# Patient Record
Sex: Female | Born: 1955
Health system: Southern US, Community
[De-identification: ages and names within clinical notes are randomized; demographics above are authoritative.]

## PROBLEM LIST (undated history)

## (undated) DIAGNOSIS — I10 Essential (primary) hypertension: Secondary | ICD-10-CM

## (undated) DIAGNOSIS — R918 Other nonspecific abnormal finding of lung field: Secondary | ICD-10-CM

## (undated) DIAGNOSIS — F1911 Other psychoactive substance abuse, in remission: Secondary | ICD-10-CM

## (undated) DIAGNOSIS — R232 Flushing: Secondary | ICD-10-CM

## (undated) DIAGNOSIS — F419 Anxiety disorder, unspecified: Secondary | ICD-10-CM

## (undated) DIAGNOSIS — C50919 Malignant neoplasm of unspecified site of unspecified female breast: Secondary | ICD-10-CM

## (undated) DIAGNOSIS — N809 Endometriosis, unspecified: Secondary | ICD-10-CM

## (undated) DIAGNOSIS — K760 Fatty (change of) liver, not elsewhere classified: Secondary | ICD-10-CM

## (undated) DIAGNOSIS — J449 Chronic obstructive pulmonary disease, unspecified: Secondary | ICD-10-CM

## (undated) DIAGNOSIS — B1920 Unspecified viral hepatitis C without hepatic coma: Secondary | ICD-10-CM

## (undated) DIAGNOSIS — T451X5A Adverse effect of antineoplastic and immunosuppressive drugs, initial encounter: Secondary | ICD-10-CM

## (undated) DIAGNOSIS — K633 Ulcer of intestine: Secondary | ICD-10-CM

## (undated) DIAGNOSIS — G629 Polyneuropathy, unspecified: Secondary | ICD-10-CM

## (undated) DIAGNOSIS — F32A Depression, unspecified: Secondary | ICD-10-CM

## (undated) DIAGNOSIS — Z8619 Personal history of other infectious and parasitic diseases: Secondary | ICD-10-CM

## (undated) DIAGNOSIS — J45909 Unspecified asthma, uncomplicated: Secondary | ICD-10-CM

## (undated) DIAGNOSIS — R9389 Abnormal findings on diagnostic imaging of other specified body structures: Secondary | ICD-10-CM

## (undated) HISTORY — DX: Polyneuropathy, unspecified: G62.9

## (undated) HISTORY — DX: Other nonspecific abnormal finding of lung field: R91.8

## (undated) HISTORY — DX: Chronic obstructive pulmonary disease, unspecified: J44.9

## (undated) HISTORY — DX: Other psychoactive substance abuse, in remission: F19.11

## (undated) HISTORY — DX: Endometriosis, unspecified: N80.9

## (undated) HISTORY — DX: Unspecified viral hepatitis C without hepatic coma: B19.20

## (undated) HISTORY — PX: OTHER SURGICAL HISTORY: SHX169

## (undated) HISTORY — DX: Flushing: R23.2

## (undated) HISTORY — DX: Abnormal findings on diagnostic imaging of other specified body structures: R93.89

## (undated) HISTORY — DX: Adverse effect of antineoplastic and immunosuppressive drugs, initial encounter: T45.1X5A

## (undated) HISTORY — DX: Anxiety disorder, unspecified: F41.9

## (undated) HISTORY — DX: Fatty (change of) liver, not elsewhere classified: K76.0

---

## 1994-07-08 HISTORY — PX: TUBAL LIGATION: SHX77

## 1996-07-08 DIAGNOSIS — B1920 Unspecified viral hepatitis C without hepatic coma: Secondary | ICD-10-CM

## 1996-07-08 HISTORY — DX: Unspecified viral hepatitis C without hepatic coma: B19.20

## 1999-07-09 HISTORY — PX: VAGINAL HYSTERECTOMY: SUR661

## 2011-05-27 ENCOUNTER — Ambulatory Visit: Payer: Self-pay

## 2011-05-29 ENCOUNTER — Ambulatory Visit: Payer: Self-pay

## 2011-06-14 ENCOUNTER — Ambulatory Visit: Payer: Self-pay | Admitting: Surgery

## 2011-06-18 ENCOUNTER — Ambulatory Visit: Payer: Self-pay | Admitting: Surgery

## 2011-06-18 DIAGNOSIS — C50919 Malignant neoplasm of unspecified site of unspecified female breast: Secondary | ICD-10-CM

## 2011-06-18 HISTORY — DX: Malignant neoplasm of unspecified site of unspecified female breast: C50.919

## 2011-06-25 ENCOUNTER — Ambulatory Visit: Payer: Self-pay | Admitting: Internal Medicine

## 2011-06-27 LAB — PATHOLOGY REPORT

## 2011-06-28 ENCOUNTER — Ambulatory Visit: Payer: Self-pay | Admitting: Internal Medicine

## 2011-07-09 ENCOUNTER — Ambulatory Visit: Payer: Self-pay | Admitting: Internal Medicine

## 2011-07-09 HISTORY — PX: BREAST LUMPECTOMY: SHX2

## 2011-07-15 ENCOUNTER — Ambulatory Visit: Payer: Self-pay | Admitting: Surgery

## 2011-08-02 LAB — CBC CANCER CENTER
Basophil #: 0 x10 3/mm (ref 0.0–0.1)
Eosinophil #: 0.2 x10 3/mm (ref 0.0–0.7)
Lymphocyte %: 26.5 %
MCHC: 34.5 g/dL (ref 32.0–36.0)
Neutrophil %: 65.4 %
RBC: 3.99 10*6/uL (ref 3.80–5.20)
RDW: 14.2 % (ref 11.5–14.5)

## 2011-08-02 LAB — HEPATIC FUNCTION PANEL A (ARMC)
Albumin: 4 g/dL (ref 3.4–5.0)
Bilirubin, Direct: 0.1 mg/dL (ref 0.00–0.20)
Bilirubin,Total: 0.4 mg/dL (ref 0.2–1.0)

## 2011-08-02 LAB — CREATININE, SERUM
Creatinine: 0.94 mg/dL (ref 0.60–1.30)
EGFR (African American): >60
EGFR (Non-African Amer.): >60

## 2011-08-09 ENCOUNTER — Ambulatory Visit: Payer: Self-pay | Admitting: Internal Medicine

## 2011-08-09 LAB — CBC CANCER CENTER
Basophil #: 0 x10 3/mm (ref 0.0–0.1)
Basophil %: 0.9 %
Eosinophil %: 8.1 %
HCT: 36.4 % (ref 35.0–47.0)
HGB: 12.8 g/dL (ref 12.0–16.0)
Lymphocyte #: 0.9 x10 3/mm — ABNORMAL LOW (ref 1.0–3.6)
MCH: 34.4 pg — ABNORMAL HIGH (ref 26.0–34.0)
MCV: 98 fL (ref 80–100)
Monocyte #: 0.1 x10 3/mm (ref 0.0–0.7)
Neutrophil #: 0.4 x10 3/mm — ABNORMAL LOW (ref 1.4–6.5)
Neutrophil %: 27.3 %
RBC: 3.71 10*6/uL — ABNORMAL LOW (ref 3.80–5.20)

## 2011-08-16 LAB — BASIC METABOLIC PANEL
Anion Gap: 7 (ref 7–16)
BUN: 8 mg/dL (ref 7–18)
Calcium, Total: 8.6 mg/dL (ref 8.5–10.1)
Chloride: 103 mmol/L (ref 98–107)
Co2: 31 mmol/L (ref 21–32)
Osmolality: 281 (ref 275–301)
Sodium: 141 mmol/L (ref 136–145)

## 2011-08-16 LAB — HEPATIC FUNCTION PANEL A (ARMC)
Albumin: 3.6 g/dL (ref 3.4–5.0)
Alkaline Phosphatase: 105 U/L (ref 50–136)
SGOT(AST): 18 U/L (ref 15–37)
Total Protein: 7.1 g/dL (ref 6.4–8.2)

## 2011-08-16 LAB — CBC CANCER CENTER
Basophil #: 0 x10 3/mm (ref 0.0–0.1)
Eosinophil #: 0 x10 3/mm (ref 0.0–0.7)
Lymphocyte #: 1.3 x10 3/mm (ref 1.0–3.6)
Lymphocyte %: 22.4 %
Monocyte #: 0.5 x10 3/mm (ref 0.0–0.7)
Monocyte %: 8.6 %
Neutrophil %: 67.8 %
Platelet: 261 x10 3/mm (ref 150–440)
RBC: 3.35 10*6/uL — ABNORMAL LOW (ref 3.80–5.20)
RDW: 14.3 % (ref 11.5–14.5)
WBC: 5.7 x10 3/mm (ref 3.6–11.0)

## 2011-08-23 LAB — CBC CANCER CENTER
Eosinophil #: 0 x10 3/mm (ref 0.0–0.7)
Eosinophil %: 1.1 %
Lymphocyte #: 0.6 x10 3/mm — ABNORMAL LOW (ref 1.0–3.6)
MCH: 34.5 pg — ABNORMAL HIGH (ref 26.0–34.0)
MCHC: 35.2 g/dL (ref 32.0–36.0)
MCV: 98 fL (ref 80–100)
Neutrophil #: 0.2 x10 3/mm — ABNORMAL LOW (ref 1.4–6.5)
Neutrophil %: 23.4 %
Platelet: 138 x10 3/mm — ABNORMAL LOW (ref 150–440)
RBC: 3.5 10*6/uL — ABNORMAL LOW (ref 3.80–5.20)
WBC: 1 x10 3/mm — CL (ref 3.6–11.0)

## 2011-08-30 LAB — BASIC METABOLIC PANEL
BUN: 9 mg/dL (ref 7–18)
Calcium, Total: 8.9 mg/dL (ref 8.5–10.1)
Chloride: 105 mmol/L (ref 98–107)
EGFR (Non-African Amer.): >60
Osmolality: 279 (ref 275–301)
Potassium: 3.8 mmol/L (ref 3.5–5.1)
Sodium: 140 mmol/L (ref 136–145)

## 2011-08-30 LAB — CBC CANCER CENTER
Basophil #: 0.1 x10 3/mm (ref 0.0–0.1)
Basophil %: 0.6 %
Eosinophil #: 0 x10 3/mm (ref 0.0–0.7)
Eosinophil %: 0.2 %
HCT: 32.4 % — ABNORMAL LOW (ref 35.0–47.0)
HGB: 11.3 g/dL — ABNORMAL LOW (ref 12.0–16.0)
Lymphocyte %: 15.3 %
MCH: 34.4 pg — ABNORMAL HIGH (ref 26.0–34.0)
MCHC: 34.9 g/dL (ref 32.0–36.0)
Monocyte #: 0.8 x10 3/mm — ABNORMAL HIGH (ref 0.0–0.7)
Monocyte %: 10.2 %
RDW: 15.1 % — ABNORMAL HIGH (ref 11.5–14.5)
WBC: 8.1 x10 3/mm (ref 3.6–11.0)

## 2011-08-30 LAB — HEPATIC FUNCTION PANEL A (ARMC)
Bilirubin, Direct: 0 mg/dL (ref 0.00–0.20)
Bilirubin,Total: 0.2 mg/dL (ref 0.2–1.0)
SGOT(AST): 21 U/L (ref 15–37)
Total Protein: 7.5 g/dL (ref 6.4–8.2)

## 2011-09-06 ENCOUNTER — Ambulatory Visit: Payer: Self-pay | Admitting: Internal Medicine

## 2011-09-06 LAB — CBC CANCER CENTER
Basophil #: 0 x10 3/mm (ref 0.0–0.1)
Basophil %: 0.2 %
Eosinophil #: 0 x10 3/mm (ref 0.0–0.7)
Eosinophil %: 0.3 %
HCT: 36.5 % (ref 35.0–47.0)
HGB: 12.6 g/dL (ref 12.0–16.0)
Lymphocyte %: 16.9 %
MCV: 101 fL — ABNORMAL HIGH (ref 80–100)
Monocyte %: 9.8 %
Neutrophil #: 5.9 x10 3/mm (ref 1.4–6.5)
RBC: 3.6 10*6/uL — ABNORMAL LOW (ref 3.80–5.20)
RDW: 18.9 % — ABNORMAL HIGH (ref 11.5–14.5)
WBC: 8.1 x10 3/mm (ref 3.6–11.0)

## 2011-09-13 LAB — CBC CANCER CENTER
Basophil #: 0 x10 3/mm (ref 0.0–0.1)
Basophil %: 0.6 %
Eosinophil #: 0 x10 3/mm (ref 0.0–0.7)
HCT: 32.1 % — ABNORMAL LOW (ref 35.0–47.0)
HGB: 11.3 g/dL — ABNORMAL LOW (ref 12.0–16.0)
Lymphocyte #: 0.5 x10 3/mm — ABNORMAL LOW (ref 1.0–3.6)
Lymphocyte %: 26.7 %
MCHC: 35.2 g/dL (ref 32.0–36.0)
Monocyte %: 11.4 %
Neutrophil #: 1.1 x10 3/mm — ABNORMAL LOW (ref 1.4–6.5)
WBC: 1.8 x10 3/mm — CL (ref 3.6–11.0)

## 2011-09-18 LAB — CBC CANCER CENTER
Basophil #: 0.1 x10 3/mm (ref 0.0–0.1)
Eosinophil #: 0.1 x10 3/mm (ref 0.0–0.7)
HCT: 31.5 % — ABNORMAL LOW (ref 35.0–47.0)
HGB: 11.1 g/dL — ABNORMAL LOW (ref 12.0–16.0)
Lymphocyte #: 0.8 x10 3/mm — ABNORMAL LOW (ref 1.0–3.6)
Lymphocyte %: 11.5 %
MCHC: 35.4 g/dL (ref 32.0–36.0)
Monocyte #: 0.6 x10 3/mm (ref 0.0–0.7)
Monocyte %: 9.4 %
Neutrophil #: 5.2 x10 3/mm (ref 1.4–6.5)
Neutrophil %: 76.9 %
RBC: 3.08 10*6/uL — ABNORMAL LOW (ref 3.80–5.20)
RDW: 17.8 % — ABNORMAL HIGH (ref 11.5–14.5)
WBC: 6.7 x10 3/mm (ref 3.6–11.0)

## 2011-09-27 LAB — BASIC METABOLIC PANEL
Anion Gap: 7 (ref 7–16)
Calcium, Total: 8.8 mg/dL (ref 8.5–10.1)
Chloride: 105 mmol/L (ref 98–107)
Co2: 29 mmol/L (ref 21–32)
Creatinine: 0.91 mg/dL (ref 0.60–1.30)
EGFR (African American): >60
EGFR (Non-African Amer.): >60
Glucose: 100 mg/dL — ABNORMAL HIGH (ref 65–99)
Osmolality: 280 (ref 275–301)
Potassium: 4 mmol/L (ref 3.5–5.1)
Sodium: 141 mmol/L (ref 136–145)

## 2011-09-27 LAB — CBC CANCER CENTER
Basophil #: 0 x10 3/mm (ref 0.0–0.1)
Eosinophil %: 0.9 %
HCT: 33.3 % — ABNORMAL LOW (ref 35.0–47.0)
HGB: 11.6 g/dL — ABNORMAL LOW (ref 12.0–16.0)
Lymphocyte #: 0.9 x10 3/mm — ABNORMAL LOW (ref 1.0–3.6)
Lymphocyte %: 14.8 %
MCH: 36.3 pg — ABNORMAL HIGH (ref 26.0–34.0)
MCHC: 34.8 g/dL (ref 32.0–36.0)
Monocyte #: 0.9 x10 3/mm — ABNORMAL HIGH (ref 0.0–0.7)
Neutrophil %: 69.6 %
Platelet: 297 x10 3/mm (ref 150–440)
RBC: 3.19 10*6/uL — ABNORMAL LOW (ref 3.80–5.20)
RDW: 19.7 % — ABNORMAL HIGH (ref 11.5–14.5)

## 2011-10-03 LAB — CBC CANCER CENTER
Basophil #: 0 x10 3/mm (ref 0.0–0.1)
Eosinophil #: 0 x10 3/mm (ref 0.0–0.7)
HGB: 11.8 g/dL — ABNORMAL LOW (ref 12.0–16.0)
Lymphocyte %: 19.9 %
MCH: 36.8 pg — ABNORMAL HIGH (ref 26.0–34.0)
MCHC: 35 g/dL (ref 32.0–36.0)
Monocyte #: 0 x10 3/mm (ref 0.0–0.7)
Monocyte %: 1 %
Neutrophil #: 1.8 x10 3/mm (ref 1.4–6.5)
Neutrophil %: 78.2 %
Platelet: 98 x10 3/mm — ABNORMAL LOW (ref 150–440)
RDW: 17.7 % — ABNORMAL HIGH (ref 11.5–14.5)

## 2011-10-07 ENCOUNTER — Ambulatory Visit: Payer: Self-pay | Admitting: Internal Medicine

## 2011-10-11 LAB — CBC CANCER CENTER
Basophil %: 0.2 %
Eosinophil #: 0 x10 3/mm (ref 0.0–0.7)
Eosinophil %: 1 %
HCT: 28.5 % — ABNORMAL LOW (ref 35.0–47.0)
MCHC: 35.1 g/dL (ref 32.0–36.0)
Neutrophil #: 3.1 x10 3/mm (ref 1.4–6.5)
Neutrophil %: 71.1 %
WBC: 4.4 x10 3/mm (ref 3.6–11.0)

## 2011-10-18 LAB — CBC CANCER CENTER
Basophil %: 0.6 %
Eosinophil #: 0 x10 3/mm (ref 0.0–0.7)
Eosinophil %: 0.5 %
MCHC: 32.9 g/dL (ref 32.0–36.0)
Monocyte #: 0.6 x10 3/mm (ref 0.2–0.9)
Monocyte %: 12.7 %
Platelet: 307 x10 3/mm (ref 150–440)
RDW: 18.6 % — ABNORMAL HIGH (ref 11.5–14.5)

## 2011-10-18 LAB — BASIC METABOLIC PANEL
Anion Gap: 6 — ABNORMAL LOW (ref 7–16)
Co2: 31 mmol/L (ref 21–32)
Creatinine: 0.84 mg/dL (ref 0.60–1.30)
Glucose: 148 mg/dL — ABNORMAL HIGH (ref 65–99)
Osmolality: 282 (ref 275–301)
Sodium: 141 mmol/L (ref 136–145)

## 2011-11-01 LAB — CBC CANCER CENTER
Eosinophil #: 0 x10 3/mm (ref 0.0–0.7)
Eosinophil %: 0 %
HCT: 37.8 % (ref 35.0–47.0)
Lymphocyte #: 0.3 x10 3/mm — ABNORMAL LOW (ref 1.0–3.6)
MCHC: 33.2 g/dL (ref 32.0–36.0)
Monocyte %: 1.2 %
Neutrophil #: 8.4 x10 3/mm — ABNORMAL HIGH (ref 1.4–6.5)
RDW: 15.1 % — ABNORMAL HIGH (ref 11.5–14.5)
WBC: 8.8 x10 3/mm (ref 3.6–11.0)

## 2011-11-06 ENCOUNTER — Ambulatory Visit: Payer: Self-pay | Admitting: Internal Medicine

## 2011-11-08 LAB — BASIC METABOLIC PANEL
Anion Gap: 6 — ABNORMAL LOW (ref 7–16)
BUN: 7 mg/dL (ref 7–18)
Chloride: 103 mmol/L (ref 98–107)
Co2: 32 mmol/L (ref 21–32)
Osmolality: 282 (ref 275–301)
Potassium: 4.2 mmol/L (ref 3.5–5.1)
Sodium: 141 mmol/L (ref 136–145)

## 2011-11-08 LAB — CBC CANCER CENTER
Basophil #: 0 x10 3/mm (ref 0.0–0.1)
Basophil %: 1 %
Eosinophil #: 0.1 x10 3/mm (ref 0.0–0.7)
Eosinophil %: 3.8 %
HGB: 12.6 g/dL (ref 12.0–16.0)
Lymphocyte %: 18 %
MCV: 109 fL — ABNORMAL HIGH (ref 80–100)
Monocyte #: 0.3 x10 3/mm (ref 0.2–0.9)
Neutrophil #: 2.5 x10 3/mm (ref 1.4–6.5)
Neutrophil %: 69.3 %
RBC: 3.4 10*6/uL — ABNORMAL LOW (ref 3.80–5.20)
WBC: 3.6 x10 3/mm (ref 3.6–11.0)

## 2011-11-15 LAB — CBC CANCER CENTER
Eosinophil #: 0.1 x10 3/mm (ref 0.0–0.7)
Eosinophil %: 2.7 %
HCT: 36.6 % (ref 35.0–47.0)
HGB: 12.3 g/dL (ref 12.0–16.0)
Lymphocyte #: 0.4 x10 3/mm — ABNORMAL LOW (ref 1.0–3.6)
MCH: 36.6 pg — ABNORMAL HIGH (ref 26.0–34.0)
MCV: 109 fL — ABNORMAL HIGH (ref 80–100)
Monocyte #: 0.1 x10 3/mm — ABNORMAL LOW (ref 0.2–0.9)
Monocyte %: 4.2 %
Neutrophil #: 2.9 x10 3/mm (ref 1.4–6.5)
Neutrophil %: 81.6 %
RBC: 3.36 10*6/uL — ABNORMAL LOW (ref 3.80–5.20)

## 2011-11-25 LAB — CBC CANCER CENTER
Basophil #: 0 x10 3/mm (ref 0.0–0.1)
Eosinophil %: 2.3 %
HGB: 11.8 g/dL — ABNORMAL LOW (ref 12.0–16.0)
Lymphocyte %: 20.9 %
MCHC: 33.3 g/dL (ref 32.0–36.0)
Monocyte #: 0.8 x10 3/mm (ref 0.2–0.9)
Monocyte %: 23.5 %
Neutrophil #: 1.8 x10 3/mm (ref 1.4–6.5)
Neutrophil %: 52.7 %
Platelet: 244 x10 3/mm (ref 150–440)
RBC: 3.3 10*6/uL — ABNORMAL LOW (ref 3.80–5.20)
WBC: 3.5 x10 3/mm — ABNORMAL LOW (ref 3.6–11.0)

## 2011-12-06 LAB — CBC CANCER CENTER
Basophil #: 0 x10 3/mm (ref 0.0–0.1)
Eosinophil #: 0.1 x10 3/mm (ref 0.0–0.7)
Eosinophil %: 1.6 %
HCT: 35.4 % (ref 35.0–47.0)
Lymphocyte #: 0.8 x10 3/mm — ABNORMAL LOW (ref 1.0–3.6)
MCH: 36 pg — ABNORMAL HIGH (ref 26.0–34.0)
MCHC: 33.7 g/dL (ref 32.0–36.0)
MCV: 107 fL — ABNORMAL HIGH (ref 80–100)
Monocyte #: 0.7 x10 3/mm (ref 0.2–0.9)
Monocyte %: 15.7 %
Neutrophil #: 3.1 x10 3/mm (ref 1.4–6.5)
Neutrophil %: 64.9 %
Platelet: 282 x10 3/mm (ref 150–440)
RBC: 3.32 10*6/uL — ABNORMAL LOW (ref 3.80–5.20)
RDW: 13.7 % (ref 11.5–14.5)
WBC: 4.8 x10 3/mm (ref 3.6–11.0)

## 2011-12-06 LAB — COMPREHENSIVE METABOLIC PANEL
Albumin: 3.5 g/dL (ref 3.4–5.0)
Alkaline Phosphatase: 70 U/L (ref 50–136)
Anion Gap: 8 (ref 7–16)
Bilirubin,Total: 0.3 mg/dL (ref 0.2–1.0)
Calcium, Total: 9.5 mg/dL (ref 8.5–10.1)
Chloride: 98 mmol/L (ref 98–107)
Creatinine: 0.89 mg/dL (ref 0.60–1.30)
EGFR (African American): >60
EGFR (Non-African Amer.): >60
Sodium: 136 mmol/L (ref 136–145)
Total Protein: 7.4 g/dL (ref 6.4–8.2)

## 2011-12-07 ENCOUNTER — Ambulatory Visit: Payer: Self-pay | Admitting: Internal Medicine

## 2011-12-13 LAB — CBC CANCER CENTER
Basophil %: 2.5 %
Eosinophil #: 0.1 x10 3/mm (ref 0.0–0.7)
HCT: 37.1 % (ref 35.0–47.0)
HGB: 12.5 g/dL (ref 12.0–16.0)
Lymphocyte #: 0.7 x10 3/mm — ABNORMAL LOW (ref 1.0–3.6)
MCV: 107 fL — ABNORMAL HIGH (ref 80–100)
Monocyte #: 0.4 x10 3/mm (ref 0.2–0.9)
Monocyte %: 9.8 %
Neutrophil %: 67.3 %
RBC: 3.49 10*6/uL — ABNORMAL LOW (ref 3.80–5.20)
WBC: 4.1 x10 3/mm (ref 3.6–11.0)

## 2011-12-20 LAB — CBC CANCER CENTER
Basophil #: 0 x10 3/mm (ref 0.0–0.1)
Basophil %: 0.7 %
Eosinophil #: 0.1 x10 3/mm (ref 0.0–0.7)
HCT: 37.4 % (ref 35.0–47.0)
HGB: 12.5 g/dL (ref 12.0–16.0)
Lymphocyte #: 0.7 x10 3/mm — ABNORMAL LOW (ref 1.0–3.6)
Lymphocyte %: 18.4 %
MCH: 35.7 pg — ABNORMAL HIGH (ref 26.0–34.0)
MCHC: 33.4 g/dL (ref 32.0–36.0)
MCV: 107 fL — ABNORMAL HIGH (ref 80–100)
Monocyte %: 6.8 %
Neutrophil #: 2.9 x10 3/mm (ref 1.4–6.5)
Neutrophil %: 72.6 %
Platelet: 228 x10 3/mm (ref 150–440)
RBC: 3.51 10*6/uL — ABNORMAL LOW (ref 3.80–5.20)
RDW: 14 % (ref 11.5–14.5)

## 2011-12-27 LAB — CBC CANCER CENTER
Basophil #: 0 x10 3/mm (ref 0.0–0.1)
Eosinophil %: 1.2 %
HCT: 36.8 % (ref 35.0–47.0)
Lymphocyte #: 0.9 x10 3/mm — ABNORMAL LOW (ref 1.0–3.6)
Lymphocyte %: 18.9 %
MCH: 35.6 pg — ABNORMAL HIGH (ref 26.0–34.0)
MCV: 106 fL — ABNORMAL HIGH (ref 80–100)
Monocyte #: 0.5 x10 3/mm (ref 0.2–0.9)
Neutrophil #: 3.3 x10 3/mm (ref 1.4–6.5)
Platelet: 232 x10 3/mm (ref 150–440)
RBC: 3.46 10*6/uL — ABNORMAL LOW (ref 3.80–5.20)
WBC: 4.8 x10 3/mm (ref 3.6–11.0)

## 2011-12-27 LAB — BASIC METABOLIC PANEL
Calcium, Total: 9.4 mg/dL (ref 8.5–10.1)
Chloride: 102 mmol/L (ref 98–107)
Co2: 28 mmol/L (ref 21–32)
EGFR (Non-African Amer.): >60
Glucose: 110 mg/dL — ABNORMAL HIGH (ref 65–99)

## 2011-12-27 LAB — HEPATIC FUNCTION PANEL A (ARMC)
Albumin: 3.8 g/dL (ref 3.4–5.0)
Alkaline Phosphatase: 85 U/L (ref 50–136)
Bilirubin, Direct: 0.1 mg/dL (ref 0.00–0.20)
Bilirubin,Total: 0.3 mg/dL (ref 0.2–1.0)

## 2012-01-03 LAB — CBC CANCER CENTER
Basophil %: 0.7 %
Eosinophil #: 0 x10 3/mm (ref 0.0–0.7)
HCT: 35.9 % (ref 35.0–47.0)
HGB: 12.2 g/dL (ref 12.0–16.0)
Lymphocyte %: 20.4 %
MCHC: 33.9 g/dL (ref 32.0–36.0)
MCV: 105 fL — ABNORMAL HIGH (ref 80–100)
Monocyte #: 0.4 x10 3/mm (ref 0.2–0.9)
Monocyte %: 8.3 %
Neutrophil #: 3.7 x10 3/mm (ref 1.4–6.5)
RBC: 3.4 10*6/uL — ABNORMAL LOW (ref 3.80–5.20)
RDW: 15.1 % — ABNORMAL HIGH (ref 11.5–14.5)
WBC: 5.4 x10 3/mm (ref 3.6–11.0)

## 2012-01-06 ENCOUNTER — Ambulatory Visit: Payer: Self-pay | Admitting: Internal Medicine

## 2012-01-13 LAB — CBC CANCER CENTER
Basophil %: 0.4 %
Eosinophil %: 0.4 %
HCT: 32.8 % — ABNORMAL LOW (ref 35.0–47.0)
HGB: 11.3 g/dL — ABNORMAL LOW (ref 12.0–16.0)
Lymphocyte #: 0.7 x10 3/mm — ABNORMAL LOW (ref 1.0–3.6)
MCH: 37.2 pg — ABNORMAL HIGH (ref 26.0–34.0)
MCHC: 34.4 g/dL (ref 32.0–36.0)
MCV: 108 fL — ABNORMAL HIGH (ref 80–100)
Monocyte #: 0.5 x10 3/mm (ref 0.2–0.9)
Monocyte %: 18 %
Neutrophil #: 1.6 x10 3/mm (ref 1.4–6.5)
Platelet: 242 x10 3/mm (ref 150–440)
RBC: 3.04 10*6/uL — ABNORMAL LOW (ref 3.80–5.20)
WBC: 2.8 x10 3/mm — ABNORMAL LOW (ref 3.6–11.0)

## 2012-01-20 LAB — CBC CANCER CENTER
Basophil #: 0 x10 3/mm (ref 0.0–0.1)
Basophil %: 0.7 %
Eosinophil #: 0.1 x10 3/mm (ref 0.0–0.7)
Eosinophil %: 1.2 %
HGB: 12.4 g/dL (ref 12.0–16.0)
Lymphocyte #: 1.1 x10 3/mm (ref 1.0–3.6)
Lymphocyte %: 19.9 %
MCHC: 33.7 g/dL (ref 32.0–36.0)
MCV: 109 fL — ABNORMAL HIGH (ref 80–100)
Monocyte %: 8.9 %
Neutrophil %: 69.3 %
RBC: 3.38 10*6/uL — ABNORMAL LOW (ref 3.80–5.20)
WBC: 5.3 x10 3/mm (ref 3.6–11.0)

## 2012-01-20 LAB — HEPATIC FUNCTION PANEL A (ARMC)
Albumin: 3.6 g/dL (ref 3.4–5.0)
Bilirubin,Total: 0.2 mg/dL (ref 0.2–1.0)
SGOT(AST): 31 U/L (ref 15–37)
SGPT (ALT): 40 U/L
Total Protein: 7.2 g/dL (ref 6.4–8.2)

## 2012-02-06 ENCOUNTER — Ambulatory Visit: Payer: Self-pay | Admitting: Internal Medicine

## 2012-02-19 LAB — CBC CANCER CENTER
Basophil %: 0.9 %
Eosinophil #: 0.2 x10 3/mm (ref 0.0–0.7)
Eosinophil %: 2.1 %
Lymphocyte %: 13.6 %
MCH: 37.1 pg — ABNORMAL HIGH (ref 26.0–34.0)
MCHC: 34 g/dL (ref 32.0–36.0)
MCV: 109 fL — ABNORMAL HIGH (ref 80–100)
Monocyte %: 8.6 %
Neutrophil #: 5.8 x10 3/mm (ref 1.4–6.5)
Neutrophil %: 74.8 %
Platelet: 295 x10 3/mm (ref 150–440)
RDW: 15.5 % — ABNORMAL HIGH (ref 11.5–14.5)
WBC: 7.7 x10 3/mm (ref 3.6–11.0)

## 2012-02-19 LAB — COMPREHENSIVE METABOLIC PANEL
Albumin: 3.9 g/dL (ref 3.4–5.0)
Anion Gap: 6 — ABNORMAL LOW (ref 7–16)
BUN: 11 mg/dL (ref 7–18)
Chloride: 104 mmol/L (ref 98–107)
Creatinine: 0.9 mg/dL (ref 0.60–1.30)
EGFR (African American): >60
EGFR (Non-African Amer.): >60
Osmolality: 276 (ref 275–301)
Potassium: 4.3 mmol/L (ref 3.5–5.1)
SGOT(AST): 31 U/L (ref 15–37)
Sodium: 138 mmol/L (ref 136–145)
Total Protein: 7.9 g/dL (ref 6.4–8.2)

## 2012-02-25 LAB — CBC CANCER CENTER
Basophil #: 0.1 x10 3/mm (ref 0.0–0.1)
Basophil %: 1.3 %
Eosinophil %: 2.3 %
HCT: 41.7 % (ref 35.0–47.0)
HGB: 14.2 g/dL (ref 12.0–16.0)
Lymphocyte %: 13.1 %
MCH: 37.6 pg — ABNORMAL HIGH (ref 26.0–34.0)
MCHC: 34.1 g/dL (ref 32.0–36.0)
Monocyte %: 6.7 %
Neutrophil %: 76.6 %
Platelet: 249 x10 3/mm (ref 150–440)
RBC: 3.78 10*6/uL — ABNORMAL LOW (ref 3.80–5.20)
RDW: 14.9 % — ABNORMAL HIGH (ref 11.5–14.5)

## 2012-03-03 LAB — CBC CANCER CENTER
Basophil %: 0.7 %
Eosinophil %: 2.1 %
HCT: 39.3 % (ref 35.0–47.0)
HGB: 13.5 g/dL (ref 12.0–16.0)
Lymphocyte #: 1.1 x10 3/mm (ref 1.0–3.6)
MCH: 37.4 pg — ABNORMAL HIGH (ref 26.0–34.0)
MCHC: 34.4 g/dL (ref 32.0–36.0)
MCV: 109 fL — ABNORMAL HIGH (ref 80–100)
Monocyte #: 0.6 x10 3/mm (ref 0.2–0.9)
Monocyte %: 8.7 %
Neutrophil #: 5.4 x10 3/mm (ref 1.4–6.5)
Neutrophil %: 73.8 %
RBC: 3.61 10*6/uL — ABNORMAL LOW (ref 3.80–5.20)

## 2012-03-08 ENCOUNTER — Ambulatory Visit: Payer: Self-pay | Admitting: Internal Medicine

## 2012-03-10 LAB — CBC CANCER CENTER
Eosinophil #: 0.2 x10 3/mm (ref 0.0–0.7)
Eosinophil %: 3 %
HCT: 42.2 % (ref 35.0–47.0)
HGB: 14.7 g/dL (ref 12.0–16.0)
Lymphocyte #: 0.9 x10 3/mm — ABNORMAL LOW (ref 1.0–3.6)
MCHC: 34.7 g/dL (ref 32.0–36.0)
MCV: 110 fL — ABNORMAL HIGH (ref 80–100)
Monocyte #: 0.6 x10 3/mm (ref 0.2–0.9)
Monocyte %: 10.2 %
Neutrophil #: 4.2 x10 3/mm (ref 1.4–6.5)
Neutrophil %: 70.9 %
Platelet: 226 x10 3/mm (ref 150–440)
RDW: 13.8 % (ref 11.5–14.5)

## 2012-03-17 LAB — CBC CANCER CENTER
Eosinophil #: 0.1 x10 3/mm (ref 0.0–0.7)
HCT: 42.2 % (ref 35.0–47.0)
Lymphocyte %: 12.6 %
MCH: 37.1 pg — ABNORMAL HIGH (ref 26.0–34.0)
MCHC: 34.1 g/dL (ref 32.0–36.0)
Monocyte %: 7.1 %
Neutrophil #: 6.6 x10 3/mm — ABNORMAL HIGH (ref 1.4–6.5)
Platelet: 205 x10 3/mm (ref 150–440)
RDW: 13.2 % (ref 11.5–14.5)
WBC: 8.4 x10 3/mm (ref 3.6–11.0)

## 2012-03-25 LAB — CBC CANCER CENTER
Basophil %: 0.4 %
Eosinophil %: 2.2 %
HCT: 42.4 % (ref 35.0–47.0)
HGB: 13.8 g/dL (ref 12.0–16.0)
Lymphocyte #: 1 x10 3/mm (ref 1.0–3.6)
Lymphocyte %: 17.9 %
MCH: 36 pg — ABNORMAL HIGH (ref 26.0–34.0)
MCV: 111 fL — ABNORMAL HIGH (ref 80–100)
Monocyte #: 0.6 x10 3/mm (ref 0.2–0.9)
Monocyte %: 9.9 %
Neutrophil #: 3.9 x10 3/mm (ref 1.4–6.5)
Platelet: 200 x10 3/mm (ref 150–440)
RBC: 3.83 10*6/uL (ref 3.80–5.20)
WBC: 5.6 x10 3/mm (ref 3.6–11.0)

## 2012-03-31 LAB — CBC CANCER CENTER
Basophil %: 0.3 %
Eosinophil #: 0.1 x10 3/mm (ref 0.0–0.7)
Eosinophil %: 1.8 %
HCT: 40.5 % (ref 35.0–47.0)
HGB: 13.3 g/dL (ref 12.0–16.0)
MCH: 36.5 pg — ABNORMAL HIGH (ref 26.0–34.0)
MCV: 111 fL — ABNORMAL HIGH (ref 80–100)
Monocyte #: 0.7 x10 3/mm (ref 0.2–0.9)
Neutrophil #: 5.8 x10 3/mm (ref 1.4–6.5)
Neutrophil %: 76.8 %
Platelet: 177 x10 3/mm (ref 150–440)
RBC: 3.64 10*6/uL — ABNORMAL LOW (ref 3.80–5.20)

## 2012-04-07 ENCOUNTER — Ambulatory Visit: Payer: Self-pay | Admitting: Internal Medicine

## 2012-04-07 LAB — CBC CANCER CENTER
Eosinophil #: 0.1 x10 3/mm (ref 0.0–0.7)
Eosinophil %: 1.7 %
Lymphocyte #: 0.7 x10 3/mm — ABNORMAL LOW (ref 1.0–3.6)
Lymphocyte %: 12.2 %
MCH: 35.9 pg — ABNORMAL HIGH (ref 26.0–34.0)
MCHC: 32.8 g/dL (ref 32.0–36.0)
Monocyte #: 0.5 x10 3/mm (ref 0.2–0.9)
Monocyte %: 8.7 %
Neutrophil %: 77.2 %
Platelet: 210 x10 3/mm (ref 150–440)
RBC: 4.07 10*6/uL (ref 3.80–5.20)
RDW: 13.1 % (ref 11.5–14.5)

## 2012-05-08 ENCOUNTER — Ambulatory Visit: Payer: Self-pay | Admitting: Radiation Oncology

## 2012-05-08 ENCOUNTER — Ambulatory Visit: Payer: Self-pay | Admitting: Internal Medicine

## 2012-06-07 ENCOUNTER — Ambulatory Visit: Payer: Self-pay | Admitting: Internal Medicine

## 2012-06-09 LAB — CBC CANCER CENTER
Basophil #: 0 x10 3/mm (ref 0.0–0.1)
Basophil %: 0.4 %
Eosinophil #: 0.1 x10 3/mm (ref 0.0–0.7)
Eosinophil %: 2.4 %
HCT: 42.1 % (ref 35.0–47.0)
HGB: 14.4 g/dL (ref 12.0–16.0)
Lymphocyte #: 0.8 x10 3/mm — ABNORMAL LOW (ref 1.0–3.6)
Lymphocyte %: 13.9 %
MCH: 36.3 pg — ABNORMAL HIGH (ref 26.0–34.0)
MCHC: 34.3 g/dL (ref 32.0–36.0)
MCV: 106 fL — ABNORMAL HIGH (ref 80–100)
Monocyte #: 0.4 x10 3/mm (ref 0.2–0.9)
Monocyte %: 6.9 %
Neutrophil #: 4.3 x10 3/mm (ref 1.4–6.5)
Neutrophil %: 76.4 %
Platelet: 199 x10 3/mm (ref 150–440)
RBC: 3.98 10*6/uL (ref 3.80–5.20)
RDW: 13.6 % (ref 11.5–14.5)
WBC: 5.6 x10 3/mm (ref 3.6–11.0)

## 2012-06-09 LAB — HEPATIC FUNCTION PANEL A (ARMC)
Albumin: 3.7 g/dL (ref 3.4–5.0)
Alkaline Phosphatase: 99 U/L (ref 50–136)
SGOT(AST): 25 U/L (ref 15–37)

## 2012-06-09 LAB — CALCIUM: Calcium, Total: 9.8 mg/dL (ref 8.5–10.1)

## 2012-06-10 LAB — CANCER ANTIGEN 27.29: CA 27.29: 44.8 U/mL — ABNORMAL HIGH (ref 0.0–38.6)

## 2012-07-08 ENCOUNTER — Ambulatory Visit: Payer: Self-pay | Admitting: Internal Medicine

## 2012-07-25 LAB — CANCER ANTIGEN 27.29: CA 27.29: 37.4 U/mL (ref 0.0–38.6)

## 2012-08-08 ENCOUNTER — Ambulatory Visit: Payer: Self-pay | Admitting: Internal Medicine

## 2012-09-05 ENCOUNTER — Ambulatory Visit: Payer: Self-pay | Admitting: Internal Medicine

## 2012-09-15 LAB — CREATININE, SERUM: EGFR (African American): >60

## 2012-09-15 LAB — CBC CANCER CENTER
Basophil #: 0 x10 3/mm (ref 0.0–0.1)
Basophil %: 0.6 %
Eosinophil #: 0.2 x10 3/mm (ref 0.0–0.7)
HCT: 39.8 % (ref 35.0–47.0)
HGB: 13.5 g/dL (ref 12.0–16.0)
Lymphocyte #: 1 x10 3/mm (ref 1.0–3.6)
Lymphocyte %: 13.5 %
MCH: 34.9 pg — ABNORMAL HIGH (ref 26.0–34.0)
Monocyte #: 0.7 x10 3/mm (ref 0.2–0.9)
Monocyte %: 10.1 %
RDW: 13 % (ref 11.5–14.5)
WBC: 7.2 x10 3/mm (ref 3.6–11.0)

## 2012-09-15 LAB — HEPATIC FUNCTION PANEL A (ARMC)
Albumin: 3.7 g/dL (ref 3.4–5.0)
Alkaline Phosphatase: 101 U/L (ref 50–136)
Bilirubin, Direct: 0.1 mg/dL (ref 0.00–0.20)
Bilirubin,Total: 0.4 mg/dL (ref 0.2–1.0)
SGOT(AST): 27 U/L (ref 15–37)
SGPT (ALT): 22 U/L (ref 12–78)
Total Protein: 8.4 g/dL — ABNORMAL HIGH (ref 6.4–8.2)

## 2012-09-16 LAB — CANCER ANTIGEN 27.29: CA 27.29: 33.1 U/mL (ref 0.0–38.6)

## 2012-10-06 ENCOUNTER — Ambulatory Visit: Payer: Self-pay | Admitting: Internal Medicine

## 2012-10-19 ENCOUNTER — Inpatient Hospital Stay: Payer: Self-pay | Admitting: Internal Medicine

## 2012-10-19 LAB — COMPREHENSIVE METABOLIC PANEL
Albumin: 4.1 g/dL (ref 3.4–5.0)
Alkaline Phosphatase: 108 U/L (ref 50–136)
Anion Gap: 11 (ref 7–16)
BUN: 24 mg/dL — ABNORMAL HIGH (ref 7–18)
Chloride: 96 mmol/L — ABNORMAL LOW (ref 98–107)
Creatinine: 0.92 mg/dL (ref 0.60–1.30)
EGFR (Non-African Amer.): >60
Osmolality: 272 (ref 275–301)
Potassium: 3.6 mmol/L (ref 3.5–5.1)
Sodium: 134 mmol/L — ABNORMAL LOW (ref 136–145)

## 2012-10-19 LAB — URINALYSIS, COMPLETE
Bilirubin,UR: NEGATIVE
Glucose,UR: NEGATIVE mg/dL (ref 0–75)
Ph: 6 (ref 4.5–8.0)
RBC,UR: 1 /HPF (ref 0–5)

## 2012-10-19 LAB — ETHANOL: Ethanol: <3 mg/dL

## 2012-10-19 LAB — LIPASE, BLOOD: Lipase: 266 U/L (ref 73–393)

## 2012-10-19 LAB — DRUG SCREEN, URINE
Amphetamines, Ur Screen: NEGATIVE (ref ?–1000)
Benzodiazepine, Ur Scrn: NEGATIVE (ref ?–200)
Cocaine Metabolite,Ur ~~LOC~~: POSITIVE (ref ?–300)
MDMA (Ecstasy)Ur Screen: NEGATIVE (ref ?–500)
Tricyclic, Ur Screen: NEGATIVE (ref ?–1000)

## 2012-10-19 LAB — CBC
HCT: 53.5 % — ABNORMAL HIGH (ref 35.0–47.0)
MCH: 34.3 pg — ABNORMAL HIGH (ref 26.0–34.0)
MCHC: 34 g/dL (ref 32.0–36.0)
MCV: 101 fL — ABNORMAL HIGH (ref 80–100)
RBC: 5.3 10*6/uL — ABNORMAL HIGH (ref 3.80–5.20)
WBC: 14.5 10*3/uL — ABNORMAL HIGH (ref 3.6–11.0)

## 2012-10-19 LAB — TSH: Thyroid Stimulating Horm: 1.27 u[IU]/mL

## 2012-10-20 LAB — CBC WITH DIFFERENTIAL/PLATELET
Basophil #: 0.1 10*3/uL (ref 0.0–0.1)
Basophil %: 0.6 %
Eosinophil #: 0 10*3/uL (ref 0.0–0.7)
Eosinophil %: 0.4 %
HGB: 15 g/dL (ref 12.0–16.0)
Lymphocyte %: 10.5 %
MCH: 34.3 pg — ABNORMAL HIGH (ref 26.0–34.0)
MCHC: 33.8 g/dL (ref 32.0–36.0)
Monocyte #: 1.1 x10 3/mm — ABNORMAL HIGH (ref 0.2–0.9)
Platelet: 292 10*3/uL (ref 150–440)

## 2012-10-20 LAB — BASIC METABOLIC PANEL
Chloride: 105 mmol/L (ref 98–107)
Co2: 26 mmol/L (ref 21–32)
EGFR (Non-African Amer.): >60
Osmolality: 278 (ref 275–301)
Sodium: 138 mmol/L (ref 136–145)

## 2012-10-21 LAB — BASIC METABOLIC PANEL
Anion Gap: 8 (ref 7–16)
Chloride: 102 mmol/L (ref 98–107)
Co2: 24 mmol/L (ref 21–32)
Glucose: 81 mg/dL (ref 65–99)
Osmolality: 268 (ref 275–301)
Potassium: 3.6 mmol/L (ref 3.5–5.1)
Sodium: 134 mmol/L — ABNORMAL LOW (ref 136–145)

## 2012-10-21 LAB — URINE CULTURE

## 2012-11-05 ENCOUNTER — Ambulatory Visit: Payer: Self-pay | Admitting: Internal Medicine

## 2012-11-23 LAB — CBC CANCER CENTER
Eosinophil #: 0.1 x10 3/mm (ref 0.0–0.7)
Eosinophil %: 1.9 %
HGB: 13.4 g/dL (ref 12.0–16.0)
Lymphocyte #: 0.8 x10 3/mm — ABNORMAL LOW (ref 1.0–3.6)
Lymphocyte %: 13.4 %
MCHC: 33.5 g/dL (ref 32.0–36.0)
Monocyte #: 0.4 x10 3/mm (ref 0.2–0.9)
Monocyte %: 6.5 %
Neutrophil %: 77.7 %
Platelet: 242 x10 3/mm (ref 150–440)
RBC: 3.96 10*6/uL (ref 3.80–5.20)

## 2012-11-23 LAB — HEPATIC FUNCTION PANEL A (ARMC)
Albumin: 3.4 g/dL (ref 3.4–5.0)
Bilirubin,Total: 0.3 mg/dL (ref 0.2–1.0)
SGOT(AST): 22 U/L (ref 15–37)
SGPT (ALT): 21 U/L (ref 12–78)
Total Protein: 7.6 g/dL (ref 6.4–8.2)

## 2012-11-23 LAB — CREATININE, SERUM
Creatinine: 1.04 mg/dL (ref 0.60–1.30)
EGFR (Non-African Amer.): >60

## 2012-12-02 ENCOUNTER — Ambulatory Visit: Payer: Self-pay | Admitting: Internal Medicine

## 2012-12-06 ENCOUNTER — Ambulatory Visit: Payer: Self-pay | Admitting: Internal Medicine

## 2013-01-11 ENCOUNTER — Ambulatory Visit: Payer: Self-pay | Admitting: Internal Medicine

## 2013-02-05 ENCOUNTER — Ambulatory Visit: Payer: Self-pay | Admitting: Internal Medicine

## 2013-02-15 LAB — CBC CANCER CENTER
Basophil %: 0.9 %
Eosinophil %: 6.4 %
HGB: 14.2 g/dL (ref 12.0–16.0)
Lymphocyte #: 1.1 x10 3/mm (ref 1.0–3.6)
Lymphocyte %: 22.4 %
MCV: 100 fL (ref 80–100)
Monocyte #: 0.5 x10 3/mm (ref 0.2–0.9)
Monocyte %: 9.9 %
Neutrophil #: 3 x10 3/mm (ref 1.4–6.5)
Neutrophil %: 60.4 %
Platelet: 228 x10 3/mm (ref 150–440)
RDW: 13.6 % (ref 11.5–14.5)
WBC: 5.1 x10 3/mm (ref 3.6–11.0)

## 2013-02-15 LAB — HEPATIC FUNCTION PANEL A (ARMC)
Albumin: 3.5 g/dL (ref 3.4–5.0)
Bilirubin, Direct: <0.1 mg/dL (ref 0.00–0.20)
SGOT(AST): 44 U/L — ABNORMAL HIGH (ref 15–37)
SGPT (ALT): 42 U/L (ref 12–78)

## 2013-02-15 LAB — CREATININE, SERUM: EGFR (Non-African Amer.): >60

## 2013-03-08 ENCOUNTER — Ambulatory Visit: Payer: Self-pay | Admitting: Internal Medicine

## 2013-03-08 LAB — COMPREHENSIVE METABOLIC PANEL
Albumin: 3.6 g/dL (ref 3.4–5.0)
Alkaline Phosphatase: 109 U/L (ref 50–136)
Anion Gap: 7 (ref 7–16)
BUN: 10 mg/dL (ref 7–18)
Calcium, Total: 9.2 mg/dL (ref 8.5–10.1)
Chloride: 100 mmol/L (ref 98–107)
Creatinine: 0.85 mg/dL (ref 0.60–1.30)
EGFR (African American): >60
EGFR (Non-African Amer.): >60
Osmolality: 267 (ref 275–301)
Potassium: 3.3 mmol/L — ABNORMAL LOW (ref 3.5–5.1)
SGOT(AST): 27 U/L (ref 15–37)
SGPT (ALT): 24 U/L (ref 12–78)
Sodium: 134 mmol/L — ABNORMAL LOW (ref 136–145)
Total Protein: 8.2 g/dL (ref 6.4–8.2)

## 2013-03-08 LAB — URINALYSIS, COMPLETE
Bilirubin,UR: NEGATIVE
Blood: NEGATIVE
Glucose,UR: NEGATIVE mg/dL (ref 0–75)
Ph: 7 (ref 4.5–8.0)
Protein: >=500
Specific Gravity: 1.019 (ref 1.003–1.030)
Squamous Epithelial: 3
WBC UR: 6 /HPF (ref 0–5)

## 2013-03-08 LAB — DRUG SCREEN, URINE
Amphetamines, Ur Screen: NEGATIVE (ref ?–1000)
Barbiturates, Ur Screen: NEGATIVE (ref ?–200)
Benzodiazepine, Ur Scrn: NEGATIVE (ref ?–200)
Cannabinoid 50 Ng, Ur ~~LOC~~: NEGATIVE (ref ?–50)
Cocaine Metabolite,Ur ~~LOC~~: POSITIVE (ref ?–300)
MDMA (Ecstasy)Ur Screen: NEGATIVE (ref ?–500)
Methadone, Ur Screen: NEGATIVE (ref ?–300)
Phencyclidine (PCP) Ur S: NEGATIVE (ref ?–25)
Tricyclic, Ur Screen: NEGATIVE (ref ?–1000)

## 2013-03-08 LAB — ETHANOL: Ethanol: <3 mg/dL

## 2013-03-08 LAB — CBC
HCT: 49.5 % — ABNORMAL HIGH (ref 35.0–47.0)
MCHC: 35.1 g/dL (ref 32.0–36.0)
MCV: 98 fL (ref 80–100)
RBC: 5.06 10*6/uL (ref 3.80–5.20)
RDW: 14.3 % (ref 11.5–14.5)

## 2013-03-09 ENCOUNTER — Inpatient Hospital Stay: Payer: Self-pay | Admitting: Psychiatry

## 2013-03-10 LAB — BEHAVIORAL MEDICINE 1 PANEL
Albumin: 3.3 g/dL — ABNORMAL LOW (ref 3.4–5.0)
Alkaline Phosphatase: 88 U/L (ref 50–136)
Anion Gap: 6 — ABNORMAL LOW (ref 7–16)
BUN: 19 mg/dL — ABNORMAL HIGH (ref 7–18)
Basophil #: 0.1 10*3/uL (ref 0.0–0.1)
Basophil %: 0.9 %
Bilirubin,Total: 0.7 mg/dL (ref 0.2–1.0)
Calcium, Total: 9.7 mg/dL (ref 8.5–10.1)
Chloride: 102 mmol/L (ref 98–107)
Co2: 28 mmol/L (ref 21–32)
Creatinine: 0.94 mg/dL (ref 0.60–1.30)
EGFR (African American): >60
EGFR (Non-African Amer.): >60
Eosinophil #: 0.2 10*3/uL (ref 0.0–0.7)
Eosinophil %: 2.9 %
Glucose: 91 mg/dL (ref 65–99)
HCT: 46.9 % (ref 35.0–47.0)
HGB: 16.3 g/dL — ABNORMAL HIGH (ref 12.0–16.0)
Lymphocyte #: 2.1 10*3/uL (ref 1.0–3.6)
Lymphocyte %: 26.8 %
MCH: 34.3 pg — ABNORMAL HIGH (ref 26.0–34.0)
MCHC: 34.7 g/dL (ref 32.0–36.0)
MCV: 99 fL (ref 80–100)
Monocyte #: 0.6 x10 3/mm (ref 0.2–0.9)
Monocyte %: 8 %
Neutrophil #: 4.8 10*3/uL (ref 1.4–6.5)
Neutrophil %: 61.4 %
Osmolality: 274 (ref 275–301)
Platelet: 252 10*3/uL (ref 150–440)
Potassium: 3.3 mmol/L — ABNORMAL LOW (ref 3.5–5.1)
RBC: 4.74 10*6/uL (ref 3.80–5.20)
RDW: 14 % (ref 11.5–14.5)
SGOT(AST): 23 U/L (ref 15–37)
SGPT (ALT): 24 U/L (ref 12–78)
Sodium: 136 mmol/L (ref 136–145)
Thyroid Stimulating Horm: 4.09 u[IU]/mL
Total Protein: 7.2 g/dL (ref 6.4–8.2)
WBC: 7.8 10*3/uL (ref 3.6–11.0)

## 2013-04-13 ENCOUNTER — Ambulatory Visit: Payer: Self-pay | Admitting: Psychiatry

## 2013-04-13 LAB — RAPID URINE DRUG SCREEN, HOSP PERFORMED
Amphetamines, Ur Screen: NEGATIVE (ref ?–1000)
Barbiturates, Ur Screen: NEGATIVE (ref ?–200)
Benzodiazepine, Ur Scrn: NEGATIVE (ref ?–200)
Cannabinoid 50 Ng, Ur ~~LOC~~: NEGATIVE (ref ?–50)
Cocaine Metabolite,Ur ~~LOC~~: POSITIVE (ref ?–300)

## 2013-07-03 ENCOUNTER — Emergency Department: Payer: Self-pay | Admitting: Emergency Medicine

## 2013-07-04 ENCOUNTER — Emergency Department: Payer: Self-pay | Admitting: Emergency Medicine

## 2013-07-04 LAB — CBC WITH DIFFERENTIAL/PLATELET
Basophil #: 0 10*3/uL (ref 0.0–0.1)
Eosinophil %: 0.4 %
HGB: 15 g/dL (ref 12.0–16.0)
Lymphocyte #: 1.3 10*3/uL (ref 1.0–3.6)
Lymphocyte %: 10.5 %
Monocyte #: 1 x10 3/mm — ABNORMAL HIGH (ref 0.2–0.9)
Neutrophil #: 9.9 10*3/uL — ABNORMAL HIGH (ref 1.4–6.5)
Neutrophil %: 81.1 %
Platelet: 447 10*3/uL — ABNORMAL HIGH (ref 150–440)
RDW: 13.3 % (ref 11.5–14.5)

## 2013-07-04 LAB — BASIC METABOLIC PANEL
Anion Gap: 4 — ABNORMAL LOW (ref 7–16)
BUN: 15 mg/dL (ref 7–18)
Co2: 30 mmol/L (ref 21–32)
Creatinine: 0.8 mg/dL (ref 0.60–1.30)
EGFR (Non-African Amer.): >60
Glucose: 100 mg/dL — ABNORMAL HIGH (ref 65–99)
Osmolality: 267 (ref 275–301)
Potassium: 3.5 mmol/L (ref 3.5–5.1)

## 2013-07-30 ENCOUNTER — Ambulatory Visit: Payer: Self-pay | Admitting: Internal Medicine

## 2013-08-17 ENCOUNTER — Ambulatory Visit: Payer: Self-pay | Admitting: Internal Medicine

## 2013-10-06 ENCOUNTER — Ambulatory Visit: Payer: Self-pay | Admitting: Internal Medicine

## 2013-11-03 ENCOUNTER — Ambulatory Visit: Payer: Self-pay | Admitting: Internal Medicine

## 2013-11-03 LAB — HEPATIC FUNCTION PANEL A (ARMC)
ALBUMIN: 3.8 g/dL (ref 3.4–5.0)
ALK PHOS: 97 U/L
Bilirubin, Direct: 0.1 mg/dL (ref 0.00–0.20)
Bilirubin,Total: 0.3 mg/dL (ref 0.2–1.0)
SGOT(AST): 44 U/L — ABNORMAL HIGH (ref 15–37)
SGPT (ALT): 50 U/L (ref 12–78)
Total Protein: 8.3 g/dL — ABNORMAL HIGH (ref 6.4–8.2)

## 2013-11-03 LAB — CBC CANCER CENTER
BASOS PCT: 1.2 %
Basophil #: 0.1 x10 3/mm (ref 0.0–0.1)
Eosinophil #: 0.2 x10 3/mm (ref 0.0–0.7)
Eosinophil %: 2.6 %
HCT: 42.2 % (ref 35.0–47.0)
HGB: 14.4 g/dL (ref 12.0–16.0)
LYMPHS ABS: 1.6 x10 3/mm (ref 1.0–3.6)
Lymphocyte %: 24.3 %
MCH: 34.4 pg — ABNORMAL HIGH (ref 26.0–34.0)
MCHC: 34.2 g/dL (ref 32.0–36.0)
MCV: 101 fL — ABNORMAL HIGH (ref 80–100)
MONO ABS: 0.6 x10 3/mm (ref 0.2–0.9)
Monocyte %: 8.7 %
Neutrophil #: 4.2 x10 3/mm (ref 1.4–6.5)
Neutrophil %: 63.2 %
PLATELETS: 210 x10 3/mm (ref 150–440)
RBC: 4.2 10*6/uL (ref 3.80–5.20)
RDW: 13.2 % (ref 11.5–14.5)
WBC: 6.6 x10 3/mm (ref 3.6–11.0)

## 2013-11-03 LAB — CREATININE, SERUM
Creatinine: 1.06 mg/dL (ref 0.60–1.30)
EGFR (African American): >60
EGFR (Non-African Amer.): 59 — ABNORMAL LOW

## 2013-11-04 LAB — CANCER ANTIGEN 27.29: CA 27.29: 38 U/mL (ref 0.0–38.6)

## 2013-11-05 ENCOUNTER — Ambulatory Visit: Payer: Self-pay | Admitting: Internal Medicine

## 2013-11-16 DIAGNOSIS — J449 Chronic obstructive pulmonary disease, unspecified: Secondary | ICD-10-CM | POA: Insufficient documentation

## 2013-11-16 DIAGNOSIS — M48062 Spinal stenosis, lumbar region with neurogenic claudication: Secondary | ICD-10-CM

## 2013-11-16 HISTORY — DX: Spinal stenosis, lumbar region with neurogenic claudication: M48.062

## 2014-05-06 ENCOUNTER — Ambulatory Visit: Payer: Self-pay | Admitting: Internal Medicine

## 2014-05-06 LAB — CBC CANCER CENTER
Basophil #: 0 x10 3/mm (ref 0.0–0.1)
Basophil %: 0.6 %
EOS PCT: 3.5 %
Eosinophil #: 0.2 x10 3/mm (ref 0.0–0.7)
HCT: 44 % (ref 35.0–47.0)
HGB: 14.4 g/dL (ref 12.0–16.0)
Lymphocyte #: 1.2 x10 3/mm (ref 1.0–3.6)
Lymphocyte %: 18.2 %
MCH: 34.9 pg — ABNORMAL HIGH (ref 26.0–34.0)
MCHC: 32.8 g/dL (ref 32.0–36.0)
MCV: 106 fL — ABNORMAL HIGH (ref 80–100)
Monocyte #: 0.4 x10 3/mm (ref 0.2–0.9)
Monocyte %: 6 %
NEUTROS ABS: 4.7 x10 3/mm (ref 1.4–6.5)
Neutrophil %: 71.7 %
PLATELETS: 209 x10 3/mm (ref 150–440)
RBC: 4.14 10*6/uL (ref 3.80–5.20)
RDW: 14.1 % (ref 11.5–14.5)
WBC: 6.5 x10 3/mm (ref 3.6–11.0)

## 2014-05-06 LAB — CREATININE, SERUM
Creatinine: 1.05 mg/dL (ref 0.60–1.30)
EGFR (Non-African Amer.): 57 — ABNORMAL LOW

## 2014-05-06 LAB — HEPATIC FUNCTION PANEL A (ARMC)
ALBUMIN: 3.6 g/dL (ref 3.4–5.0)
ALK PHOS: 93 U/L
ALT: 49 U/L
BILIRUBIN TOTAL: 0.5 mg/dL (ref 0.2–1.0)
Bilirubin, Direct: <0.1 mg/dL (ref 0.0–0.2)
SGOT(AST): 57 U/L — ABNORMAL HIGH (ref 15–37)
TOTAL PROTEIN: 8.3 g/dL — AB (ref 6.4–8.2)

## 2014-05-08 ENCOUNTER — Ambulatory Visit: Payer: Self-pay | Admitting: Internal Medicine

## 2014-05-09 LAB — CANCER ANTIGEN 27.29: CA 27.29: 35.1 U/mL (ref 0.0–38.6)

## 2014-10-10 ENCOUNTER — Ambulatory Visit
Admit: 2014-10-10 | Disposition: A | Discharge status: Home / Self Care | Payer: Self-pay | Attending: Internal Medicine | Admitting: Internal Medicine

## 2014-10-12 ENCOUNTER — Ambulatory Visit
Admit: 2014-10-12 | Disposition: A | Discharge status: Home / Self Care | Payer: Self-pay | Attending: Internal Medicine | Admitting: Internal Medicine

## 2014-10-12 LAB — CBC CANCER CENTER
Basophil #: 0 x10 3/mm (ref 0.0–0.1)
Basophil %: 0.5 %
EOS ABS: 0.2 x10 3/mm (ref 0.0–0.7)
Eosinophil %: 2 %
HCT: 43.6 % (ref 35.0–47.0)
HGB: 14.5 g/dL (ref 12.0–16.0)
LYMPHS ABS: 1.2 x10 3/mm (ref 1.0–3.6)
LYMPHS PCT: 15.1 %
MCH: 34.7 pg — ABNORMAL HIGH (ref 26.0–34.0)
MCHC: 33.2 g/dL (ref 32.0–36.0)
MCV: 104 fL — ABNORMAL HIGH (ref 80–100)
Monocyte #: 0.6 x10 3/mm (ref 0.2–0.9)
Monocyte %: 8.4 %
NEUTROS ABS: 5.7 x10 3/mm (ref 1.4–6.5)
Neutrophil %: 74 %
Platelet: 248 x10 3/mm (ref 150–440)
RBC: 4.17 10*6/uL (ref 3.80–5.20)
RDW: 14.3 % (ref 11.5–14.5)
WBC: 7.7 x10 3/mm (ref 3.6–11.0)

## 2014-10-12 LAB — CREATININE, SERUM
CREATININE: 0.83 mg/dL
EGFR (African American): >60
EGFR (Non-African Amer.): >60

## 2014-10-12 LAB — HEPATIC FUNCTION PANEL A (ARMC)
AST: 44 U/L — AB
Albumin: 4.1 g/dL
Alkaline Phosphatase: 69 U/L
BILIRUBIN DIRECT: 0.1 mg/dL
BILIRUBIN INDIRECT: 0.5
Bilirubin,Total: 0.6 mg/dL
SGPT (ALT): 28 U/L
Total Protein: 8.5 g/dL — ABNORMAL HIGH

## 2014-10-13 LAB — CANCER ANTIGEN 27.29: CA 27.29: 46.9 U/mL — AB (ref 0.0–38.6)

## 2014-10-18 ENCOUNTER — Ambulatory Visit
Admit: 2014-10-18 | Disposition: A | Discharge status: Home / Self Care | Payer: Self-pay | Attending: Internal Medicine | Admitting: Internal Medicine

## 2014-10-21 ENCOUNTER — Other Ambulatory Visit: Payer: Self-pay | Admitting: Internal Medicine

## 2014-10-21 DIAGNOSIS — J984 Other disorders of lung: Secondary | ICD-10-CM

## 2014-10-25 NOTE — Consult Note (Signed)
Reason for Visit: This 59 year old Female patient presents to the clinic for initial evaluation of  Breast cancer .   Referred by Dr. Oliva Bustard.  Diagnosis:   Chief Complaint/Diagnosis   59 year old female status post wide local excision and sentinel node biopsy for a pathologic stage TI C. N1 (SN) M0 grade 2 invasive ductal carcinoma right breast. ER/PR positive HER-2/neu negative status post adjuvant chemotherapy   Pathology Report Surgical pathology report reviewed    Imaging Report Mammograms ultrasound reviewed    Referral Report Clinical notes reviewed    Planned Treatment Regimen Adjuvant radiation therapy to left breast and peripheral lymphatics    HPI   patient is a 59 year old female who presents with an abnormal mammogram the right breast. Needle biopsy was positive for invasive carcinoma. Ultrasound confirmed a 1.6 cm mass. She underwent a wide local excision and sentinel node biopsy. Tumor was 2 cm overall grade 2. Margins were clear. One sentinel lymph node was positive for macro metastatic disease 2 cm with extracapsular extension. Tumor was ER/PR positive HER-2/neu negative. She has gone on to complete adjuvant chemotherapyconsisting of Cytoxan Adriamycin followed by Taxol. She has done fairly well. She does have some extremity numbness in the upper and lower extremities. Otherwise she is doing well and is without complaint. She continues to be somewhat fatigued.  Past Hx:    HX: Drug Abuse:    Wheezing:    Asthma:    COPD:    Scoliosis:    Anxiety:    Depression:    Skin Cancer:    Breast Cancer:    Hepatitis C: 1998   Tumor Excision Thigh:    Tubal Ligation:    Hysterectomy:   Past, Family and Social History:   Past Medical History positive    Respiratory asthma; COPD; Wheezing    Gastrointestinal Hepatitis C    Neurological/Psychiatric anxiety; depression; Scoliosis of the spine    Past Surgical History Hysterectomy, tubal ligation, excision  of benign tumor from her thigh.    Family History positive    Family History Comments Family history positive for coronary vascular heart disease hypertension skin cancer and lung cancer    Social History positive    Social History Comments Extensive drug use history. 40 pack year smoking history polysubstance abuse prior to 2006    Additional Past Medical and Surgical History Seen by herself today   Allergies:   Penicillin: Hives  Other- Explain in Comments Line: GI Distress  Home Meds:  Home Medications: Medication Instructions Status  Cymbalta 30 mg oral delayed release capsule 1 cap(s) orally once a day (in the morning) Active  ondansetron 4 mg tablet 1 tab(s) orally every 4 hours as needed for nausa/vomiting Active  promethazine 25 mg tablet 1 tab(s) orally every 6 hours as needed for nausea or vomiting Active  Aleve Caplet 220 mg oral tablet 1 tab(s) orally 2 times a day, As Needed Active  Combivent Respimat CFC free 100 mcg-20 mcg/inh inhalation aerosol 2 puff(s) inhaled every 6 hours Active  alprazolam 0.25 mg oral tablet 1 tab(s) orally 3 times a day, As Needed- for Anxiety, Nervousness  Active  Norco 5 mg-325 mg oral tablet 1 tab(s) orally every 6 hours, As Needed- for Pain  Active  gabapentin 100 mg oral capsule 1 tablet tid x 1 week and if patient tolerates the pil then increase to 2 cap(s) orally 3 times a day Active   Review of Systems:   General negative  Performance Status (ECOG) 0    Skin negative    Breast negative    Ophthalmologic negative    ENMT negative    Respiratory and Thorax negative    Cardiovascular negative    Gastrointestinal negative    Genitourinary negative    Musculoskeletal negative    Neurological negative    Psychiatric see HPI    Hematology/Lymphatics negative    Endocrine negative    Allergic/Immunologic negative   Nursing Notes:  Nursing Vital Signs and Chemo Nursing Nursing Notes: *CC Vital Signs Flowsheet:    31-Jul-13 11:08   Temp Temperature 97.3   Pulse Pulse 98   Respirations Respirations 18   SBP SBP 153   DBP DBP 94   Pain Scale (0-10)  7   Current Weight (kg) (kg) 89.9   Height (cm) centimeters 170.1   BSA (m2) 2   Physical Exam:  General/Skin/HEENT:   General normal    Skin normal    Eyes normal    ENMT normal    Head and Neck normal    Additional PE Well-developed well-nourished female in NAD. She status post wide local excision of the left breast. No dominant mass or nodularity is noted in either breast into position examined. No axillary or supraclavicular adenopathy is identified.   Breasts/Resp/CV/GI/GU:   Respiratory and Thorax normal    Cardiovascular normal    Gastrointestinal normal    Genitourinary normal   MS/Neuro/Psych/Lymph:   Musculoskeletal normal    Neurological normal    Lymphatics normal   Assessment and Plan:  Impression:   59 year old female status post wide local excision and sentinel node biopsy for aT1 N1 pathologic stage IIa invasive mammary carcinoma of the right breast status post adjuvant chemotherapy  Plan:   I discussed the case personally Dr. Oliva Bustard. Based on the extracapsular extension and 2 cm deposit in her rightsentinel lymph node believe we need to add radiation therapy to her peripheral lymphatics as well as her right breast. Would plan on delivering 5000 cGy to both sites. Would also boost or scar another 1400 cGy. Risks and benefits of treatment were reviewed with the patient. Risks of treatment including skin reaction, and lymphedema of left upper extremity were both explained in detail to the patient. I have set her up for CT simulation early next week.  I would like to take this opportunity to thank you for allowing me to continue to participate in this patient's care.  CC Referral:   cc: Juluis Pitch   Electronic Signatures: Armstead Peaks (MD)  (Signed 08-Aug-13 08:15)  Authored: HPI, Diagnosis, Past Hx,  PFSH, Allergies, Home Meds, ROS, Nursing Notes, Physical Exam, Encounter Assessment and Plan, CC Referring Physician   Last Updated: 08-Aug-13 08:15 by Armstead Peaks (MD)

## 2014-10-28 NOTE — H&P (Signed)
PATIENT NAME:  Regina Bernard, Regina Bernard MR#:  [id number] DATE OF BIRTH:  03-Sep-1955  DATE OF ADMISSION:  03/09/2013  IDENTIFYING INFORMATION AND CHIEF COMPLAINT:  A 58 year old woman who came to the Emergency Room requesting detox from heroin.   CHIEF COMPLAINT:  "I need some options."   HISTORY OF PRESENT ILLNESS:  The patient stated that she has been using heroin several bags a day for the last couple months.  She uses it IV.  She has also been using cocaine both nasally and IV.  She last had any heroin about three days prior to admission to the hospital.  She states that she has a past history of substance dependence, but had maintained several years of sobriety until a recent relapse that started with pain pills.  It escalated to where she was using regular intravenous heroin and cocaine.  She is feeling very ashamed and depressed.  She does not report suicidal ideation.  Does not report psychotic symptoms.   PAST PSYCHIATRIC HISTORY:  The patient has had treatment at [facility] previously for opiate withdrawal and also has had treatment at [facility] and other inpatient facilities.  She denies a history of suicidal behavior or homicidal behavior.  She has not been on other psychiatric medicines.  No other clear psychiatric diagnosis.   PAST MEDICAL HISTORY:  The patient has breast cancer which was treated with a resection and she continues to follow up with an oncologist.  She also has COPD for which she uses regular inhalers.   SOCIAL HISTORY:  The patient lives with her husband.  She has an adult son who does not live in the area.  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  Her husband works very long hours.  The patient describes her relationship with her husband as being good.  She also reports that she has supportive friends.   FAMILY HISTORY:  No known family history.   CURRENT MEDICATIONS:  Anastrozole 1 mg daily, calcium 500  mg daily, Cymbalta 30 mg twice daily, gabapentin 300 mg twice daily, trazodone as needed, Combivent inhaler 1 puff 4 times a day as needed.   ALLERGIES:  PENICILLIN.   REVIEW OF SYSTEMS:  Complains of feeling jittery and shaky.  Sick to her stomach.  Not having diarrhea.  Denies suicidal or homicidal ideation.  Denies hallucinations.  She has been able to eat some today.  Not having severe pain.   MENTAL STATUS EXAMINATION:  The patient is a disheveled woman who looks her stated age who is cooperative with the interview.  Eye contact is good.  Psychomotor activity is very fidgety and jittery.  Speech is loud at times and initially pressured, but she is able to calm it down well.  It does appear to be under control.  Affect is irritable, but also controllable.  Mood is stated as being bad.  Thoughts appear to be generally lucid without loosening of associations or delusions.  Denies auditory or visual hallucinations.  Denies suicidal or homicidal ideation.  Judgment and insight currently adequate.  Mental state as far as intelligence normal.  Alert and oriented x 4.   PHYSICAL EXAMINATION: GENERAL:  The patient appears to be feeling fidgety and has a bit of a tremor all over.  HEENT:  Pupils equal and reactive.  Face symmetric.   NECK AND BACK:  Nontender.  SKIN:  No acute skin lesions identified.  Has scars from old  surgery, especially from her breast surgery.  Full range of motion at all extremities.  Normal gait.  Strength and reflexes normal and symmetric throughout.  LUNGS:  Diffusely wheezy throughout.  HEART:  Regular rate and rhythm.  ABDOMEN:  Normal bowel sounds.  Nontender.   LABORATORY RESULTS:  Admission labs show alcohol nondetected.  TSH normal at 1.8.  Lipase normal 198.  Chemistry panel, low sodium 134, low potassium 3.3.  Drug screen positive for cocaine and opiates.   ASSESSMENT:  A 59 year old woman with heroin dependence and cocaine dependence.  Fidgety and physically  uncomfortable, but not reporting current suicidal ideation.  Has a history of breast cancer as well.  The patient required inpatient detox treatment and supportive substance abuse treatment.   TREATMENT PLAN:  She will be treated with Suboxone for three days.  Other supportive medications for opiate detox in place.  Continue her current regular medications for her usual medical problems.  Engage her in group and individual therapy on the unit.  She can discuss treatment planning once she is detoxed with social work.  May go to in or outpatient treatment depending on how she is feeling.   DIAGNOSIS, PRINCIPAL AND PRIMARY:  AXIS I:  Opiate dependence.   SECONDARY DIAGNOSES:  AXIS I:   1.  Cocaine dependence. 2.  Substance-induced mood disorder, anxious and irritable. AXIS II:  Deferred.  AXIS III:  Breast cancer, chronic obstructive pulmonary disease.  AXIS IV:  Severe from her substance abuse and how it has affected her relationship with her family.  AXIS V:  Functioning at time of evaluation 72.     ____________________________ Gonzella Lex, MD jtc:ea D: 03/10/2013 23:55:20 ET T: 03/11/2013 00:20:05 ET JOB#: [id number]  cc: Gonzella Lex, MD, <Dictator> Gonzella Lex MD ELECTRONICALLY SIGNED 03/11/2013 12:39

## 2014-10-28 NOTE — Discharge Summary (Signed)
PATIENT NAME:  Regina Bernard, Regina Bernard MR#:  [id number] DATE OF BIRTH:  1956/02/18  DATE OF ADMISSION:  10/19/2012 DATE OF DISCHARGE:  10/23/2012  ADMISSION DIAGNOSIS:  Altered mental status.   DISCHARGE DIAGNOSES: 1.  Altered mental status likely secondary to alcohol withdrawal.  2.  Alcohol withdrawal.  3.  Transient ischemic attack.  4.  Breast cancer.  5.  Chronic hepatitis-C.  6.  Hypokalemia.  7.  Depression.  8.  Elevated blood pressure.  9.  Smoking dependence.   CONSULTS:  Psychiatry.   PERTINENT LABORATORY AT DISCHARGE:  Sodium 134, potassium 3.6, chloride 102, bicarb 24, BUN 15, creatinine 0.79, glucose is 81, magnesium 1.9.   HOSPITAL COURSE:  This is a 59 year old female who presented with altered mental status secondary to alcohol withdrawal. For further details, please refer to the H and P.   1.  Altered mental status likely secondary to alcohol withdrawal, metabolic encephalopathy and dehydration with polysubstance abuse including opiates and cocaine. The patient was also taking chronic pain medications. Her confusion had actually improved. She was placed on CIWA protocol. She had an uneventful detox. She is now awake , alert, and oriented and not confused.  2.  Escherichia coli urinary tract infection. The patient was changed from ceftriaxone to ciprofloxacin and will continue her treatment for this. The patient was treated for five days of antibiotics and does not need antibiotics at discharge.  3.  Chronic hepatitis-C. The patient will follow up as an outpatient.  4.  Hypokalemia, repleted.  5.  Breast cancer. Outpatient followup was recommended.  6.  A history of depression. A Psych evaluation was placed. Dr. Franchot Mimes is seeing the patient. She did have a Actuary. She had no suicidal ideations and the sitter discontinued.  7.  Elevated blood pressure. Initially was started on Norvasc; however, I think her blood pressure was just elevated due to her withdrawal. She will need  outpatient followup for her blood pressure as well.   DISCHARGE MEDICATIONS:  1.  Combivent 2 puffs q. 6 hours. 2.  Anastrozole 1 mg daily for breast cancer.  3.  Gabapentin 100 mg 3 tablets t.i.d.  4.  Fluoxetine 30 mg daily.  5.  Biotin 5000 mg daily.  6.  Calcium 1 tablet b.i.d.  7.  Multivitamin 1 tablet daily.   HOME HEALTH:  With Physical Therapy for gait training and balance.   DISCHARGE DIET:  Regular.   DISCHARGE ACTIVITY:  As tolerated.   DISCHARGE FOLLOWUP:  The patient is to followup with Dr. Lovie Macadamia in one week.   TIME SPENT:  Approximately 35 minutes.  ____________________________ Donell Beers. Benjie Karvonen, MD spm:jm D: 10/23/2012 15:14:59 ET T: 10/24/2012 11:07:03 ET JOB#: [id number]  cc: Flemon Kelty P. Benjie Karvonen, MD, <Dictator> Youlanda Roys. Lovie Macadamia, MD Donell Beers Danta Baumgardner MD ELECTRONICALLY SIGNED 10/25/2012 12:42

## 2014-10-28 NOTE — Consult Note (Signed)
PATIENT NAME:  Regina Bernard, Regina Bernard MR#:  [id number] DATE OF BIRTH:  17-May-1956  DATE OF CONSULTATION:  03/09/2013  REFERRING PHYSICIAN:  ED physician CONSULTING PHYSICIAN:  Cordelia Pen. Gretel Acre, MD  REASON FOR CONSULTATION: "I'm so sick, I need something for the nausea."   HISTORY OF PRESENT ILLNESS: The patient is a 59 year old Caucasian female who presented to the ED, as she reported that she has been having nausea and vomiting as she has been withdrawing from the heroin. She reported that she had been consuming heroin for the past 2 months. She usually uses 4 to 5 bags per day. She stated that she has been using heroin as IV, although she initially started using pills. She reported that her last use was on Thursday and now she is currently experiencing withdrawal symptoms including nausea, vomiting, shaking, and has not been able to eat anything. The patient reported that she has also been using cocaine for the past 2 years and has lost 45 pounds in 20 months. She was using cocaine by snorting. She drinks at least 2 drinks on a daily basis. She has been using alcohol for her whole life. The patient reported that she needs something to help her stop using, as she feels like a nervous wreck and has been feeling worse. The patient reported that she wants to get better. The patient currently denied having any suicidal ideations or plans.   PAST PSYCHIATRIC HISTORY: The patient reported that she has been hospitalized in the past because of using drugs. She reported that she wants to get better and has been to the drug rehab programs in the past and has a history of sobriety for 4 years, 5 years, as well as for a long time in the past. Reported her longest sobriety was in 2005 when she was incarcerated and then sent to the recovery house. She was incarcerated due to stealing drugs and then went to the recovery house. She also mentioned that she went to [facility] in 1998 to [place] one time. The patient reported that her  husband was also using drugs with her for the past 2 years.   ALCOHOL AND DRUG HISTORY: The patient reported that she started drinking [place] wine, 2 or more drinks per day for many years. She has a history of 1 DUI in 1 year ago. She never lost her driver's license. Reported that she has never used marijuana. She has also used cocaine by IV in the past. She is currently using IV heroin.   MEDICAL HISTORY: The patient has been diagnosed with breast cancer and is currently following with Dr. Ma Hillock as an oncologist. She reported that she is taking medications for the same and she is supposed to take them on a regular basis, but she stopped using the medications for the past couple of days. Her current home medications only included oxycodone 5 mg 1 to 2 tablets as needed for pain.   ALLERGIES: PENICILLIN.   PAST MEDICAL HISTORY: Asthma, COPD, scoliosis, breast cancer, hepatitis C. She reported that she was in a trial at [facility] for almost 1 year. History of tubal ligation, history of hysterectomy.   SOCIAL HISTORY: The patient currently lives with her husband for the past 2 years, although they were together for the past 24 years.   VITAL SIGNS: Temperature 98, pulse 71, respirations 18, blood pressure 154/89.  LABORATORY DATA: Glucose 89, BUN 10, creatinine 0.85, sodium 134, potassium 3.3, chloride 100, bicarbonate 27, anion gap 7, lipase 198. Protein  8.2, albumin 3.6, bilirubin 0.6, AST 27, ALT 24. Urine drug screen positive for cocaine, opioids. WBC 12.8, hemoglobin 17.4, hematocrit 49.5, MCH 34.3.  REVIEW OF SYSTEMS:    CONSTITUTIONAL: Denies any fever or chills. Has lost weight.  EYES: No double or blurred vision.  RESPIRATORY: No shortness of breath or cough.  CARDIOVASCULAR: Denies any chest pain or orthopnea.  GASTROINTESTINAL: Complaining of abdominal pain, nausea and vomiting.  GENITOURINARY: No incontinence or frequency.  ENDOCRINE: No heat or cold intolerance.  LYMPHATIC:  No anemia or easy bruising.  INTEGUMENTARY: No acne or rash.  MUSCULOSKELETAL: Complaining of muscle and joint pain.  NEUROLOGIC: No tingling or weakness.   MENTAL STATUS EXAMINATION: The patient is a moderately built female who appeared restless during the interview. She was up and down during the interview and was feeling anxious. She maintained fair eye contact. Her speech was low in tone and volume. Mood was depressed and anxious. Affect was congruent. Thought process was circumstantial. Thought content was nondelusional. She denied having any suicidal ideations or plans. She wants some medications to help with her withdrawal symptoms. She denies having any perceptual disturbances.   DIAGNOSTIC IMPRESSION: AXIS I: 1.  Polysubstance dependence.  2.  Opioid withdrawal.  AXIS II: None.  AXIS III: Please review the medical history.   TREATMENT PLAN: 1.  The patient will be admitted to the inpatient behavioral health unit for safety and stabilization.  2.  She will be started on the opioid withdrawal protocol including clonidine 0.1 mg p.o. q.6 hours p.r.n. for withdrawal. She also be given trazodone, Ativan and Flexeril to help with her withdrawal symptoms. She will be monitored by the treatment team on a regular basis.   Thank you for allowing me to participate in the care of this patient.   ____________________________ Cordelia Pen. Gretel Acre, MD usf:jm D: 03/09/2013 16:35:49 ET T: 03/09/2013 17:41:40 ET JOB#: [id number]  cc: Cordelia Pen. Gretel Acre, MD, <Dictator> Jeronimo Norma MD ELECTRONICALLY SIGNED 03/11/2013 13:57

## 2014-10-28 NOTE — H&P (Signed)
PATIENT NAME:  Regina Bernard, Regina Bernard MR#:  [id number] DATE OF BIRTH:  01-Dec-1955  DATE OF ADMISSION:  10/19/2012  PRIMARY CARE PROVIDER:  Dr. Juluis Pitch  ED REFERRING PHYSICIAN:   Dr. Jimmye Norman  CHIEF COMPLAINT: Confusion, agitation.   HISTORY OF PRESENT ILLNESS: The patient is a 59 year old Caucasian female with grade 2 invasive ductal carcinoma of the right breast, status post partial mastectomy and sentinel node study, who has completed chemo and also has completed radiation. She also has a history of polysubstance abuse in the past and also drinks about a pint of vodka a day.  She also, according to the husband, has chronic scoliosis and chronic back pain and was initially treated with pain medications, but subsequently these have been weaned off of  her; however, she manages to get the pain medications from an external source, he is not sure who. He reports that he works about 14 hours a day, so he is not with her and is not able to control what she does. The patient recently went to the beach house and started developing nausea and vomiting a few days ago, and then stopped eating and drinking much. She also stopped drinking 3 days ago; then, 2 days ago she started becoming confused and agitated. Her husband brought her back because all her doctors are here; and currently she is confused, does not know where she is, and did not recognize him, so we are asked to admit the patient for possible withdrawal. She is unable to give me any history. Her  husband only reported that she had some nausea and vomiting, has chronic back pain. She has not had any fevers or chills. Otherwise, review of systems is not obtainable.   PAST MEDICAL HISTORY: 1. History of chronic hepatitis C.  2. History of polysubstance abuse, including cocaine, marijuana.  She apparently stopped using those drugs in 2006.  3. History of endometriosis.  4. History of facial cancer, status post removal in 2003.  5. Breast cancer, status  post chemoradiation.  6. Status post partial hysterectomy.  7. Chronic back pain.  8. Bilateral tubal ligation.  9. Upper right leg benign tumor removal.   ALLERGIES: PENICILLIN.   CURRENT MEDICATIONS: Anastrozole 1 mg daily, Biotin 5000 mg daily, Calcium plus vitamin D 1 tab p.o. b.i.d., Combivent 2 puffs q. 6 p.r.n., Duloxetine 30 daily, gabapentin 100 mg 3 caps t.i.d., multivitamin p.o. daily.   SOCIAL HISTORY: The patient is a chronic smoker, and smoked for 40 years and continues to smoke. According to her husband, the patient drinks every day. She also abuses pain medications.   FAMILY HISTORY: Remarkable for heart disease, stroke, hypertension, skin cancer, lung cancer.   REVIEW OF SYSTEMS: Unobtainable.   PHYSICAL EXAMINATION: VITAL SIGNS: Temperature 97.9, pulse 78, respirations 20, blood pressure 133/95.  GENERAL: The patient is very disheveled, confused, in no acute distress.  HEENT: Head atraumatic, normocephalic. Pupils are equally round, reactive to light and accommodation. There is no conjunctival pallor. No scleral icterus. Nasal exam shows no drainage or ulceration. Oropharynx is clear without any exudate.  NECK: No thyromegaly. No carotid bruits.  CARDIOVASCULAR: Regular rate and rhythm. No murmurs, rubs, clicks or gallops. PMI is not displaced.  ABDOMEN: Soft, nontender, nondistended. Positive bowel sounds x 4.  EXTREMITIES: No clubbing, cyanosis or edema.  SKIN: No rash.  LYMPHATICS: No lymph nodes palpable.  VASCULAR: Good DP, PT pulses.  PSYCHIATRIC: Anxious, not oriented to place, person or time.  NEUROLOGIC: Spontaneously moving all 4  extremities. Limited cranial nerve exams, but they grossly look normal   LABORATORY AND RADIOLOGICAL DATA:  On evaluation in the ED, CT scan of the head shows no evidence of acute intracranial processes. Chest x-ray shows no cardiopulmonary processes.   Urinalysis shows blood 2+, bacteria 3+. WBC 14.4, hemoglobin 18.2, platelet  count 361. Glucose 89, BUN 24, creatinine 0.92, sodium 134, potassium 3.6, chloride 96, calcium 10.7. LFTs are normal except total protein of 9.9. Troponin less than 0.02.   ASSESSMENT AND PLAN: The patient is a 59 year old white female with a history of breast cancer and underwent chemo and radiation treatment with history of cocaine abuse in the past, now has alcohol abuse and possible pain medication misuse, comes in with confusion and agitation.   1. Confusion and agitation:  Likely due to alcohol withdrawal, also on chronic pain medication. Has not drank in the last few days, also has not taken any pain meds in the last few days. This could be alcohol as well as opioid withdrawal. At this time, we will place her on CIWA protocol, monitor her respiratory status.  2. Dehydration: We will give her aggressive IV fluids.  3. Elevated calcium: Possibly due to volume depletion. I will check ionized calcium in the morning.  4. Leukocytosis, possibly reactive: No fevers here. We will monitor her temperature, hold off on any antibiotics for the time being.  5. History of breast cancer:  When able to take p.o., the patient will be restarted on her Anastrozole.   6. Chronic hepatitis C.  7. Depression: The patient will need psychiatry evaluation once more awake.    TIME SPENT: 45 minutes spent.   ____________________________ Lafonda Mosses. Posey Pronto, MD shp:cb D: 10/19/2012 18:41:47 ET T: 10/19/2012 22:07:40 ET JOB#: [id number]  cc: Kaytelynn Scripter H. Posey Pronto, MD, <Dictator> Alric Seton MD ELECTRONICALLY SIGNED 10/20/2012 20:39

## 2014-10-28 NOTE — Consult Note (Signed)
PATIENT NAME:  Regina Bernard, Regina Bernard MR#:  [id number] DATE OF BIRTH:  12-12-1955  DATE OF CONSULTATION:  10/23/2012  REFERRING PHYSICIAN:    CONSULTING PHYSICIAN:  Sofie Schendel K. Franchot Mimes, MD  PLACE OF DICTATION:  [facility], room #48, [place], [place], [place].  AGE:  59 years.  SEX:  Female.  RACE:  White.  The patient was in consultation in room #L48.   SUBJECTIVE:  The patient is a 59 year old white female, not employed, graduated from [employer] in 2014.  The patient is married for 2 years and been together for 26 years.  The patient lives with husband in a house.  The patient was asked to be seen in consultation for polysubstance abuse and came in confused and alcohol withdrawal and evaluation.   CHIEF COMPLAINT:  "I am not sure.  I was going through a lot.  My father died, my mother died, my brother was leaving away to [place] and I am feeling lonely and I am depressed."  HISTORY OF PRESENT ILLNESS:  The patient admits drinking alcohol at a rate of 2 drinks a day of white russian which is a liquor and she has been drinking off and on at this rate for a long time.    PAST PSYCHIATRIC HISTORY:  History of inpatient psychiatry once before 10 years ago for drinking and detox.  No history of suicidal attempts, not being followed by psychiatrist.    ALCOHOL AND DRUGS:  Drinks white russian, about 2 drinks or more per day for many years.  Had 1 DWI years ago.  Never lost her driver's license.  Never arrested for public drunkenness.  Denies smoking THC.  Does admit using cocaine by IV method and last used 15 years ago.  Does admit smoking nicotine cigarettes at a rate of 12 per day for many years.    MENTAL STATUS EXAMINATION:  The patient was seen lying in bed with a sitter on observation, alert and oriented to place, person and time with a little prompting and help.  She knew it was [facility] and 2014.  She knew [place] and called current president  called "Lilla Shook."  Denies feeling depressed.  Denies feeling hopeless, helpless.  Denies feeling worthless or useless.  She could count money and said 4 quarters, 10 dimes, 20 nickels, 100 pennies, a dollar.  Memory and recall is rather poor and she could remember 1 of the 3 objects that was asked and remembered after several minutes and got frustrated about that.  Regarding judgement profile she said she would put envelope already stamped and addressed inthe  mailbox.  Insight and judgement guarded.  The patient has limited or no insight into her alcohol drinking and she feels that she can quit if she wants to or when she wants to and there is no need for any help at this time.  Absolutely denies any ideas of plans to hurt herself or others and is eager to go home.    IMPRESSION:  Alcohol abuse-dependence, chronic, continuous.  RECOMMENDATIONS:  Discontinue one on one sitter as patient contracts for safety.  Discharge patient home with husband when medically cleared and stable.  The patient is not interested in getting any help from any kind of substance abuse programs at this time and in the future if she is interested she is aware to call AA or call the local community mental health for help.     ____________________________ Wallace Cullens.  Franchot Mimes, MD skc:ea D: 10/23/2012 15:28:39 ET T: 10/24/2012 00:11:17 ET JOB#: [id number]  cc: Arlyn Leak K. Franchot Mimes, MD, <Dictator> Dewain Penning MD ELECTRONICALLY SIGNED 10/24/2012 20:12

## 2014-10-28 NOTE — Discharge Summary (Signed)
PATIENT NAME:  Regina Bernard, TURBIN MR#:  [id number] DATE OF BIRTH:  1955/11/30  DATE OF ADMISSION:  03/09/2013 DATE OF DISCHARGE:  03/15/2013  HOSPITAL COURSE: See dictated history and physical for details of admission. This 59 year old woman with a history of opiate dependence was admitted to the hospital to complete opiate detox and stabilize her mood after an episode of relapse into abuse of multiple drugs including heroin. In the hospital, the patient was initially irritable and withdrawn and had typical opiate withdrawal symptoms. She was treated with a three day taper of Suboxone. P.r.n. medication was also available. The patient engaged appropriately with the treatment team in discussing options for further substance abuse treatment. Her mood calmed down. She was able to return to sleeping adequately. She was continued on outpatient medicine, including her cancer prevention medicine, Cymbalta 30 mg twice a day, gabapentin 300 mg twice a day, trazodone 150 mg at night and a Combivent inhaler. The patient eventually settled on going to [place] for follow-up substance abuse treatment. She has a bed available for admission there tomorrow. She insisted that she wanted to be discharged the night before so that she could go home and spend time with her husband. The patient was counseled that this was not recommended and that she was running a high risk of relapse. She listened to my arguments and opinion, but insisted that she did not think that that would happened to her. She was, however, made clearly aware of what I thought were the risks and my recommendation that she stay in the hospital overnight. However at this point, she does not appear to be committable. She will be release today and her husband is evidently, agreeable to having her come home and then have follow-up at [facility] tomorrow.   LABORATORY RESULTS: Alcohol not detected. TSH normal, lipase normal, chemistry panel showed a low  sodium at 134 and a low potassium at 3.3. Drug screen positive for cocaine and opiates.   DISCHARGE MEDICATIONS: Gabapentin 300 mg twice a day, trazodone 150 mg at night, duloxetine 30 mg 2 times a day, anastrozole 1 mg per day, Combivent inhaler 4 times a day as needed for shortness of breath, docusate 100 mg twice a day, calcium and vitamin D combination 500/200 one tablet twice a day, multivitamin once a day 5000 mg of biotin supplement a day.   MENTAL STATUS EXAM AT DISCHARGE: Neatly dressed and groomed woman, looks her stated age, cooperative with the interview. Eye contact good. Psychomotor activity normal. Speech normal in rate, tone and volume. Affect mildly euphoric, but controlled. Mood stated as being good. Thoughts are generally lucid without loosening of associations. No evidence of delusional thinking. Denies auditory or visual hallucinations. Denies any suicidal or homicidal ideation. Sites multiple things that she is positive about looking forward to. Alert and oriented x 4. Normal intelligence. Judgment and insight adequate.   DIAGNOSIS, PRINCIPAL AND PRIMARY:  AXIS I: Opiate dependence.   SECONDARY DIAGNOSES: AXIS I:  Cocaine dependence.  Depression, not otherwise specified.  AXIS II: Deferred.  AXIS III: Breast cancer in remission, chronic obstructive pulmonary disease.  AXIS IV: Severe from the strain that her substance abuse and was put on herself and her family.  AXIS V: Functioning at time of discharge 51.  ____________________________ Gonzella Lex, MD jtc:dp D: 03/15/2013 22:40:07 ET T: 03/16/2013 07:40:03 ET JOB#: [id number]  cc: Gonzella Lex, MD, <Dictator> Gonzella Lex MD ELECTRONICALLY SIGNED 03/16/2013 9:52

## 2014-10-28 NOTE — Consult Note (Signed)
Consult: treatment recommendations Patient was seen as requested to explore option of [facility] CD-IOP at this discharge and was seen as a prime candidate for treatment at this level of care despite her having experienced multiple treatment services in the past to include methadone maintenance. She indicated family history include brother who overdosed and the negative consequences of his death to include her own personal feeling of guilt because she was not available to "keep" him from overdosing. She detailed her medical history to include her being positive for cancer and COPD. She also self-disclosed that her husband is supportive of her goals of recovery. She plans to seek Residential treatment locally and at the end of that treatment her plans include participation in the [facility] CD-IOP. Encouraged to particate in the CD-IOP at this discharge. CD-IOP informational sheet given to patient. Assigned Nurse informed of discharge recommendation.      Electronic Signatures: Laqueta Due (PsyD) (Signed on 05-Sep-14 22:17)  Authored   Last Updated: 05-Sep-14 22:23 by Laqueta Due (PsyD)

## 2014-10-30 NOTE — Op Note (Signed)
PATIENT NAME:  Regina Bernard, Regina Bernard MR#:  [id number] DATE OF BIRTH:  1956-06-30  DATE OF PROCEDURE:  07/15/2011  PREOPERATIVE DIAGNOSIS: Carcinoma of the right breast.   POSTOPERATIVE DIAGNOSIS: Carcinoma of the right breast.   PROCEDURES PERFORMED:  1. Insertion of central venous catheter with subcutaneous infusion port.  2. Right axillary lymph node dissection.   SURGEON: Loreli Dollar, MD  ANESTHESIA: General.   INDICATIONS: This 59 year old female has a history of right partial mastectomy. Did have axillary lymph node biopsy demonstrating cancer within the axillary sentinel lymph node. Her oncologist recommended axillary dissection and also recommended insertion of central venous catheter for venous access for chemotherapy.   DESCRIPTION OF PROCEDURE: The patient was placed on the operating table in the supine position under general anesthesia. A rolled sheet was placed behind her shoulder blades. Her head was placed on a doughnut ring. The neck was extended and the head turned 20 degrees to the right. Also the right arm was placed on a lateral arm rest exposing the right axilla. Next the right axilla, right breast and surrounding arm and chest wall and anterior aspect of her neck and left subclavian areas were all prepared with ChloraPrep and draped in a sterile manner.   First on the patient's left side the skin beneath the clavicle was infiltrated with 1% Xylocaine. A transversely oriented 3 cm incision was made, carried down through subcutaneous tissues down to the deep fascia. A subcutaneous pouch was created large enough to admit the PFM port. Next, the ultrasound was used to demonstrate the left jugular vein and a short 6 mm incision was made overlying the jugular vein and using ultrasound guidance a needle was inserted into the jugular vein and a guidewire was threaded in and ultrasound image was saved for the paper chart. Next, fluoroscopy was used to demonstrate the location of the  wire in the vena cava. The dilator and introducer sheath were advanced over the guidewire. The guidewire and dilator were removed. The catheter was advanced down through the sheath and the sheath was peeled away. Next the tip of the catheter was positioned in the superior vena cava. This was demonstrated with fluoroscopy and an image saved for the PACS system. Next, the catheter was tunneled down to the subclavian incision. It was cut to fit and attached to the PFM port using the accompanying sleeve. The port was accessed with [facility] needle, aspirated a trace of blood and then flushed with 10 mL of saline. The port was placed into the subcutaneous pouch and sutured to the deep fascia with 4-0 silk. It is noted that during the course of the procedure numerous small bleeding points were cauterized. Hemostasis was now intact. The subcutaneous tissues were closed with additional 5-0 Vicryl. Both skin incisions were closed with 5-0 Vicryl subcuticular suture and Dermabond.   Patient was in satisfactory condition and attention was turned to the right axilla. There was an old scar and incision was made at the site of the old scar and was continued slightly further posteriorly. Dissection was carried down encountering some scar tissue but the plane of dissection could be demonstrated and was carried down through superficial fascia down to the pectoralis major muscle. Next, blunt and sharp dissection was carried out. There was some scar tissue deep within the axilla and dissection was carried out to expose the right axillary vein and further dissection was carried out to remove a portion of adipose tissue containing lymph nodes. The long thoracic nerve was  demonstrated and also thoracodorsal vessels and these were left intact. Harmonic scalpel was used for much of the axillary dissection. Several clamped vessels were suture ligated with 4-0 chromic and after removing the axillary contents the wound was inspected and  hemostasis appeared to be intact. Next a 101 [place] Blake drain was inserted and brought out through a separate inferior stab wound, sutured to the skin with 3-0 nylon and also was cut to fit. Next, the superficial fascia was closed with 4-0 Monocryl and the skin was closed with running 4-0 Monocryl subcuticular suture and Dermabond. Dressings were applied with benzoin and paper tape around the drain site and the drain was activated. There was scant serosanguineous drainage and the patient was subsequently prepared for transfer to the recovery room.   ____________________________ Lenna Sciara. Rochel Brome, MD jws:cms D: 07/15/2011 13:58:44 ET T: 07/15/2011 14:15:37 ET JOB#: [id number]  cc: Loreli Dollar, MD, <Dictator> Loreli Dollar MD ELECTRONICALLY SIGNED 08/11/2011 15:03

## 2014-11-17 ENCOUNTER — Telehealth: Payer: Self-pay | Admitting: *Deleted

## 2014-11-17 NOTE — Telephone Encounter (Signed)
When pt was here on last visit she was told to call and see how she is doing on the new AI-exemestane. She states she has horrible hot flashes drenching in sweat just like when she was on the other AI.  She stopped taking the pill and she feels back to almost normal even though she sweats a lot naturally.  She is miserable, she feels like hormones are off balance, she has no quality of life, no self worth.  She did read about a study in [place] for people with hot flashes and wants [place] to look into it to see if she would qualify or if it would be a study she could go in.  I told her that when she was in the office Dr. Ma Hillock had spoke with her about her hot flashes and was going to try new med and if it did not work maybe try tamoxifen.  I told her I would check with Dr. Ma Hillock and call her back tom. She is agreeable to the plan.

## 2014-11-17 NOTE — Telephone Encounter (Signed)
States she was supposed to get a call from Dr Candelaria Stagers nurse regarding yesterdays visit. Also asking about the hot flash study and if she qualifies. Asking Judeen Hammans call her back

## 2014-11-21 NOTE — Telephone Encounter (Signed)
On Friday 5:45 called pt and got her voicemail and I left message that dr pandit said to start on tamoxifen and I called it in for her to the pharmacy.  Then I have tried looking up the hot flash study that she mentioned and each website I went to did not have any studies in [place]. I left the message that if she could help me exactly because the website name she mentioned did not get me to an exact website.  Also that she could call her PCP and see if there were hormone levels they could check and most of the time if excessive sweating even off the AI is there then they could check thyroid to see that level is.  Asked her to call me back if she has questions.

## 2015-01-16 ENCOUNTER — Other Ambulatory Visit: Payer: Self-pay

## 2015-01-16 DIAGNOSIS — C50919 Malignant neoplasm of unspecified site of unspecified female breast: Secondary | ICD-10-CM

## 2015-01-17 ENCOUNTER — Ambulatory Visit
Admission: RE | Admit: 2015-01-17 | Discharge: 2015-01-17 | Disposition: A | Discharge status: Home / Self Care | Payer: BLUE CROSS/BLUE SHIELD | Source: Ambulatory Visit | Attending: Internal Medicine | Admitting: Internal Medicine

## 2015-01-17 ENCOUNTER — Inpatient Hospital Stay: Payer: BLUE CROSS/BLUE SHIELD | Attending: Internal Medicine

## 2015-01-17 DIAGNOSIS — I700 Atherosclerosis of aorta: Secondary | ICD-10-CM | POA: Diagnosis not present

## 2015-01-17 DIAGNOSIS — C50919 Malignant neoplasm of unspecified site of unspecified female breast: Secondary | ICD-10-CM

## 2015-01-17 DIAGNOSIS — C50912 Malignant neoplasm of unspecified site of left female breast: Secondary | ICD-10-CM | POA: Insufficient documentation

## 2015-01-17 DIAGNOSIS — Z170 Estrogen receptor positive status [ER+]: Secondary | ICD-10-CM | POA: Diagnosis not present

## 2015-01-17 DIAGNOSIS — K760 Fatty (change of) liver, not elsewhere classified: Secondary | ICD-10-CM | POA: Insufficient documentation

## 2015-01-17 DIAGNOSIS — J439 Emphysema, unspecified: Secondary | ICD-10-CM | POA: Insufficient documentation

## 2015-01-17 DIAGNOSIS — J984 Other disorders of lung: Secondary | ICD-10-CM

## 2015-01-17 HISTORY — DX: Essential (primary) hypertension: I10

## 2015-01-17 HISTORY — DX: Unspecified asthma, uncomplicated: J45.909

## 2015-01-17 LAB — CREATININE, SERUM
CREATININE: 0.85 mg/dL (ref 0.44–1.00)
GFR calc Af Amer: >60 mL/min (ref >60)
GFR calc non Af Amer: >60 mL/min (ref >60)

## 2015-01-17 MED ORDER — IOHEXOL 300 MG/ML  SOLN
75.0000 mL | Freq: Once | INTRAMUSCULAR | Status: AC | PRN
  Administered 2015-01-17: 75 mL via INTRAVENOUS

## 2015-01-18 LAB — CANCER ANTIGEN 27.29: CA 27.29: 36.8 U/mL (ref 0.0–38.6)

## 2015-01-31 ENCOUNTER — Telehealth: Payer: Self-pay | Admitting: *Deleted

## 2015-01-31 NOTE — Telephone Encounter (Signed)
Requesting recent CT scan results.Marland KitchenMarland Kitchen

## 2015-02-01 NOTE — Telephone Encounter (Signed)
Called pt yest. And went over her results and also she asked me to send copy to dr Raul Del office because she is seeing him soon.  There is still something in lung but said it was interstitial thickening and nodularity and felt to be similar from prior scan and rec: f/u scan in 3-4 months.

## 2015-04-14 ENCOUNTER — Inpatient Hospital Stay: Payer: BLUE CROSS/BLUE SHIELD

## 2015-04-14 ENCOUNTER — Inpatient Hospital Stay: Payer: BLUE CROSS/BLUE SHIELD | Admitting: Internal Medicine

## 2015-04-30 ENCOUNTER — Other Ambulatory Visit: Payer: Self-pay | Admitting: *Deleted

## 2015-04-30 DIAGNOSIS — C50911 Malignant neoplasm of unspecified site of right female breast: Secondary | ICD-10-CM

## 2015-05-01 ENCOUNTER — Inpatient Hospital Stay: Payer: BLUE CROSS/BLUE SHIELD

## 2015-05-01 ENCOUNTER — Inpatient Hospital Stay: Payer: BLUE CROSS/BLUE SHIELD | Admitting: Internal Medicine

## 2015-05-17 ENCOUNTER — Inpatient Hospital Stay: Payer: BLUE CROSS/BLUE SHIELD

## 2015-05-17 ENCOUNTER — Inpatient Hospital Stay: Payer: BLUE CROSS/BLUE SHIELD | Admitting: Internal Medicine

## 2015-06-07 ENCOUNTER — Encounter: Payer: Self-pay | Admitting: Internal Medicine

## 2015-06-07 ENCOUNTER — Inpatient Hospital Stay: Payer: BLUE CROSS/BLUE SHIELD | Attending: Internal Medicine | Admitting: Internal Medicine

## 2015-06-07 ENCOUNTER — Inpatient Hospital Stay: Payer: BLUE CROSS/BLUE SHIELD

## 2015-06-07 VITALS — BP 190/130 | HR 103 | Temp 97.3°F | Resp 18 | Ht 67.0 in | Wt 208.3 lb

## 2015-06-07 DIAGNOSIS — Z170 Estrogen receptor positive status [ER+]: Secondary | ICD-10-CM

## 2015-06-07 DIAGNOSIS — C50911 Malignant neoplasm of unspecified site of right female breast: Secondary | ICD-10-CM | POA: Insufficient documentation

## 2015-06-07 DIAGNOSIS — J45909 Unspecified asthma, uncomplicated: Secondary | ICD-10-CM | POA: Diagnosis not present

## 2015-06-07 DIAGNOSIS — Z79899 Other long term (current) drug therapy: Secondary | ICD-10-CM | POA: Diagnosis not present

## 2015-06-07 DIAGNOSIS — Z23 Encounter for immunization: Secondary | ICD-10-CM | POA: Diagnosis not present

## 2015-06-07 DIAGNOSIS — N951 Menopausal and female climacteric states: Secondary | ICD-10-CM | POA: Diagnosis not present

## 2015-06-07 DIAGNOSIS — F17210 Nicotine dependence, cigarettes, uncomplicated: Secondary | ICD-10-CM | POA: Diagnosis not present

## 2015-06-07 DIAGNOSIS — Z9119 Patient's noncompliance with other medical treatment and regimen: Secondary | ICD-10-CM

## 2015-06-07 DIAGNOSIS — D7589 Other specified diseases of blood and blood-forming organs: Secondary | ICD-10-CM | POA: Insufficient documentation

## 2015-06-07 DIAGNOSIS — I10 Essential (primary) hypertension: Secondary | ICD-10-CM | POA: Insufficient documentation

## 2015-06-07 LAB — HEPATIC FUNCTION PANEL
ALBUMIN: 4 g/dL (ref 3.5–5.0)
ALT: 46 U/L (ref 14–54)
AST: 71 U/L — AB (ref 15–41)
Alkaline Phosphatase: 103 U/L (ref 38–126)
BILIRUBIN TOTAL: 0.7 mg/dL (ref 0.3–1.2)
Bilirubin, Direct: 0.2 mg/dL (ref 0.1–0.5)
Indirect Bilirubin: 0.5 mg/dL (ref 0.3–0.9)
Total Protein: 8.8 g/dL — ABNORMAL HIGH (ref 6.5–8.1)

## 2015-06-07 LAB — CBC WITH DIFFERENTIAL/PLATELET
BASOS PCT: 2 %
Basophils Absolute: 0.1 10*3/uL (ref 0–0.1)
Eosinophils Absolute: 0.1 10*3/uL (ref 0–0.7)
Eosinophils Relative: 1 %
HEMATOCRIT: 45 % (ref 35.0–47.0)
HEMOGLOBIN: 15.1 g/dL (ref 12.0–16.0)
LYMPHS ABS: 1.3 10*3/uL (ref 1.0–3.6)
LYMPHS PCT: 14 %
MCH: 35.7 pg — AB (ref 26.0–34.0)
MCHC: 33.5 g/dL (ref 32.0–36.0)
MCV: 106.6 fL — AB (ref 80.0–100.0)
MONO ABS: 0.6 10*3/uL (ref 0.2–0.9)
MONOS PCT: 7 %
NEUTROS ABS: 7.3 10*3/uL — AB (ref 1.4–6.5)
NEUTROS PCT: 78 %
Platelets: 278 10*3/uL (ref 150–440)
RBC: 4.22 MIL/uL (ref 3.80–5.20)
RDW: 14.2 % (ref 11.5–14.5)
WBC: 9.4 10*3/uL (ref 3.6–11.0)

## 2015-06-07 LAB — CREATININE, SERUM
Creatinine, Ser: 0.87 mg/dL (ref 0.44–1.00)
GFR calc Af Amer: >60 mL/min (ref >60)

## 2015-06-07 MED ORDER — INFLUENZA VAC SPLIT QUAD 0.5 ML IM SUSY
0.5000 mL | PREFILLED_SYRINGE | Freq: Once | INTRAMUSCULAR | Status: AC
  Administered 2015-06-07: 0.5 mL via INTRAMUSCULAR
  Filled 2015-06-07: qty 0.5

## 2015-06-07 NOTE — Progress Notes (Signed)
[facility] OFFICE PROGRESS NOTE  Patient Care Team: Juluis Pitch, MD as PCP - General (Family Medicine)   SUMMARY OF ONCOLOGIC HISTORY:  # RIGHT BREAST STAGE II [pT1psN1; neg 15LN s/p ALND] s/p Lumpec [Dr.Smith] s/p RT; AC-taxol; AI [Aromasin; April 2016]; April 2016- Mammo NEG  # Hot flashes- sec to AI.   INTERVAL HISTORY:  This is my first interaction with the patient since I joined the practice September 2016. I reviewed the patient's prior charts/pertinent labs/imaging in detail; findings are summarized above.   59 year old female patient with above history of stage II breast cancer currently on AI is here for follow-up. Patient denies any new lumps or bumps. Denies any significant weight loss. She is under a lot of stress because of social issues.   Patient is having significant hot flashes and sweating- has stopped taking her AI; she is not even clear which aromatase inhibitor if she is taking.    REVIEW OF SYSTEMS:  A complete 10 point review of system is done which is negative except mentioned above/history of present illness.   PAST MEDICAL HISTORY :  Past Medical History  Diagnosis Date  . Asthma   . Cancer (HCC)     Breast CA- Right   . Hypertension     PAST SURGICAL HISTORY :   Past Surgical History  Procedure Laterality Date  . Breast lumpectomy Right 2013    FAMILY HISTORY :   Family History  Problem Relation Age of Onset  . Diabetes Maternal Grandmother     SOCIAL HISTORY:   Social History  Substance Use Topics  . Smoking status: Current Every Day Smoker -- 0.25 packs/day for 44 years    Types: Cigarettes  . Smokeless tobacco: None  . Alcohol Use: None    ALLERGIES:  is allergic to penicillins.  MEDICATIONS:  Current Outpatient Prescriptions  Medication Sig Dispense Refill  . albuterol (PROVENTIL) (2.5 MG/3ML) 0.083% nebulizer solution     . Biotin 1 MG CAPS Take by mouth.    . Calcium Carbonate-Vitamin D 600-400 MG-UNIT  tablet Take by mouth.    . COMBIVENT RESPIMAT 20-100 MCG/ACT AERS respimat     . DULoxetine (CYMBALTA) 30 MG capsule Take by mouth.    [name] exemestane (AROMASIN) 25 MG tablet Take by mouth.    . Fluticasone Furoate-Vilanterol (BREO ELLIPTA) 100-25 MCG/INH AEPB Inhale into the lungs.    . gabapentin (NEURONTIN) 300 MG capsule Take by mouth.    . lamoTRIgine (LAMICTAL) 100 MG tablet Take 100 mg by mouth daily.    [name] levothyroxine (SYNTHROID, LEVOTHROID) 75 MCG tablet Take by mouth.    . Multiple Vitamin (MULTI-VITAMINS) TABS Take by mouth.    . SUBOXONE 8-2 MG FILM      No current facility-administered medications for this visit.    PHYSICAL EXAMINATION: ECOG PERFORMANCE STATUS: 0 - Asymptomatic  BP 190/130 mmHg  Pulse 103  Temp(Src) 97.3 F (36.3 C) (Tympanic)  Resp 18  Ht 5\' 7"  (1.702 m)  Wt 208 lb 5.4 oz (94.5 kg)  BMI 32.62 kg/m2  SpO2 93%  Filed Weights   06/07/15 1456  Weight: 208 lb 5.4 oz (94.5 kg)    GENERAL: Well-nourished well-developed; Alert, no distress and comfortable.   Alone.  EYES: no pallor or icterus OROPHARYNX: no thrush or ulceration; good dentition  NECK: supple, no masses felt LYMPH:  no palpable lymphadenopathy in the cervical, axillary or inguinal regions LUNGS: clear to auscultation and  No wheeze or crackles  HEART/CVS: regular rate & rhythm and no murmurs; No lower extremity edema ABDOMEN:abdomen soft, non-tender and normal bowel sounds Musculoskeletal:no cyanosis of digits and no clubbing  PSYCH: alert & oriented x 3 with fluent speech NEURO: no focal motor/sensory deficits SKIN:  no rashes or significant lesions Right and left BREAST exam [in the presence of nurse]-RIGHT- no unusual skin changes or dominant masses felt. Surgical scars noted.    LABORATORY DATA:  I have reviewed the data as listed    Component Value Date/Time   NA 133* 07/04/2013 1216   K 3.5 07/04/2013 1216   CL 99 07/04/2013 1216   CO2 30 07/04/2013 1216   GLUCOSE 100*  07/04/2013 1216   BUN 15 07/04/2013 1216   CREATININE 0.87 06/07/2015 1430   CREATININE 0.83 10/12/2014 1239   CALCIUM 8.8 07/04/2013 1216   PROT 8.8* 06/07/2015 1430   PROT 8.5* 10/12/2014 1239   ALBUMIN 4.0 06/07/2015 1430   ALBUMIN 4.1 10/12/2014 1239   AST 71* 06/07/2015 1430   AST 44* 10/12/2014 1239   ALT 46 06/07/2015 1430   ALT 28 10/12/2014 1239   ALKPHOS 103 06/07/2015 1430   ALKPHOS 69 10/12/2014 1239   BILITOT 0.7 06/07/2015 1430   BILITOT 0.6 10/12/2014 1239   GFRNONAA >60 06/07/2015 1430   GFRNONAA >60 10/12/2014 1239   GFRNONAA 57* 05/06/2014 1207   GFRAA >60 06/07/2015 1430   GFRAA >60 10/12/2014 1239   GFRAA >60 05/06/2014 1207    No results found for: SPEP, UPEP  Lab Results  Component Value Date   WBC 9.4 06/07/2015   NEUTROABS 7.3* 06/07/2015   HGB 15.1 06/07/2015   HCT 45.0 06/07/2015   MCV 106.6* 06/07/2015   PLT 278 06/07/2015      Chemistry      Component Value Date/Time   NA 133* 07/04/2013 1216   K 3.5 07/04/2013 1216   CL 99 07/04/2013 1216   CO2 30 07/04/2013 1216   BUN 15 07/04/2013 1216   CREATININE 0.87 06/07/2015 1430   CREATININE 0.83 10/12/2014 1239      Component Value Date/Time   CALCIUM 8.8 07/04/2013 1216   ALKPHOS 103 06/07/2015 1430   ALKPHOS 69 10/12/2014 1239   AST 71* 06/07/2015 1430   AST 44* 10/12/2014 1239   ALT 46 06/07/2015 1430   ALT 28 10/12/2014 1239   BILITOT 0.7 06/07/2015 1430   BILITOT 0.6 10/12/2014 1239       RADIOGRAPHIC STUDIES: I have personally reviewed the radiological images as listed and agreed with the findings in the report. No results found.   ASSESSMENT & PLAN:   # RIGHT BREAST CA STAGE II ER/PR- Pos; her 2 NEG; clinically no evidence of recurrence noted. Continue aromatase inhibitor. Patient had been noncompliant with her AI- secondary to hot flashes. Recommend that she takes Cymbalta twice a day; in approximately week later started going back on aromatase inhibitor. Most recently  she was prescribed Aromasin.  # hot flashes- severe-secondary to AI therapy. Plan as above.  # reviewed the blood counts; within normal limits except for macrocytosis with a normal hemoglobin. Follow-up labs; tumor Marker in 6 months. Mammogram in 6 months.   No orders of the defined types were placed in this encounter.   25 minutes face-to-face with more than 50% of time spent on counseling and coordination.    Cammie Sickle, MD 06/07/2015 3:27 PM

## 2015-06-07 NOTE — Progress Notes (Signed)
Patient is here for follow-up of breast cancer. Patient states that she has neuropathy in her fingers and it is difficult for her use her hands to do things such as type. Patient has been out of her gabapentin for a few days. Patient sweats a lot and thought it was the AI so she stopped it. She is still having bad sweating even after stopping the AI so she is going to start back. She has been having a lot of SOB due to COPD.

## 2015-06-08 LAB — CANCER ANTIGEN 27.29: CA 27.29: 41.7 U/mL — ABNORMAL HIGH (ref 0.0–38.6)

## 2015-06-14 ENCOUNTER — Other Ambulatory Visit: Payer: Self-pay | Admitting: Specialist

## 2015-06-14 DIAGNOSIS — R918 Other nonspecific abnormal finding of lung field: Secondary | ICD-10-CM

## 2015-06-14 DIAGNOSIS — R0609 Other forms of dyspnea: Secondary | ICD-10-CM

## 2015-06-29 ENCOUNTER — Ambulatory Visit
Admission: RE | Admit: 2015-06-29 | Discharge: 2015-06-29 | Disposition: A | Discharge status: Home / Self Care | Payer: BLUE CROSS/BLUE SHIELD | Source: Ambulatory Visit | Attending: Specialist | Admitting: Specialist

## 2015-06-29 DIAGNOSIS — R0609 Other forms of dyspnea: Secondary | ICD-10-CM

## 2015-06-29 DIAGNOSIS — R05 Cough: Secondary | ICD-10-CM | POA: Diagnosis present

## 2015-06-29 DIAGNOSIS — K760 Fatty (change of) liver, not elsewhere classified: Secondary | ICD-10-CM | POA: Insufficient documentation

## 2015-06-29 DIAGNOSIS — R918 Other nonspecific abnormal finding of lung field: Secondary | ICD-10-CM

## 2015-06-29 DIAGNOSIS — J449 Chronic obstructive pulmonary disease, unspecified: Secondary | ICD-10-CM | POA: Diagnosis not present

## 2015-07-05 ENCOUNTER — Ambulatory Visit: Payer: BLUE CROSS/BLUE SHIELD

## 2015-10-11 ENCOUNTER — Ambulatory Visit: Payer: BLUE CROSS/BLUE SHIELD

## 2015-10-11 ENCOUNTER — Other Ambulatory Visit: Payer: BLUE CROSS/BLUE SHIELD

## 2015-10-11 ENCOUNTER — Other Ambulatory Visit: Payer: Self-pay | Admitting: Specialist

## 2015-10-11 DIAGNOSIS — R918 Other nonspecific abnormal finding of lung field: Secondary | ICD-10-CM

## 2015-10-25 ENCOUNTER — Ambulatory Visit
Admission: RE | Admit: 2015-10-25 | Discharge: 2015-10-25 | Disposition: A | Discharge status: Home / Self Care | Payer: BLUE CROSS/BLUE SHIELD | Source: Ambulatory Visit | Attending: Internal Medicine | Admitting: Internal Medicine

## 2015-10-25 ENCOUNTER — Other Ambulatory Visit: Payer: Self-pay | Admitting: Internal Medicine

## 2015-10-25 DIAGNOSIS — C50911 Malignant neoplasm of unspecified site of right female breast: Secondary | ICD-10-CM

## 2015-10-25 DIAGNOSIS — Z853 Personal history of malignant neoplasm of breast: Secondary | ICD-10-CM | POA: Diagnosis not present

## 2015-10-25 HISTORY — DX: Malignant neoplasm of unspecified site of unspecified female breast: C50.919

## 2015-11-06 ENCOUNTER — Ambulatory Visit
Admission: RE | Admit: 2015-11-06 | Discharge: 2015-11-06 | Disposition: A | Discharge status: Home / Self Care | Payer: BLUE CROSS/BLUE SHIELD | Source: Ambulatory Visit | Attending: Specialist | Admitting: Specialist

## 2015-11-06 DIAGNOSIS — R0609 Other forms of dyspnea: Secondary | ICD-10-CM | POA: Diagnosis present

## 2015-11-06 DIAGNOSIS — R05 Cough: Secondary | ICD-10-CM | POA: Insufficient documentation

## 2015-11-06 DIAGNOSIS — K760 Fatty (change of) liver, not elsewhere classified: Secondary | ICD-10-CM | POA: Diagnosis not present

## 2015-11-06 DIAGNOSIS — R918 Other nonspecific abnormal finding of lung field: Secondary | ICD-10-CM | POA: Diagnosis not present

## 2015-11-21 ENCOUNTER — Telehealth: Payer: Self-pay

## 2015-11-21 ENCOUNTER — Ambulatory Visit (INDEPENDENT_AMBULATORY_CARE_PROVIDER_SITE_OTHER): Payer: BLUE CROSS/BLUE SHIELD | Admitting: Cardiothoracic Surgery

## 2015-11-21 ENCOUNTER — Encounter: Payer: Self-pay | Admitting: Cardiothoracic Surgery

## 2015-11-21 VITALS — BP 137/87 | HR 90 | Temp 98.0°F | Ht 67.0 in | Wt 204.6 lb

## 2015-11-21 DIAGNOSIS — R918 Other nonspecific abnormal finding of lung field: Secondary | ICD-10-CM

## 2015-11-21 NOTE — Telephone Encounter (Signed)
I called patient and informed her of her appointments listed below. She verbalized understanding. She was instructed to not eat or drink anything after midnight the night before for the Pet scan, however PFT's do not require any prep.

## 2015-11-21 NOTE — Telephone Encounter (Signed)
  Please see appointment dates and times below at [facility], [place].   11/28/15 Pet Scan- 8:00 am NPO after midnight the night before 11/28/15 PFT's - 10:00 am - NO prep  12/01/15 Dr. Genevive Bi- 9:00 am

## 2015-11-21 NOTE — Progress Notes (Signed)
Patient ID: Regina Bernard, female   DOB: 08/02/1955, 60 y.o.   MRN: [id number]  Chief Complaint  Patient presents with  . New Patient (Initial Visit)    Pulmonary Nodule    Referred By Dr. Raul Del Reason for Referral left upper lobe mass  HPI Location, Quality, Duration, Severity, Timing, Context, Modifying Factors, Associated Signs and Symptoms.  Regina Bernard is a 60 y.o. female.  In July of last year she sought attention with Dr. Raul Del for increasing shortness of breath. Her history is that in 2013 she underwent a right-sided lumpectomy followed by radiation therapy and chemotherapy. She states that since that time she's been quite short of breath. She recently was seen by Dr. Raul Del where a repeat CT scan was performed. This was compared to prior CTs back in December and in July. There has been a dominant left upper lobe mass which has increased in size over the last year. There is no associated mediastinal lymphadenopathy. The patient states that she has been very short of breath and is unable to do much of any work around the house. She cannot walk more than 100 feet. She's been placed on oxygen because minimal exertion drops her oxygen saturations into the low 90s and upper 80s. She states she had a complete set of pulmonary function studies done about a year ago. Those results are not available to me. She comes in today for evaluation of this new left upper lobe mass.   Past Medical History  Diagnosis Date  . Asthma   . Cancer (HCC)     Breast CA- Right   . Hypertension   . Breast cancer ([place]) 06/18/2011    right breast cancer    Past Surgical History  Procedure Laterality Date  . Breast lumpectomy Right 2013    Family History  Problem Relation Age of Onset  . Diabetes Maternal Grandmother     Social History Social History  Substance Use Topics  . Smoking status: Current Every Day Smoker -- 0.25 packs/day for 44 years    Types: Cigarettes  . Smokeless tobacco:  Never Used  . Alcohol Use: No    Allergies  Allergen Reactions  . Penicillins Rash    Pt states at age 64, she broke out in rash and had swelling.     Current Outpatient Prescriptions  Medication Sig Dispense Refill  . albuterol (PROVENTIL) (2.5 MG/3ML) 0.083% nebulizer solution     . Biotin 1 MG CAPS Take by mouth.    . Calcium Carbonate-Vitamin D 600-400 MG-UNIT tablet Take by mouth.    . COMBIVENT RESPIMAT 20-100 MCG/ACT AERS respimat     . DULoxetine (CYMBALTA) 30 MG capsule Take by mouth.    [name] exemestane (AROMASIN) 25 MG tablet Take by mouth.    . Fluticasone Furoate-Vilanterol (BREO ELLIPTA) 100-25 MCG/INH AEPB Inhale into the lungs.    . gabapentin (NEURONTIN) 300 MG capsule Take by mouth.    . lamoTRIgine (LAMICTAL) 100 MG tablet Take 100 mg by mouth daily.    [name] levothyroxine (SYNTHROID, LEVOTHROID) 75 MCG tablet Take by mouth.    . Multiple Vitamin (MULTI-VITAMINS) TABS Take by mouth.    . SUBOXONE 8-2 MG FILM      No current facility-administered medications for this visit.      Review of Systems A complete review of systems was asked and was negative except for the following positive findingsSwelling of legs, shortness of breath, wheezing, heartburn, joint pain, headaches, depression, easy bruising.  Blood  pressure 137/87, pulse 90, temperature 98 F (36.7 C), temperature source Oral, height 5\' 7"  (1.702 m), weight 204 lb 9.6 oz (92.806 kg).  Physical Exam CONSTITUTIONAL:  Pleasant, well-developed, well-nourished, and in no acute distress. EYES: Pupils equal and reactive to light, Sclera non-icteric EARS, NOSE, MOUTH AND THROAT:  The oropharynx was clear.  Dentition is good repair.  Oral mucosa pink and moist. LYMPH NODES:  Lymph nodes in the neck and axillae were normal RESPIRATORY:  Lungs were clear but very distant..  Normal respiratory effort without pathologic use of accessory muscles of respiration CARDIOVASCULAR: Heart was regular without murmurs.  There  were no carotid bruits. GI: The abdomen was soft, nontender, and nondistended. There were no palpable masses. There was no hepatosplenomegaly. There were normal bowel sounds in all quadrants. GU:  Rectal deferred.   MUSCULOSKELETAL:  Normal muscle strength and tone.  No clubbing or cyanosis.   SKIN:  There were no pathologic skin lesions.  There were no nodules on palpation. NEUROLOGIC:  Sensation is normal.  Cranial nerves are grossly intact. PSYCH:  Oriented to person, place and time.  Mood and affect are normal.  Data Reviewed I have independently reviewed the patient's CT scans. There is an increasing left upper lobe mass.  I have personally reviewed the patient's imaging, laboratory findings and medical records.    Assessment    Left upper lobe mass most consistent with a bronchogenic carcinoma. I would like to get a complete set of pulmonary function studies as well as a PET scan. I discussed with her the possibility of performing a CT-guided needle biopsy.    Plan    We will obtain a PET scan and PFTs.       Nestor Lewandowsky, MD 11/21/2015, 10:05 AM

## 2015-11-21 NOTE — Patient Instructions (Signed)
You will need to have a CT scan and pulmonary function test done and we will call you with the appointment times.

## 2015-11-21 NOTE — Telephone Encounter (Signed)
LVM  For patient to return call so we can let her know the below appointments at [facility].    11/28/15- Pet Scan 8:00 am NPO after midnight the night before 11/28/15 PFT's @ 10:00am - No prep

## 2015-11-28 ENCOUNTER — Encounter
Admission: RE | Admit: 2015-11-28 | Discharge: 2015-11-28 | Disposition: A | Discharge status: Home / Self Care | Payer: BLUE CROSS/BLUE SHIELD | Source: Ambulatory Visit | Attending: Cardiothoracic Surgery | Admitting: Cardiothoracic Surgery

## 2015-11-28 ENCOUNTER — Other Ambulatory Visit: Payer: Self-pay

## 2015-11-28 ENCOUNTER — Ambulatory Visit: Payer: BLUE CROSS/BLUE SHIELD

## 2015-11-28 DIAGNOSIS — R918 Other nonspecific abnormal finding of lung field: Secondary | ICD-10-CM

## 2015-11-28 LAB — GLUCOSE, CAPILLARY: Glucose-Capillary: 115 mg/dL — ABNORMAL HIGH (ref 65–99)

## 2015-11-28 LAB — BLOOD GAS, ARTERIAL
ACID-BASE EXCESS: 6.7 mmol/L — AB (ref 0.0–3.0)
ALLENS TEST (PASS/FAIL): POSITIVE — AB
BICARBONATE: 33 meq/L — AB (ref 21.0–28.0)
FIO2: 21
O2 Saturation: 91.2 %
PCO2 ART: 52 mmHg — AB (ref 32.0–48.0)
PH ART: 7.41 (ref 7.350–7.450)
PO2 ART: 61 mmHg — AB (ref 83.0–108.0)
Patient temperature: 37

## 2015-11-28 MED ORDER — ALBUTEROL SULFATE (2.5 MG/3ML) 0.083% IN NEBU
2.5000 mg | INHALATION_SOLUTION | Freq: Once | RESPIRATORY_TRACT | Status: AC
  Administered 2015-11-28: 2.5 mg via RESPIRATORY_TRACT
  Filled 2015-11-28: qty 3

## 2015-11-28 MED ORDER — FLUDEOXYGLUCOSE F - 18 (FDG) INJECTION
12.8300 | Freq: Once | INTRAVENOUS | Status: AC | PRN
  Administered 2015-11-28: 12.83 via INTRAVENOUS

## 2015-11-29 ENCOUNTER — Other Ambulatory Visit: Payer: Self-pay

## 2015-12-01 ENCOUNTER — Ambulatory Visit (INDEPENDENT_AMBULATORY_CARE_PROVIDER_SITE_OTHER): Payer: BLUE CROSS/BLUE SHIELD | Admitting: Cardiothoracic Surgery

## 2015-12-01 ENCOUNTER — Encounter: Payer: Self-pay | Admitting: Cardiothoracic Surgery

## 2015-12-01 VITALS — BP 148/93 | HR 88 | Temp 98.0°F | Wt 205.0 lb

## 2015-12-01 DIAGNOSIS — R918 Other nonspecific abnormal finding of lung field: Secondary | ICD-10-CM | POA: Diagnosis not present

## 2015-12-01 NOTE — Progress Notes (Signed)
[facility] Post-Op Note  Patient ID: Regina Bernard, female   DOB: [birth year], 60 y.o.   MRN: [id number]  HISTORY: This patient returns today in follow-up. She saw Dr. Raul Del in pulmonary medicine yesterday. Unfortunately he did not have the results of her pulmonary function studies at that time. He did counsel her on the use of her oxygen and her inhalers. Unfortunately she does not utilize the inhalers like she should be. She states it's been 3 days now since she's smoked. She remains quite short of breath and states that she does not feel well. She has numbness in the tips of her fingers and her toes. She thinks this is because of the chemotherapy which she had for her breast cancer. Her pulmonary function studies were performed earlier this week and reveal an FEV1 that is between 0.8 and 1.1 or 33-43% of predicted. Her DLCO is 57%. Her arterial blood gas revealed a PCO2 of 52 and a PO2 of 63. These studies are consistent with severe obstructive lung disease.   Filed Vitals:   12/01/15 0922  BP: 148/93  Pulse: 88  Temp: 98 F (36.7 C)     EXAM: Resp: Lungs are Very distant bilaterally with minimal air entry in the upper lobes and rhonchi and wheezes scattered throughout..  No respiratory distress, normal effort but with some suprasternal retraction Heart:  Regular without murmurs Abd:  Abdomen is soft, non distended and non tender. No masses are palpable.  There is no rebound and no guarding.  Neurological: Alert and oriented to person, place, and time. Coordination normal.  Skin: Skin is warm and dry. No rash noted. No diaphoretic. No erythema. No pallor.  Psychiatric: Normal mood and affect. Normal behavior. Judgment and thought content normal.    ASSESSMENT: I have independently reviewed the PET scan the pulmonary function studies and the arterial blood gas. The PET scan showed minimal uptake in the left upper lobe area most consistent with a early adenocarcinoma if this is a  malignancy. Alternatively this may represent chest scar from her disease. She is not a candidate for surgery at this point in time.   PLAN:   I would like to refer the patient to Dr. Fabienne Bruns in oncology for his opinion regarding the management of her left upper lobe mass as well as her breast cancer. She is scheduled to follow-up with him next week. In addition I would like to make a referral to for pulmonary rehabilitation and see if they can improve her underlying lung disease. I did not make a return visit for her at this point in time but I would be happy to see her should the need arise.    Nestor Lewandowsky, MD

## 2015-12-01 NOTE — Patient Instructions (Addendum)
Please go and see Dr. Rogue Bussing to follow up on your breast cancer and lung.  We will also call you for your Pulmonary rehab appointment.  Please continue using your albuterol.

## 2015-12-05 ENCOUNTER — Other Ambulatory Visit: Payer: Self-pay | Admitting: *Deleted

## 2015-12-05 DIAGNOSIS — C50911 Malignant neoplasm of unspecified site of right female breast: Secondary | ICD-10-CM

## 2015-12-06 ENCOUNTER — Inpatient Hospital Stay: Payer: BLUE CROSS/BLUE SHIELD | Attending: Internal Medicine

## 2015-12-06 ENCOUNTER — Encounter: Payer: Self-pay | Admitting: Internal Medicine

## 2015-12-06 ENCOUNTER — Inpatient Hospital Stay (HOSPITAL_BASED_OUTPATIENT_CLINIC_OR_DEPARTMENT_OTHER): Payer: BLUE CROSS/BLUE SHIELD | Admitting: Internal Medicine

## 2015-12-06 VITALS — BP 127/72 | HR 116 | Temp 97.9°F | Resp 23 | Wt 202.2 lb

## 2015-12-06 DIAGNOSIS — Z79899 Other long term (current) drug therapy: Secondary | ICD-10-CM | POA: Insufficient documentation

## 2015-12-06 DIAGNOSIS — I10 Essential (primary) hypertension: Secondary | ICD-10-CM | POA: Diagnosis not present

## 2015-12-06 DIAGNOSIS — G629 Polyneuropathy, unspecified: Secondary | ICD-10-CM | POA: Insufficient documentation

## 2015-12-06 DIAGNOSIS — Z87891 Personal history of nicotine dependence: Secondary | ICD-10-CM | POA: Insufficient documentation

## 2015-12-06 DIAGNOSIS — J45909 Unspecified asthma, uncomplicated: Secondary | ICD-10-CM | POA: Diagnosis not present

## 2015-12-06 DIAGNOSIS — C50911 Malignant neoplasm of unspecified site of right female breast: Secondary | ICD-10-CM

## 2015-12-06 DIAGNOSIS — Z170 Estrogen receptor positive status [ER+]: Secondary | ICD-10-CM

## 2015-12-06 DIAGNOSIS — D7589 Other specified diseases of blood and blood-forming organs: Secondary | ICD-10-CM

## 2015-12-06 DIAGNOSIS — R911 Solitary pulmonary nodule: Secondary | ICD-10-CM

## 2015-12-06 DIAGNOSIS — G6289 Other specified polyneuropathies: Secondary | ICD-10-CM

## 2015-12-06 DIAGNOSIS — J449 Chronic obstructive pulmonary disease, unspecified: Secondary | ICD-10-CM | POA: Diagnosis not present

## 2015-12-06 LAB — COMPREHENSIVE METABOLIC PANEL
ALBUMIN: 4.1 g/dL (ref 3.5–5.0)
ALK PHOS: 104 U/L (ref 38–126)
ALT: 37 U/L (ref 14–54)
ANION GAP: 8 (ref 5–15)
AST: 66 U/L — ABNORMAL HIGH (ref 15–41)
BILIRUBIN TOTAL: 0.8 mg/dL (ref 0.3–1.2)
BUN: 10 mg/dL (ref 6–20)
CALCIUM: 10 mg/dL (ref 8.9–10.3)
CO2: 32 mmol/L (ref 22–32)
Chloride: 95 mmol/L — ABNORMAL LOW (ref 101–111)
Creatinine, Ser: 0.86 mg/dL (ref 0.44–1.00)
GLUCOSE: 146 mg/dL — AB (ref 65–99)
POTASSIUM: 4.3 mmol/L (ref 3.5–5.1)
Sodium: 135 mmol/L (ref 135–145)
TOTAL PROTEIN: 9.4 g/dL — AB (ref 6.5–8.1)

## 2015-12-06 LAB — CBC WITH DIFFERENTIAL/PLATELET
Basophils Absolute: 0.1 10*3/uL (ref 0–0.1)
Basophils Relative: 1 %
Eosinophils Absolute: 0.2 10*3/uL (ref 0–0.7)
Eosinophils Relative: 2 %
HEMATOCRIT: 46.7 % (ref 35.0–47.0)
HEMOGLOBIN: 16 g/dL (ref 12.0–16.0)
LYMPHS ABS: 1.6 10*3/uL (ref 1.0–3.6)
Lymphocytes Relative: 15 %
MCH: 36.5 pg — AB (ref 26.0–34.0)
MCHC: 34.4 g/dL (ref 32.0–36.0)
MCV: 106.2 fL — AB (ref 80.0–100.0)
MONO ABS: 0.9 10*3/uL (ref 0.2–0.9)
MONOS PCT: 8 %
NEUTROS ABS: 8.4 10*3/uL — AB (ref 1.4–6.5)
NEUTROS PCT: 76 %
Platelets: 265 10*3/uL (ref 150–440)
RBC: 4.4 MIL/uL (ref 3.80–5.20)
RDW: 15.7 % — AB (ref 11.5–14.5)
WBC: 11.1 10*3/uL — ABNORMAL HIGH (ref 3.6–11.0)

## 2015-12-06 MED ORDER — GABAPENTIN 300 MG PO CAPS: 300.0000 mg | ORAL_CAPSULE | Freq: Two times a day (BID) | ORAL | Status: DC

## 2015-12-06 NOTE — Progress Notes (Signed)
[place] OFFICE PROGRESS NOTE  Patient Care Team: Juluis Pitch, MD as PCP - General (Family Medicine)   SUMMARY OF ONCOLOGIC HISTORY:  # RIGHT BREAST STAGE II [pT1psN1; neg 15LN s/p ALND] s/p Lumpec [Dr.Smith] s/p RT; AC-taxol; AI [Aromasin; April 2016];STOPPED AI on Jan 2017.  April 2016- Mammo NEG  # LUL lung nodule-   # PN- G-2 on Neurontin  # Hot flashes- sec to AI. COPD- on 2 Lit [place] [Dr.Fleming]  INTERVAL HISTORY:  60 year old female patient with above history of stage II breast cancer currently on AI is here for follow-up.   In the interim patient was diagnosed with a left upper lobe lung nodule for which she ended up having a PET scan/ also met with Dr. Faith Rogue.  She stopped taking her aromatase inhibitors approximately 3-4 months ago- because of headaches/hot flashes/significant weight loss.  Patient also been diagnosed with COPD in the last month also; and she is on 2 L of oxygen. She has chronic cough not any worse. Chronic shortness of breath. Very anxious.   REVIEW OF SYSTEMS:  A complete 10 point review of system is done which is negative except mentioned above/history of present illness.   PAST MEDICAL HISTORY :  Past Medical History  Diagnosis Date  . Asthma   . Cancer (HCC)     Breast CA- Right   . Hypertension   . Breast cancer ([place]) 06/18/2011    right breast cancer    PAST SURGICAL HISTORY :   Past Surgical History  Procedure Laterality Date  . Breast lumpectomy Right 2013    FAMILY HISTORY :   Family History  Problem Relation Age of Onset  . Diabetes Maternal Grandmother     SOCIAL HISTORY:   Social History  Substance Use Topics  . Smoking status: Former Smoker -- 0.25 packs/day for 44 years    Types: Cigarettes    Quit date: 11/28/2015  . Smokeless tobacco: Never Used  . Alcohol Use: No    ALLERGIES:  is allergic to penicillins.  MEDICATIONS:  Current Outpatient Prescriptions  Medication Sig Dispense Refill  .  albuterol (PROVENTIL) (2.5 MG/3ML) 0.083% nebulizer solution     . Biotin 1 MG CAPS Take by mouth.    . Buprenorphine HCl-Naloxone HCl (SUBOXONE) 8-2 MG FILM Take 1 tablet by mouth 3 (three) times daily.    . Calcium Carbonate-Vitamin D 600-400 MG-UNIT tablet Take by mouth.    . COMBIVENT RESPIMAT 20-100 MCG/ACT AERS respimat     . DULoxetine (CYMBALTA) 30 MG capsule Take by mouth.    . Fluticasone Furoate-Vilanterol (BREO ELLIPTA) 100-25 MCG/INH AEPB Inhale into the lungs.    . gabapentin (NEURONTIN) 300 MG capsule Take by mouth.    . lamoTRIgine (LAMICTAL) 100 MG tablet Take 100 mg by mouth daily.    . Multiple Vitamin (MULTI-VITAMINS) TABS Take by mouth.    . QUEtiapine (SEROQUEL) 200 MG tablet Take 1 tablet by mouth at bedtime.  2  . SUBOXONE 8-2 MG FILM      No current facility-administered medications for this visit.    PHYSICAL EXAMINATION: ECOG PERFORMANCE STATUS: 0 - Asymptomatic  BP 127/72 mmHg  Pulse 116  Temp(Src) 97.9 F (36.6 C) (Tympanic)  Resp 23  Wt 202 lb 2.6 oz (91.7 kg)  SpO2 95%  PF 2 L/min  Filed Weights   12/06/15 1504  Weight: 202 lb 2.6 oz (91.7 kg)    GENERAL: Well-nourished well-developed; Alert, no distress and comfortable.Accompanied by her  husband. She is anxious. She is on 2 L of nasal cannula oxygen. EYES: no pallor or icterus OROPHARYNX: no thrush or ulceration; good dentition  NECK: supple, no masses felt LYMPH:  no palpable lymphadenopathy in the cervical, axillary or inguinal regions LUNGS: Decreased air entry bilaterally.  No wheeze or crackles HEART/CVS: regular rate & rhythm and no murmurs; No lower extremity edema ABDOMEN:abdomen soft, non-tender and normal bowel sounds Musculoskeletal:no cyanosis of digits and no clubbing  PSYCH: alert & oriented x 3 with fluent speech NEURO: no focal motor/sensory deficits SKIN:  no rashes or significant lesions   LABORATORY DATA:  I have reviewed the data as listed    Component Value  Date/Time   NA 135 12/06/2015 1455   NA 133* 07/04/2013 1216   K 4.3 12/06/2015 1455   K 3.5 07/04/2013 1216   CL 95* 12/06/2015 1455   CL 99 07/04/2013 1216   CO2 32 12/06/2015 1455   CO2 30 07/04/2013 1216   GLUCOSE 146* 12/06/2015 1455   GLUCOSE 100* 07/04/2013 1216   BUN 10 12/06/2015 1455   BUN 15 07/04/2013 1216   CREATININE 0.86 12/06/2015 1455   CREATININE 0.83 10/12/2014 1239   CALCIUM 10.0 12/06/2015 1455   CALCIUM 8.8 07/04/2013 1216   PROT 9.4* 12/06/2015 1455   PROT 8.5* 10/12/2014 1239   ALBUMIN 4.1 12/06/2015 1455   ALBUMIN 4.1 10/12/2014 1239   AST 66* 12/06/2015 1455   AST 44* 10/12/2014 1239   ALT 37 12/06/2015 1455   ALT 28 10/12/2014 1239   ALKPHOS 104 12/06/2015 1455   ALKPHOS 69 10/12/2014 1239   BILITOT 0.8 12/06/2015 1455   BILITOT 0.6 10/12/2014 1239   GFRNONAA >60 12/06/2015 1455   GFRNONAA >60 10/12/2014 1239   GFRNONAA 57* 05/06/2014 1207   GFRAA >60 12/06/2015 1455   GFRAA >60 10/12/2014 1239   GFRAA >60 05/06/2014 1207    No results found for: SPEP, UPEP  Lab Results  Component Value Date   WBC 11.1* 12/06/2015   NEUTROABS 8.4* 12/06/2015   HGB 16.0 12/06/2015   HCT 46.7 12/06/2015   MCV 106.2* 12/06/2015   PLT 265 12/06/2015      Chemistry      Component Value Date/Time   NA 135 12/06/2015 1455   NA 133* 07/04/2013 1216   K 4.3 12/06/2015 1455   K 3.5 07/04/2013 1216   CL 95* 12/06/2015 1455   CL 99 07/04/2013 1216   CO2 32 12/06/2015 1455   CO2 30 07/04/2013 1216   BUN 10 12/06/2015 1455   BUN 15 07/04/2013 1216   CREATININE 0.86 12/06/2015 1455   CREATININE 0.83 10/12/2014 1239      Component Value Date/Time   CALCIUM 10.0 12/06/2015 1455   CALCIUM 8.8 07/04/2013 1216   ALKPHOS 104 12/06/2015 1455   ALKPHOS 69 10/12/2014 1239   AST 66* 12/06/2015 1455   AST 44* 10/12/2014 1239   ALT 37 12/06/2015 1455   ALT 28 10/12/2014 1239   BILITOT 0.8 12/06/2015 1455   BILITOT 0.6 10/12/2014 1239         ASSESSMENT  & PLAN:   # RIGHT BREAST CA STAGE II ER/PR- Pos; her 2 NEG; clinically no evidence of recurrence noted; STOPPED  aromatase inhibitor Secondary to intolerance.   # Left upper lobe lung nodule- approximately one centimeter in size.This is new with this from 2013. This is slightly grown from November 2015. This is not very PET avid. Discussed the options of biopsy. We'll  review the imaging/recommendations from tumor conference next week. Likely get imaging in 6 months.  # Peripheral neuropathy grade 2 continue Neurontin. New prescription given.  # Macrocytosis noted likely from alcohol.  # Patient will follow-up with me in approximately 3 months with labs. However we will contact her sooner if recommendations change from tumor conference. Reviewed the images myself and the patient. Also discussed with Dr. Faith Rogue.  # 25 minutes face-to-face with the patient discussing the above plan of care; more than 50% of time spent on prognosis/ natural history; counseling and coordination.      Cammie Sickle, MD 12/06/2015 3:28 PM

## 2015-12-06 NOTE — Progress Notes (Signed)
Hx Breast Cancer. New lung mass. Severe dyspnea related to COPD. On oxygen 2 liters via [place] continuously. Sweats profusely with dyspnea. Cannot tolerate physical activity d/t breathing difficulty. Reports stopping her AI 3 months ago. States she tried 3 different ones and they all make her "very crazy, out of her mind". Fingertips completely numb. Feet also have ongoing neuropathy from prior chemotherapy. Has generalized discomfort denies actual pain.

## 2015-12-06 NOTE — Progress Notes (Signed)
Pt being referred back by Dr. Genevive Bi for mgmt of new findings of lung mass on ct scan of chest.  H/o of breast cancer. Patient last took her Aromatase inhibitors more than 3-4 months ago.

## 2015-12-07 LAB — CA 27.29 (SERIAL MONITOR): CA 27.29: 44.1 U/mL — AB (ref 0.0–38.6)

## 2016-03-06 ENCOUNTER — Inpatient Hospital Stay: Payer: BLUE CROSS/BLUE SHIELD | Admitting: Internal Medicine

## 2016-03-14 ENCOUNTER — Telehealth: Payer: Self-pay | Admitting: *Deleted

## 2016-03-14 MED ORDER — DULOXETINE HCL 30 MG PO CPEP: 30.0000 mg | ORAL_CAPSULE | Freq: Two times a day (BID) | ORAL | 0 refills | Status: DC

## 2016-03-14 NOTE — Addendum Note (Signed)
Addended by: Betti Cruz on: 03/14/2016 02:28 PM   Modules accepted: Orders

## 2016-03-14 NOTE — Telephone Encounter (Signed)
Already sent.

## 2016-03-29 ENCOUNTER — Inpatient Hospital Stay: Payer: BLUE CROSS/BLUE SHIELD | Admitting: Internal Medicine

## 2016-04-12 ENCOUNTER — Ambulatory Visit: Payer: BLUE CROSS/BLUE SHIELD | Admitting: Internal Medicine

## 2016-04-18 ENCOUNTER — Inpatient Hospital Stay: Payer: BLUE CROSS/BLUE SHIELD | Attending: Internal Medicine | Admitting: Internal Medicine

## 2016-04-18 ENCOUNTER — Encounter: Payer: Self-pay | Admitting: Internal Medicine

## 2016-04-18 DIAGNOSIS — F1910 Other psychoactive substance abuse, uncomplicated: Secondary | ICD-10-CM | POA: Insufficient documentation

## 2016-04-18 DIAGNOSIS — C50811 Malignant neoplasm of overlapping sites of right female breast: Secondary | ICD-10-CM

## 2016-04-18 DIAGNOSIS — Z87891 Personal history of nicotine dependence: Secondary | ICD-10-CM | POA: Diagnosis not present

## 2016-04-18 DIAGNOSIS — B1920 Unspecified viral hepatitis C without hepatic coma: Secondary | ICD-10-CM | POA: Diagnosis not present

## 2016-04-18 DIAGNOSIS — F419 Anxiety disorder, unspecified: Secondary | ICD-10-CM | POA: Insufficient documentation

## 2016-04-18 DIAGNOSIS — J449 Chronic obstructive pulmonary disease, unspecified: Secondary | ICD-10-CM | POA: Diagnosis not present

## 2016-04-18 DIAGNOSIS — I10 Essential (primary) hypertension: Secondary | ICD-10-CM | POA: Diagnosis not present

## 2016-04-18 DIAGNOSIS — R911 Solitary pulmonary nodule: Secondary | ICD-10-CM | POA: Diagnosis not present

## 2016-04-18 DIAGNOSIS — Z170 Estrogen receptor positive status [ER+]: Secondary | ICD-10-CM | POA: Diagnosis not present

## 2016-04-18 DIAGNOSIS — Z79899 Other long term (current) drug therapy: Secondary | ICD-10-CM

## 2016-04-18 DIAGNOSIS — D7589 Other specified diseases of blood and blood-forming organs: Secondary | ICD-10-CM

## 2016-04-18 HISTORY — DX: Estrogen receptor positive status (ER+): C50.811

## 2016-04-18 MED ORDER — DULOXETINE HCL 30 MG PO CPEP: 30.0000 mg | ORAL_CAPSULE | Freq: Two times a day (BID) | ORAL | 3 refills | Status: DC

## 2016-04-18 MED ORDER — GABAPENTIN 300 MG PO CAPS: 300.0000 mg | ORAL_CAPSULE | Freq: Three times a day (TID) | ORAL | 6 refills | Status: DC

## 2016-04-18 NOTE — Progress Notes (Signed)
Patient states that she has been on Duloxetine 30 mg twice daily. She prefers a RF on this and would like a 90 days supply of this.  Spoke with MD. V/o to send this rx renewal to [facility].
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
## 2016-04-19 ENCOUNTER — Other Ambulatory Visit: Payer: Self-pay | Admitting: *Deleted

## 2016-04-19 DIAGNOSIS — C50811 Malignant neoplasm of overlapping sites of right female breast: Secondary | ICD-10-CM

## 2016-04-19 DIAGNOSIS — Z170 Estrogen receptor positive status [ER+]: Principal | ICD-10-CM

## 2016-04-19 MED ORDER — GABAPENTIN 300 MG PO CAPS: 300.0000 mg | ORAL_CAPSULE | Freq: Three times a day (TID) | ORAL | 1 refills | Status: DC

## 2016-04-20 ENCOUNTER — Other Ambulatory Visit: Payer: Self-pay | Admitting: Internal Medicine

## 2016-10-03 ENCOUNTER — Other Ambulatory Visit: Payer: Self-pay | Admitting: Specialist

## 2016-10-03 ENCOUNTER — Ambulatory Visit
Admission: RE | Admit: 2016-10-03 | Discharge: 2016-10-03 | Disposition: A | Discharge status: Home / Self Care | Payer: BLUE CROSS/BLUE SHIELD | Source: Ambulatory Visit | Attending: Specialist | Admitting: Specialist

## 2016-10-03 ENCOUNTER — Other Ambulatory Visit
Admission: RE | Admit: 2016-10-03 | Discharge: 2016-10-03 | Disposition: A | Discharge status: Home / Self Care | Payer: BLUE CROSS/BLUE SHIELD | Source: Ambulatory Visit | Attending: Specialist | Admitting: Specialist

## 2016-10-03 DIAGNOSIS — R079 Chest pain, unspecified: Secondary | ICD-10-CM

## 2016-10-03 DIAGNOSIS — R791 Abnormal coagulation profile: Secondary | ICD-10-CM | POA: Insufficient documentation

## 2016-10-03 DIAGNOSIS — J432 Centrilobular emphysema: Secondary | ICD-10-CM | POA: Diagnosis not present

## 2016-10-03 DIAGNOSIS — R7989 Other specified abnormal findings of blood chemistry: Secondary | ICD-10-CM

## 2016-10-03 DIAGNOSIS — J984 Other disorders of lung: Secondary | ICD-10-CM | POA: Insufficient documentation

## 2016-10-03 LAB — FIBRIN DERIVATIVES D-DIMER (ARMC ONLY): Fibrin derivatives D-dimer (ARMC): 625.14 — ABNORMAL HIGH (ref 0.00–499.00)

## 2016-10-03 MED ORDER — IOPAMIDOL (ISOVUE-370) INJECTION 76%
75.0000 mL | Freq: Once | INTRAVENOUS | Status: AC | PRN
  Administered 2016-10-03: 75 mL via INTRAVENOUS

## 2016-11-20 ENCOUNTER — Ambulatory Visit: Payer: BLUE CROSS/BLUE SHIELD

## 2016-11-21 ENCOUNTER — Inpatient Hospital Stay: Payer: BLUE CROSS/BLUE SHIELD

## 2016-11-21 ENCOUNTER — Ambulatory Visit: Payer: BLUE CROSS/BLUE SHIELD | Admitting: Internal Medicine

## 2016-11-21 ENCOUNTER — Other Ambulatory Visit: Payer: BLUE CROSS/BLUE SHIELD

## 2016-11-21 ENCOUNTER — Inpatient Hospital Stay: Payer: BLUE CROSS/BLUE SHIELD | Admitting: Internal Medicine
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
## 2016-12-05 ENCOUNTER — Inpatient Hospital Stay: Payer: BLUE CROSS/BLUE SHIELD | Admitting: Internal Medicine

## 2016-12-05 ENCOUNTER — Inpatient Hospital Stay: Payer: BLUE CROSS/BLUE SHIELD

## 2016-12-05 ENCOUNTER — Telehealth: Payer: Self-pay | Admitting: *Deleted

## 2016-12-05 NOTE — Telephone Encounter (Addendum)
Patient no showed for today's visit with Dr. Rogue Bussing. (No show x 2.). R/s next available apt with Dr. Rogue Bussing.

## 2016-12-13 ENCOUNTER — Telehealth: Payer: Self-pay | Admitting: *Deleted

## 2016-12-13 NOTE — Telephone Encounter (Signed)
-----   Message from Bill Salinas sent at 12/13/2016 10:34 AM EDT ----- Regarding: MAMMO Contact: [phone] Pt called and needs a Mammo set up- she stated last one was more involved than the typical MM-maybe diagnostic??  Could you look in her chart and make her an annual?  I didn't see any orders for this...  Also pt wants a callback about the date and time of this MM- thx

## 2016-12-13 NOTE — Telephone Encounter (Signed)
Pt over due for her mammogram. Pt would need a Diagnostic mammogram is suggested in 1 year from last mammo. RN will speak to on call doctor to obtain orders.

## 2016-12-16 NOTE — Telephone Encounter (Signed)
  Sounds fine.  M 

## 2016-12-16 NOTE — Telephone Encounter (Signed)
Dr. Mike Gip, may I order a dx mammo under your care on behalf of Dr. Rogue Bussing?

## 2016-12-17 ENCOUNTER — Other Ambulatory Visit: Payer: Self-pay | Admitting: *Deleted

## 2016-12-17 DIAGNOSIS — C50811 Malignant neoplasm of overlapping sites of right female breast: Secondary | ICD-10-CM

## 2016-12-17 DIAGNOSIS — Z170 Estrogen receptor positive status [ER+]: Principal | ICD-10-CM

## 2016-12-17 NOTE — Telephone Encounter (Signed)
Per v/o Dr. Mike Gip - Orders entered on behalf of Dr. Rogue Bussing pt. msg sent to sch. Team to arrange mammogram apts.

## 2016-12-23 ENCOUNTER — Telehealth: Payer: Self-pay | Admitting: *Deleted

## 2016-12-23 NOTE — Telephone Encounter (Signed)
Called pt and left voicemail for appt for mammogram 7/15 at 2:20.  Asked her to call back with confirmation of the appt and she can make it.

## 2016-12-27 ENCOUNTER — Telehealth: Payer: Self-pay | Admitting: *Deleted

## 2016-12-27 NOTE — Telephone Encounter (Signed)
-----   Message from [place] sent at 12/20/2016  2:43 PM EDT ----- They are gone for the day will do Monday ----- Message ----- From: Luella Cook, RN Sent: 12/20/2016   2:32 PM To: Tiana Loft Dixon  Pt needed a mammogram appt and Renita Papa got [facility] to put it in but I do not believe you were told to order it. Please call pt with mammogram. She is anxiously awaiting it. She has some anxiety issues

## 2016-12-27 NOTE — Telephone Encounter (Signed)
I called and got in touch with pt and told her about the mammogram and the date and time in July.  She got my previous message and she forgot to call me and tell me that she got my message and she put the dates in computer

## 2017-01-13 ENCOUNTER — Inpatient Hospital Stay: Payer: BLUE CROSS/BLUE SHIELD

## 2017-01-13 ENCOUNTER — Other Ambulatory Visit: Payer: Self-pay

## 2017-01-13 ENCOUNTER — Inpatient Hospital Stay: Payer: BLUE CROSS/BLUE SHIELD | Attending: Internal Medicine | Admitting: Internal Medicine

## 2017-01-13 DIAGNOSIS — Z79899 Other long term (current) drug therapy: Secondary | ICD-10-CM | POA: Diagnosis not present

## 2017-01-13 DIAGNOSIS — F419 Anxiety disorder, unspecified: Secondary | ICD-10-CM

## 2017-01-13 DIAGNOSIS — Z87891 Personal history of nicotine dependence: Secondary | ICD-10-CM | POA: Diagnosis not present

## 2017-01-13 DIAGNOSIS — Z170 Estrogen receptor positive status [ER+]: Secondary | ICD-10-CM

## 2017-01-13 DIAGNOSIS — Z9981 Dependence on supplemental oxygen: Secondary | ICD-10-CM | POA: Insufficient documentation

## 2017-01-13 DIAGNOSIS — G629 Polyneuropathy, unspecified: Secondary | ICD-10-CM | POA: Diagnosis not present

## 2017-01-13 DIAGNOSIS — I10 Essential (primary) hypertension: Secondary | ICD-10-CM | POA: Diagnosis not present

## 2017-01-13 DIAGNOSIS — J449 Chronic obstructive pulmonary disease, unspecified: Secondary | ICD-10-CM | POA: Diagnosis not present

## 2017-01-13 DIAGNOSIS — Z9221 Personal history of antineoplastic chemotherapy: Secondary | ICD-10-CM

## 2017-01-13 DIAGNOSIS — F1410 Cocaine abuse, uncomplicated: Secondary | ICD-10-CM | POA: Insufficient documentation

## 2017-01-13 DIAGNOSIS — D7589 Other specified diseases of blood and blood-forming organs: Secondary | ICD-10-CM

## 2017-01-13 DIAGNOSIS — R911 Solitary pulmonary nodule: Secondary | ICD-10-CM | POA: Diagnosis not present

## 2017-01-13 DIAGNOSIS — F1110 Opioid abuse, uncomplicated: Secondary | ICD-10-CM | POA: Insufficient documentation

## 2017-01-13 DIAGNOSIS — B1920 Unspecified viral hepatitis C without hepatic coma: Secondary | ICD-10-CM | POA: Insufficient documentation

## 2017-01-13 DIAGNOSIS — F1210 Cannabis abuse, uncomplicated: Secondary | ICD-10-CM | POA: Diagnosis not present

## 2017-01-13 DIAGNOSIS — C50811 Malignant neoplasm of overlapping sites of right female breast: Secondary | ICD-10-CM

## 2017-01-13 LAB — CBC WITH DIFFERENTIAL/PLATELET
BASOS ABS: 0 10*3/uL (ref 0–0.1)
Basophils Relative: 1 %
EOS PCT: 3 %
Eosinophils Absolute: 0.2 10*3/uL (ref 0–0.7)
HEMATOCRIT: 38.4 % (ref 35.0–47.0)
HEMOGLOBIN: 13.5 g/dL (ref 12.0–16.0)
LYMPHS ABS: 1.3 10*3/uL (ref 1.0–3.6)
Lymphocytes Relative: 21 %
MCH: 35.2 pg — AB (ref 26.0–34.0)
MCHC: 35.1 g/dL (ref 32.0–36.0)
MCV: 100.2 fL — AB (ref 80.0–100.0)
Monocytes Absolute: 0.5 10*3/uL (ref 0.2–0.9)
Monocytes Relative: 8 %
NEUTROS ABS: 4.2 10*3/uL (ref 1.4–6.5)
NEUTROS PCT: 67 %
Platelets: 232 10*3/uL (ref 150–440)
RBC: 3.83 MIL/uL (ref 3.80–5.20)
RDW: 13.3 % (ref 11.5–14.5)
WBC: 6.2 10*3/uL (ref 3.6–11.0)

## 2017-01-13 LAB — COMPREHENSIVE METABOLIC PANEL
ALK PHOS: 85 U/L (ref 38–126)
ALT: 29 U/L (ref 14–54)
AST: 50 U/L — AB (ref 15–41)
Albumin: 4.1 g/dL (ref 3.5–5.0)
Anion gap: 7 (ref 5–15)
BUN: 9 mg/dL (ref 6–20)
CALCIUM: 9.8 mg/dL (ref 8.9–10.3)
CO2: 28 mmol/L (ref 22–32)
CREATININE: 1.12 mg/dL — AB (ref 0.44–1.00)
Chloride: 103 mmol/L (ref 101–111)
GFR, EST NON AFRICAN AMERICAN: 52 mL/min — AB (ref >60)
Glucose, Bld: 117 mg/dL — ABNORMAL HIGH (ref 65–99)
Potassium: 4.4 mmol/L (ref 3.5–5.1)
Sodium: 138 mmol/L (ref 135–145)
Total Bilirubin: 0.7 mg/dL (ref 0.3–1.2)
Total Protein: 8.4 g/dL — ABNORMAL HIGH (ref 6.5–8.1)

## 2017-01-13 NOTE — Progress Notes (Signed)
[facility] OFFICE PROGRESS NOTE  Patient Care Team: Juluis Pitch, MD as PCP - General (Family Medicine)   Oncology History   # RIGHT BREAST STAGE II [pT1psN1; neg 15LN s/p ALND] s/p Lumpec [Dr.Smith] s/p RT; AC-taxol; [name]; April 2016];STOPPED AI on Jan 2017.  April 2016- Mammo NEG  # LUL lung nodule- [poor surgical candidate]  # PN- G-2 on Neurontin  # Hot flashes- sec to AI. COPD- on 2 Lit [place] [Dr.Fleming]     Carcinoma of overlapping sites of right breast in female, estrogen receptor positive ([place])     INTERVAL HISTORY:  61 -year-old female patient with above history of stage II breast cancer;COPD on home O2 Lung nodule for follow-up. Patient is not taking her aromatase inhibitor because of multiple side effects/intolerance.  Denies any lumps or bumps.  Chronic mild shortness of breath. Chronic cough. Not any worse. Chronic tingling and numbness. Not any worse   REVIEW OF SYSTEMS:  A complete 10 point review of system is done which is negative except mentioned above/history of present illness.   PAST MEDICAL HISTORY :  Past Medical History:  Diagnosis Date  . Abnormal CT scan, chest   . Anxiety   . Asthma   . Breast cancer ([place])    Breast CA- Right   . Breast cancer ([place]) 06/18/2011   right breast cancer  . COPD (chronic obstructive pulmonary disease) ([place])   . Endometriosis    tx with vaginal hysterectomy  . Fatty liver   . Hepatitis C 1998   [facility] trial treatment in-patient study 2005. HCV 740, 05/20/11.  [name] History of substance abuse    Heroine, cocaine, marijuana. States all of them. Heavy use 1995 to 2000. Occasional use before and after. None since 2006.   [name] Hypertension   . Lung mass   . Peripheral neuropathy (HCC)    in hands and feet    PAST SURGICAL HISTORY :   Past Surgical History:  Procedure Laterality Date  . BREAST LUMPECTOMY Right 2013  . TUBAL LIGATION  1996  . Upper right leg benign tumor removed    . VAGINAL  HYSTERECTOMY  2001    FAMILY HISTORY :   Family History  Problem Relation Age of Onset  . Diabetes Maternal Grandmother   . Lung cancer Father   . Lung cancer Mother   . Hypertension Mother   . Heart disease Mother   . Skin cancer Mother     SOCIAL HISTORY:   Social History  Substance Use Topics  . Smoking status: Former Smoker    Packs/day: 0.25    Years: 44.00    Types: Cigarettes    Quit date: 03/21/2016  . Smokeless tobacco: Never Used     Comment: last cigeratte use 6 days ago (11/30/15)  . Alcohol use 0.0 oz/week    ALLERGIES:  is allergic to penicillins.  MEDICATIONS:  Current Outpatient Prescriptions  Medication Sig Dispense Refill  . albuterol (PROVENTIL) (2.5 MG/3ML) 0.083% nebulizer solution     . Buprenorphine HCl-Naloxone HCl (SUBOXONE) 8-2 MG FILM Take 1 tablet by mouth 3 (three) times daily.    . Calcium Carbonate-Vitamin D 600-400 MG-UNIT tablet Take by mouth.    . COMBIVENT RESPIMAT 20-100 MCG/ACT AERS respimat     . [facility] NICOTINE TRANSDERMAL SYS 14 MG/24HR patch     . DULoxetine (CYMBALTA) 30 MG capsule Take 1 capsule (30 mg total) by mouth 2 (two) times daily. 180 capsule 3  . Fluticasone  Furoate-Vilanterol (BREO ELLIPTA) 100-25 MCG/INH AEPB Inhale into the lungs.    . gabapentin (NEURONTIN) 300 MG capsule Take 1 capsule (300 mg total) by mouth 3 (three) times daily. 180 capsule 1  . lamoTRIgine (LAMICTAL) 100 MG tablet Take 100 mg by mouth daily.    . Multiple Vitamin (MULTI-VITAMINS) TABS Take by mouth.    . OXYGEN Inhale 2 L into the lungs continuous.    [name] QUEtiapine (SEROQUEL) 200 MG tablet Take 1 tablet by mouth at bedtime.  2   No current facility-administered medications for this visit.     PHYSICAL EXAMINATION: ECOG PERFORMANCE STATUS: 0 - Asymptomatic  There were no vitals taken for this visit.  There were no vitals filed for this visit.  GENERAL: Well-nourished well-developed; Alert, no distress and comfortable. She is alone.  She is  anxious. She is on 2 L of nasal cannula oxygen. EYES: no pallor or icterus OROPHARYNX: no thrush or ulceration; good dentition  NECK: supple, no masses felt LYMPH:  no palpable lymphadenopathy in the cervical, axillary or inguinal regions LUNGS: Decreased air entry bilaterally.  No wheeze or crackles HEART/CVS: regular rate & rhythm and no murmurs; No lower extremity edema ABDOMEN:abdomen soft, non-tender and normal bowel sounds Musculoskeletal:no cyanosis of digits and no clubbing  PSYCH: alert & oriented x 3 with fluent speech; anxious/ tearful.  NEURO: no focal motor/sensory deficits SKIN:  no rashes or significant lesions Right and left BREAST exam [in the presence of nurse]- no unusual skin changes or dominant masses felt. Surgical scars noted.     LABORATORY DATA:  I have reviewed the data as listed    Component Value Date/Time   NA 138 01/13/2017 1513   NA 133 (L) 07/04/2013 1216   K 4.4 01/13/2017 1513   K 3.5 07/04/2013 1216   CL 103 01/13/2017 1513   CL 99 07/04/2013 1216   CO2 28 01/13/2017 1513   CO2 30 07/04/2013 1216   GLUCOSE 117 (H) 01/13/2017 1513   GLUCOSE 100 (H) 07/04/2013 1216   BUN 9 01/13/2017 1513   BUN 15 07/04/2013 1216   CREATININE 1.12 (H) 01/13/2017 1513   CREATININE 0.83 10/12/2014 1239   CALCIUM 9.8 01/13/2017 1513   CALCIUM 8.8 07/04/2013 1216   PROT 8.4 (H) 01/13/2017 1513   PROT 8.5 (H) 10/12/2014 1239   ALBUMIN 4.1 01/13/2017 1513   ALBUMIN 4.1 10/12/2014 1239   AST 50 (H) 01/13/2017 1513   AST 44 (H) 10/12/2014 1239   ALT 29 01/13/2017 1513   ALT 28 10/12/2014 1239   ALKPHOS 85 01/13/2017 1513   ALKPHOS 69 10/12/2014 1239   BILITOT 0.7 01/13/2017 1513   BILITOT 0.6 10/12/2014 1239   GFRNONAA 52 (L) 01/13/2017 1513   GFRNONAA >60 10/12/2014 1239   GFRAA >60 01/13/2017 1513   GFRAA >60 10/12/2014 1239    No results found for: SPEP, UPEP  Lab Results  Component Value Date   WBC 6.2 01/13/2017   NEUTROABS 4.2 01/13/2017   HGB  13.5 01/13/2017   HCT 38.4 01/13/2017   MCV 100.2 (H) 01/13/2017   PLT 232 01/13/2017      Chemistry      Component Value Date/Time   NA 138 01/13/2017 1513   NA 133 (L) 07/04/2013 1216   K 4.4 01/13/2017 1513   K 3.5 07/04/2013 1216   CL 103 01/13/2017 1513   CL 99 07/04/2013 1216   CO2 28 01/13/2017 1513   CO2 30 07/04/2013 1216   BUN  9 01/13/2017 1513   BUN 15 07/04/2013 1216   CREATININE 1.12 (H) 01/13/2017 1513   CREATININE 0.83 10/12/2014 1239      Component Value Date/Time   CALCIUM 9.8 01/13/2017 1513   CALCIUM 8.8 07/04/2013 1216   ALKPHOS 85 01/13/2017 1513   ALKPHOS 69 10/12/2014 1239   AST 50 (H) 01/13/2017 1513   AST 44 (H) 10/12/2014 1239   ALT 29 01/13/2017 1513   ALT 28 10/12/2014 1239   BILITOT 0.7 01/13/2017 1513   BILITOT 0.6 10/12/2014 1239         ASSESSMENT & PLAN:  Carcinoma of overlapping sites of right breast in female, estrogen receptor positive ([place])  # RIGHT BREAST CA STAGE II ER/PR- Pos; her 2 NEG; clinically no evidence of recurrence noted; STOPPED  aromatase inhibitor Secondary to intolerance.   # Left upper lobe lung nodule- approximately one centimeter in size.CT scan March 2018- recommend CT non-contrast in march 2020.   # PN-2-3- increase the frequency of Neurontin to 300 mg 3 times a day; continue duloxetine   # anxiety/ depression/ memory issues-stable.  # Macrocytosis noted likely from alcohol.  #  follow up in 6 moths; no labs. mammo- due this month.       Cammie Sickle, MD 01/13/2017 6:20 PM

## 2017-01-13 NOTE — Progress Notes (Signed)
Patient here today for follow up.   

## 2017-01-13 NOTE — Assessment & Plan Note (Addendum)
#   RIGHT BREAST CA STAGE II ER/PR- Pos; her 2 NEG; clinically no evidence of recurrence noted; STOPPED  aromatase inhibitor Secondary to intolerance.   # Left upper lobe lung nodule- approximately one centimeter in size.CT scan March 2018- recommend CT non-contrast in march 2020.   # PN-2-3- increase the frequency of Neurontin to 300 mg 3 times a day; continue duloxetine   # anxiety/ depression/ memory issues-stable.  # Macrocytosis noted likely from alcohol.  #  follow up in 6 moths; no labs. mammo- due this month.

## 2017-01-22 ENCOUNTER — Other Ambulatory Visit: Payer: BLUE CROSS/BLUE SHIELD

## 2017-01-22 ENCOUNTER — Ambulatory Visit: Payer: BLUE CROSS/BLUE SHIELD | Attending: Hematology and Oncology

## 2017-02-27 ENCOUNTER — Other Ambulatory Visit: Payer: Self-pay | Admitting: *Deleted

## 2017-02-27 ENCOUNTER — Other Ambulatory Visit: Payer: Self-pay | Admitting: Internal Medicine

## 2017-02-27 DIAGNOSIS — C50811 Malignant neoplasm of overlapping sites of right female breast: Secondary | ICD-10-CM

## 2017-02-27 DIAGNOSIS — Z170 Estrogen receptor positive status [ER+]: Principal | ICD-10-CM

## 2017-02-27 MED ORDER — GABAPENTIN 300 MG PO CAPS: 300.0000 mg | ORAL_CAPSULE | Freq: Three times a day (TID) | ORAL | 1 refills | Status: DC

## 2017-05-21 ENCOUNTER — Ambulatory Visit
Admission: RE | Admit: 2017-05-21 | Discharge: 2017-05-21 | Disposition: A | Discharge status: Home / Self Care | Payer: Self-pay | Source: Ambulatory Visit | Attending: Hematology and Oncology | Admitting: Hematology and Oncology

## 2017-05-21 DIAGNOSIS — Z170 Estrogen receptor positive status [ER+]: Principal | ICD-10-CM

## 2017-05-21 DIAGNOSIS — C50811 Malignant neoplasm of overlapping sites of right female breast: Secondary | ICD-10-CM

## 2017-07-16 ENCOUNTER — Encounter: Payer: Self-pay | Admitting: Internal Medicine

## 2017-07-16 ENCOUNTER — Inpatient Hospital Stay: Payer: 59 | Attending: Internal Medicine | Admitting: Internal Medicine

## 2017-07-16 ENCOUNTER — Other Ambulatory Visit: Payer: Self-pay

## 2017-07-16 VITALS — BP 127/82 | HR 76 | Temp 97.6°F | Resp 20

## 2017-07-16 DIAGNOSIS — Z853 Personal history of malignant neoplasm of breast: Secondary | ICD-10-CM | POA: Diagnosis not present

## 2017-07-16 DIAGNOSIS — F418 Other specified anxiety disorders: Secondary | ICD-10-CM | POA: Diagnosis not present

## 2017-07-16 DIAGNOSIS — C50811 Malignant neoplasm of overlapping sites of right female breast: Secondary | ICD-10-CM

## 2017-07-16 DIAGNOSIS — G629 Polyneuropathy, unspecified: Secondary | ICD-10-CM | POA: Diagnosis not present

## 2017-07-16 DIAGNOSIS — D7589 Other specified diseases of blood and blood-forming organs: Secondary | ICD-10-CM | POA: Diagnosis not present

## 2017-07-16 DIAGNOSIS — Z170 Estrogen receptor positive status [ER+]: Secondary | ICD-10-CM | POA: Diagnosis not present

## 2017-07-16 DIAGNOSIS — R911 Solitary pulmonary nodule: Secondary | ICD-10-CM

## 2017-07-16 NOTE — Progress Notes (Signed)
Patient had labs recently drawn at [facility] at pcp office. Patient is up to date on her mammogram. Patient has no new medical concerns.  Currently on oxygen therapy-2 Liters continuous for shortness of breath.

## 2017-07-16 NOTE — Progress Notes (Signed)
[facility] OFFICE PROGRESS NOTE  Patient Care Team: Juluis Pitch, MD as PCP - General (Family Medicine)   Oncology History   # RIGHT BREAST STAGE II [pT1psN1; neg 15LN s/p ALND] s/p Lumpec [Dr.Smith] s/p RT; AC-taxol; [name]; April 2016];STOPPED AI on Jan 2017.  April 2016- Mammo NEG  # LUL lung nodule- [poor surgical candidate]  # PN- G-2 on Neurontin  # Hot flashes- sec to AI. COPD- on 2 Lit [place] [Dr.Fleming]     Carcinoma of overlapping sites of right breast in female, estrogen receptor positive ([place])     INTERVAL HISTORY:  62 -year-old female patient with above history of stage II breast cancer;COPD on home O2 Lung nodule for follow-up. Patient is not taking her aromatase inhibitor because of multiple side effects/intolerance.  Patient continues to complain of chronic tingling and numbness in her extremities most in the hands.  She has not had any workup. Denies any lumps or bumps.  Chronic mild shortness of breath. Chronic cough. Not any worse.  Her social situation is better now.   REVIEW OF SYSTEMS:  A complete 10 point review of system is done which is negative except mentioned above/history of present illness.   PAST MEDICAL HISTORY :  Past Medical History:  Diagnosis Date  . Abnormal CT scan, chest   . Anxiety   . Asthma   . Breast cancer ([place])    Breast CA- Right   . Breast cancer ([place]) 06/18/2011   right breast cancer  . COPD (chronic obstructive pulmonary disease) ([place])   . Endometriosis    tx with vaginal hysterectomy  . Fatty liver   . Hepatitis C 1998   [facility] trial treatment in-patient study 2005. HCV 740, 05/20/11.  [name] History of substance abuse    Heroine, cocaine, marijuana. States all of them. Heavy use 1995 to 2000. Occasional use before and after. None since 2006.   [name] Hypertension   . Lung mass   . Peripheral neuropathy    in hands and feet    PAST SURGICAL HISTORY :   Past Surgical History:  Procedure Laterality  Date  . BREAST LUMPECTOMY Right 2013  . TUBAL LIGATION  1996  . Upper right leg benign tumor removed    . VAGINAL HYSTERECTOMY  2001    FAMILY HISTORY :   Family History  Problem Relation Age of Onset  . Diabetes Maternal Grandmother   . Lung cancer Father   . Lung cancer Mother   . Hypertension Mother   . Heart disease Mother   . Skin cancer Mother     SOCIAL HISTORY:   Social History   Tobacco Use  . Smoking status: Former Smoker    Packs/day: 0.25    Years: 44.00    Pack years: 11.00    Types: Cigarettes    Last attempt to quit: 03/21/2016    Years since quitting: 1.3  . Smokeless tobacco: Never Used  . Tobacco comment: last cigeratte use 6 days ago (11/30/15)  Substance Use Topics  . Alcohol use: Yes    Alcohol/week: 0.0 oz  . Drug use: No    ALLERGIES:  is allergic to penicillins.  MEDICATIONS:  Current Outpatient Medications  Medication Sig Dispense Refill  . albuterol (PROVENTIL) (2.5 MG/3ML) 0.083% nebulizer solution Take 2.5 mg by nebulization every 6 (six) hours as needed for wheezing.     . COMBIVENT RESPIMAT 20-100 MCG/ACT AERS respimat     . DULoxetine (CYMBALTA) 30 MG capsule  Take 1 capsule (30 mg total) by mouth 2 (two) times daily. 180 capsule 3  . gabapentin (NEURONTIN) 300 MG capsule Take 1 capsule (300 mg total) by mouth 3 (three) times daily. 180 capsule 1  . metoprolol succinate (TOPROL-XL) 100 MG 24 hr tablet Take 100 mg by mouth daily. Take with or immediately following a meal.    . Multiple Vitamin (MULTI-VITAMINS) TABS Take by mouth.    . OXYGEN Inhale 2 L into the lungs continuous.    . Buprenorphine HCl-Naloxone HCl (SUBOXONE) 8-2 MG FILM Take 1 tablet by mouth 3 (three) times daily.     No current facility-administered medications for this visit.     PHYSICAL EXAMINATION: ECOG PERFORMANCE STATUS: 0 - Asymptomatic  BP 127/82   Pulse 76   Temp 97.6 F (36.4 C) (Tympanic)   Resp 20   There were no vitals filed for this  visit.  GENERAL: Well-nourished well-developed; Alert, no distress and comfortable. She is alone.  She is anxious. She is on 2 L of nasal cannula oxygen. EYES: no pallor or icterus OROPHARYNX: no thrush or ulceration; good dentition  NECK: supple, no masses felt LYMPH:  no palpable lymphadenopathy in the cervical, axillary or inguinal regions LUNGS: Decreased air entry bilaterally.  No wheeze or crackles HEART/CVS: regular rate & rhythm and no murmurs; No lower extremity edema ABDOMEN:abdomen soft, non-tender and normal bowel sounds Musculoskeletal:no cyanosis of digits and no clubbing  PSYCH: alert & oriented x 3 with fluent speech; anxious/ tearful.  NEURO: no focal motor/sensory deficits SKIN:  no rashes or significant lesions Right and left BREAST exam [in the presence of nurse]- no unusual skin changes or dominant masses felt. Surgical scars noted.     LABORATORY DATA:  I have reviewed the data as listed    Component Value Date/Time   NA 138 01/13/2017 1513   NA 133 (L) 07/04/2013 1216   K 4.4 01/13/2017 1513   K 3.5 07/04/2013 1216   CL 103 01/13/2017 1513   CL 99 07/04/2013 1216   CO2 28 01/13/2017 1513   CO2 30 07/04/2013 1216   GLUCOSE 117 (H) 01/13/2017 1513   GLUCOSE 100 (H) 07/04/2013 1216   BUN 9 01/13/2017 1513   BUN 15 07/04/2013 1216   CREATININE 1.12 (H) 01/13/2017 1513   CREATININE 0.83 10/12/2014 1239   CALCIUM 9.8 01/13/2017 1513   CALCIUM 8.8 07/04/2013 1216   PROT 8.4 (H) 01/13/2017 1513   PROT 8.5 (H) 10/12/2014 1239   ALBUMIN 4.1 01/13/2017 1513   ALBUMIN 4.1 10/12/2014 1239   AST 50 (H) 01/13/2017 1513   AST 44 (H) 10/12/2014 1239   ALT 29 01/13/2017 1513   ALT 28 10/12/2014 1239   ALKPHOS 85 01/13/2017 1513   ALKPHOS 69 10/12/2014 1239   BILITOT 0.7 01/13/2017 1513   BILITOT 0.6 10/12/2014 1239   GFRNONAA 52 (L) 01/13/2017 1513   GFRNONAA >60 10/12/2014 1239   GFRAA >60 01/13/2017 1513   GFRAA >60 10/12/2014 1239    No results found  for: SPEP, UPEP  Lab Results  Component Value Date   WBC 6.2 01/13/2017   NEUTROABS 4.2 01/13/2017   HGB 13.5 01/13/2017   HCT 38.4 01/13/2017   MCV 100.2 (H) 01/13/2017   PLT 232 01/13/2017      Chemistry      Component Value Date/Time   NA 138 01/13/2017 1513   NA 133 (L) 07/04/2013 1216   K 4.4 01/13/2017 1513   K 3.5 07/04/2013  1216   CL 103 01/13/2017 1513   CL 99 07/04/2013 1216   CO2 28 01/13/2017 1513   CO2 30 07/04/2013 1216   BUN 9 01/13/2017 1513   BUN 15 07/04/2013 1216   CREATININE 1.12 (H) 01/13/2017 1513   CREATININE 0.83 10/12/2014 1239      Component Value Date/Time   CALCIUM 9.8 01/13/2017 1513   CALCIUM 8.8 07/04/2013 1216   ALKPHOS 85 01/13/2017 1513   ALKPHOS 69 10/12/2014 1239   AST 50 (H) 01/13/2017 1513   AST 44 (H) 10/12/2014 1239   ALT 29 01/13/2017 1513   ALT 28 10/12/2014 1239   BILITOT 0.7 01/13/2017 1513   BILITOT 0.6 10/12/2014 1239         ASSESSMENT & PLAN:  Carcinoma of overlapping sites of right breast in female, estrogen receptor positive ([place]) # RIGHT BREAST CA STAGE II ER/PR- Pos; her 2 NEG; clinically no evidence of recurrence noted; STOPPED  aromatase inhibitor Secondary to intolerance. mammo-Nov 2018- NEG.    # Left upper lobe lung nodule- approximately one centimeter in size. CT scan March 2018- recommend CT non-contrast in march 2020.   # PN-2-3-improved; on  Neurontin to 300 mg 3 times a day; continue duloxetine   # anxiety/ depression/ memory issues-stable/Improved.   # Macrocytosis noted likely from alcohol.  #  follow up in 6 moths; no labs     Cammie Sickle, MD 07/16/2017 3:28 PM

## 2017-07-16 NOTE — Assessment & Plan Note (Addendum)
#   RIGHT BREAST CA STAGE II ER/PR- Pos; her 2 NEG; clinically no evidence of recurrence noted; STOPPED  aromatase inhibitor Secondary to intolerance. mammo-Nov 2018- NEG.    # Left upper lobe lung nodule- approximately one centimeter in size. CT scan March 2018- recommend CT non-contrast in march 2020.   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  Discussed regarding neuro evaluation; patient reluctant.  # anxiety/ depression/ memory issues-stable/Improved.   # Macrocytosis noted likely from alcohol.  #  follow up in 6 moths; no labs

## 2017-08-26 DIAGNOSIS — J449 Chronic obstructive pulmonary disease, unspecified: Secondary | ICD-10-CM | POA: Diagnosis not present

## 2017-08-26 DIAGNOSIS — R0789 Other chest pain: Secondary | ICD-10-CM | POA: Diagnosis not present

## 2017-08-26 DIAGNOSIS — R0609 Other forms of dyspnea: Secondary | ICD-10-CM | POA: Diagnosis not present

## 2017-08-28 ENCOUNTER — Other Ambulatory Visit: Payer: Self-pay | Admitting: Specialist

## 2017-08-28 DIAGNOSIS — R0609 Other forms of dyspnea: Secondary | ICD-10-CM

## 2017-08-28 DIAGNOSIS — R0789 Other chest pain: Secondary | ICD-10-CM

## 2017-08-28 DIAGNOSIS — R918 Other nonspecific abnormal finding of lung field: Secondary | ICD-10-CM

## 2017-09-03 DIAGNOSIS — J449 Chronic obstructive pulmonary disease, unspecified: Secondary | ICD-10-CM | POA: Diagnosis not present

## 2017-09-17 ENCOUNTER — Ambulatory Visit
Admission: RE | Admit: 2017-09-17 | Discharge: 2017-09-17 | Disposition: A | Discharge status: Home / Self Care | Payer: 59 | Source: Ambulatory Visit | Attending: Specialist | Admitting: Specialist

## 2017-09-17 DIAGNOSIS — R0609 Other forms of dyspnea: Secondary | ICD-10-CM | POA: Insufficient documentation

## 2017-09-17 DIAGNOSIS — K7460 Unspecified cirrhosis of liver: Secondary | ICD-10-CM | POA: Insufficient documentation

## 2017-09-17 DIAGNOSIS — R911 Solitary pulmonary nodule: Secondary | ICD-10-CM | POA: Diagnosis not present

## 2017-09-17 DIAGNOSIS — R0602 Shortness of breath: Secondary | ICD-10-CM | POA: Diagnosis not present

## 2017-09-17 DIAGNOSIS — R918 Other nonspecific abnormal finding of lung field: Secondary | ICD-10-CM | POA: Insufficient documentation

## 2017-09-17 DIAGNOSIS — J439 Emphysema, unspecified: Secondary | ICD-10-CM | POA: Insufficient documentation

## 2017-09-17 DIAGNOSIS — R0789 Other chest pain: Secondary | ICD-10-CM | POA: Diagnosis not present

## 2017-09-17 DIAGNOSIS — I700 Atherosclerosis of aorta: Secondary | ICD-10-CM | POA: Insufficient documentation

## 2017-09-19 ENCOUNTER — Other Ambulatory Visit: Payer: Self-pay | Admitting: *Deleted

## 2017-09-19 DIAGNOSIS — C50811 Malignant neoplasm of overlapping sites of right female breast: Secondary | ICD-10-CM

## 2017-09-19 DIAGNOSIS — Z170 Estrogen receptor positive status [ER+]: Principal | ICD-10-CM

## 2017-09-19 MED ORDER — GABAPENTIN 300 MG PO CAPS: 300.0000 mg | ORAL_CAPSULE | Freq: Three times a day (TID) | ORAL | 1 refills | Status: DC

## 2017-09-19 MED ORDER — DULOXETINE HCL 30 MG PO CPEP: 30.0000 mg | ORAL_CAPSULE | Freq: Two times a day (BID) | ORAL | 3 refills | Status: AC

## 2017-09-19 NOTE — Telephone Encounter (Signed)
Patient changed pharmacy to [facility] from [facility] and needs new prescription for Gabapentin and Duloxetine

## 2017-09-23 ENCOUNTER — Other Ambulatory Visit: Payer: Self-pay | Admitting: Specialist

## 2017-09-23 ENCOUNTER — Encounter: Payer: Self-pay | Admitting: *Deleted

## 2017-09-23 DIAGNOSIS — R911 Solitary pulmonary nodule: Secondary | ICD-10-CM

## 2017-10-01 DIAGNOSIS — J449 Chronic obstructive pulmonary disease, unspecified: Secondary | ICD-10-CM | POA: Diagnosis not present

## 2017-11-01 DIAGNOSIS — J449 Chronic obstructive pulmonary disease, unspecified: Secondary | ICD-10-CM | POA: Diagnosis not present

## 2017-11-12 ENCOUNTER — Other Ambulatory Visit: Payer: Self-pay | Admitting: Internal Medicine

## 2017-11-12 DIAGNOSIS — Z170 Estrogen receptor positive status [ER+]: Principal | ICD-10-CM

## 2017-11-12 DIAGNOSIS — C50811 Malignant neoplasm of overlapping sites of right female breast: Secondary | ICD-10-CM

## 2017-12-01 DIAGNOSIS — J449 Chronic obstructive pulmonary disease, unspecified: Secondary | ICD-10-CM | POA: Diagnosis not present

## 2018-01-01 DIAGNOSIS — J449 Chronic obstructive pulmonary disease, unspecified: Secondary | ICD-10-CM | POA: Diagnosis not present

## 2018-01-14 ENCOUNTER — Emergency Department: Payer: 59

## 2018-01-14 ENCOUNTER — Inpatient Hospital Stay: Payer: 59 | Admitting: Internal Medicine

## 2018-01-14 ENCOUNTER — Other Ambulatory Visit: Payer: Self-pay

## 2018-01-14 ENCOUNTER — Inpatient Hospital Stay
Admission: EM | Admit: 2018-01-14 | Discharge: 2018-01-17 | DRG: 193 | Disposition: A | Discharge status: Home / Self Care | Payer: 59 | Attending: Internal Medicine | Admitting: Internal Medicine

## 2018-01-14 ENCOUNTER — Inpatient Hospital Stay: Payer: 59

## 2018-01-14 DIAGNOSIS — J9620 Acute and chronic respiratory failure, unspecified whether with hypoxia or hypercapnia: Secondary | ICD-10-CM | POA: Diagnosis not present

## 2018-01-14 DIAGNOSIS — G629 Polyneuropathy, unspecified: Secondary | ICD-10-CM | POA: Diagnosis present

## 2018-01-14 DIAGNOSIS — J441 Chronic obstructive pulmonary disease with (acute) exacerbation: Secondary | ICD-10-CM | POA: Diagnosis not present

## 2018-01-14 DIAGNOSIS — F419 Anxiety disorder, unspecified: Secondary | ICD-10-CM | POA: Diagnosis present

## 2018-01-14 DIAGNOSIS — Z853 Personal history of malignant neoplasm of breast: Secondary | ICD-10-CM

## 2018-01-14 DIAGNOSIS — R739 Hyperglycemia, unspecified: Secondary | ICD-10-CM | POA: Diagnosis not present

## 2018-01-14 DIAGNOSIS — E871 Hypo-osmolality and hyponatremia: Secondary | ICD-10-CM | POA: Diagnosis present

## 2018-01-14 DIAGNOSIS — F17210 Nicotine dependence, cigarettes, uncomplicated: Secondary | ICD-10-CM | POA: Diagnosis present

## 2018-01-14 DIAGNOSIS — Z808 Family history of malignant neoplasm of other organs or systems: Secondary | ICD-10-CM

## 2018-01-14 DIAGNOSIS — J189 Pneumonia, unspecified organism: Secondary | ICD-10-CM | POA: Diagnosis not present

## 2018-01-14 DIAGNOSIS — Z90710 Acquired absence of both cervix and uterus: Secondary | ICD-10-CM | POA: Diagnosis not present

## 2018-01-14 DIAGNOSIS — R0602 Shortness of breath: Secondary | ICD-10-CM

## 2018-01-14 DIAGNOSIS — T380X5A Adverse effect of glucocorticoids and synthetic analogues, initial encounter: Secondary | ICD-10-CM | POA: Diagnosis not present

## 2018-01-14 DIAGNOSIS — Z880 Allergy status to penicillin: Secondary | ICD-10-CM | POA: Diagnosis not present

## 2018-01-14 DIAGNOSIS — Z7951 Long term (current) use of inhaled steroids: Secondary | ICD-10-CM

## 2018-01-14 DIAGNOSIS — Z8249 Family history of ischemic heart disease and other diseases of the circulatory system: Secondary | ICD-10-CM

## 2018-01-14 DIAGNOSIS — J440 Chronic obstructive pulmonary disease with acute lower respiratory infection: Secondary | ICD-10-CM | POA: Diagnosis present

## 2018-01-14 DIAGNOSIS — I10 Essential (primary) hypertension: Secondary | ICD-10-CM | POA: Diagnosis present

## 2018-01-14 DIAGNOSIS — Z170 Estrogen receptor positive status [ER+]: Secondary | ICD-10-CM | POA: Diagnosis not present

## 2018-01-14 DIAGNOSIS — Z833 Family history of diabetes mellitus: Secondary | ICD-10-CM

## 2018-01-14 DIAGNOSIS — Z9981 Dependence on supplemental oxygen: Secondary | ICD-10-CM

## 2018-01-14 DIAGNOSIS — E876 Hypokalemia: Secondary | ICD-10-CM | POA: Diagnosis present

## 2018-01-14 DIAGNOSIS — Z801 Family history of malignant neoplasm of trachea, bronchus and lung: Secondary | ICD-10-CM

## 2018-01-14 DIAGNOSIS — R05 Cough: Secondary | ICD-10-CM | POA: Diagnosis not present

## 2018-01-14 DIAGNOSIS — R079 Chest pain, unspecified: Secondary | ICD-10-CM | POA: Diagnosis not present

## 2018-01-14 LAB — CBC WITH DIFFERENTIAL/PLATELET
Basophils Absolute: 0 10*3/uL (ref 0–0.1)
Basophils Relative: 0 %
EOS PCT: 0 %
Eosinophils Absolute: 0.1 10*3/uL (ref 0–0.7)
HEMATOCRIT: 41.7 % (ref 35.0–47.0)
Hemoglobin: 14.3 g/dL (ref 12.0–16.0)
LYMPHS ABS: 0.6 10*3/uL — AB (ref 1.0–3.6)
LYMPHS PCT: 3 %
MCH: 35.5 pg — AB (ref 26.0–34.0)
MCHC: 34.4 g/dL (ref 32.0–36.0)
MCV: 103.3 fL — AB (ref 80.0–100.0)
MONO ABS: 0.6 10*3/uL (ref 0.2–0.9)
Monocytes Relative: 3 %
NEUTROS ABS: 16.7 10*3/uL — AB (ref 1.4–6.5)
Neutrophils Relative %: 94 %
PLATELETS: 235 10*3/uL (ref 150–440)
RBC: 4.03 MIL/uL (ref 3.80–5.20)
RDW: 13 % (ref 11.5–14.5)
WBC: 18 10*3/uL — ABNORMAL HIGH (ref 3.6–11.0)

## 2018-01-14 LAB — COMPREHENSIVE METABOLIC PANEL
ALT: 26 U/L (ref 0–44)
ANION GAP: 9 (ref 5–15)
AST: 42 U/L — AB (ref 15–41)
Albumin: 3.5 g/dL (ref 3.5–5.0)
Alkaline Phosphatase: 122 U/L (ref 38–126)
BUN: 11 mg/dL (ref 8–23)
CHLORIDE: 93 mmol/L — AB (ref 98–111)
CO2: 28 mmol/L (ref 22–32)
Calcium: 9.4 mg/dL (ref 8.9–10.3)
Creatinine, Ser: 1.07 mg/dL — ABNORMAL HIGH (ref 0.44–1.00)
GFR calc Af Amer: >60 mL/min (ref >60)
GFR calc non Af Amer: 55 mL/min — ABNORMAL LOW (ref >60)
Glucose, Bld: 212 mg/dL — ABNORMAL HIGH (ref 70–99)
POTASSIUM: 3.5 mmol/L (ref 3.5–5.1)
SODIUM: 130 mmol/L — AB (ref 135–145)
TOTAL PROTEIN: 8.5 g/dL — AB (ref 6.5–8.1)
Total Bilirubin: 1 mg/dL (ref 0.3–1.2)

## 2018-01-14 LAB — URINALYSIS, COMPLETE (UACMP) WITH MICROSCOPIC
Bilirubin Urine: NEGATIVE
Glucose, UA: NEGATIVE mg/dL
Hgb urine dipstick: NEGATIVE
Ketones, ur: NEGATIVE mg/dL
Leukocytes, UA: NEGATIVE
Nitrite: NEGATIVE
PH: 5 (ref 5.0–8.0)
Protein, ur: NEGATIVE mg/dL
SPECIFIC GRAVITY, URINE: 1.005 (ref 1.005–1.030)

## 2018-01-14 LAB — BLOOD GAS, VENOUS
Acid-Base Excess: 0.4 mmol/L (ref 0.0–2.0)
Bicarbonate: 27 mmol/L (ref 20.0–28.0)
O2 Saturation: 74 %
PATIENT TEMPERATURE: 37
PH VEN: 7.34 (ref 7.250–7.430)
PO2 VEN: 42 mmHg (ref 32.0–45.0)
pCO2, Ven: 50 mmHg (ref 44.0–60.0)

## 2018-01-14 LAB — LACTIC ACID, PLASMA
LACTIC ACID, VENOUS: 1.7 mmol/L (ref 0.5–1.9)
LACTIC ACID, VENOUS: 2.8 mmol/L — AB (ref 0.5–1.9)
Lactic Acid, Venous: 3.7 mmol/L (ref 0.5–1.9)

## 2018-01-14 LAB — GLUCOSE, CAPILLARY
GLUCOSE-CAPILLARY: 352 mg/dL — AB (ref 70–99)
Glucose-Capillary: 171 mg/dL — ABNORMAL HIGH (ref 70–99)
Glucose-Capillary: 366 mg/dL — ABNORMAL HIGH (ref 70–99)

## 2018-01-14 LAB — TROPONIN I

## 2018-01-14 MED ORDER — SODIUM CHLORIDE 0.9 % IV BOLUS: 500.0000 mL | Freq: Once | INTRAVENOUS | Status: DC

## 2018-01-14 MED ORDER — LEVOFLOXACIN 500 MG PO TABS
500.0000 mg | ORAL_TABLET | Freq: Every day | ORAL | Status: DC
  Administered 2018-01-15 – 2018-01-16 (×2): 500 mg via ORAL
  Filled 2018-01-14 (×2): qty 1

## 2018-01-14 MED ORDER — ONDANSETRON HCL 4 MG/2ML IJ SOLN: 4.0000 mg | Freq: Four times a day (QID) | INTRAMUSCULAR | Status: DC | PRN

## 2018-01-14 MED ORDER — IPRATROPIUM-ALBUTEROL 0.5-2.5 (3) MG/3ML IN SOLN
3.0000 mL | Freq: Once | RESPIRATORY_TRACT | Status: AC
  Administered 2018-01-14: 3 mL via RESPIRATORY_TRACT
  Filled 2018-01-14: qty 3

## 2018-01-14 MED ORDER — GUAIFENESIN-DM 100-10 MG/5ML PO SYRP
5.0000 mL | ORAL_SOLUTION | ORAL | Status: DC | PRN
  Administered 2018-01-15 (×2): 5 mL via ORAL
  Filled 2018-01-14 (×3): qty 5

## 2018-01-14 MED ORDER — SODIUM CHLORIDE 0.9 % IV SOLN
INTRAVENOUS | Status: AC
  Administered 2018-01-14 – 2018-01-15 (×2): via INTRAVENOUS

## 2018-01-14 MED ORDER — SODIUM CHLORIDE 0.9 % IV SOLN: 250.0000 mL | INTRAVENOUS | Status: DC | PRN

## 2018-01-14 MED ORDER — ALPRAZOLAM 0.5 MG PO TABS
0.5000 mg | ORAL_TABLET | Freq: Three times a day (TID) | ORAL | Status: DC | PRN
  Administered 2018-01-14 – 2018-01-17 (×9): 0.5 mg via ORAL
  Filled 2018-01-14 (×10): qty 1

## 2018-01-14 MED ORDER — METHYLPREDNISOLONE SODIUM SUCC 125 MG IJ SOLR
60.0000 mg | Freq: Four times a day (QID) | INTRAMUSCULAR | Status: DC
  Administered 2018-01-14 – 2018-01-17 (×11): 60 mg via INTRAVENOUS
  Filled 2018-01-14 (×11): qty 2

## 2018-01-14 MED ORDER — ENOXAPARIN SODIUM 40 MG/0.4ML ~~LOC~~ SOLN
40.0000 mg | SUBCUTANEOUS | Status: DC
  Administered 2018-01-14 – 2018-01-16 (×3): 40 mg via SUBCUTANEOUS
  Filled 2018-01-14 (×3): qty 0.4

## 2018-01-14 MED ORDER — SENNOSIDES-DOCUSATE SODIUM 8.6-50 MG PO TABS: 1.0000 | ORAL_TABLET | Freq: Every evening | ORAL | Status: DC | PRN

## 2018-01-14 MED ORDER — ACETAMINOPHEN 325 MG PO TABS
650.0000 mg | ORAL_TABLET | Freq: Four times a day (QID) | ORAL | Status: DC | PRN
  Administered 2018-01-16: 650 mg via ORAL
  Filled 2018-01-14: qty 2

## 2018-01-14 MED ORDER — INSULIN ASPART 100 UNIT/ML ~~LOC~~ SOLN
0.0000 [IU] | Freq: Three times a day (TID) | SUBCUTANEOUS | Status: DC
  Administered 2018-01-14 – 2018-01-15 (×2): 3 [IU] via SUBCUTANEOUS
  Administered 2018-01-15: 12:00:00 15 [IU] via SUBCUTANEOUS
  Administered 2018-01-15 – 2018-01-16 (×3): 5 [IU] via SUBCUTANEOUS
  Administered 2018-01-16: 09:00:00 3 [IU] via SUBCUTANEOUS
  Administered 2018-01-17: 12:00:00 5 [IU] via SUBCUTANEOUS
  Administered 2018-01-17: 09:00:00 3 [IU] via SUBCUTANEOUS
  Filled 2018-01-14 (×9): qty 1

## 2018-01-14 MED ORDER — SODIUM CHLORIDE 0.9 % IV SOLN
500.0000 mg | Freq: Once | INTRAVENOUS | Status: AC
  Administered 2018-01-14: 500 mg via INTRAVENOUS
  Filled 2018-01-14: qty 500

## 2018-01-14 MED ORDER — SODIUM CHLORIDE 0.9 % IV SOLN
1.0000 g | Freq: Once | INTRAVENOUS | Status: AC
  Administered 2018-01-14: 1 g via INTRAVENOUS
  Filled 2018-01-14: qty 10

## 2018-01-14 MED ORDER — METHYLPREDNISOLONE SODIUM SUCC 125 MG IJ SOLR
125.0000 mg | Freq: Once | INTRAMUSCULAR | Status: AC
  Administered 2018-01-14: 125 mg via INTRAVENOUS
  Filled 2018-01-14: qty 2

## 2018-01-14 MED ORDER — SODIUM CHLORIDE 0.9% FLUSH
3.0000 mL | Freq: Two times a day (BID) | INTRAVENOUS | Status: DC
  Administered 2018-01-14 – 2018-01-17 (×6): 3 mL via INTRAVENOUS

## 2018-01-14 MED ORDER — ONDANSETRON HCL 4 MG PO TABS: 4.0000 mg | ORAL_TABLET | Freq: Four times a day (QID) | ORAL | Status: DC | PRN

## 2018-01-14 MED ORDER — METOPROLOL SUCCINATE ER 50 MG PO TB24
100.0000 mg | ORAL_TABLET | Freq: Every day | ORAL | Status: DC
  Administered 2018-01-15 – 2018-01-17 (×3): 100 mg via ORAL
  Filled 2018-01-14 (×3): qty 2

## 2018-01-14 MED ORDER — SODIUM CHLORIDE 0.9 % IV BOLUS: 1000.0000 mL | Freq: Once | INTRAVENOUS | Status: DC

## 2018-01-14 MED ORDER — DULOXETINE HCL 30 MG PO CPEP
30.0000 mg | ORAL_CAPSULE | Freq: Two times a day (BID) | ORAL | Status: DC
  Administered 2018-01-14 – 2018-01-17 (×6): 30 mg via ORAL
  Filled 2018-01-14 (×7): qty 1

## 2018-01-14 MED ORDER — FUROSEMIDE 10 MG/ML IJ SOLN
40.0000 mg | Freq: Once | INTRAMUSCULAR | Status: AC
  Administered 2018-01-14: 17:00:00 40 mg via INTRAVENOUS
  Filled 2018-01-14: qty 4

## 2018-01-14 MED ORDER — ACETAMINOPHEN 650 MG RE SUPP: 650.0000 mg | Freq: Four times a day (QID) | RECTAL | Status: DC | PRN

## 2018-01-14 MED ORDER — NICOTINE 21 MG/24HR TD PT24
21.0000 mg | MEDICATED_PATCH | Freq: Every day | TRANSDERMAL | Status: DC
  Administered 2018-01-14 – 2018-01-17 (×4): 21 mg via TRANSDERMAL
  Filled 2018-01-14 (×4): qty 1

## 2018-01-14 MED ORDER — SODIUM CHLORIDE 0.9% FLUSH: 3.0000 mL | INTRAVENOUS | Status: DC | PRN

## 2018-01-14 MED ORDER — IPRATROPIUM-ALBUTEROL 0.5-2.5 (3) MG/3ML IN SOLN
3.0000 mL | Freq: Four times a day (QID) | RESPIRATORY_TRACT | Status: DC
  Administered 2018-01-14 – 2018-01-17 (×11): 3 mL via RESPIRATORY_TRACT
  Filled 2018-01-14 (×11): qty 3

## 2018-01-14 MED ORDER — GABAPENTIN 300 MG PO CAPS
300.0000 mg | ORAL_CAPSULE | Freq: Three times a day (TID) | ORAL | Status: DC
  Administered 2018-01-14 – 2018-01-17 (×9): 300 mg via ORAL
  Filled 2018-01-14 (×11): qty 1

## 2018-01-14 NOTE — ED Notes (Signed)
Admitting MD at bedside.

## 2018-01-14 NOTE — Progress Notes (Signed)
Patient presents to clinic with shortness of breath and chest pain, diaphoretic/ reports fevers over 100 last evening. Coughing up yellow sputum.  H/o copd. Currently on 2 L oxygen therapy. HR 144. Temp stable at 98.  Per v/o Dr. Rogue Bussing - transport pt to ER for further evaluation.

## 2018-01-14 NOTE — Progress Notes (Signed)
Advanced care plan.  Purpose of the Encounter: CODE STATUS  Parties in Attendance: Patient and family  Patient's Decision Capacity: Good  Subjective/Patient's story: Presented to emergency room for shortness of breath cough and wheezing   Objective/Medical story Has COPD exacerbation and needs IV Solu-Medrol and nebulization treatments   Goals of care determination:  Advance care directives and goals of care discussed Patient wants CPR and intubation and ventilator if the need arises   CODE STATUS: Full code  Time spent discussing advanced care planning: 16 minutes

## 2018-01-14 NOTE — ED Triage Notes (Signed)
From [facility] for [facility], SOB, diaphoresis, green productive sputum. Pt pale.

## 2018-01-14 NOTE — ED Notes (Signed)
Called xray and informed them pt was ready for xray at this time.

## 2018-01-14 NOTE — Progress Notes (Signed)
Dr Estanislado Pandy made aware that lab called with lactic acid is 2.8, new order to repeat lactic at 2230

## 2018-01-14 NOTE — ED Notes (Signed)
Pt arrives from [facility] for an appt. Not receiving chemo or radiation.   States tmax at home 103. Took aleeve this AM before going to [facility] center. States she has been running high fever at night and low grade fever during the day intermittently since Sunday night.   Wears 2 L [place] as needed at home during day, wears it at night. Arrives on oxygen.

## 2018-01-14 NOTE — ED Notes (Signed)
Pt returned from xray

## 2018-01-14 NOTE — H&P (Addendum)
[place] [facility] [place] NAME: Regina Bernard    MR#:  [id number]  DATE OF BIRTH:  [birth year]  DATE OF ADMISSION:  01/14/2018  PRIMARY CARE PHYSICIAN: Juluis Pitch, MD   REQUESTING/REFERRING PHYSICIAN:   CHIEF COMPLAINT:   Chief Complaint  Patient presents with  . Chest Pain  . Shortness of Breath    HISTORY OF PRESENT ILLNESS: Regina Bernard  is a 62 y.o. female with a known history of COPD on home oxygen, breast cancer, anxiety disorder, hepatitis C presented to the emergency room for shortness of breath and wheezing since Sunday.  Patient also has some cough productive of phlegm.  She still actively smokes.  No complaints of any orthopnea.  Has some chest tightness whenever she takes a deep breath.  Patient was given nebulization treatment and IV steroids in the emergency room patient on home oxygen.[name] Hospitalist service was consulted for further care.  PAST MEDICAL HISTORY:   Past Medical History:  Diagnosis Date  . Abnormal CT scan, chest   . Anxiety   . Asthma   . Breast cancer ([place])    Breast CA- Right   . Breast cancer ([place]) 06/18/2011   right breast cancer  . COPD (chronic obstructive pulmonary disease) ([place])   . Endometriosis    tx with vaginal hysterectomy  . Fatty liver   . Hepatitis C 1998   [facility] trial treatment in-patient study 2005. HCV 740, 05/20/11.  [name] History of substance abuse    Heroine, cocaine, marijuana. States all of them. Heavy use 1995 to 2000. Occasional use before and after. None since 2006.   [name] Hot flashes related to aromatase inhibitor therapy   . Hypertension   . Lung mass   . Peripheral neuropathy    in hands and feet    PAST SURGICAL HISTORY:  Past Surgical History:  Procedure Laterality Date  . BREAST LUMPECTOMY Right 2013  . TUBAL LIGATION  1996  . Upper right leg benign tumor removed    . VAGINAL HYSTERECTOMY  2001    SOCIAL HISTORY:  Social History   Tobacco Use  . Smoking  status: Current Every Day Smoker    Packs/day: 0.25    Years: 44.00    Pack years: 11.00    Types: Cigarettes    Last attempt to quit: 03/21/2016    Years since quitting: 1.8  . Smokeless tobacco: Never Used  . Tobacco comment: last cigeratte use 6 days ago (11/30/15)  Substance Use Topics  . Alcohol use: Yes    Alcohol/week: 0.0 oz    FAMILY HISTORY:  Family History  Problem Relation Age of Onset  . Diabetes Maternal Grandmother   . Lung cancer Father   . Lung cancer Mother   . Hypertension Mother   . Heart disease Mother   . Skin cancer Mother     DRUG ALLERGIES:  Allergies  Allergen Reactions  . Penicillins Swelling and Rash    Has patient had a PCN reaction causing immediate rash, facial/tongue/throat swelling, SOB or lightheadedness with hypotension: Yes Has patient had a PCN reaction causing severe rash involving mucus membranes or skin necrosis: No Has patient had a PCN reaction that required hospitalization: No Has patient had a PCN reaction occurring within the last 10 years: No If all of the above answers are "NO", then may proceed with Cephalosporin use.     REVIEW OF SYSTEMS:   CONSTITUTIONAL: No fever, has fatigue and weakness.  EYES:  No blurred or double vision.  EARS, NOSE, AND THROAT: No tinnitus or ear pain.  RESPIRATORY: Has cough, shortness of breath, wheezing  no hemoptysis.  CARDIOVASCULAR: No chest pain, orthopnea, edema.  GASTROINTESTINAL: No nausea, vomiting, diarrhea or abdominal pain.  GENITOURINARY: No dysuria, hematuria.  ENDOCRINE: No polyuria, nocturia,  HEMATOLOGY: No anemia, easy bruising or bleeding SKIN: No rash or lesion. MUSCULOSKELETAL: No joint pain or arthritis.   NEUROLOGIC: No tingling, numbness, weakness.  PSYCHIATRY: No anxiety or depression.   MEDICATIONS AT HOME:  Prior to Admission medications   Medication Sig Start Date End Date Taking? Authorizing Provider  albuterol (PROVENTIL) (2.5 MG/3ML) 0.083% nebulizer  solution Take 2.5 mg by nebulization every 6 (six) hours as needed for wheezing.  09/16/14  Yes [provider]  BREO ELLIPTA 100-25 MCG/INH AEPB Inhale 1 puff into the lungs daily. 12/26/17  Yes [provider]  COMBIVENT RESPIMAT 20-100 MCG/ACT AERS respimat Inhale 1 puff into the lungs every 6 (six) hours as needed for wheezing or shortness of breath.  03/25/15  Yes [provider]  DULoxetine (CYMBALTA) 30 MG capsule Take 1 capsule (30 mg total) by mouth 2 (two) times daily. 09/19/17  Yes Cammie Sickle, MD  gabapentin (NEURONTIN) 300 MG capsule TAKE 1 CAPSULE BY MOUTH 3  TIMES DAILY 11/12/17  Yes Cammie Sickle, MD  metoprolol succinate (TOPROL-XL) 100 MG 24 hr tablet Take 100 mg by mouth daily. Take with or immediately following a meal.   Yes [provider]  Multiple Vitamin (MULTI-VITAMINS) TABS Take 1 tablet by mouth daily.    Yes [provider]  OXYGEN Inhale 2 L into the lungs at bedtime and may repeat dose one time if needed.    Yes [provider]      PHYSICAL EXAMINATION:   VITAL SIGNS: Blood pressure 123/84, pulse (!) 125, temperature (!) 97.5 F (36.4 C), temperature source Oral, resp. rate (!) 23, height 5\' 7"  (1.702 m), weight 74.8 kg (165 lb), SpO2 96 %.  GENERAL:  62 y.o.-year-old patient lying in the bed with no acute distress.  EYES: Pupils equal, round, reactive to light and accommodation. No scleral icterus. Extraocular muscles intact.  HEENT: Head atraumatic, normocephalic. Oropharynx and nasopharynx clear.  NECK:  Supple, no jugular venous distention. No thyroid enlargement, no tenderness.  LUNGS: Decreased breath sounds bilaterally, bilateral wheezing. No use of accessory muscles of respiration.  CARDIOVASCULAR: S1, S2 normal. No murmurs, rubs, or gallops.  ABDOMEN: Soft, nontender, nondistended. Bowel sounds present. No organomegaly or mass.  EXTREMITIES: No pedal edema, cyanosis, or clubbing.   NEUROLOGIC: Cranial nerves II through XII are intact. Muscle strength 5/5 in all extremities. Sensation intact. Gait not checked.  PSYCHIATRIC: The patient is alert and oriented x 3.  SKIN: No obvious rash, lesion, or ulcer.   LABORATORY PANEL:   CBC Recent Labs  Lab 01/14/18 1411  WBC 18.0*  HGB 14.3  HCT 41.7  PLT 235  MCV 103.3*  MCH 35.5*  MCHC 34.4  RDW 13.0  LYMPHSABS 0.6*  MONOABS 0.6  EOSABS 0.1  BASOSABS 0.0   ------------------------------------------------------------------------------------------------------------------  Chemistries  Recent Labs  Lab 01/14/18 1411  NA 130*  K 3.5  CL 93*  CO2 28  GLUCOSE 212*  BUN 11  CREATININE 1.07*  CALCIUM 9.4  AST 42*  ALT 26  ALKPHOS 122  BILITOT 1.0   ------------------------------------------------------------------------------------------------------------------ estimated creatinine clearance is 58.3 mL/min (A) (by C-G formula based on SCr of 1.07 mg/dL (H)). ------------------------------------------------------------------------------------------------------------------  No results for input(s): TSH, T4TOTAL, T3FREE, THYROIDAB in the last 72 hours.  Invalid input(s): FREET3   Coagulation profile No results for input(s): INR, PROTIME in the last 168 hours. ------------------------------------------------------------------------------------------------------------------- No results for input(s): DDIMER in the last 72 hours. -------------------------------------------------------------------------------------------------------------------  Cardiac Enzymes Recent Labs  Lab 01/14/18 1411  TROPONINI <0.03   ------------------------------------------------------------------------------------------------------------------ Invalid input(s): POCBNP  ---------------------------------------------------------------------------------------------------------------  Urinalysis    Component Value Date/Time    COLORURINE Yellow 03/08/2013 1341   APPEARANCEUR Hazy 03/08/2013 1341   LABSPEC 1.019 03/08/2013 1341   PHURINE 7.0 03/08/2013 1341   GLUCOSEU Negative 03/08/2013 1341   HGBUR Negative 03/08/2013 1341   BILIRUBINUR Negative 03/08/2013 1341   KETONESUR Trace 03/08/2013 1341   PROTEINUR >=500 03/08/2013 1341   NITRITE Negative 03/08/2013 1341   LEUKOCYTESUR Negative 03/08/2013 1341     RADIOLOGY: Dg Chest 2 View  Result Date: 01/14/2018 CLINICAL DATA:  Chest pain, shortness of breath, diarrhea, productive cough. History of breast malignancy, COPD, former smoker. EXAM: CHEST - 2 VIEW COMPARISON:  CT scan of the chest of September 17, 2017 FINDINGS: The lungs are well-expanded. The interstitial markings are increased bilaterally. There is focal increased density just above the left lateral costophrenic angle. The heart and pulmonary vascularity are normal. The mediastinum is normal in width. There is calcification in the wall of the aortic arch. There is moderate dextrocurvature of the thoracic spine centered at approximately T10. IMPRESSION: Diffusely increased lung markings bilaterally are worrisome for pneumonia. Noncardiac pulmonary edema could present in similar fashion but there is minimal pulmonary vascular congestion. Subsegmental atelectasis at the left lung base posteriorly is suspected. Thoracic aortic atherosclerosis. Electronically Signed   By: David  Martinique M.D.   On: 01/14/2018 16:03    EKG: Orders placed or performed during the hospital encounter of 01/14/18  . ED EKG  . ED EKG    IMPRESSION AND PLAN:  62 year old female patient with history of COPD on home oxygen, breast cancer, anxiety disorder, hepatitis C presented to the emergency room with increased shortness of breath, cough  -Acute COPD exacerbation Start patient on IV Solu-Medrol and aggressive breathing treatments Oxygen via nasal cannula  -Community-acquired pneumonia IV Levaquin antibiotic  -Anxiety  disorder Anxiolytics  -DVT prophylaxis with subcu Lovenox daily  -Hyponatremia Monitor sodium levels  -Tobacco abuse Tobacco cessation counseled to the patient for 6 minutes Nicotine patch offered  All the records are reviewed and case discussed with ED provider. Management plans discussed with the patient, family and they are in agreement.  CODE STATUS:Full code    Code Status Orders  (From admission, onward)        Start     Ordered   01/14/18 1613  Full code  Continuous     01/14/18 1612    Code Status History    This patient has a current code status but no historical code status.       TOTAL TIME TAKING CARE OF THIS PATIENT: 52 minutes.    Saundra Shelling M.D on 01/14/2018 at 4:44 PM  Between 7am to 6pm - Pager - [phone]  After 6pm go to [email or web address] - password [email or web address] [facility]  Office  [phone]  CC: Primary care physician; Juluis Pitch, MD

## 2018-01-14 NOTE — Consult Note (Signed)
Pharmacy Antibiotic Note  Regina Bernard is a 62 y.o. female admitted on 01/14/2018 with COPD Exacerbation.  Pharmacy has been consulted for levofloxacin dosing. Patient received azithromycin 500mg  and ceftriaxone 1g IV x 1 dose in ED.   Plan: Start levofloxacin 500mg  PO every 24 hours. Will start 24 hours after azithromycin dose due to risk of QTc prolongation.   Height: 5\' 7"  (170.2 cm) Weight: 164 lb 14.4 oz (74.8 kg) IBW/kg (Calculated) : 61.6  Temp (24hrs), Avg:98 F (36.7 C), Min:97.5 F (36.4 C), Max:98.4 F (36.9 C)  Recent Labs  Lab 01/14/18 1411  WBC 18.0*  CREATININE 1.07*  LATICACIDVEN 1.7    Estimated Creatinine Clearance: 58.3 mL/min (A) (by C-G formula based on SCr of 1.07 mg/dL (H)).    Allergies  Allergen Reactions  . Penicillins Swelling and Rash    Has patient had a PCN reaction causing immediate rash, facial/tongue/throat swelling, SOB or lightheadedness with hypotension: Yes Has patient had a PCN reaction causing severe rash involving mucus membranes or skin necrosis: No Has patient had a PCN reaction that required hospitalization: No Has patient had a PCN reaction occurring within the last 10 years: No If all of the above answers are "NO", then may proceed with Cephalosporin use.     Antimicrobials this admission: 7/10 ceftriaxone/Azithromycin  >>  X 1 dose 7/11 levofloxacin >>  Microbiology results: 7/10 BCx: pending  Thank you for allowing pharmacy to be a part of this patient's care.  Pernell Dupre, PharmD, BCPS Clinical Pharmacist 01/14/2018 6:29 PM

## 2018-01-15 LAB — CBC
HCT: 38.1 % (ref 35.0–47.0)
Hemoglobin: 13.3 g/dL (ref 12.0–16.0)
MCH: 36.6 pg — ABNORMAL HIGH (ref 26.0–34.0)
MCHC: 35 g/dL (ref 32.0–36.0)
MCV: 104.6 fL — ABNORMAL HIGH (ref 80.0–100.0)
PLATELETS: 208 10*3/uL (ref 150–440)
RBC: 3.64 MIL/uL — ABNORMAL LOW (ref 3.80–5.20)
RDW: 13.1 % (ref 11.5–14.5)
WBC: 10.5 10*3/uL (ref 3.6–11.0)

## 2018-01-15 LAB — BASIC METABOLIC PANEL
Anion gap: 13 (ref 5–15)
BUN: 15 mg/dL (ref 8–23)
CALCIUM: 9.6 mg/dL (ref 8.9–10.3)
CO2: 30 mmol/L (ref 22–32)
CREATININE: 0.94 mg/dL (ref 0.44–1.00)
Chloride: 93 mmol/L — ABNORMAL LOW (ref 98–111)
GFR calc Af Amer: >60 mL/min (ref >60)
GLUCOSE: 308 mg/dL — AB (ref 70–99)
Potassium: 3.1 mmol/L — ABNORMAL LOW (ref 3.5–5.1)
Sodium: 136 mmol/L (ref 135–145)

## 2018-01-15 LAB — LACTIC ACID, PLASMA
LACTIC ACID, VENOUS: 5.3 mmol/L — AB (ref 0.5–1.9)
Lactic Acid, Venous: 2.5 mmol/L (ref 0.5–1.9)

## 2018-01-15 LAB — GLUCOSE, CAPILLARY
GLUCOSE-CAPILLARY: 232 mg/dL — AB (ref 70–99)
Glucose-Capillary: 233 mg/dL — ABNORMAL HIGH (ref 70–99)
Glucose-Capillary: 407 mg/dL — ABNORMAL HIGH (ref 70–99)
Glucose-Capillary: 151 mg/dL — ABNORMAL HIGH (ref 70–99)

## 2018-01-15 LAB — BLOOD GAS, ARTERIAL
Acid-Base Excess: 0.8 mmol/L (ref 0.0–2.0)
BICARBONATE: 26.6 mmol/L (ref 20.0–28.0)
FIO2: 0.28
O2 SAT: 92.4 %
PATIENT TEMPERATURE: 37
PO2 ART: 67 mmHg — AB (ref 83.0–108.0)
pCO2 arterial: 46 mmHg (ref 32.0–48.0)
pH, Arterial: 7.37 (ref 7.350–7.450)

## 2018-01-15 LAB — HEMOGLOBIN A1C
Hgb A1c MFr Bld: 5.4 % (ref 4.8–5.6)
Mean Plasma Glucose: 108.28 mg/dL

## 2018-01-15 LAB — PROCALCITONIN: PROCALCITONIN: 0.31 ng/mL

## 2018-01-15 MED ORDER — GUAIFENESIN-DM 100-10 MG/5ML PO SYRP
15.0000 mL | ORAL_SOLUTION | ORAL | Status: DC | PRN
  Administered 2018-01-16: 15 mL via ORAL
  Filled 2018-01-15 (×4): qty 15

## 2018-01-15 MED ORDER — GUAIFENESIN ER 600 MG PO TB12
600.0000 mg | ORAL_TABLET | Freq: Two times a day (BID) | ORAL | Status: DC
  Administered 2018-01-15 – 2018-01-17 (×5): 600 mg via ORAL
  Filled 2018-01-15 (×5): qty 1

## 2018-01-15 MED ORDER — POTASSIUM CHLORIDE CRYS ER 20 MEQ PO TBCR
40.0000 meq | EXTENDED_RELEASE_TABLET | ORAL | Status: AC
  Administered 2018-01-15 (×2): 40 meq via ORAL
  Filled 2018-01-15 (×2): qty 2

## 2018-01-15 MED ORDER — INSULIN GLARGINE 100 UNIT/ML ~~LOC~~ SOLN
10.0000 [IU] | Freq: Every day | SUBCUTANEOUS | Status: DC
  Administered 2018-01-15 – 2018-01-16 (×2): 10 [IU] via SUBCUTANEOUS
  Filled 2018-01-15 (×2): qty 0.1

## 2018-01-15 MED ORDER — PREMIER PROTEIN SHAKE
11.0000 [oz_av] | Freq: Two times a day (BID) | ORAL | Status: DC
  Administered 2018-01-15 – 2018-01-17 (×3): 11 [oz_av] via ORAL

## 2018-01-15 MED ORDER — BUDESONIDE 0.25 MG/2ML IN SUSP
0.2500 mg | Freq: Two times a day (BID) | RESPIRATORY_TRACT | Status: DC
  Administered 2018-01-15 – 2018-01-17 (×4): 0.25 mg via RESPIRATORY_TRACT
  Filled 2018-01-15 (×4): qty 2

## 2018-01-15 MED ORDER — ADULT MULTIVITAMIN W/MINERALS CH
1.0000 | ORAL_TABLET | Freq: Every day | ORAL | Status: DC
  Administered 2018-01-15 – 2018-01-17 (×3): 1 via ORAL
  Filled 2018-01-15 (×3): qty 1

## 2018-01-15 NOTE — Progress Notes (Signed)
[place] at [facility]                                                                                                                                                                                  Patient Demographics   Regina Bernard, is a 62 y.o. female, DOB - [birth year], VPX:[id number]  Admit date - 01/14/2018   Admitting Physician Saundra Shelling, MD  Outpatient Primary MD for the patient is Juluis Pitch, MD   LOS - 1  Subjective: Patient feeling better shortness of breath improved Still coughing   Also has wheezing  Review of Systems:   CONSTITUTIONAL: No documented fever. No fatigue, weakness. No weight gain, no weight loss.  EYES: No blurry or double vision.  ENT: No tinnitus. No postnasal drip. No redness of the oropharynx.  RESPIRATORY: Positive cough, no wheeze, no hemoptysis.  Positive dyspnea.  CARDIOVASCULAR: No chest pain. No orthopnea. No palpitations. No syncope.  GASTROINTESTINAL: No nausea, no vomiting or diarrhea. No abdominal pain. No melena or hematochezia.  GENITOURINARY: No dysuria or hematuria.  ENDOCRINE: No polyuria or nocturia. No heat or cold intolerance.  HEMATOLOGY: No anemia. No bruising. No bleeding.  INTEGUMENTARY: No rashes. No lesions.  MUSCULOSKELETAL: No arthritis. No swelling. No gout.  NEUROLOGIC: No numbness, tingling, or ataxia. No seizure-type activity.  PSYCHIATRIC: No anxiety. No insomnia. No ADD.    Vitals:   Vitals:   01/15/18 0121 01/15/18 0527 01/15/18 0707 01/15/18 1331  BP:  130/88    Pulse:  93    Resp:  18    Temp:      TempSrc:      SpO2: 100% 100% 97% 98%  Weight:      Height:        Wt Readings from Last 3 Encounters:  01/14/18 74.8 kg (164 lb 14.4 oz)  04/18/16 83.5 kg (184 lb)  12/06/15 91.7 kg (202 lb 2.6 oz)     Intake/Output Summary (Last 24 hours) at 01/15/2018 1336 Last data filed at 01/15/2018 0900 Gross per 24 hour  Intake 740 ml  Output -  Net 740 ml     Physical Exam:   GENERAL: Pleasant-appearing in no apparent distress.  HEAD, EYES, EARS, NOSE AND THROAT: Atraumatic, normocephalic. Extraocular muscles are intact. Pupils equal and reactive to light. Sclerae anicteric. No conjunctival injection. No oro-pharyngeal erythema.  NECK: Supple. There is no jugular venous distention. No bruits, no lymphadenopathy, no thyromegaly.  HEART: Regular rate and rhythm,. No murmurs, no rubs, no clicks.  LUNGS: Bilateral wheezing throughout both lungs no accessory muscle usage ABDOMEN: Soft, flat, nontender, nondistended. Has good bowel sounds. No hepatosplenomegaly appreciated.  EXTREMITIES: No evidence  of any cyanosis, clubbing, or peripheral edema.  +2 pedal and radial pulses bilaterally.  NEUROLOGIC: The patient is alert, awake, and oriented x3 with no focal motor or sensory deficits appreciated bilaterally.  SKIN: Moist and warm with no rashes appreciated.  Psych: Not anxious, depressed LN: No inguinal LN enlargement    Antibiotics   Anti-infectives (From admission, onward)   Start     Dose/Rate Route Frequency Ordered Stop   01/15/18 1400  levofloxacin (LEVAQUIN) tablet 500 mg     500 mg Oral Daily 01/14/18 1827     01/14/18 1515  cefTRIAXone (ROCEPHIN) 1 g in sodium chloride 0.9 % 100 mL IVPB     1 g 200 mL/hr over 30 Minutes Intravenous  Once 01/14/18 1505 01/14/18 1543   01/14/18 1515  azithromycin (ZITHROMAX) 500 mg in sodium chloride 0.9 % 250 mL IVPB     500 mg 250 mL/hr over 60 Minutes Intravenous  Once 01/14/18 1505 01/14/18 1625      Medications   Scheduled Meds: . DULoxetine  30 mg Oral BID  . enoxaparin (LOVENOX) injection  40 mg Subcutaneous Q24H  . gabapentin  300 mg Oral TID  . insulin aspart  0-15 Units Subcutaneous TID WC  . ipratropium-albuterol  3 mL Nebulization Q6H  . levofloxacin  500 mg Oral Daily  . methylPREDNISolone (SOLU-MEDROL) injection  60 mg Intravenous Q6H  . metoprolol succinate  100 mg Oral Daily   . nicotine  21 mg Transdermal Daily  . sodium chloride flush  3 mL Intravenous Q12H   Continuous Infusions: . sodium chloride     PRN Meds:.sodium chloride, acetaminophen **OR** acetaminophen, ALPRAZolam, guaiFENesin-dextromethorphan, ondansetron **OR** ondansetron (ZOFRAN) IV, senna-docusate, sodium chloride flush   Data Review:   Micro Results Recent Results (from the past 240 hour(s))  Blood culture (routine x 2)     Status: None (Preliminary result)   Collection Time: 01/14/18  2:41 PM  Result Value Ref Range Status   Specimen Description BLOOD RIGHT FATTY CASTS  Final   Special Requests   Final    BOTTLES DRAWN AEROBIC AND ANAEROBIC Blood Culture results may not be optimal due to an excessive volume of blood received in culture bottles   Culture   Final    NO GROWTH < 24 HOURS Performed at [facility], [address] [place], [place] [address]    Report Status PENDING  Incomplete  Blood culture (routine x 2)     Status: None (Preliminary result)   Collection Time: 01/14/18  2:42 PM  Result Value Ref Range Status   Specimen Description BLOOD RIGHT FATTY CASTS  Final   Special Requests   Final    BOTTLES DRAWN AEROBIC AND ANAEROBIC Blood Culture results may not be optimal due to an excessive volume of blood received in culture bottles   Culture   Final    NO GROWTH < 24 HOURS Performed at [facility], [address] [place], [place] [address]    Report Status PENDING  Incomplete    Radiology Reports Dg Chest 2 View  Result Date: 01/14/2018 CLINICAL DATA:  Chest pain, shortness of breath, diarrhea, productive cough. History of breast malignancy, COPD, former smoker. EXAM: CHEST - 2 VIEW COMPARISON:  CT scan of the chest of September 17, 2017 FINDINGS: The lungs are well-expanded. The interstitial markings are increased bilaterally. There is focal increased density just above the left lateral costophrenic angle. The heart and pulmonary vascularity are  normal. The mediastinum is normal  in width. There is calcification in the wall of the aortic arch. There is moderate dextrocurvature of the thoracic spine centered at approximately T10. IMPRESSION: Diffusely increased lung markings bilaterally are worrisome for pneumonia. Noncardiac pulmonary edema could present in similar fashion but there is minimal pulmonary vascular congestion. Subsegmental atelectasis at the left lung base posteriorly is suspected. Thoracic aortic atherosclerosis. Electronically Signed   By: David  Martinique M.D.   On: 01/14/2018 16:03     CBC Recent Labs  Lab 01/14/18 1411 01/15/18 0359  WBC 18.0* 10.5  HGB 14.3 13.3  HCT 41.7 38.1  PLT 235 208  MCV 103.3* 104.6*  MCH 35.5* 36.6*  MCHC 34.4 35.0  RDW 13.0 13.1  LYMPHSABS 0.6*  --   MONOABS 0.6  --   EOSABS 0.1  --   BASOSABS 0.0  --     Chemistries  Recent Labs  Lab 01/14/18 1411 01/15/18 0359  NA 130* 136  K 3.5 3.1*  CL 93* 93*  CO2 28 30  GLUCOSE 212* 308*  BUN 11 15  CREATININE 1.07* 0.94  CALCIUM 9.4 9.6  AST 42*  --   ALT 26  --   ALKPHOS 122  --   BILITOT 1.0  --    ------------------------------------------------------------------------------------------------------------------ estimated creatinine clearance is 66.4 mL/min (by C-G formula based on SCr of 0.94 mg/dL). ------------------------------------------------------------------------------------------------------------------ No results for input(s): HGBA1C in the last 72 hours. ------------------------------------------------------------------------------------------------------------------ No results for input(s): CHOL, HDL, LDLCALC, TRIG, CHOLHDL, LDLDIRECT in the last 72 hours. ------------------------------------------------------------------------------------------------------------------ No results for input(s): TSH, T4TOTAL, T3FREE, THYROIDAB in the last 72 hours.  Invalid input(s):  FREET3 ------------------------------------------------------------------------------------------------------------------ No results for input(s): VITAMINB12, FOLATE, FERRITIN, TIBC, IRON, RETICCTPCT in the last 72 hours.  Coagulation profile No results for input(s): INR, PROTIME in the last 168 hours.  No results for input(s): DDIMER in the last 72 hours.  Cardiac Enzymes Recent Labs  Lab 01/14/18 1411  TROPONINI <0.03   ------------------------------------------------------------------------------------------------------------------ Invalid input(s): POCBNP    Assessment & Plan   62 year old female patient with history of COPD on home oxygen, breast cancer, anxiety disorder, hepatitis C presented to the emergency room with increased shortness of breath, cough  -Acute on chronic COPD exacerbation Continue IV Solu-Medrol and aggressive breathing treatments Oxygen via nasal cannula  -Community-acquired pneumonia Continue IV Levaquin antibiotic  -Hyperglycemia due to steroid Check hemglboin ac1c Add ssi  -Hypokalemia replace potassium   -Anxiety disorder Anxiolytics  -Hyponatremia Monitor sodium levels  -Tobacco abuse Tobacco cessation provided       Code Status Orders  (From admission, onward)        Start     Ordered   01/14/18 1613  Full code  Continuous     01/14/18 1612    Code Status History    This patient has a current code status but no historical code status.           Consults  none  DVT Prophylaxis  Lovenox   Lab Results  Component Value Date   PLT 208 01/15/2018     Time Spent in minutes   2min  Greater than 50% of time spent in care coordination and counseling patient regarding the condition and plan of care.   Dustin Flock M.D on 01/15/2018 at 1:36 PM  Between 7am to 6pm - Pager - [phone]  After 6pm go to [email or web address] - Proofreader  [employer]   Office  [phone]

## 2018-01-15 NOTE — ED Provider Notes (Signed)
[facility] Emergency Department Provider Note   ____________________________________________   I have reviewed the triage vital signs and the nursing notes.   HISTORY  Chief Complaint Chest Pain and Shortness of Breath   History limited by: Not Limited   HPI Regina Bernard is a 62 y.o. female who presents to the emergency department today because of concerns for shortness of breath.  Patient states it is been going on for the past couple of days.  She does have accompanying chest pain.  Is located primarily in the center part of her chest.  She has had some cough.  It has been productive of thick green mucus.  States she has a history of COPD.  She also feels like she has had fevers intermittently over the past few days.   Per medical record review patient has a history of COPD  Past Medical History:  Diagnosis Date  . Abnormal CT scan, chest   . Anxiety   . Asthma   . Breast cancer ([place])    Breast CA- Right   . Breast cancer ([place]) 06/18/2011   right breast cancer  . COPD (chronic obstructive pulmonary disease) ([place])   . Endometriosis    tx with vaginal hysterectomy  . Fatty liver   . Hepatitis C 1998   [facility] trial treatment in-patient study 2005. HCV 740, 05/20/11.  [name] History of substance abuse    Heroine, cocaine, marijuana. States all of them. Heavy use 1995 to 2000. Occasional use before and after. None since 2006.   [name] Hot flashes related to aromatase inhibitor therapy   . Hypertension   . Lung mass   . Peripheral neuropathy    in hands and feet    Patient Active Problem List   Diagnosis Date Noted  . COPD exacerbation ([place]) 01/14/2018  . Carcinoma of overlapping sites of right breast in female, estrogen receptor positive ([place]) 04/18/2016  . Severe chronic obstructive pulmonary disease ([place]) 11/16/2013    Past Surgical History:  Procedure Laterality Date  . BREAST LUMPECTOMY Right 2013  . TUBAL LIGATION  1996  . Upper right leg  benign tumor removed    . VAGINAL HYSTERECTOMY  2001    Prior to Admission medications   Medication Sig Start Date End Date Taking? Authorizing Provider  albuterol (PROVENTIL) (2.5 MG/3ML) 0.083% nebulizer solution Take 2.5 mg by nebulization every 6 (six) hours as needed for wheezing.  09/16/14  Yes [provider]  BREO ELLIPTA 100-25 MCG/INH AEPB Inhale 1 puff into the lungs daily. 12/26/17  Yes [provider]  COMBIVENT RESPIMAT 20-100 MCG/ACT AERS respimat Inhale 1 puff into the lungs every 6 (six) hours as needed for wheezing or shortness of breath.  03/25/15  Yes [provider]  DULoxetine (CYMBALTA) 30 MG capsule Take 1 capsule (30 mg total) by mouth 2 (two) times daily. 09/19/17  Yes Cammie Sickle, MD  gabapentin (NEURONTIN) 300 MG capsule TAKE 1 CAPSULE BY MOUTH 3  TIMES DAILY 11/12/17  Yes Cammie Sickle, MD  metoprolol succinate (TOPROL-XL) 100 MG 24 hr tablet Take 100 mg by mouth daily. Take with or immediately following a meal.   Yes [provider]  Multiple Vitamin (MULTI-VITAMINS) TABS Take 1 tablet by mouth daily.    Yes [provider]  OXYGEN Inhale 2 L into the lungs at bedtime and may repeat dose one time if needed.    Yes [provider]    Allergies Penicillins  Family History  Problem Relation Age of Onset  . Diabetes Maternal Grandmother   . Lung cancer Father   . Lung cancer Mother   . Hypertension Mother   . Heart disease Mother   . Skin cancer Mother     Social History Social History   Tobacco Use  . Smoking status: Current Every Day Smoker    Packs/day: 0.25    Years: 44.00    Pack years: 11.00    Types: Cigarettes    Last attempt to quit: 03/21/2016    Years since quitting: 1.8  . Smokeless tobacco: Never Used  . Tobacco comment: last cigeratte use 6 days ago (11/30/15)  Substance Use Topics  . Alcohol use: Yes    Alcohol/week: 0.0 oz  . Drug use: No    Review of  Systems Constitutional: Positive for fever Eyes: No visual changes. ENT: No sore throat. Cardiovascular: Positive for chest pain. Respiratory: Positive shortness of breath. Gastrointestinal: No abdominal pain.  No nausea, no vomiting.  No diarrhea.   Genitourinary: Negative for dysuria. Musculoskeletal: Negative for back pain. Skin: Negative for rash. Neurological: Negative for headaches, focal weakness or numbness.  ____________________________________________   PHYSICAL EXAM:  VITAL SIGNS: ED Triage Vitals  Enc Vitals Group     BP 01/14/18 1406 (!) 109/94     Pulse Rate 01/14/18 1406 (!) 132     Resp 01/14/18 1406 (!) 26     Temp 01/14/18 1406 (!) 97.5 F (36.4 C)     Temp Source 01/14/18 1406 Oral     SpO2 01/14/18 1406 96 %     Weight 01/14/18 1407 165 lb (74.8 kg)     Height 01/14/18 1407 5\' 7"  (1.702 m)     Head Circumference --      Peak Flow --      Pain Score 01/14/18 1407 6   Constitutional: Alert and oriented. Moderate respiratory distress. Eyes: Conjunctivae are normal.  ENT      Head: Normocephalic and atraumatic.      Nose: No congestion/rhinnorhea.      Mouth/Throat: Mucous membranes are moist.      Neck: No stridor. Hematological/Lymphatic/Immunilogical: No cervical lymphadenopathy. Cardiovascular: Tachycardia, regular rhythm.  No murmurs, rubs, or gallops.  Respiratory: Increased respiratory effort and rate. Diffuse expiratory wheezing. Gastrointestinal: Soft and non tender. No rebound. No guarding.  Genitourinary: Deferred Musculoskeletal: Normal range of motion in all extremities. No lower extremity edema. Neurologic:  Normal speech and language. No gross focal neurologic deficits are appreciated.  Skin:  Skin is warm, dry and intact. No rash noted. Psychiatric: Mood and affect are normal. Speech and behavior are normal. Patient exhibits appropriate insight and judgment.  ____________________________________________    LABS (pertinent  positives/negatives)  CBC wbc 18.0, hgb 14.3, plt 235 Trop <0.03 CMP na 130, k 3.5, glu 212, cr 1.07 UA not consistent with infection  ____________________________________________   EKG  I, Nance Pear, attending physician, personally viewed and interpreted this EKG  EKG Time: 1406 Rate: 138 Rhythm: sinus tachycardia Axis: normal Intervals: qtc 415 QRS: narrow ST changes: no st elevation Impression: abnormal ekg  ____________________________________________    RADIOLOGY  CXR pending at time of sign out  ____________________________________________   PROCEDURES  Procedures  ____________________________________________   INITIAL IMPRESSION / ASSESSMENT AND PLAN / ED COURSE  Pertinent labs & imaging results that were available during my care of the patient were reviewed by me and considered in my medical decision making (see chart for details).   Patient presented to  the emergency department today because of concerns for shortness of breath, chest pain and intermittent fevers of the past few days.  On exam patient is in moderate respiratory distress.  This did improve after some duo nebs.  Certainly part of this is likely COPD exacerbation.  However given history of fevers and leukocytosis on blood work to have concerns for pneumonia.  Will go ahead and start IV antibiotics.  Will plan on admission. _____________________________________   FINAL CLINICAL IMPRESSION(S) / ED DIAGNOSES  Final diagnoses:  Shortness of breath     Note: This dictation was prepared with Dragon dictation. Any transcriptional errors that result from this process are unintentional     Nance Pear, MD 01/15/18 1635

## 2018-01-15 NOTE — Progress Notes (Signed)
Spoke with Dr Jannifer Franklin concerning critical lactic acid of 3.7. He ordered a Stat ABG ordered. IVF at 100 and NS bolus ordered as well . Also, discussion on whether pt may need to transfer to higher level of care for continuous bipap. Dr Jannifer Franklin said that lab results will determine if transfer will be needed. This writer told Dr Jannifer Franklin that patient had received 40 mg of IV Lasix around 1700 yesterday. Lactic acid was 2.8 then but no fluids were given at that time. CXR showed pulmonary congestion/edema.  A repeat Lactic was put in for 2230 by Dr Estanislado Pandy. Dr Jannifer Franklin then put order in to discontinue NS bolus. Awaiting lab results.

## 2018-01-15 NOTE — Progress Notes (Signed)
Paged Dr Marcille Blanco in regards to critical Lactic acid level of 5.3. He came up to the unit to see another patient and during that time he reviewed Mrs Donahoe's chart. Dr Marcille Blanco then placed an order to change rate of NS from 100 ml/hr to 150 ml/hr. Pt next Lactic acid blood draw is at 0700. Will continue to monitor patient response to treatment.

## 2018-01-15 NOTE — Progress Notes (Signed)
Lab called with critical lactic acid of 3.7. MD paged for further instructions.

## 2018-01-15 NOTE — Progress Notes (Signed)
Order for bipap at bedtime placed by Dr Jannifer Franklin.Bipap placed by Cardiopulmonary. Repeat lactic acid at 4am. Pt still with rhonchi/expiratory wheezes. Heart rate down to the 90's now. Pt still diaphoretic but afebrile. Dyspneic on exertion. Cough improved. Anxiety and restlessness also have improved. Will continue to moniter.

## 2018-01-15 NOTE — Progress Notes (Addendum)
Initial Nutrition Assessment  DOCUMENTATION CODES:   Not applicable  INTERVENTION:   Premier Protein BID, each supplement provides 160 kcal and 30 grams of protein.   MVI daily  Liberalize diet   NUTRITION DIAGNOSIS:   Increased nutrient needs related to chronic illness(COPD, Hep C, breast ca) as evidenced by increased estimated needs.  GOAL:   Patient will meet greater than or equal to 90% of their needs  MONITOR:   PO intake, Supplement acceptance, Labs, Weight trends, Skin, I & O's  REASON FOR ASSESSMENT:   Malnutrition Screening Tool    ASSESSMENT:   62 y.o. female with a known history of COPD on home oxygen, breast cancer, anxiety disorder, hepatitis C presented to the emergency room for shortness of breath and wheezing   Met with pt in room today. Pt reports good appetite today and pta. Pt currently eating 100% of meals. Per chart, pt weighed 171lbs in 08/2017 and 173lbs in 04/2017. Pt reports her UBW is ~170lbs; it appears pt has had a 3lb(2%) wt loss which is not significant. Pt likes chocolate Ensure; RD will order chocolate Premier Protein as pt with hypeglycemia. RD will also liberalize pt's diet as a heart healthy diet restricts protein.   Medications reviewed and include: lovenox, insulin, solu-medrol, nicotine  Labs reviewed: K 3.1(L), Cl 93(L) cbgs- 233, 407 x 24 hrs  NUTRITION - FOCUSED PHYSICAL EXAM:    Most Recent Value  Orbital Region  No depletion  Upper Arm Region  Mild depletion  Thoracic and Lumbar Region  Mild depletion  Buccal Region  Mild depletion  Temple Region  Mild depletion  Clavicle Bone Region  No depletion  Clavicle and Acromion Bone Region  No depletion  Scapular Bone Region  No depletion  Dorsal Hand  Mild depletion  Patellar Region  Mild depletion  Anterior Thigh Region  No depletion  Posterior Calf Region  Mild depletion  Edema (RD Assessment)  None  Hair  Reviewed  Eyes  Reviewed  Mouth  Reviewed  Skin  Reviewed   Nails  Reviewed     Diet Order:   Diet Order           Diet heart healthy/carb modified Room service appropriate? Yes; Fluid consistency: Thin  Diet effective now         EDUCATION NEEDS:   Education needs have been addressed  Skin:  Skin Assessment: Reviewed RN Assessment(abrasions )  Last BM:  7/10  Height:   Ht Readings from Last 1 Encounters:  01/14/18 '5\' 7"'$  (1.702 m)    Weight:   Wt Readings from Last 1 Encounters:  01/15/18 166 lb 14.4 oz (75.7 kg)    Ideal Body Weight:  61.36 kg  BMI:  Body mass index is 26.14 kg/m.  Estimated Nutritional Needs:   Kcal:  1700-2000kcal/day   Protein:  84-99g/day   Fluid:  >1.5L/day   Koleen Distance MS, RD, LDN Pager #- [phone] Office#- [phone] After Hours Pager: [phone]

## 2018-01-15 NOTE — Progress Notes (Signed)
Inpatient Diabetes Program Recommendations  AACE/ADA: New Consensus Statement on Inpatient Glycemic Control (2015)  Target Ranges:  Prepandial:   less than 140 mg/dL      Peak postprandial:   less than 180 mg/dL (1-2 hours)      Critically ill patients:  140 - 180 mg/dL   Lab Results  Component Value Date   GLUCAP 233 (H) 01/15/2018    Review of Glycemic ControlResults for Regina Bernard, Regina Bernard (MRN [id number]) as of 01/15/2018 09:45  Ref. Range 01/14/2018 17:03 01/14/2018 21:43 01/14/2018 21:46 01/15/2018 07:57  Glucose-Capillary Latest Ref Range: 70 - 99 mg/dL 171 (H) 366 (H) 352 (H) 233 (H)    Diabetes history: Type 2 DM  Outpatient Diabetes medications: None Current orders for Inpatient glycemic control:  Novolog moderate tid with meals, Solumedrol 60 mg IV q 6 hours  Inpatient Diabetes Program Recommendations:    Blood sugars> hospital goal.  While on steroids, may consider adding Levemir 8 units bid.  Also may consider increasing correction to resistant while on steroids. As steroids are titrated, insulin will also need to be reduced.   Thanks,  Adah Perl, RN, BC-ADM Inpatient Diabetes Coordinator Pager [phone]

## 2018-01-15 NOTE — Plan of Care (Signed)
  Problem: Education: Goal: Knowledge of General Education information will improve Outcome: Progressing   Problem: Education: Goal: Knowledge of disease or condition will improve Outcome: Progressing Goal: Knowledge of the prescribed therapeutic regimen will improve Outcome: Progressing   Problem: Clinical Measurements: Goal: Ability to maintain a body temperature in the normal range will improve Outcome: Progressing   Problem: Respiratory: Goal: Ability to maintain adequate ventilation will improve Outcome: Progressing Goal: Ability to maintain a clear airway will improve Outcome: Progressing

## 2018-01-15 NOTE — Progress Notes (Signed)
Bipap initiated. Pt. Tolerating well.

## 2018-01-15 NOTE — Progress Notes (Signed)
Dr Ulysees Barns made aware of pts BS 407, new order to give the 15units, add carb mod diet, hgb A1C to be ordered

## 2018-01-16 LAB — GLUCOSE, CAPILLARY
GLUCOSE-CAPILLARY: 195 mg/dL — AB (ref 70–99)
Glucose-Capillary: 246 mg/dL — ABNORMAL HIGH (ref 70–99)
Glucose-Capillary: 245 mg/dL — ABNORMAL HIGH (ref 70–99)
Glucose-Capillary: 181 mg/dL — ABNORMAL HIGH (ref 70–99)

## 2018-01-16 LAB — BASIC METABOLIC PANEL
Anion gap: 6 (ref 5–15)
Anion gap: 6 (ref 5–15)
BUN: 26 mg/dL — ABNORMAL HIGH (ref 8–23)
BUN: 28 mg/dL — AB (ref 8–23)
CALCIUM: 9.2 mg/dL (ref 8.9–10.3)
CALCIUM: 9.3 mg/dL (ref 8.9–10.3)
CO2: 29 mmol/L (ref 22–32)
CO2: 31 mmol/L (ref 22–32)
CREATININE: 0.92 mg/dL (ref 0.44–1.00)
Chloride: 97 mmol/L — ABNORMAL LOW (ref 98–111)
Chloride: 94 mmol/L — ABNORMAL LOW (ref 98–111)
Creatinine, Ser: 0.85 mg/dL (ref 0.44–1.00)
GFR calc Af Amer: >60 mL/min (ref >60)
GFR calc non Af Amer: >60 mL/min (ref >60)
GFR calc non Af Amer: >60 mL/min (ref >60)
GLUCOSE: 269 mg/dL — AB (ref 70–99)
Glucose, Bld: 238 mg/dL — ABNORMAL HIGH (ref 70–99)
Potassium: 5.7 mmol/L — ABNORMAL HIGH (ref 3.5–5.1)
Potassium: 5.6 mmol/L — ABNORMAL HIGH (ref 3.5–5.1)
SODIUM: 132 mmol/L — AB (ref 135–145)
Sodium: 131 mmol/L — ABNORMAL LOW (ref 135–145)

## 2018-01-16 LAB — CBC
HCT: 35.8 % (ref 35.0–47.0)
HEMOGLOBIN: 12.3 g/dL (ref 12.0–16.0)
MCH: 36.1 pg — ABNORMAL HIGH (ref 26.0–34.0)
MCHC: 34.4 g/dL (ref 32.0–36.0)
MCV: 104.9 fL — ABNORMAL HIGH (ref 80.0–100.0)
PLATELETS: 239 10*3/uL (ref 150–440)
RBC: 3.41 MIL/uL — ABNORMAL LOW (ref 3.80–5.20)
RDW: 13 % (ref 11.5–14.5)
WBC: 22.9 10*3/uL — ABNORMAL HIGH (ref 3.6–11.0)

## 2018-01-16 LAB — PROCALCITONIN: PROCALCITONIN: 0.48 ng/mL

## 2018-01-16 LAB — HIV ANTIBODY (ROUTINE TESTING W REFLEX): HIV Screen 4th Generation wRfx: NONREACTIVE

## 2018-01-16 MED ORDER — INSULIN GLARGINE 100 UNIT/ML ~~LOC~~ SOLN
6.0000 [IU] | Freq: Once | SUBCUTANEOUS | Status: AC
  Administered 2018-01-16: 6 [IU] via SUBCUTANEOUS
  Filled 2018-01-16: qty 0.06

## 2018-01-16 MED ORDER — INSULIN GLARGINE 100 UNIT/ML ~~LOC~~ SOLN
16.0000 [IU] | Freq: Every day | SUBCUTANEOUS | Status: DC
  Administered 2018-01-17: 09:00:00 16 [IU] via SUBCUTANEOUS
  Filled 2018-01-16: qty 0.16

## 2018-01-16 MED ORDER — INSULIN ASPART 100 UNIT/ML ~~LOC~~ SOLN
0.0000 [IU] | Freq: Every day | SUBCUTANEOUS | Status: DC
Start: 2018-01-16 — End: 2018-01-17

## 2018-01-16 NOTE — Progress Notes (Signed)
[place] [facility]                                                                                                                                                                                  Patient Demographics   Regina Bernard, is a 62 y.o. female, DOB - Aug 03, 1955, AST:[id number]  Admit date - 01/14/2018   Admitting Physician Saundra Shelling, MD  Outpatient Primary MD for the patient is Juluis Pitch, MD   LOS - 2  Subjective: Breathing is improving slowly but still having cough   Review of Systems:   CONSTITUTIONAL: No documented fever. No fatigue, weakness. No weight gain, no weight loss.  EYES: No blurry or double vision.  ENT: No tinnitus. No postnasal drip. No redness of the oropharynx.  RESPIRATORY: Positive cough, positive wheeze, no hemoptysis.  Positive dyspnea.  CARDIOVASCULAR: No chest pain. No orthopnea. No palpitations. No syncope.  GASTROINTESTINAL: No nausea, no vomiting or diarrhea. No abdominal pain. No melena or hematochezia.  GENITOURINARY: No dysuria or hematuria.  ENDOCRINE: No polyuria or nocturia. No heat or cold intolerance.  HEMATOLOGY: No anemia. No bruising. No bleeding.  INTEGUMENTARY: No rashes. No lesions.  MUSCULOSKELETAL: No arthritis. No swelling. No gout.  NEUROLOGIC: No numbness, tingling, or ataxia. No seizure-type activity.  PSYCHIATRIC: No anxiety. No insomnia. No ADD.    Vitals:   Vitals:   01/16/18 0430 01/16/18 0500 01/16/18 0743 01/16/18 0831  BP: (!) 142/76   116/80  Pulse: 72   85  Resp:    (!) 22  Temp: (!) 97.3 F (36.3 C)   (!) 97.4 F (36.3 C)  TempSrc: Oral   Oral  SpO2:  96% 100% 100%  Weight:      Height:        Wt Readings from Last 3 Encounters:  01/15/18 75.7 kg (166 lb 14.4 oz)  04/18/16 83.5 kg (184 lb)  12/06/15 91.7 kg (202 lb 2.6 oz)     Intake/Output Summary (Last 24 hours) at 01/16/2018 1330 Last data filed at 01/16/2018 1038 Gross per 24 hour  Intake 720 ml   Output -  Net 720 ml    Physical Exam:   GENERAL: Pleasant-appearing in no apparent distress.  HEAD, EYES, EARS, NOSE AND THROAT: Atraumatic, normocephalic. Extraocular muscles are intact. Pupils equal and reactive to light. Sclerae anicteric. No conjunctival injection. No oro-pharyngeal erythema.  NECK: Supple. There is no jugular venous distention. No bruits, no lymphadenopathy, no thyromegaly.  HEART: Regular rate and rhythm,. No murmurs, no rubs, no clicks.  LUNGS: Bilateral wheezing throughout both lungs no accessory muscle usage ABDOMEN: Soft, flat, nontender, nondistended. Has good bowel sounds. No hepatosplenomegaly  appreciated.  EXTREMITIES: No evidence of any cyanosis, clubbing, or peripheral edema.  +2 pedal and radial pulses bilaterally.  NEUROLOGIC: The patient is alert, awake, and oriented x3 with no focal motor or sensory deficits appreciated bilaterally.  SKIN: Moist and warm with no rashes appreciated.  Psych: Not anxious, depressed LN: No inguinal LN enlargement    Antibiotics   Anti-infectives (From admission, onward)   Start     Dose/Rate Route Frequency Ordered Stop   01/15/18 1400  levofloxacin (LEVAQUIN) tablet 500 mg     500 mg Oral Daily 01/14/18 1827     01/14/18 1515  cefTRIAXone (ROCEPHIN) 1 g in sodium chloride 0.9 % 100 mL IVPB     1 g 200 mL/hr over 30 Minutes Intravenous  Once 01/14/18 1505 01/14/18 1543   01/14/18 1515  azithromycin (ZITHROMAX) 500 mg in sodium chloride 0.9 % 250 mL IVPB     500 mg 250 mL/hr over 60 Minutes Intravenous  Once 01/14/18 1505 01/14/18 1625      Medications   Scheduled Meds: . budesonide (PULMICORT) nebulizer solution  0.25 mg Nebulization BID  . DULoxetine  30 mg Oral BID  . enoxaparin (LOVENOX) injection  40 mg Subcutaneous Q24H  . gabapentin  300 mg Oral TID  . guaiFENesin  600 mg Oral BID  . insulin aspart  0-15 Units Subcutaneous TID WC  . [START ON 01/17/2018] insulin glargine  16 Units Subcutaneous Daily   . insulin glargine  6 Units Subcutaneous Once  . ipratropium-albuterol  3 mL Nebulization Q6H  . levofloxacin  500 mg Oral Daily  . methylPREDNISolone (SOLU-MEDROL) injection  60 mg Intravenous Q6H  . metoprolol succinate  100 mg Oral Daily  . multivitamin with minerals  1 tablet Oral Daily  . nicotine  21 mg Transdermal Daily  . protein supplement shake  11 oz Oral BID BM  . sodium chloride flush  3 mL Intravenous Q12H   Continuous Infusions: . sodium chloride     PRN Meds:.sodium chloride, acetaminophen **OR** acetaminophen, ALPRAZolam, guaiFENesin-dextromethorphan, ondansetron **OR** ondansetron (ZOFRAN) IV, senna-docusate, sodium chloride flush   Data Review:   Micro Results Recent Results (from the past 240 hour(s))  Blood culture (routine x 2)     Status: None (Preliminary result)   Collection Time: 01/14/18  2:41 PM  Result Value Ref Range Status   Specimen Description BLOOD RIGHT FATTY CASTS  Final   Special Requests   Final    BOTTLES DRAWN AEROBIC AND ANAEROBIC Blood Culture results may not be optimal due to an excessive volume of blood received in culture bottles   Culture   Final    NO GROWTH 2 DAYS Performed at [facility], [address] [place], [place] [address]    Report Status PENDING  Incomplete  Blood culture (routine x 2)     Status: None (Preliminary result)   Collection Time: 01/14/18  2:42 PM  Result Value Ref Range Status   Specimen Description BLOOD RIGHT FATTY CASTS  Final   Special Requests   Final    BOTTLES DRAWN AEROBIC AND ANAEROBIC Blood Culture results may not be optimal due to an excessive volume of blood received in culture bottles   Culture   Final    NO GROWTH 2 DAYS Performed at [facility], [address], [place], [place] [address]    Report Status PENDING  Incomplete    Radiology Reports Dg Chest 2 View  Result Date: 01/14/2018 CLINICAL DATA:  Chest pain,  shortness of breath, diarrhea, productive cough.  History of breast malignancy, COPD, former smoker. EXAM: CHEST - 2 VIEW COMPARISON:  CT scan of the chest of September 17, 2017 FINDINGS: The lungs are well-expanded. The interstitial markings are increased bilaterally. There is focal increased density just above the left lateral costophrenic angle. The heart and pulmonary vascularity are normal. The mediastinum is normal in width. There is calcification in the wall of the aortic arch. There is moderate dextrocurvature of the thoracic spine centered at approximately T10. IMPRESSION: Diffusely increased lung markings bilaterally are worrisome for pneumonia. Noncardiac pulmonary edema could present in similar fashion but there is minimal pulmonary vascular congestion. Subsegmental atelectasis at the left lung base posteriorly is suspected. Thoracic aortic atherosclerosis. Electronically Signed   By: David  Martinique M.D.   On: 01/14/2018 16:03     CBC Recent Labs  Lab 01/14/18 1411 01/15/18 0359 01/16/18 0522  WBC 18.0* 10.5 22.9*  HGB 14.3 13.3 12.3  HCT 41.7 38.1 35.8  PLT 235 208 239  MCV 103.3* 104.6* 104.9*  MCH 35.5* 36.6* 36.1*  MCHC 34.4 35.0 34.4  RDW 13.0 13.1 13.0  LYMPHSABS 0.6*  --   --   MONOABS 0.6  --   --   EOSABS 0.1  --   --   BASOSABS 0.0  --   --     Chemistries  Recent Labs  Lab 01/14/18 1411 01/15/18 0359 01/16/18 0522  NA 130* 136 132*  K 3.5 3.1* 5.7*  CL 93* 93* 97*  CO2 28 30 29   GLUCOSE 212* 308* 238*  BUN 11 15 26*  CREATININE 1.07* 0.94 0.92  CALCIUM 9.4 9.6 9.2  AST 42*  --   --   ALT 26  --   --   ALKPHOS 122  --   --   BILITOT 1.0  --   --    ------------------------------------------------------------------------------------------------------------------ estimated creatinine clearance is 68.1 mL/min (by C-G formula based on SCr of 0.92 mg/dL). ------------------------------------------------------------------------------------------------------------------ Recent Labs    01/15/18 0359  HGBA1C  5.4   ------------------------------------------------------------------------------------------------------------------ No results for input(s): CHOL, HDL, LDLCALC, TRIG, CHOLHDL, LDLDIRECT in the last 72 hours. ------------------------------------------------------------------------------------------------------------------ No results for input(s): TSH, T4TOTAL, T3FREE, THYROIDAB in the last 72 hours.  Invalid input(s): FREET3 ------------------------------------------------------------------------------------------------------------------ No results for input(s): VITAMINB12, FOLATE, FERRITIN, TIBC, IRON, RETICCTPCT in the last 72 hours.  Coagulation profile No results for input(s): INR, PROTIME in the last 168 hours.  No results for input(s): DDIMER in the last 72 hours.  Cardiac Enzymes Recent Labs  Lab 01/14/18 1411  TROPONINI <0.03   ------------------------------------------------------------------------------------------------------------------ Invalid input(s): POCBNP    Assessment & Plan   62 year old female patient with history of COPD on home oxygen, breast cancer, anxiety disorder, hepatitis C presented to the emergency room with increased shortness of breath, cough  -Acute on chronic COPD exacerbation Continue IV Solu-Medrol and  breathing treatments Oxygen via nasal cannula I will add Mucomyst  -Community-acquired pneumonia Continue IV Levaquin antibiotic  -Hyperglycemia due to steroid Hemoglobin A1c shows no evidence of diabetes Continue sliding scale , and Lantus  -Hypokalemia now hyperkalemic with check potassium   -Anxiety disorder Continue anxiolytics  -Hyponatremia Monitor sodium levels  -Tobacco abuse Tobacco cessation provided       Code Status Orders  (From admission, onward)        Start     Ordered   01/14/18 1613  Full code  Continuous     01/14/18 1612    Code Status History  This patient has a current code status  but no historical code status.           Consults  none  DVT Prophylaxis  Lovenox   Lab Results  Component Value Date   PLT 239 01/16/2018     Time Spent in minutes   59min  Greater than 50% of time spent in care coordination and counseling patient regarding the condition and plan of care.   Dustin Flock M.D on 01/16/2018 at 1:30 PM  Between 7am to 6pm - Pager - [phone]  After 6pm go to [email or web address] - Proofreader  [employer]   Office  [phone]

## 2018-01-16 NOTE — Consult Note (Signed)
Pharmacy Antibiotic Note  Regina Bernard is a 62 y.o. female admitted on 01/14/2018 with COPD Exacerbation.  Pharmacy has been consulted for levofloxacin dosing. Her CrCl has been stable since admission and her antibiotic dose does not require adjustments at this time.  Plan: Continue levofloxacin 500mg  PO every 24 hours.   Height: 5\' 7"  (170.2 cm) Weight: 166 lb 14.4 oz (75.7 kg) IBW/kg (Calculated) : 61.6  Temp (24hrs), Avg:97.4 F (36.3 C), Min:97.3 F (36.3 C), Max:97.5 F (36.4 C)  Recent Labs  Lab 01/14/18 1411 01/14/18 1715 01/14/18 2222 01/15/18 0359 01/15/18 0715 01/16/18 0522  WBC 18.0*  --   --  10.5  --  22.9*  CREATININE 1.07*  --   --  0.94  --  0.92  LATICACIDVEN 1.7 2.8* 3.7* 5.3* 2.5*  --     Estimated Creatinine Clearance: 68.1 mL/min (by C-G formula based on SCr of 0.92 mg/dL).    Allergies  Allergen Reactions  . Penicillins Swelling and Rash    Has patient had a PCN reaction causing immediate rash, facial/tongue/throat swelling, SOB or lightheadedness with hypotension: Yes Has patient had a PCN reaction causing severe rash involving mucus membranes or skin necrosis: No Has patient had a PCN reaction that required hospitalization: No Has patient had a PCN reaction occurring within the last 10 years: No If all of the above answers are "NO", then may proceed with Cephalosporin use.     Antimicrobials this admission: 7/10 ceftriaxone/Azithromycin  >>  X 1 dose 7/11 levofloxacin >>  Microbiology results: 7/10 BCx: pending 7/11 sputum cx: pending  Thank you for allowing pharmacy to be a part of this patient's care.  Dallie Piles, PharmD Clinical Pharmacist 01/16/2018 8:32 AM

## 2018-01-16 NOTE — Progress Notes (Signed)
Inpatient Diabetes Program Recommendations  AACE/ADA: New Consensus Statement on Inpatient Glycemic Control (2015)  Target Ranges:  Prepandial:   less than 140 mg/dL      Peak postprandial:   less than 180 mg/dL (1-2 hours)      Critically ill patients:  140 - 180 mg/dL   Results for OVA, GILLENTINE (MRN [id number]) as of 01/16/2018 08:49  Ref. Range 01/15/2018 07:57 01/15/2018 11:36 01/15/2018 16:38 01/15/2018 21:13 01/16/2018 07:29  Glucose-Capillary Latest Ref Range: 70 - 99 mg/dL 233 (H) 407 (H) 151 (H) 232 (H) 195 (H)   Review of Glycemic Control   Current orders for Inpatient glycemic control: Lantus 10 units, Novolog 0-15 units tid  Receiving IV solumedrol 60 mg Q6 hours  Inpatient Diabetes Program Recommendations:    Glucose trends increase significantly after meals due to steroids. Please consider Novolog 4 units tid meal coverage in addition to correction scale if patient consumes at least 50% of meals.  Thanks,  Tama Headings RN, MSN, BC-ADM, [facility] Inpatient Diabetes Coordinator Team Pager [phone] (8a-5p)

## 2018-01-16 NOTE — Plan of Care (Signed)
  Problem: Education: Goal: Knowledge of General Education information will improve Outcome: Progressing   Problem: Education: Goal: Knowledge of disease or condition will improve Outcome: Progressing Goal: Knowledge of the prescribed therapeutic regimen will improve Outcome: Progressing   Problem: Clinical Measurements: Goal: Ability to maintain a body temperature in the normal range will improve Outcome: Progressing   Problem: Respiratory: Goal: Ability to maintain adequate ventilation will improve Outcome: Progressing Goal: Ability to maintain a clear airway will improve Outcome: Progressing

## 2018-01-17 LAB — GLUCOSE, CAPILLARY
GLUCOSE-CAPILLARY: 170 mg/dL — AB (ref 70–99)
Glucose-Capillary: 226 mg/dL — ABNORMAL HIGH (ref 70–99)

## 2018-01-17 LAB — BASIC METABOLIC PANEL
ANION GAP: 9 (ref 5–15)
BUN: 29 mg/dL — ABNORMAL HIGH (ref 8–23)
CALCIUM: 9 mg/dL (ref 8.9–10.3)
CO2: 29 mmol/L (ref 22–32)
Chloride: 96 mmol/L — ABNORMAL LOW (ref 98–111)
Creatinine, Ser: 0.81 mg/dL (ref 0.44–1.00)
Glucose, Bld: 244 mg/dL — ABNORMAL HIGH (ref 70–99)
Potassium: 4.1 mmol/L (ref 3.5–5.1)
SODIUM: 134 mmol/L — AB (ref 135–145)

## 2018-01-17 MED ORDER — LEVOFLOXACIN 500 MG PO TABS: 500.0000 mg | ORAL_TABLET | Freq: Every day | ORAL | 0 refills | Status: AC

## 2018-01-17 MED ORDER — GUAIFENESIN ER 600 MG PO TB12: 600.0000 mg | ORAL_TABLET | Freq: Two times a day (BID) | ORAL | 0 refills | Status: AC

## 2018-01-17 MED ORDER — PREDNISONE 10 MG (21) PO TBPK: ORAL_TABLET | ORAL | 0 refills | Status: DC

## 2018-01-17 NOTE — Discharge Summary (Signed)
Regina Bernard at [facility], 62 y.o., DOB 1956/07/04, MRN [id number]. Admission date: 01/14/2018 Discharge Date 01/17/2018 Primary MD Juluis Pitch, MD Admitting Physician Saundra Shelling, MD  Admission Diagnosis  Chest Pain Shob  Discharge Diagnosis   Active Problems: Acute on chronic COPD exacerbation ([place]) Community-acquired pneumonia Acute on chronic respiratory failure Hyperglycemia due to steroid Hypokalemia Anxiety disorder Hyponatremia Tobacco abuse   Hospital Course  62 year old female patient with history of COPD on home oxygen, breast cancer, anxiety disorder, hepatitis C presented to the emergency room with increased shortness of breath, cough.  Patient was evaluated in the ER and chest x-ray showed pneumonia.  She was admitted for acute on chronic COPD exacerbation and pneumonia.  Patient was treated with antibiotics.  Now doing much better.  Breathing much improved. Patient was noted to have hyperglycemia due to steroid treatment and her hemoglobin A1c was normal.             Consults  None  Significant Tests:  See full reports for all details     Dg Chest 2 View  Result Date: 01/14/2018 CLINICAL DATA:  Chest pain, shortness of breath, diarrhea, productive cough. History of breast malignancy, COPD, former smoker. EXAM: CHEST - 2 VIEW COMPARISON:  CT scan of the chest of September 17, 2017 FINDINGS: The lungs are well-expanded. The interstitial markings are increased bilaterally. There is focal increased density just above the left lateral costophrenic angle. The heart and pulmonary vascularity are normal. The mediastinum is normal in width. There is calcification in the wall of the aortic arch. There is moderate dextrocurvature of the thoracic spine centered at approximately T10. IMPRESSION: Diffusely increased lung markings bilaterally are worrisome for pneumonia. Noncardiac pulmonary edema could present in similar fashion  but there is minimal pulmonary vascular congestion. Subsegmental atelectasis at the left lung base posteriorly is suspected. Thoracic aortic atherosclerosis. Electronically Signed   By: David  Martinique M.D.   On: 01/14/2018 16:03       Today   Subjective:   Allean Found patient's breathing much improved denies any complaints Objective:   Blood pressure (!) 156/90, pulse 70, temperature 97.8 F (36.6 C), temperature source Oral, resp. rate 18, height 5\' 7"  (1.702 m), weight 75.7 kg (166 lb 14.4 oz), SpO2 97 %.  .  Intake/Output Summary (Last 24 hours) at 01/17/2018 1158 Last data filed at 01/16/2018 1831 Gross per 24 hour  Intake 600 ml  Output -  Net 600 ml    Exam VITAL SIGNS: Blood pressure (!) 156/90, pulse 70, temperature 97.8 F (36.6 C), temperature source Oral, resp. rate 18, height 5\' 7"  (1.702 m), weight 75.7 kg (166 lb 14.4 oz), SpO2 97 %.  GENERAL:  62 y.o.-year-old patient lying in the bed with no acute distress.  EYES: Pupils equal, round, reactive to light and accommodation. No scleral icterus. Extraocular muscles intact.  HEENT: Head atraumatic, normocephalic. Oropharynx and nasopharynx clear.  NECK:  Supple, no jugular venous distention. No thyroid enlargement, no tenderness.  LUNGS: Normal breath sounds bilaterally, no wheezing, rales,rhonchi or crepitation. No use of accessory muscles of respiration.  CARDIOVASCULAR: S1, S2 normal. No murmurs, rubs, or gallops.  ABDOMEN: Soft, nontender, nondistended. Bowel sounds present. No organomegaly or mass.  EXTREMITIES: No pedal edema, cyanosis, or clubbing.  NEUROLOGIC: Cranial nerves II through XII are intact. Muscle strength 5/5 in all extremities. Sensation intact. Gait not checked.  PSYCHIATRIC: The patient is alert and oriented x 3.  SKIN: No obvious  rash, lesion, or ulcer.   Data Review     CBC w Diff:  Lab Results  Component Value Date   WBC 22.9 (H) 01/16/2018   HGB 12.3 01/16/2018   HGB 14.5 10/12/2014    HCT 35.8 01/16/2018   HCT 43.6 10/12/2014   PLT 239 01/16/2018   PLT 248 10/12/2014   LYMPHOPCT 3 01/14/2018   LYMPHOPCT 15.1 10/12/2014   MONOPCT 3 01/14/2018   MONOPCT 8.4 10/12/2014   EOSPCT 0 01/14/2018   EOSPCT 2.0 10/12/2014   BASOPCT 0 01/14/2018   BASOPCT 0.5 10/12/2014   CMP:  Lab Results  Component Value Date   NA 131 (L) 01/16/2018   NA 133 (L) 07/04/2013   K 5.6 (H) 01/16/2018   K 3.5 07/04/2013   CL 94 (L) 01/16/2018   CL 99 07/04/2013   CO2 31 01/16/2018   CO2 30 07/04/2013   BUN 28 (H) 01/16/2018   BUN 15 07/04/2013   CREATININE 0.85 01/16/2018   CREATININE 0.83 10/12/2014   PROT 8.5 (H) 01/14/2018   PROT 8.5 (H) 10/12/2014   ALBUMIN 3.5 01/14/2018   ALBUMIN 4.1 10/12/2014   BILITOT 1.0 01/14/2018   BILITOT 0.6 10/12/2014   ALKPHOS 122 01/14/2018   ALKPHOS 69 10/12/2014   AST 42 (H) 01/14/2018   AST 44 (H) 10/12/2014   ALT 26 01/14/2018   ALT 28 10/12/2014  .  Micro Results Recent Results (from the past 240 hour(s))  Blood culture (routine x 2)     Status: None (Preliminary result)   Collection Time: 01/14/18  2:41 PM  Result Value Ref Range Status   Specimen Description BLOOD RIGHT FATTY CASTS  Final   Special Requests   Final    BOTTLES DRAWN AEROBIC AND ANAEROBIC Blood Culture results may not be optimal due to an excessive volume of blood received in culture bottles   Culture   Final    NO GROWTH 3 DAYS Performed at [facility], [address], [place], [place] [address]    Report Status PENDING  Incomplete  Blood culture (routine x 2)     Status: None (Preliminary result)   Collection Time: 01/14/18  2:42 PM  Result Value Ref Range Status   Specimen Description BLOOD RIGHT FATTY CASTS  Final   Special Requests   Final    BOTTLES DRAWN AEROBIC AND ANAEROBIC Blood Culture results may not be optimal due to an excessive volume of blood received in culture bottles   Culture   Final    NO GROWTH 3 DAYS Performed at [facility], [address], [place], [place] [address]    Report Status PENDING  Incomplete        Code Status Orders  (From admission, onward)        Start     Ordered   01/14/18 1613  Full code  Continuous     01/14/18 1612    Code Status History    This patient has a current code status but no historical code status.          Follow-up Information    Juluis Pitch, MD Follow up in 6 day(s).   Specialty:  Family Medicine Contact information: [place]  [phone]           Discharge Medications   Allergies as of 01/17/2018      Reactions   Penicillins Swelling, Rash   Has patient had a PCN reaction causing immediate rash,  facial/tongue/throat swelling, SOB or lightheadedness with hypotension: Yes Has patient had a PCN reaction causing severe rash involving mucus membranes or skin necrosis: No Has patient had a PCN reaction that required hospitalization: No Has patient had a PCN reaction occurring within the last 10 years: No If all of the above answers are "NO", then may proceed with Cephalosporin use.      Medication List    TAKE these medications   albuterol (2.5 MG/3ML) 0.083% nebulizer solution Commonly known as:  PROVENTIL Take 2.5 mg by nebulization every 6 (six) hours as needed for wheezing.   BREO ELLIPTA 100-25 MCG/INH Aepb Generic drug:  fluticasone furoate-vilanterol Inhale 1 puff into the lungs daily.   COMBIVENT RESPIMAT 20-100 MCG/ACT Aers respimat Generic drug:  Ipratropium-Albuterol Inhale 1 puff into the lungs every 6 (six) hours as needed for wheezing or shortness of breath.   DULoxetine 30 MG capsule Commonly known as:  CYMBALTA Take 1 capsule (30 mg total) by mouth 2 (two) times daily.   gabapentin 300 MG capsule Commonly known as:  NEURONTIN TAKE 1 CAPSULE BY MOUTH 3  TIMES DAILY   guaiFENesin 600 MG 12 hr tablet Commonly known as:  MUCINEX Take 1 tablet (600 mg total) by mouth 2  (two) times daily for 6 days.   levofloxacin 500 MG tablet Commonly known as:  LEVAQUIN Take 1 tablet (500 mg total) by mouth daily for 5 days.   metoprolol succinate 100 MG 24 hr tablet Commonly known as:  TOPROL-XL Take 100 mg by mouth daily. Take with or immediately following a meal.   MULTI-VITAMINS Tabs Take 1 tablet by mouth daily.   OXYGEN Inhale 2 L into the lungs at bedtime and may repeat dose one time if needed.   predniSONE 10 MG (21) Tbpk tablet Commonly known as:  STERAPRED UNI-PAK 21 TAB Start at 60mg  taper by 10mg  until complere            Durable Medical Equipment  (From admission, onward)        Start     Ordered   01/17/18 1157  DME Oxygen  Once    Question Answer Comment  Mode or (Route) Nasal cannula   Liters per Minute 2   Frequency Continuous (stationary and portable oxygen unit needed)   Oxygen delivery system Gas      01/17/18 1157         Total Time in preparing paper work, data evaluation and todays exam - 9 minutes  Dustin Flock M.D on 01/17/2018 at 11:58 AM [place]  [phone]

## 2018-01-19 LAB — CULTURE, BLOOD (ROUTINE X 2)
CULTURE: NO GROWTH
Culture: NO GROWTH

## 2018-01-27 DIAGNOSIS — R0609 Other forms of dyspnea: Secondary | ICD-10-CM | POA: Diagnosis not present

## 2018-01-27 DIAGNOSIS — J449 Chronic obstructive pulmonary disease, unspecified: Secondary | ICD-10-CM | POA: Diagnosis not present

## 2018-01-27 DIAGNOSIS — Z9981 Dependence on supplemental oxygen: Secondary | ICD-10-CM | POA: Diagnosis not present

## 2018-01-31 DIAGNOSIS — J449 Chronic obstructive pulmonary disease, unspecified: Secondary | ICD-10-CM | POA: Diagnosis not present

## 2018-03-02 NOTE — Progress Notes (Signed)
Pt was not evaluated by me in the clinic-given her acute symptoms of shortness of breath tachycardia; sent over to the emergency room.

## 2018-03-03 DIAGNOSIS — J449 Chronic obstructive pulmonary disease, unspecified: Secondary | ICD-10-CM | POA: Diagnosis not present

## 2018-04-02 DIAGNOSIS — R0609 Other forms of dyspnea: Secondary | ICD-10-CM | POA: Diagnosis not present

## 2018-04-02 DIAGNOSIS — J441 Chronic obstructive pulmonary disease with (acute) exacerbation: Secondary | ICD-10-CM | POA: Diagnosis not present

## 2018-04-03 DIAGNOSIS — J449 Chronic obstructive pulmonary disease, unspecified: Secondary | ICD-10-CM | POA: Diagnosis not present

## 2018-05-03 DIAGNOSIS — J449 Chronic obstructive pulmonary disease, unspecified: Secondary | ICD-10-CM | POA: Diagnosis not present

## 2018-05-28 ENCOUNTER — Other Ambulatory Visit: Payer: Self-pay | Admitting: Family Medicine

## 2018-05-28 DIAGNOSIS — Z1231 Encounter for screening mammogram for malignant neoplasm of breast: Secondary | ICD-10-CM

## 2018-06-03 ENCOUNTER — Inpatient Hospital Stay (HOSPITAL_BASED_OUTPATIENT_CLINIC_OR_DEPARTMENT_OTHER): Payer: 59 | Admitting: Internal Medicine

## 2018-06-03 ENCOUNTER — Inpatient Hospital Stay: Payer: 59 | Attending: Internal Medicine

## 2018-06-03 ENCOUNTER — Encounter: Payer: Self-pay | Admitting: Internal Medicine

## 2018-06-03 ENCOUNTER — Inpatient Hospital Stay: Payer: 59 | Admitting: Internal Medicine

## 2018-06-03 ENCOUNTER — Inpatient Hospital Stay: Payer: 59

## 2018-06-03 ENCOUNTER — Other Ambulatory Visit: Payer: Self-pay

## 2018-06-03 VITALS — BP 124/80 | HR 76 | Temp 98.8°F | Resp 20 | Ht 67.0 in | Wt 160.0 lb

## 2018-06-03 DIAGNOSIS — B1920 Unspecified viral hepatitis C without hepatic coma: Secondary | ICD-10-CM

## 2018-06-03 DIAGNOSIS — C50811 Malignant neoplasm of overlapping sites of right female breast: Secondary | ICD-10-CM

## 2018-06-03 DIAGNOSIS — I10 Essential (primary) hypertension: Secondary | ICD-10-CM

## 2018-06-03 DIAGNOSIS — Z9981 Dependence on supplemental oxygen: Secondary | ICD-10-CM | POA: Diagnosis not present

## 2018-06-03 DIAGNOSIS — K7460 Unspecified cirrhosis of liver: Secondary | ICD-10-CM

## 2018-06-03 DIAGNOSIS — F419 Anxiety disorder, unspecified: Secondary | ICD-10-CM | POA: Insufficient documentation

## 2018-06-03 DIAGNOSIS — Z170 Estrogen receptor positive status [ER+]: Secondary | ICD-10-CM | POA: Diagnosis not present

## 2018-06-03 DIAGNOSIS — J449 Chronic obstructive pulmonary disease, unspecified: Secondary | ICD-10-CM | POA: Insufficient documentation

## 2018-06-03 DIAGNOSIS — G629 Polyneuropathy, unspecified: Secondary | ICD-10-CM | POA: Diagnosis not present

## 2018-06-03 DIAGNOSIS — Z79899 Other long term (current) drug therapy: Secondary | ICD-10-CM | POA: Insufficient documentation

## 2018-06-03 DIAGNOSIS — F17210 Nicotine dependence, cigarettes, uncomplicated: Secondary | ICD-10-CM | POA: Insufficient documentation

## 2018-06-03 LAB — CBC WITH DIFFERENTIAL/PLATELET
Abs Immature Granulocytes: 0.02 10*3/uL (ref 0.00–0.07)
BASOS ABS: 0 10*3/uL (ref 0.0–0.1)
Basophils Relative: 1 %
Eosinophils Absolute: 0.3 10*3/uL (ref 0.0–0.5)
Eosinophils Relative: 3 %
HCT: 44.6 % (ref 36.0–46.0)
HEMOGLOBIN: 15.1 g/dL — AB (ref 12.0–15.0)
IMMATURE GRANULOCYTES: 0 %
LYMPHS PCT: 21 %
Lymphs Abs: 1.6 10*3/uL (ref 0.7–4.0)
MCH: 33.1 pg (ref 26.0–34.0)
MCHC: 33.9 g/dL (ref 30.0–36.0)
MCV: 97.8 fL (ref 80.0–100.0)
Monocytes Absolute: 0.6 10*3/uL (ref 0.1–1.0)
Monocytes Relative: 8 %
NEUTROS PCT: 67 %
NRBC: 0 % (ref 0.0–0.2)
Neutro Abs: 5.3 10*3/uL (ref 1.7–7.7)
Platelets: 253 10*3/uL (ref 150–400)
RBC: 4.56 MIL/uL (ref 3.87–5.11)
RDW: 12.4 % (ref 11.5–15.5)
WBC: 7.9 10*3/uL (ref 4.0–10.5)

## 2018-06-03 LAB — COMPREHENSIVE METABOLIC PANEL
ALT: 18 U/L (ref 0–44)
ANION GAP: 5 (ref 5–15)
AST: 31 U/L (ref 15–41)
Albumin: 4 g/dL (ref 3.5–5.0)
Alkaline Phosphatase: 93 U/L (ref 38–126)
BUN: 9 mg/dL (ref 8–23)
CHLORIDE: 100 mmol/L (ref 98–111)
CO2: 29 mmol/L (ref 22–32)
Calcium: 10.2 mg/dL (ref 8.9–10.3)
Creatinine, Ser: 0.86 mg/dL (ref 0.44–1.00)
GFR calc non Af Amer: >60 mL/min (ref >60)
Glucose, Bld: 111 mg/dL — ABNORMAL HIGH (ref 70–99)
POTASSIUM: 4.6 mmol/L (ref 3.5–5.1)
SODIUM: 134 mmol/L — AB (ref 135–145)
Total Bilirubin: 0.5 mg/dL (ref 0.3–1.2)
Total Protein: 8.2 g/dL — ABNORMAL HIGH (ref 6.5–8.1)

## 2018-06-03 NOTE — Assessment & Plan Note (Addendum)
#   RIGHT BREAST CA STAGE II ER/PR- Pos; her 2 NEG; clinically no evidence of recurrence noted; STOPPED  aromatase inhibitor Secondary to intolerance. mammo-Nov 2018- NEG. stable.  # Left upper lobe lung nodule- approximately one centimeter in size. CT scan March 2019- recommend CT non-contrast in march 2020.   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  Discussed regarding neuro evaluation; patient reluctant.  # anxiety/ depression/ memory issues-stable/Improved.   # cirrhosis- on CT-/Macrocytosis noted likely from alcohol. Stable.   #  DISPOSITION:  # follow up end of march 2020;labs- cbc/cmp/CT chest prior- Dr.B

## 2018-06-03 NOTE — Progress Notes (Signed)
Patient requesting RF on valium 5 mg. Per patient, Dr. Vella Kohler previously prescribed this for her to prevent worsening shortness of breath induced by anxiety.

## 2018-06-05 DIAGNOSIS — Z23 Encounter for immunization: Secondary | ICD-10-CM | POA: Diagnosis not present

## 2018-06-09 NOTE — Progress Notes (Signed)
[facility] OFFICE PROGRESS NOTE  Patient Care Team: Juluis Pitch, MD as PCP - General (Family Medicine)   Oncology History   # RIGHT BREAST STAGE II [pT1psN1; neg 15LN s/p ALND] s/p Lumpec [Dr.Smith] s/p RT; AC-taxol; [name]; April 2016];STOPPED AI on Jan 2017.  April 2016- Mammo NEG  # LUL lung nodule- [poor surgical candidate]  # PN- G-2 on Neurontin  # Hot flashes- sec to AI. COPD- on 2 Lit [place] [Dr.Fleming]     Carcinoma of overlapping sites of right breast in female, estrogen receptor positive ([place])     INTERVAL HISTORY:  62 year old female patient with a history of stage II breast cancer COPD on home O2 is here for follow-up.  Patient is not on aromatase inhibitor because of intolerance/side effects.  Patient continues to have chronic shortness of breath chronic cough not any worse.  She continues to complain of chronic tingling and numbness of the extremities.  Review of Systems  Constitutional: Positive for malaise/fatigue. Negative for chills, diaphoresis, fever and weight loss.  HENT: Negative for nosebleeds and sore throat.   Eyes: Negative for double vision.  Respiratory: Positive for cough and shortness of breath. Negative for hemoptysis, sputum production and wheezing.   Cardiovascular: Negative for chest pain, palpitations, orthopnea and leg swelling.  Gastrointestinal: Negative for abdominal pain, blood in stool, constipation, diarrhea, heartburn, melena, nausea and vomiting.  Genitourinary: Negative for dysuria, frequency and urgency.  Musculoskeletal: Negative for back pain and joint pain.  Skin: Negative.  Negative for itching and rash.  Neurological: Positive for tingling. Negative for dizziness, focal weakness, weakness and headaches.  Endo/Heme/Allergies: Does not bruise/bleed easily.  Psychiatric/Behavioral: Negative for depression. The patient is nervous/anxious. The patient does not have insomnia.      PAST MEDICAL HISTORY  :  Past Medical History:  Diagnosis Date  . Abnormal CT scan, chest   . Anxiety   . Asthma   . Breast cancer ([place])    Breast CA- Right   . Breast cancer ([place]) 06/18/2011   right breast cancer  . COPD (chronic obstructive pulmonary disease) ([place])   . Endometriosis    tx with vaginal hysterectomy  . Fatty liver   . Hepatitis C 1998   [facility] trial treatment in-patient study 2005. HCV 740, 05/20/11.  [name] History of substance abuse ([place])    Heroine, cocaine, marijuana. States all of them. Heavy use 1995 to 2000. Occasional use before and after. None since 2006.   [name] Hot flashes related to aromatase inhibitor therapy   . Hypertension   . Lung mass   . Peripheral neuropathy    in hands and feet    PAST SURGICAL HISTORY :   Past Surgical History:  Procedure Laterality Date  . BREAST LUMPECTOMY Right 2013  . TUBAL LIGATION  1996  . Upper right leg benign tumor removed    . VAGINAL HYSTERECTOMY  2001    FAMILY HISTORY :   Family History  Problem Relation Age of Onset  . Diabetes Maternal Grandmother   . Lung cancer Father   . Lung cancer Mother   . Hypertension Mother   . Heart disease Mother   . Skin cancer Mother     SOCIAL HISTORY:   Social History   Tobacco Use  . Smoking status: Current Every Day Smoker    Packs/day: 0.25    Years: 44.00    Pack years: 11.00    Types: Cigarettes    Last attempt to quit: 03/21/2016  Years since quitting: 2.2  . Smokeless tobacco: Never Used  . Tobacco comment: last cigeratte use 6 days ago (11/30/15)  Substance Use Topics  . Alcohol use: Yes    Alcohol/week: 0.0 standard drinks  . Drug use: No    ALLERGIES:  is allergic to penicillins.  MEDICATIONS:  Current Outpatient Medications  Medication Sig Dispense Refill  . albuterol (PROVENTIL) (2.5 MG/3ML) 0.083% nebulizer solution Take 2.5 mg by nebulization every 6 (six) hours as needed for wheezing.     [name] BREO ELLIPTA 100-25 MCG/INH AEPB Inhale 1 puff into the lungs daily.    .  COMBIVENT RESPIMAT 20-100 MCG/ACT AERS respimat Inhale 1 puff into the lungs every 6 (six) hours as needed for wheezing or shortness of breath.     . DULoxetine (CYMBALTA) 30 MG capsule Take 1 capsule (30 mg total) by mouth 2 (two) times daily. 180 capsule 3  . gabapentin (NEURONTIN) 300 MG capsule TAKE 1 CAPSULE BY MOUTH 3  TIMES DAILY 270 capsule 1  . metoprolol succinate (TOPROL-XL) 100 MG 24 hr tablet Take 100 mg by mouth daily. Take with or immediately following a meal.    . Multiple Vitamin (MULTI-VITAMINS) TABS Take 1 tablet by mouth daily.     . OXYGEN Inhale 2 L into the lungs at bedtime and may repeat dose one time if needed.      No current facility-administered medications for this visit.     PHYSICAL EXAMINATION: ECOG PERFORMANCE STATUS: 0 - Asymptomatic  BP 124/80 (Patient Position: Sitting)   Pulse 76   Temp 98.8 F (37.1 C) (Oral)   Resp 20   Ht 5\' 7"  (1.702 m)   Wt 160 lb (72.6 kg)   BMI 25.06 kg/m   Filed Weights   06/03/18 1123  Weight: 160 lb (72.6 kg)    Physical Exam  Constitutional: She is oriented to person, place, and time and well-developed, well-nourished, and in no distress.  Accompanied by family.  Nasal cannula oxygen.  HENT:  Head: Normocephalic and atraumatic.  Mouth/Throat: Oropharynx is clear and moist. No oropharyngeal exudate.  Eyes: Pupils are equal, round, and reactive to light.  Neck: Normal range of motion. Neck supple.  Cardiovascular: Normal rate and regular rhythm.  Pulmonary/Chest: No respiratory distress. She has no wheezes.  Decreased air entry bilaterally.  Abdominal: Soft. Bowel sounds are normal. She exhibits no distension and no mass. There is no tenderness. There is no rebound and no guarding.  Musculoskeletal: Normal range of motion. She exhibits no edema or tenderness.  Neurological: She is alert and oriented to person, place, and time.  Skin: Skin is warm.  Right and left BREAST exam (in the presence of nurse)- no  unusual skin changes or dominant masses felt. Surgical scars noted.    Psychiatric: Affect normal.       LABORATORY DATA:  I have reviewed the data as listed    Component Value Date/Time   NA 134 (L) 06/03/2018 1052   NA 133 (L) 07/04/2013 1216   K 4.6 06/03/2018 1052   K 3.5 07/04/2013 1216   CL 100 06/03/2018 1052   CL 99 07/04/2013 1216   CO2 29 06/03/2018 1052   CO2 30 07/04/2013 1216   GLUCOSE 111 (H) 06/03/2018 1052   GLUCOSE 100 (H) 07/04/2013 1216   BUN 9 06/03/2018 1052   BUN 15 07/04/2013 1216   CREATININE 0.86 06/03/2018 1052   CREATININE 0.83 10/12/2014 1239   CALCIUM 10.2 06/03/2018 1052   CALCIUM  8.8 07/04/2013 1216   PROT 8.2 (H) 06/03/2018 1052   PROT 8.5 (H) 10/12/2014 1239   ALBUMIN 4.0 06/03/2018 1052   ALBUMIN 4.1 10/12/2014 1239   AST 31 06/03/2018 1052   AST 44 (H) 10/12/2014 1239   ALT 18 06/03/2018 1052   ALT 28 10/12/2014 1239   ALKPHOS 93 06/03/2018 1052   ALKPHOS 69 10/12/2014 1239   BILITOT 0.5 06/03/2018 1052   BILITOT 0.6 10/12/2014 1239   GFRNONAA >60 06/03/2018 1052   GFRNONAA >60 10/12/2014 1239   GFRAA >60 06/03/2018 1052   GFRAA >60 10/12/2014 1239    No results found for: SPEP, UPEP  Lab Results  Component Value Date   WBC 7.9 06/03/2018   NEUTROABS 5.3 06/03/2018   HGB 15.1 (H) 06/03/2018   HCT 44.6 06/03/2018   MCV 97.8 06/03/2018   PLT 253 06/03/2018      Chemistry      Component Value Date/Time   NA 134 (L) 06/03/2018 1052   NA 133 (L) 07/04/2013 1216   K 4.6 06/03/2018 1052   K 3.5 07/04/2013 1216   CL 100 06/03/2018 1052   CL 99 07/04/2013 1216   CO2 29 06/03/2018 1052   CO2 30 07/04/2013 1216   BUN 9 06/03/2018 1052   BUN 15 07/04/2013 1216   CREATININE 0.86 06/03/2018 1052   CREATININE 0.83 10/12/2014 1239      Component Value Date/Time   CALCIUM 10.2 06/03/2018 1052   CALCIUM 8.8 07/04/2013 1216   ALKPHOS 93 06/03/2018 1052   ALKPHOS 69 10/12/2014 1239   AST 31 06/03/2018 1052   AST 44 (H)  10/12/2014 1239   ALT 18 06/03/2018 1052   ALT 28 10/12/2014 1239   BILITOT 0.5 06/03/2018 1052   BILITOT 0.6 10/12/2014 1239         ASSESSMENT & PLAN:  Carcinoma of overlapping sites of right breast in female, estrogen receptor positive ([place]) # RIGHT BREAST CA STAGE II ER/PR- Pos; her 2 NEG; clinically no evidence of recurrence noted; STOPPED  aromatase inhibitor Secondary to intolerance. mammo-Nov 2018- NEG. stable.  # Left upper lobe lung nodule- approximately one centimeter in size. CT scan March 2019- recommend CT non-contrast in march 2020.   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  Discussed regarding neuro evaluation; patient reluctant.  # anxiety/ depression/ memory issues-stable/Improved.   # cirrhosis- on CT-/Macrocytosis noted likely from alcohol. Stable.   #  DISPOSITION:  # follow up end of march 2020;labs- cbc/cmp/CT chest prior- Dr.B     Cammie Sickle, MD 06/09/2018 4:42 PM

## 2018-06-18 ENCOUNTER — Ambulatory Visit
Admission: RE | Admit: 2018-06-18 | Discharge: 2018-06-18 | Disposition: A | Discharge status: Home / Self Care | Payer: 59 | Source: Ambulatory Visit | Attending: Family Medicine | Admitting: Family Medicine

## 2018-06-18 DIAGNOSIS — Z1231 Encounter for screening mammogram for malignant neoplasm of breast: Secondary | ICD-10-CM | POA: Insufficient documentation

## 2018-06-26 DIAGNOSIS — R0609 Other forms of dyspnea: Secondary | ICD-10-CM | POA: Diagnosis not present

## 2018-06-26 DIAGNOSIS — R05 Cough: Secondary | ICD-10-CM | POA: Diagnosis not present

## 2018-06-26 DIAGNOSIS — J449 Chronic obstructive pulmonary disease, unspecified: Secondary | ICD-10-CM | POA: Diagnosis not present

## 2018-07-03 DIAGNOSIS — J449 Chronic obstructive pulmonary disease, unspecified: Secondary | ICD-10-CM | POA: Diagnosis not present

## 2018-08-03 DIAGNOSIS — J449 Chronic obstructive pulmonary disease, unspecified: Secondary | ICD-10-CM | POA: Diagnosis not present

## 2018-09-03 DIAGNOSIS — J449 Chronic obstructive pulmonary disease, unspecified: Secondary | ICD-10-CM | POA: Diagnosis not present

## 2018-09-21 ENCOUNTER — Other Ambulatory Visit: Payer: 59

## 2018-09-21 ENCOUNTER — Ambulatory Visit: Payer: 59 | Admitting: Internal Medicine

## 2018-09-29 ENCOUNTER — Ambulatory Visit: Admission: RE | Admit: 2018-09-29 | Payer: 59 | Source: Ambulatory Visit

## 2018-10-01 ENCOUNTER — Other Ambulatory Visit: Payer: 59

## 2018-10-01 ENCOUNTER — Ambulatory Visit: Payer: 59 | Admitting: Internal Medicine

## 2018-10-02 DIAGNOSIS — J449 Chronic obstructive pulmonary disease, unspecified: Secondary | ICD-10-CM | POA: Diagnosis not present

## 2018-11-02 DIAGNOSIS — J449 Chronic obstructive pulmonary disease, unspecified: Secondary | ICD-10-CM | POA: Diagnosis not present

## 2018-11-17 ENCOUNTER — Other Ambulatory Visit: Payer: Self-pay

## 2018-11-17 ENCOUNTER — Ambulatory Visit: Payer: 59

## 2018-11-18 ENCOUNTER — Inpatient Hospital Stay: Payer: 59 | Attending: Internal Medicine

## 2018-11-18 ENCOUNTER — Other Ambulatory Visit: Payer: Self-pay

## 2018-11-18 ENCOUNTER — Inpatient Hospital Stay (HOSPITAL_BASED_OUTPATIENT_CLINIC_OR_DEPARTMENT_OTHER): Payer: 59 | Admitting: Internal Medicine

## 2018-11-18 ENCOUNTER — Encounter: Payer: Self-pay | Admitting: Internal Medicine

## 2018-11-18 VITALS — BP 158/91 | HR 62 | Temp 97.0°F | Resp 22 | Ht 67.0 in | Wt 159.6 lb

## 2018-11-18 DIAGNOSIS — Z833 Family history of diabetes mellitus: Secondary | ICD-10-CM | POA: Insufficient documentation

## 2018-11-18 DIAGNOSIS — F1211 Cannabis abuse, in remission: Secondary | ICD-10-CM | POA: Diagnosis not present

## 2018-11-18 DIAGNOSIS — K7460 Unspecified cirrhosis of liver: Secondary | ICD-10-CM

## 2018-11-18 DIAGNOSIS — Z170 Estrogen receptor positive status [ER+]: Secondary | ICD-10-CM | POA: Insufficient documentation

## 2018-11-18 DIAGNOSIS — F1411 Cocaine abuse, in remission: Secondary | ICD-10-CM | POA: Diagnosis not present

## 2018-11-18 DIAGNOSIS — R911 Solitary pulmonary nodule: Secondary | ICD-10-CM | POA: Diagnosis not present

## 2018-11-18 DIAGNOSIS — Z808 Family history of malignant neoplasm of other organs or systems: Secondary | ICD-10-CM | POA: Insufficient documentation

## 2018-11-18 DIAGNOSIS — R05 Cough: Secondary | ICD-10-CM | POA: Diagnosis not present

## 2018-11-18 DIAGNOSIS — F17210 Nicotine dependence, cigarettes, uncomplicated: Secondary | ICD-10-CM | POA: Insufficient documentation

## 2018-11-18 DIAGNOSIS — J449 Chronic obstructive pulmonary disease, unspecified: Secondary | ICD-10-CM | POA: Diagnosis not present

## 2018-11-18 DIAGNOSIS — R202 Paresthesia of skin: Secondary | ICD-10-CM | POA: Diagnosis not present

## 2018-11-18 DIAGNOSIS — R5383 Other fatigue: Secondary | ICD-10-CM | POA: Diagnosis not present

## 2018-11-18 DIAGNOSIS — Z9981 Dependence on supplemental oxygen: Secondary | ICD-10-CM

## 2018-11-18 DIAGNOSIS — R0602 Shortness of breath: Secondary | ICD-10-CM | POA: Insufficient documentation

## 2018-11-18 DIAGNOSIS — Z801 Family history of malignant neoplasm of trachea, bronchus and lung: Secondary | ICD-10-CM | POA: Diagnosis not present

## 2018-11-18 DIAGNOSIS — C50811 Malignant neoplasm of overlapping sites of right female breast: Secondary | ICD-10-CM | POA: Diagnosis present

## 2018-11-18 DIAGNOSIS — Z7289 Other problems related to lifestyle: Secondary | ICD-10-CM | POA: Diagnosis not present

## 2018-11-18 DIAGNOSIS — Z8249 Family history of ischemic heart disease and other diseases of the circulatory system: Secondary | ICD-10-CM | POA: Insufficient documentation

## 2018-11-18 DIAGNOSIS — F1511 Other stimulant abuse, in remission: Secondary | ICD-10-CM | POA: Insufficient documentation

## 2018-11-18 LAB — CBC WITH DIFFERENTIAL/PLATELET
Abs Immature Granulocytes: 0.01 10*3/uL (ref 0.00–0.07)
Basophils Absolute: 0.1 10*3/uL (ref 0.0–0.1)
Basophils Relative: 1 %
Eosinophils Absolute: 0.2 10*3/uL (ref 0.0–0.5)
Eosinophils Relative: 3 %
HCT: 41.3 % (ref 36.0–46.0)
Hemoglobin: 13.7 g/dL (ref 12.0–15.0)
Immature Granulocytes: 0 %
Lymphocytes Relative: 18 %
Lymphs Abs: 1.3 10*3/uL (ref 0.7–4.0)
MCH: 33.9 pg (ref 26.0–34.0)
MCHC: 33.2 g/dL (ref 30.0–36.0)
MCV: 102.2 fL — ABNORMAL HIGH (ref 80.0–100.0)
Monocytes Absolute: 0.6 10*3/uL (ref 0.1–1.0)
Monocytes Relative: 9 %
Neutro Abs: 4.9 10*3/uL (ref 1.7–7.7)
Neutrophils Relative %: 69 %
Platelets: 236 10*3/uL (ref 150–400)
RBC: 4.04 MIL/uL (ref 3.87–5.11)
RDW: 12.4 % (ref 11.5–15.5)
WBC: 7.1 10*3/uL (ref 4.0–10.5)
nRBC: 0 % (ref 0.0–0.2)

## 2018-11-18 LAB — COMPREHENSIVE METABOLIC PANEL
ALT: 19 U/L (ref 0–44)
AST: 35 U/L (ref 15–41)
Albumin: 4 g/dL (ref 3.5–5.0)
Alkaline Phosphatase: 97 U/L (ref 38–126)
Anion gap: 8 (ref 5–15)
BUN: 13 mg/dL (ref 8–23)
CO2: 30 mmol/L (ref 22–32)
Calcium: 9.5 mg/dL (ref 8.9–10.3)
Chloride: 96 mmol/L — ABNORMAL LOW (ref 98–111)
Creatinine, Ser: 0.91 mg/dL (ref 0.44–1.00)
GFR calc Af Amer: >60 mL/min (ref >60)
GFR calc non Af Amer: >60 mL/min (ref >60)
Glucose, Bld: 101 mg/dL — ABNORMAL HIGH (ref 70–99)
Potassium: 4.3 mmol/L (ref 3.5–5.1)
Sodium: 134 mmol/L — ABNORMAL LOW (ref 135–145)
Total Bilirubin: 0.6 mg/dL (ref 0.3–1.2)
Total Protein: 8.4 g/dL — ABNORMAL HIGH (ref 6.5–8.1)

## 2018-11-18 NOTE — Assessment & Plan Note (Signed)
#   RIGHT BREAST CA STAGE II ER/PR- Pos; her 2 NEG; clinically no evidence of recurrence noted; STOPPED  aromatase inhibitor Secondary to intolerance. mammo-dec 2019- NEG. Stable.   # Left upper lobe lung nodule- approximately one centimeter in size.awaiting CT scan this week. Will call with results.   # Left leg cramp- clinically not DVT.   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 Stable.   # anxiety/ depression/ memory issues-stable.   # cirrhosis- on CT-/Macrocytosis noted likely from alcohol.stable.    #  DISPOSITION: will call with results of CT scan # Follow up in 6 months-MD- labs- cbc/cmp/-Dr.B

## 2018-11-18 NOTE — Progress Notes (Signed)
[place] OFFICE PROGRESS NOTE  Patient Care Team: Juluis Pitch, MD as PCP - General (Family Medicine)   Oncology History Overview Note  # RIGHT BREAST STAGE II [pT1psN1; neg 15LN s/p ALND] s/p Lumpec [Dr.Smith] s/p RT; AC-taxol; [name]; April 2016];STOPPED AI on Jan 2017.  April 2016- Mammo NEG  # LUL lung nodule- [poor surgical candidate]  # PN- G-2 on Neurontin  # Hot flashes- sec to AI. COPD- on 2 Lit [place] [Dr.Fleming]   Carcinoma of overlapping sites of right breast in female, estrogen receptor positive ([place])     INTERVAL HISTORY:  63 year old female patient with a history of stage II breast cancer COPD on home O2 is here for follow-up.  Patient is not on aromatase inhibitor because of intolerance/side effects.  Patient continues to have chronic tingling and numbness in extremities.  This is neither getting any better or worse.  Patient continues her chronic shortness of breath chronic cough.  Complains of occasional cramps in the legs.  No swelling.   Review of Systems  Constitutional: Positive for malaise/fatigue. Negative for chills, diaphoresis, fever and weight loss.  HENT: Negative for nosebleeds and sore throat.   Eyes: Negative for double vision.  Respiratory: Positive for cough and shortness of breath. Negative for hemoptysis, sputum production and wheezing.   Cardiovascular: Negative for chest pain, palpitations, orthopnea and leg swelling.  Gastrointestinal: Negative for abdominal pain, blood in stool, constipation, diarrhea, heartburn, melena, nausea and vomiting.  Genitourinary: Negative for dysuria, frequency and urgency.  Musculoskeletal: Negative for back pain and joint pain.  Skin: Negative.  Negative for itching and rash.  Neurological: Positive for tingling. Negative for dizziness, focal weakness, weakness and headaches.  Endo/Heme/Allergies: Does not bruise/bleed easily.  Psychiatric/Behavioral: Negative for depression. The  patient is nervous/anxious. The patient does not have insomnia.      PAST MEDICAL HISTORY :  Past Medical History:  Diagnosis Date  . Abnormal CT scan, chest   . Anxiety   . Asthma   . Breast cancer ([place])    Breast CA- Right   . Breast cancer ([place]) 06/18/2011   right breast cancer  . COPD (chronic obstructive pulmonary disease) ([place])   . Endometriosis    tx with vaginal hysterectomy  . Fatty liver   . Hepatitis C 1998   [facility] trial treatment in-patient study 2005. HCV 740, 05/20/11.  [name] History of substance abuse ([place])    Heroine, cocaine, marijuana. States all of them. Heavy use 1995 to 2000. Occasional use before and after. None since 2006.   [name] Hot flashes related to aromatase inhibitor therapy   . Hypertension   . Lung mass   . Peripheral neuropathy    in hands and feet    PAST SURGICAL HISTORY :   Past Surgical History:  Procedure Laterality Date  . BREAST LUMPECTOMY Right 2013  . TUBAL LIGATION  1996  . Upper right leg benign tumor removed    . VAGINAL HYSTERECTOMY  2001    FAMILY HISTORY :   Family History  Problem Relation Age of Onset  . Diabetes Maternal Grandmother   . Lung cancer Father   . Lung cancer Mother   . Hypertension Mother   . Heart disease Mother   . Skin cancer Mother   . Breast cancer Neg Hx     SOCIAL HISTORY:   Social History   Tobacco Use  . Smoking status: Current Every Day Smoker    Packs/day: 0.25  Years: 44.00    Pack years: 11.00    Types: Cigarettes    Last attempt to quit: 03/21/2016    Years since quitting: 2.7  . Smokeless tobacco: Never Used  . Tobacco comment: last cigeratte use 6 days ago (11/30/15)  Substance Use Topics  . Alcohol use: Yes    Alcohol/week: 0.0 standard drinks  . Drug use: No    ALLERGIES:  is allergic to penicillins.  MEDICATIONS:  Current Outpatient Medications  Medication Sig Dispense Refill  . albuterol (PROVENTIL) (2.5 MG/3ML) 0.083% nebulizer solution Take 2.5 mg by nebulization every  6 (six) hours as needed for wheezing.     [name] BREO ELLIPTA 100-25 MCG/INH AEPB Inhale 1 puff into the lungs daily.    . DULoxetine (CYMBALTA) 30 MG capsule Take 1 capsule (30 mg total) by mouth 2 (two) times daily. 180 capsule 3  . gabapentin (NEURONTIN) 300 MG capsule TAKE 1 CAPSULE BY MOUTH 3  TIMES DAILY 270 capsule 1  . lamiVUDine-zidovudine (COMBIVIR) 150-300 MG tablet Take 1 tablet by mouth 2 (two) times daily.    . metoprolol succinate (TOPROL-XL) 100 MG 24 hr tablet Take 100 mg by mouth daily. Take with or immediately following a meal.    . Multiple Vitamin (MULTI-VITAMINS) TABS Take 1 tablet by mouth daily.     . OXYGEN Inhale 2 L into the lungs at bedtime and may repeat dose one time if needed.     . TRELEGY ELLIPTA 100-62.5-25 MCG/INH AEPB Inhale 1 puff into the lungs daily.    [name] lamoTRIgine (LAMICTAL) 100 MG tablet Take 1 tablet by mouth daily.    [name] oxyCODONE (ROXICODONE) 5 MG immediate release tablet Take 1 tablet (5 mg total) by mouth every 8 (eight) hours as needed. 20 tablet 0   No current facility-administered medications for this visit.     PHYSICAL EXAMINATION: ECOG PERFORMANCE STATUS: 0 - Asymptomatic  BP (!) 158/91 (Patient Position: Sitting)   Pulse 62   Temp (!) 97 F (36.1 C) (Tympanic)   Resp (!) 22   Ht 5\' 7"  (1.702 m)   Wt 159 lb 9.6 oz (72.4 kg)   BMI 25.00 kg/m   Filed Weights   11/18/18 1437  Weight: 159 lb 9.6 oz (72.4 kg)    Physical Exam  Constitutional: She is oriented to person, place, and time and well-developed, well-nourished, and in no distress.  Accompanied by family.  Nasal cannula oxygen.  HENT:  Head: Normocephalic and atraumatic.  Mouth/Throat: Oropharynx is clear and moist. No oropharyngeal exudate.  Eyes: Pupils are equal, round, and reactive to light.  Neck: Normal range of motion. Neck supple.  Cardiovascular: Normal rate and regular rhythm.  Pulmonary/Chest: No respiratory distress. She has no wheezes.  Decreased air entry  bilaterally.  Abdominal: Soft. Bowel sounds are normal. She exhibits no distension and no mass. There is no abdominal tenderness. There is no rebound and no guarding.  Musculoskeletal: Normal range of motion.        General: No tenderness or edema.  Neurological: She is alert and oriented to person, place, and time.  Skin: Skin is warm.  Right and left BREAST exam (in the presence of nurse)- no unusual skin changes or dominant masses felt. Surgical scars noted.    Psychiatric: Affect normal.       LABORATORY DATA:  I have reviewed the data as listed    Component Value Date/Time   NA 134 (L) 11/18/2018 1419   NA 133 (L) 07/04/2013  1216   K 4.3 11/18/2018 1419   K 3.5 07/04/2013 1216   CL 96 (L) 11/18/2018 1419   CL 99 07/04/2013 1216   CO2 30 11/18/2018 1419   CO2 30 07/04/2013 1216   GLUCOSE 101 (H) 11/18/2018 1419   GLUCOSE 100 (H) 07/04/2013 1216   BUN 13 11/18/2018 1419   BUN 15 07/04/2013 1216   CREATININE 0.91 11/18/2018 1419   CREATININE 0.83 10/12/2014 1239   CALCIUM 9.5 11/18/2018 1419   CALCIUM 8.8 07/04/2013 1216   PROT 8.4 (H) 11/18/2018 1419   PROT 8.5 (H) 10/12/2014 1239   ALBUMIN 4.0 11/18/2018 1419   ALBUMIN 4.1 10/12/2014 1239   AST 35 11/18/2018 1419   AST 44 (H) 10/12/2014 1239   ALT 19 11/18/2018 1419   ALT 28 10/12/2014 1239   ALKPHOS 97 11/18/2018 1419   ALKPHOS 69 10/12/2014 1239   BILITOT 0.6 11/18/2018 1419   BILITOT 0.6 10/12/2014 1239   GFRNONAA >60 11/18/2018 1419   GFRNONAA >60 10/12/2014 1239   GFRAA >60 11/18/2018 1419   GFRAA >60 10/12/2014 1239    No results found for: SPEP, UPEP  Lab Results  Component Value Date   WBC 7.1 11/18/2018   NEUTROABS 4.9 11/18/2018   HGB 13.7 11/18/2018   HCT 41.3 11/18/2018   MCV 102.2 (H) 11/18/2018   PLT 236 11/18/2018      Chemistry      Component Value Date/Time   NA 134 (L) 11/18/2018 1419   NA 133 (L) 07/04/2013 1216   K 4.3 11/18/2018 1419   K 3.5 07/04/2013 1216   CL 96 (L)  11/18/2018 1419   CL 99 07/04/2013 1216   CO2 30 11/18/2018 1419   CO2 30 07/04/2013 1216   BUN 13 11/18/2018 1419   BUN 15 07/04/2013 1216   CREATININE 0.91 11/18/2018 1419   CREATININE 0.83 10/12/2014 1239      Component Value Date/Time   CALCIUM 9.5 11/18/2018 1419   CALCIUM 8.8 07/04/2013 1216   ALKPHOS 97 11/18/2018 1419   ALKPHOS 69 10/12/2014 1239   AST 35 11/18/2018 1419   AST 44 (H) 10/12/2014 1239   ALT 19 11/18/2018 1419   ALT 28 10/12/2014 1239   BILITOT 0.6 11/18/2018 1419   BILITOT 0.6 10/12/2014 1239         ASSESSMENT & PLAN:  Carcinoma of overlapping sites of right breast in female, estrogen receptor positive ([place]) # RIGHT BREAST CA STAGE II ER/PR- Pos; her 2 NEG; clinically no evidence of recurrence noted; STOPPED  aromatase inhibitor Secondary to intolerance. mammo-dec 2019- NEG. Stable.   # Left upper lobe lung nodule- approximately one centimeter in size.awaiting CT scan this week. Will call with results.   # Left leg cramp- clinically not DVT.   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 Stable.   # anxiety/ depression/ memory issues-stable.   # cirrhosis- on CT-/Macrocytosis noted likely from alcohol.stable.    #  DISPOSITION: will call with results of CT scan # Follow up in 6 months-MD- labs- cbc/cmp/-Dr.B     Cammie Sickle, MD 12/30/2018 7:39 AM

## 2018-11-20 ENCOUNTER — Other Ambulatory Visit: Payer: Self-pay

## 2018-11-20 ENCOUNTER — Ambulatory Visit
Admission: RE | Admit: 2018-11-20 | Discharge: 2018-11-20 | Disposition: A | Discharge status: Home / Self Care | Payer: 59 | Source: Ambulatory Visit | Attending: Internal Medicine | Admitting: Internal Medicine

## 2018-11-20 ENCOUNTER — Emergency Department: Payer: 59

## 2018-11-20 ENCOUNTER — Emergency Department
Admission: EM | Admit: 2018-11-20 | Discharge: 2018-11-20 | Disposition: A | Discharge status: Home / Self Care | Payer: 59 | Attending: Emergency Medicine | Admitting: Emergency Medicine

## 2018-11-20 ENCOUNTER — Encounter: Payer: Self-pay | Admitting: Emergency Medicine

## 2018-11-20 DIAGNOSIS — S6991XA Unspecified injury of right wrist, hand and finger(s), initial encounter: Secondary | ICD-10-CM | POA: Diagnosis present

## 2018-11-20 DIAGNOSIS — Z170 Estrogen receptor positive status [ER+]: Secondary | ICD-10-CM | POA: Insufficient documentation

## 2018-11-20 DIAGNOSIS — S92502A Displaced unspecified fracture of left lesser toe(s), initial encounter for closed fracture: Secondary | ICD-10-CM

## 2018-11-20 DIAGNOSIS — S62231A Other displaced fracture of base of first metacarpal bone, right hand, initial encounter for closed fracture: Secondary | ICD-10-CM | POA: Insufficient documentation

## 2018-11-20 DIAGNOSIS — W19XXXA Unspecified fall, initial encounter: Secondary | ICD-10-CM | POA: Diagnosis not present

## 2018-11-20 DIAGNOSIS — Y9301 Activity, walking, marching and hiking: Secondary | ICD-10-CM | POA: Insufficient documentation

## 2018-11-20 DIAGNOSIS — C50811 Malignant neoplasm of overlapping sites of right female breast: Secondary | ICD-10-CM | POA: Insufficient documentation

## 2018-11-20 DIAGNOSIS — S62310A Displaced fracture of base of second metacarpal bone, right hand, initial encounter for closed fracture: Secondary | ICD-10-CM

## 2018-11-20 DIAGNOSIS — Y998 Other external cause status: Secondary | ICD-10-CM | POA: Insufficient documentation

## 2018-11-20 DIAGNOSIS — S62314A Displaced fracture of base of fourth metacarpal bone, right hand, initial encounter for closed fracture: Secondary | ICD-10-CM | POA: Diagnosis not present

## 2018-11-20 DIAGNOSIS — Y9289 Other specified places as the place of occurrence of the external cause: Secondary | ICD-10-CM | POA: Insufficient documentation

## 2018-11-20 DIAGNOSIS — F17210 Nicotine dependence, cigarettes, uncomplicated: Secondary | ICD-10-CM | POA: Diagnosis not present

## 2018-11-20 DIAGNOSIS — S92332A Displaced fracture of third metatarsal bone, left foot, initial encounter for closed fracture: Secondary | ICD-10-CM | POA: Diagnosis not present

## 2018-11-20 DIAGNOSIS — J449 Chronic obstructive pulmonary disease, unspecified: Secondary | ICD-10-CM | POA: Diagnosis not present

## 2018-11-20 DIAGNOSIS — S92342A Displaced fracture of fourth metatarsal bone, left foot, initial encounter for closed fracture: Secondary | ICD-10-CM

## 2018-11-20 DIAGNOSIS — I10 Essential (primary) hypertension: Secondary | ICD-10-CM | POA: Diagnosis not present

## 2018-11-20 MED ORDER — OXYCODONE HCL 5 MG PO TABS: 5.0000 mg | ORAL_TABLET | Freq: Three times a day (TID) | ORAL | 0 refills | Status: AC | PRN

## 2018-11-20 NOTE — Discharge Instructions (Signed)
Please call and schedule follow-up appointment with orthopedics.  If you are unable to schedule an appointment and you have concerns return to the emergency department.

## 2018-11-20 NOTE — ED Triage Notes (Signed)
[place] and injured right hand 11 days ago.  Never got checked, but it continues to have swelling and painful especially around the thumb area

## 2018-11-21 NOTE — ED Provider Notes (Signed)
[facility] Emergency Department Provider Note ____________________________________________  Time seen: Approximately 7:56 AM  I have reviewed the triage vital signs and the nursing notes.   HISTORY  Chief Complaint Hand Injury    HPI Regina Bernard is a 63 y.o. female who presents to the emergency department for evaluation and treatment of right hand and left foot pain.  Patient states that she fell about 3 weeks ago and has had left foot swelling and pain since that time.  She has been icing and elevating the foot with some decrease in the swelling but the pain continues.  She took a second fall approximately 10 days ago and injured her right hand.  She states that time she stood up too quickly and lost her balance.  She caught herself with an outstretched hand.  She is left-hand dominant.  She has been taking some of her husbands pain medication with limited relief.  Past Medical History:  Diagnosis Date  . Abnormal CT scan, chest   . Anxiety   . Asthma   . Breast cancer ([place])    Breast CA- Right   . Breast cancer ([place]) 06/18/2011   right breast cancer  . COPD (chronic obstructive pulmonary disease) ([place])   . Endometriosis    tx with vaginal hysterectomy  . Fatty liver   . Hepatitis C 1998   [facility] trial treatment in-patient study 2005. HCV 740, 05/20/11.  [name] History of substance abuse ([place])    Heroine, cocaine, marijuana. States all of them. Heavy use 1995 to 2000. Occasional use before and after. None since 2006.   [name] Hot flashes related to aromatase inhibitor therapy   . Hypertension   . Lung mass   . Peripheral neuropathy    in hands and feet    Patient Active Problem List   Diagnosis Date Noted  . COPD exacerbation ([place]) 01/14/2018  . Carcinoma of overlapping sites of right breast in female, estrogen receptor positive ([place]) 04/18/2016  . Severe chronic obstructive pulmonary disease ([place]) 11/16/2013    Past Surgical History:  Procedure  Laterality Date  . BREAST LUMPECTOMY Right 2013  . TUBAL LIGATION  1996  . Upper right leg benign tumor removed    . VAGINAL HYSTERECTOMY  2001    Prior to Admission medications   Medication Sig Start Date End Date Taking? Authorizing Provider  albuterol (PROVENTIL) (2.5 MG/3ML) 0.083% nebulizer solution Take 2.5 mg by nebulization every 6 (six) hours as needed for wheezing.  09/16/14   [provider]  BREO ELLIPTA 100-25 MCG/INH AEPB Inhale 1 puff into the lungs daily. 12/26/17   [provider]  DULoxetine (CYMBALTA) 30 MG capsule Take 1 capsule (30 mg total) by mouth 2 (two) times daily. 09/19/17   Cammie Sickle, MD  gabapentin (NEURONTIN) 300 MG capsule TAKE 1 CAPSULE BY MOUTH 3  TIMES DAILY 11/12/17   Cammie Sickle, MD  lamiVUDine-zidovudine (COMBIVIR) 150-300 MG tablet Take 1 tablet by mouth 2 (two) times daily.    [provider]  lamoTRIgine (LAMICTAL) 100 MG tablet Take 1 tablet by mouth daily. 09/03/18   [provider]  metoprolol succinate (TOPROL-XL) 100 MG 24 hr tablet Take 100 mg by mouth daily. Take with or immediately following a meal.    [provider]  Multiple Vitamin (MULTI-VITAMINS) TABS Take 1 tablet by mouth daily.     [provider]  oxyCODONE (ROXICODONE) 5 MG immediate release tablet Take 1 tablet (5 mg total) by  mouth every 8 (eight) hours as needed. 11/20/18 11/20/19  Angelis Gates, Johnette Abraham B, FNP  OXYGEN Inhale 2 L into the lungs at bedtime and may repeat dose one time if needed.     [provider]  TRELEGY ELLIPTA 100-62.5-25 MCG/INH AEPB Inhale 1 puff into the lungs daily. 11/06/18   [provider]    Allergies Penicillins  Family History  Problem Relation Age of Onset  . Diabetes Maternal Grandmother   . Lung cancer Father   . Lung cancer Mother   . Hypertension Mother   . Heart disease Mother   . Skin cancer Mother   . Breast cancer Neg Hx     Social History Social History    Tobacco Use  . Smoking status: Current Every Day Smoker    Packs/day: 0.25    Years: 44.00    Pack years: 11.00    Types: Cigarettes    Last attempt to quit: 03/21/2016    Years since quitting: 2.6  . Smokeless tobacco: Never Used  . Tobacco comment: last cigeratte use 6 days ago (11/30/15)  Substance Use Topics  . Alcohol use: Yes    Alcohol/week: 0.0 standard drinks  . Drug use: No    Review of Systems Constitutional: Negative for fever. Cardiovascular: Negative for chest pain. Respiratory: Negative for shortness of breath. Musculoskeletal: Positive for right hand pain and left foot pain Skin: Positive for swelling of the right hand and left foot Neurological: Negative for decrease in sensation  ____________________________________________   PHYSICAL EXAM:  VITAL SIGNS: ED Triage Vitals  Enc Vitals Group     BP 11/20/18 1323 (!) 169/91     Pulse Rate 11/20/18 1323 77     Resp 11/20/18 1323 (!) 22     Temp 11/20/18 1323 98.7 F (37.1 C)     Temp Source 11/20/18 1323 Oral     SpO2 11/20/18 1323 93 %     Weight 11/20/18 1321 159 lb 9.8 oz (72.4 kg)     Height 11/20/18 1321 5\' 7"  (1.702 m)     Head Circumference --      Peak Flow --      Pain Score 11/20/18 1321 8     Pain Loc --      Pain Edu? --      Excl. in [place]? --     Constitutional: Alert and oriented. Well appearing and in no acute distress. Eyes: Conjunctivae are clear without discharge or drainage Head: Atraumatic Neck: Supple.  No focal midline tenderness on exam Respiratory: No cough. Respirations are even and unlabored. Musculoskeletal: Focal tenderness over the first through third metacarpals of the right hand with associated swelling and ecchymosis.  Limited range of motion at the MCPs with full range of motion of the PIP and DIP throughout.  Patient unable to form a composite fist secondary to pain.  Left foot with diffuse edema and late stage ecchymosis diffuse over the dorsal aspect.  Midfoot  tenderness on exam. Neurologic: Motor and sensory function of the right hand and left foot is intact. Skin: Ecchymosis and edema is noted over the right hand and left foot.  No open wounds or lesions. Psychiatric: Affect and behavior are appropriate.  ____________________________________________   LABS (all labs ordered are listed, but only abnormal results are displayed)  Labs Reviewed - No data to display ____________________________________________  RADIOLOGY  Right hand shows a mildly displaced first and second proximal metacarpal fractures with no dislocation.  Left foot shows mildly displaced  fractures involving the third and fourth metatarsals as well as the fifth proximal phalanx. ____________________________________________   PROCEDURES  .Splint Application Date/Time: 1/61/0960 8:01 AM Performed by: Gurney Maxin, NT Authorized by: Victorino Dike, FNP   Consent:    Consent obtained:  Verbal   Consent given by:  Patient   Risks discussed:  Pain and swelling Pre-procedure details:    Sensation:  Normal Procedure details:    Laterality:  Right   Splint type:  Thumb spica and volar short arm   Supplies:  Cotton padding, elastic bandage and Ortho-Glass Post-procedure details:    Pain:  Improved   Sensation:  Normal   Patient tolerance of procedure:  Tolerated well, no immediate complications .Splint Application Date/Time: 4/54/0981 8:02 AM Performed by: Gurney Maxin, NT Authorized by: Victorino Dike, FNP   Consent:    Consent obtained:  Verbal   Consent given by:  Patient   Risks discussed:  Numbness and pain Pre-procedure details:    Sensation:  Normal Procedure details:    Laterality:  Left   Location:  Foot   Supplies:  Elastic bandage Post-procedure details:    Pain:  Unchanged   Sensation:  Normal   Patient tolerance of procedure:  Tolerated well, no immediate  complications    ____________________________________________   INITIAL IMPRESSION / ASSESSMENT AND PLAN / ED COURSE  Regina Bernard is a 63 y.o. who presents to the emergency department for treatment and evaluation of right hand and left foot pain related to 2 separate falls.  Right hand placed in OCL as above.  Left foot wrapped with Ace bandage and then cast shoe applied.   Patient instructed to follow-up with orthopedics.  She was also instructed to return to the emergency department for symptoms that change or worsen if unable schedule an appointment with orthopedics or primary care.  Medications - No data to display  Pertinent labs & imaging results that were available during my care of the patient were reviewed by me and considered in my medical decision making (see chart for details).  _________________________________________   FINAL CLINICAL IMPRESSION(S) / ED DIAGNOSES  Final diagnoses:  Closed displaced fracture of base of first metacarpal bone of right hand, unspecified fracture morphology, initial encounter  Closed displaced fracture of base of second metacarpal bone of right hand, initial encounter  Closed displaced fracture of third metatarsal bone of left foot, initial encounter  Closed displaced fracture of fourth metatarsal bone of left foot, initial encounter  Closed fracture of phalanx of left fifth toe, initial encounter    ED Discharge Orders         Ordered    oxyCODONE (ROXICODONE) 5 MG immediate release tablet  Every 8 hours PRN     11/20/18 1553           If controlled substance prescribed during this visit, 12 month history viewed on the [place] prior to issuing an initial prescription for Schedule II or III opiod.   Victorino Dike, FNP 11/21/18 1914    Arta Silence, MD 11/21/18 [phone]

## 2018-11-23 DIAGNOSIS — S62231A Other displaced fracture of base of first metacarpal bone, right hand, initial encounter for closed fracture: Secondary | ICD-10-CM | POA: Diagnosis not present

## 2018-11-23 DIAGNOSIS — M25541 Pain in joints of right hand: Secondary | ICD-10-CM | POA: Diagnosis not present

## 2018-11-23 DIAGNOSIS — S62340A Nondisplaced fracture of base of second metacarpal bone, right hand, initial encounter for closed fracture: Secondary | ICD-10-CM | POA: Diagnosis not present

## 2018-11-23 DIAGNOSIS — M84375D Stress fracture, left foot, subsequent encounter for fracture with routine healing: Secondary | ICD-10-CM | POA: Diagnosis not present

## 2018-12-02 DIAGNOSIS — J449 Chronic obstructive pulmonary disease, unspecified: Secondary | ICD-10-CM | POA: Diagnosis not present

## 2019-02-06 ENCOUNTER — Other Ambulatory Visit: Payer: Self-pay | Admitting: Internal Medicine

## 2019-02-06 DIAGNOSIS — C50811 Malignant neoplasm of overlapping sites of right female breast: Secondary | ICD-10-CM

## 2019-02-06 DIAGNOSIS — Z170 Estrogen receptor positive status [ER+]: Secondary | ICD-10-CM

## 2019-04-28 ENCOUNTER — Telehealth: Payer: Self-pay

## 2019-04-28 NOTE — Telephone Encounter (Signed)
Discussed colonoscopy referral with patient.  This is her first colonoscopy.  She is going on vacation soon, and this is her first colonoscopy.  I informed her that I will mail an instruction packet out to her to read over and she can call me back to schedule her colonoscopy at a later date.   Thanks [employer]

## 2019-05-11 ENCOUNTER — Encounter: Payer: Self-pay | Admitting: *Deleted

## 2019-05-20 ENCOUNTER — Telehealth: Payer: Self-pay

## 2019-05-20 ENCOUNTER — Other Ambulatory Visit: Payer: Self-pay

## 2019-05-20 NOTE — Telephone Encounter (Signed)
-----   Message from Vanetta Mulders, [place] sent at 05/05/2019  3:36 PM EDT ----- Regarding: Colonoscopy Call Back To Schedule Patient would like to be called back around November 9th to schedule her colonoscopy after vacation time.  Thanks [employer]

## 2019-05-20 NOTE — Telephone Encounter (Signed)
Contacted patient to see if she would like to be scheduled for her colonoscopy.  LVM for her to call me back.  Thanks [employer]

## 2019-05-21 ENCOUNTER — Inpatient Hospital Stay: Payer: 59 | Admitting: Internal Medicine

## 2019-05-21 ENCOUNTER — Inpatient Hospital Stay: Payer: 59

## 2019-05-24 ENCOUNTER — Telehealth: Payer: Self-pay | Admitting: *Deleted

## 2019-05-24 NOTE — Telephone Encounter (Signed)
-----   Message from [place] sent at 05/21/2019  3:47 PM EST ----- Regarding: RE: Appt Cancellation Request Thanks so much ----- Message ----- From: Bryson Corona D Sent: 05/21/2019   1:19 PM EST To: Re[place] Subject: Appt Cancellation Request                      Hello,  I received an answering service message from this patient today at 12:26 pm requesting to cancel appt and states that she will call back to reschedule. Thanks.

## 2019-05-24 NOTE — Telephone Encounter (Signed)
Pt cnl her apts on 11/13. She has not called back to r/s her apts as of 05/24/2019

## 2019-06-09 ENCOUNTER — Telehealth: Payer: Self-pay | Admitting: Gastroenterology

## 2019-06-09 ENCOUNTER — Other Ambulatory Visit: Payer: Self-pay | Admitting: Specialist

## 2019-06-09 ENCOUNTER — Other Ambulatory Visit: Payer: Self-pay | Admitting: Internal Medicine

## 2019-06-09 DIAGNOSIS — R918 Other nonspecific abnormal finding of lung field: Secondary | ICD-10-CM

## 2019-06-09 DIAGNOSIS — R911 Solitary pulmonary nodule: Secondary | ICD-10-CM

## 2019-06-09 DIAGNOSIS — Z1231 Encounter for screening mammogram for malignant neoplasm of breast: Secondary | ICD-10-CM

## 2019-06-09 NOTE — Telephone Encounter (Signed)
Patient called to schedule colonoscopy.

## 2019-06-10 NOTE — Telephone Encounter (Signed)
Returned patients call.  LVM for her to call office back in regards to scheduling her colonoscopy.  Note:  Re-open referral.  Thanks Sharyn Lull

## 2019-06-11 ENCOUNTER — Telehealth: Payer: Self-pay | Admitting: Gastroenterology

## 2019-06-11 NOTE — Telephone Encounter (Signed)
Patient returning call to schedule colonoscopy. ?

## 2019-06-14 ENCOUNTER — Other Ambulatory Visit: Payer: Self-pay

## 2019-06-14 DIAGNOSIS — Z1211 Encounter for screening for malignant neoplasm of colon: Secondary | ICD-10-CM

## 2019-06-14 NOTE — Telephone Encounter (Signed)
Gastroenterology Pre-Procedure Review  Request Date: 06/25/19 Requesting Physician: Dr. Allen Norris  PATIENT REVIEW QUESTIONS: The patient responded to the following health history questions as indicated:    1. Are you having any GI issues? no 2. Do you have a personal history of Polyps? no 3. Do you have a family history of Colon Cancer or Polyps? no 4. Diabetes Mellitus? no 5. Joint replacements in the past 12 months?no 6. Major health problems in the past 3 months?no 7. Any artificial heart valves, MVP, or defibrillator?no    MEDICATIONS & ALLERGIES:    Patient reports the following regarding taking any anticoagulation/antiplatelet therapy:   Plavix, Coumadin, Eliquis, Xarelto, Lovenox, Pradaxa, Brilinta, or Effient? no Aspirin? no  Patient confirms/reports the following medications:  Current Outpatient Medications  Medication Sig Dispense Refill  . albuterol (PROVENTIL) (2.5 MG/3ML) 0.083% nebulizer solution Take 2.5 mg by nebulization every 6 (six) hours as needed for wheezing.     [name] BREO ELLIPTA 100-25 MCG/INH AEPB Inhale 1 puff into the lungs daily.    . DULoxetine (CYMBALTA) 30 MG capsule Take 1 capsule (30 mg total) by mouth 2 (two) times daily. 180 capsule 3  . gabapentin (NEURONTIN) 300 MG capsule TAKE 1 CAPSULE BY MOUTH 3  TIMES DAILY 270 capsule 3  . lamiVUDine-zidovudine (COMBIVIR) 150-300 MG tablet Take 1 tablet by mouth 2 (two) times daily.    [name] lamoTRIgine (LAMICTAL) 100 MG tablet Take 1 tablet by mouth daily.    . metoprolol succinate (TOPROL-XL) 100 MG 24 hr tablet Take 100 mg by mouth daily. Take with or immediately following a meal.    . Multiple Vitamin (MULTI-VITAMINS) TABS Take 1 tablet by mouth daily.     [name] oxyCODONE (ROXICODONE) 5 MG immediate release tablet Take 1 tablet (5 mg total) by mouth every 8 (eight) hours as needed. 20 tablet 0  . OXYGEN Inhale 2 L into the lungs at bedtime and may repeat dose one time if needed.     . TRELEGY ELLIPTA 100-62.5-25 MCG/INH  AEPB Inhale 1 puff into the lungs daily.     No current facility-administered medications for this visit.     Patient confirms/reports the following allergies:  Allergies  Allergen Reactions  . Penicillins Swelling and Rash    Has patient had a PCN reaction causing immediate rash, facial/tongue/throat swelling, SOB or lightheadedness with hypotension: Yes Has patient had a PCN reaction causing severe rash involving mucus membranes or skin necrosis: No Has patient had a PCN reaction that required hospitalization: No Has patient had a PCN reaction occurring within the last 10 years: No If all of the above answers are "NO", then may proceed with Cephalosporin use.     No orders of the defined types were placed in this encounter.   AUTHORIZATION INFORMATION Primary Insurance: 1D#: Group #:  Secondary Insurance: 1D#: Group #:  SCHEDULE INFORMATION: Date: 06/25/19 Time: Location:[facility]

## 2019-06-14 NOTE — Telephone Encounter (Signed)
PT left vm she has been trying to get in touch with Sharyn Lull  To schedule a colonoscopy

## 2019-06-16 ENCOUNTER — Other Ambulatory Visit: Payer: Self-pay

## 2019-06-16 ENCOUNTER — Ambulatory Visit
Admission: RE | Admit: 2019-06-16 | Discharge: 2019-06-16 | Disposition: A | Discharge status: Home / Self Care | Payer: 59 | Source: Ambulatory Visit | Attending: Specialist | Admitting: Specialist

## 2019-06-16 ENCOUNTER — Encounter (INDEPENDENT_AMBULATORY_CARE_PROVIDER_SITE_OTHER): Payer: Self-pay

## 2019-06-16 DIAGNOSIS — R911 Solitary pulmonary nodule: Secondary | ICD-10-CM | POA: Diagnosis present

## 2019-06-21 ENCOUNTER — Ambulatory Visit
Admission: RE | Admit: 2019-06-21 | Discharge: 2019-06-21 | Disposition: A | Discharge status: Home / Self Care | Payer: 59 | Source: Ambulatory Visit | Attending: Internal Medicine | Admitting: Internal Medicine

## 2019-06-21 ENCOUNTER — Other Ambulatory Visit: Payer: Self-pay

## 2019-06-21 ENCOUNTER — Encounter: Payer: Self-pay | Admitting: Internal Medicine

## 2019-06-21 DIAGNOSIS — Z1231 Encounter for screening mammogram for malignant neoplasm of breast: Secondary | ICD-10-CM | POA: Diagnosis present

## 2019-06-21 NOTE — Progress Notes (Signed)
Patient is here for follow-up with no questions or concerns.

## 2019-06-22 ENCOUNTER — Inpatient Hospital Stay (HOSPITAL_BASED_OUTPATIENT_CLINIC_OR_DEPARTMENT_OTHER): Payer: 59 | Admitting: Internal Medicine

## 2019-06-22 ENCOUNTER — Telehealth: Payer: Self-pay | Admitting: Gastroenterology

## 2019-06-22 ENCOUNTER — Other Ambulatory Visit: Payer: Self-pay

## 2019-06-22 ENCOUNTER — Other Ambulatory Visit
Admission: RE | Admit: 2019-06-22 | Discharge: 2019-06-22 | Disposition: A | Discharge status: Home / Self Care | Payer: 59 | Source: Ambulatory Visit | Attending: Gastroenterology | Admitting: Gastroenterology

## 2019-06-22 ENCOUNTER — Inpatient Hospital Stay: Payer: 59 | Attending: Internal Medicine

## 2019-06-22 VITALS — BP 122/75 | HR 66 | Temp 97.2°F | Wt 170.0 lb

## 2019-06-22 DIAGNOSIS — Z7951 Long term (current) use of inhaled steroids: Secondary | ICD-10-CM | POA: Insufficient documentation

## 2019-06-22 DIAGNOSIS — Z20828 Contact with and (suspected) exposure to other viral communicable diseases: Secondary | ICD-10-CM | POA: Insufficient documentation

## 2019-06-22 DIAGNOSIS — Z801 Family history of malignant neoplasm of trachea, bronchus and lung: Secondary | ICD-10-CM | POA: Diagnosis not present

## 2019-06-22 DIAGNOSIS — C50811 Malignant neoplasm of overlapping sites of right female breast: Secondary | ICD-10-CM

## 2019-06-22 DIAGNOSIS — F329 Major depressive disorder, single episode, unspecified: Secondary | ICD-10-CM | POA: Diagnosis not present

## 2019-06-22 DIAGNOSIS — I10 Essential (primary) hypertension: Secondary | ICD-10-CM | POA: Diagnosis not present

## 2019-06-22 DIAGNOSIS — Z170 Estrogen receptor positive status [ER+]: Secondary | ICD-10-CM

## 2019-06-22 DIAGNOSIS — Z8249 Family history of ischemic heart disease and other diseases of the circulatory system: Secondary | ICD-10-CM | POA: Insufficient documentation

## 2019-06-22 DIAGNOSIS — F419 Anxiety disorder, unspecified: Secondary | ICD-10-CM | POA: Insufficient documentation

## 2019-06-22 DIAGNOSIS — R911 Solitary pulmonary nodule: Secondary | ICD-10-CM | POA: Diagnosis not present

## 2019-06-22 DIAGNOSIS — K7460 Unspecified cirrhosis of liver: Secondary | ICD-10-CM | POA: Diagnosis not present

## 2019-06-22 DIAGNOSIS — D7589 Other specified diseases of blood and blood-forming organs: Secondary | ICD-10-CM | POA: Insufficient documentation

## 2019-06-22 DIAGNOSIS — Z01812 Encounter for preprocedural laboratory examination: Secondary | ICD-10-CM | POA: Insufficient documentation

## 2019-06-22 DIAGNOSIS — Z9981 Dependence on supplemental oxygen: Secondary | ICD-10-CM | POA: Insufficient documentation

## 2019-06-22 DIAGNOSIS — F17210 Nicotine dependence, cigarettes, uncomplicated: Secondary | ICD-10-CM | POA: Diagnosis not present

## 2019-06-22 DIAGNOSIS — Z79899 Other long term (current) drug therapy: Secondary | ICD-10-CM | POA: Insufficient documentation

## 2019-06-22 DIAGNOSIS — J449 Chronic obstructive pulmonary disease, unspecified: Secondary | ICD-10-CM | POA: Diagnosis not present

## 2019-06-22 LAB — COMPREHENSIVE METABOLIC PANEL
ALT: 20 U/L (ref 0–44)
AST: 30 U/L (ref 15–41)
Albumin: 4 g/dL (ref 3.5–5.0)
Alkaline Phosphatase: 87 U/L (ref 38–126)
Anion gap: 10 (ref 5–15)
BUN: 12 mg/dL (ref 8–23)
CO2: 29 mmol/L (ref 22–32)
Calcium: 9.5 mg/dL (ref 8.9–10.3)
Chloride: 93 mmol/L — ABNORMAL LOW (ref 98–111)
Creatinine, Ser: 0.9 mg/dL (ref 0.44–1.00)
GFR calc Af Amer: >60 mL/min (ref >60)
GFR calc non Af Amer: >60 mL/min (ref >60)
Glucose, Bld: 111 mg/dL — ABNORMAL HIGH (ref 70–99)
Potassium: 4.6 mmol/L (ref 3.5–5.1)
Sodium: 132 mmol/L — ABNORMAL LOW (ref 135–145)
Total Bilirubin: 0.8 mg/dL (ref 0.3–1.2)
Total Protein: 7.8 g/dL (ref 6.5–8.1)

## 2019-06-22 LAB — CBC WITH DIFFERENTIAL/PLATELET
Abs Immature Granulocytes: 0.02 10*3/uL (ref 0.00–0.07)
Basophils Absolute: 0 10*3/uL (ref 0.0–0.1)
Basophils Relative: 0 %
Eosinophils Absolute: 0.3 10*3/uL (ref 0.0–0.5)
Eosinophils Relative: 3 %
HCT: 40.6 % (ref 36.0–46.0)
Hemoglobin: 13.3 g/dL (ref 12.0–15.0)
Immature Granulocytes: 0 %
Lymphocytes Relative: 33 %
Lymphs Abs: 2.8 10*3/uL (ref 0.7–4.0)
MCH: 33.7 pg (ref 26.0–34.0)
MCHC: 32.8 g/dL (ref 30.0–36.0)
MCV: 102.8 fL — ABNORMAL HIGH (ref 80.0–100.0)
Monocytes Absolute: 0.8 10*3/uL (ref 0.1–1.0)
Monocytes Relative: 9 %
Neutro Abs: 4.5 10*3/uL (ref 1.7–7.7)
Neutrophils Relative %: 55 %
Platelets: 195 10*3/uL (ref 150–400)
RBC: 3.95 MIL/uL (ref 3.87–5.11)
RDW: 12.2 % (ref 11.5–15.5)
WBC: 8.4 10*3/uL (ref 4.0–10.5)
nRBC: 0 % (ref 0.0–0.2)

## 2019-06-22 NOTE — Telephone Encounter (Signed)
Patients call has been returned.  She's been asked to go right now for her COVID test.  She's going to try to get there before 3pm.  I lvm with [facility] to let them know she is coming now.  Thanks [employer]

## 2019-06-22 NOTE — Telephone Encounter (Signed)
Pt left vm for Sharyn Lull she needs a call regarding her Covid test tomorrow she states she had it down for today please call pt

## 2019-06-22 NOTE — Assessment & Plan Note (Addendum)
#   RIGHT BREAST CA STAGE II ER/PR- Pos; her 2 NEG; clinically no evidence of recurrence noted; STOPPED  aromatase inhibitor Secondary to intolerance. mammo-dec 2020- NEG.STABLE.   # Left upper lobe lung nodule-~ 1.5cm; stable [previously 1.4cm] followed by pulmonary; defer to pulmonary for follow up.   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 Stable.   # Anxiety/ depression/ memory issues- STABLE.  # Cirrhosis- on CT-/Macrocytosis noted likely from alcohol. STABLE; check AFP; [place] in 6 months.   #  DISPOSITION # Follow up in 6 months-MD- labs- cbc/cmp/AFP; US liver prir- -Dr.B  Cc; Dr.Fleming

## 2019-06-22 NOTE — Telephone Encounter (Signed)
Patient called & wanted to know when she needs to go for her Covid- test? 06-22-19 or 06-23-19 procedure on Friday. Please call.

## 2019-06-22 NOTE — Progress Notes (Signed)
[facility] OFFICE PROGRESS NOTE  Patient Care Team: Juluis Pitch, MD as PCP - General (Family Medicine)   Oncology History Overview Note  # RIGHT BREAST STAGE II [pT1psN1; neg 15LN s/p ALND] s/p Lumpec [Dr.Smith] s/p RT; AC-taxol; [name]; April 2016];STOPPED AI on Jan 2017.  April 2016- Mammo NEG  # LUL lung nodule- [poor surgical candidate]  # PN- G-2 on Neurontin  # Hot flashes- sec to AI. COPD- on 2 Lit [place] [Dr.Fleming]   Carcinoma of overlapping sites of right breast in female, estrogen receptor positive ([place])     INTERVAL HISTORY:  63 year old female patient with a history of stage II breast cancer COPD on home O2 is here for follow-up.  Patient is not on aromatase inhibitor because of intolerance/side effects.  Patient states that she has awaiting colonoscopy with Dr. Allen Norris soon.  States her anxiety and depression-getting worse.  Continues to chronic shortness of breath chronic cough.  Had a recent CAT scan for follow-up of lung nodule. Follows up with Dr. Raul Del.    Review of Systems  Constitutional: Positive for malaise/fatigue. Negative for chills, diaphoresis, fever and weight loss.  HENT: Negative for nosebleeds and sore throat.   Eyes: Negative for double vision.  Respiratory: Positive for cough and shortness of breath. Negative for hemoptysis, sputum production and wheezing.   Cardiovascular: Negative for chest pain, palpitations, orthopnea and leg swelling.  Gastrointestinal: Negative for abdominal pain, blood in stool, constipation, diarrhea, heartburn, melena, nausea and vomiting.  Genitourinary: Negative for dysuria, frequency and urgency.  Musculoskeletal: Negative for back pain and joint pain.  Skin: Negative.  Negative for itching and rash.  Neurological: Positive for tingling. Negative for dizziness, focal weakness, weakness and headaches.  Endo/Heme/Allergies: Does not bruise/bleed easily.  Psychiatric/Behavioral: Negative for  depression. The patient is nervous/anxious. The patient does not have insomnia.      PAST MEDICAL HISTORY :  Past Medical History:  Diagnosis Date  . Abnormal CT scan, chest   . Anxiety   . Asthma   . Breast cancer ([place])    Breast CA- Right   . Breast cancer ([place]) 06/18/2011   right breast cancer  . COPD (chronic obstructive pulmonary disease) ([place])   . Endometriosis    tx with vaginal hysterectomy  . Fatty liver   . Hepatitis C 1998   [facility] trial treatment in-patient study 2005. HCV 740, 05/20/11.  [name] History of substance abuse ([place])    Heroine, cocaine, marijuana. States all of them. Heavy use 1995 to 2000. Occasional use before and after. None since 2006.   [name] Hot flashes related to aromatase inhibitor therapy   . Hypertension   . Lung mass   . Peripheral neuropathy    in hands and feet    PAST SURGICAL HISTORY :   Past Surgical History:  Procedure Laterality Date  . BREAST LUMPECTOMY Right 2013  . TUBAL LIGATION  1996  . Upper right leg benign tumor removed    . VAGINAL HYSTERECTOMY  2001    FAMILY HISTORY :   Family History  Problem Relation Age of Onset  . Diabetes Maternal Grandmother   . Lung cancer Father   . Lung cancer Mother   . Hypertension Mother   . Heart disease Mother   . Skin cancer Mother   . Breast cancer Neg Hx     SOCIAL HISTORY:   Social History   Tobacco Use  . Smoking status: Current Every Day Smoker    Packs/day:  0.25    Years: 44.00    Pack years: 11.00    Types: Cigarettes    Last attempt to quit: 03/21/2016    Years since quitting: 3.2  . Smokeless tobacco: Never Used  . Tobacco comment: last cigeratte use 6 days ago (11/30/15)  Substance Use Topics  . Alcohol use: Yes    Alcohol/week: 0.0 standard drinks  . Drug use: No    ALLERGIES:  is allergic to penicillins.  MEDICATIONS:  Current Outpatient Medications  Medication Sig Dispense Refill  . albuterol (PROVENTIL) (2.5 MG/3ML) 0.083% nebulizer solution Take 2.5 mg by  nebulization every 6 (six) hours as needed for wheezing.     . DULoxetine (CYMBALTA) 30 MG capsule Take 1 capsule (30 mg total) by mouth 2 (two) times daily. 180 capsule 3  . gabapentin (NEURONTIN) 300 MG capsule TAKE 1 CAPSULE BY MOUTH 3  TIMES DAILY 270 capsule 3  . lamiVUDine-zidovudine (COMBIVIR) 150-300 MG tablet Take 1 tablet by mouth 2 (two) times daily.    [name] lamoTRIgine (LAMICTAL) 100 MG tablet Take 1 tablet by mouth daily.    . metoprolol succinate (TOPROL-XL) 100 MG 24 hr tablet Take 100 mg by mouth daily. Take with or immediately following a meal.    . Multiple Vitamin (MULTI-VITAMINS) TABS Take 1 tablet by mouth daily.     . OXYGEN Inhale 2 L into the lungs at bedtime and may repeat dose one time if needed.     . TRELEGY ELLIPTA 100-62.5-25 MCG/INH AEPB Inhale 1 puff into the lungs daily.    [name] BREO ELLIPTA 100-25 MCG/INH AEPB Inhale 1 puff into the lungs daily.    [name] oxyCODONE (ROXICODONE) 5 MG immediate release tablet Take 1 tablet (5 mg total) by mouth every 8 (eight) hours as needed. (Patient not taking: Reported on 06/21/2019) 20 tablet 0   No current facility-administered medications for this visit.    PHYSICAL EXAMINATION: ECOG PERFORMANCE STATUS: 0 - Asymptomatic  BP 122/75 (BP Location: Left Arm, Patient Position: Sitting)   Pulse 66   Temp (!) 97.2 F (36.2 C) (Tympanic)   Wt 170 lb (77.1 kg)   SpO2 95%   BMI 26.63 kg/m   Filed Weights   06/22/19 1044  Weight: 170 lb (77.1 kg)    Physical Exam  Constitutional: She is oriented to person, place, and time and well-developed, well-nourished, and in no distress.  Accompanied by family.  Nasal cannula oxygen.  HENT:  Head: Normocephalic and atraumatic.  Mouth/Throat: Oropharynx is clear and moist. No oropharyngeal exudate.  Eyes: Pupils are equal, round, and reactive to light.  Cardiovascular: Normal rate and regular rhythm.  Pulmonary/Chest: No respiratory distress. She has no wheezes.  Decreased air entry  bilaterally.  Abdominal: Soft. Bowel sounds are normal. She exhibits no distension and no mass. There is no abdominal tenderness. There is no rebound and no guarding.  Musculoskeletal:        General: No tenderness or edema. Normal range of motion.     Cervical back: Normal range of motion and neck supple.  Neurological: She is alert and oriented to person, place, and time.  Skin: Skin is warm.  Psychiatric: Affect normal.       LABORATORY DATA:  I have reviewed the data as listed    Component Value Date/Time   NA 132 (L) 06/22/2019 1015   NA 133 (L) 07/04/2013 1216   K 4.6 06/22/2019 1015   K 3.5 07/04/2013 1216   CL 93 (L) 06/22/2019  1015   CL 99 07/04/2013 1216   CO2 29 06/22/2019 1015   CO2 30 07/04/2013 1216   GLUCOSE 111 (H) 06/22/2019 1015   GLUCOSE 100 (H) 07/04/2013 1216   BUN 12 06/22/2019 1015   BUN 15 07/04/2013 1216   CREATININE 0.90 06/22/2019 1015   CREATININE 0.83 10/12/2014 1239   CALCIUM 9.5 06/22/2019 1015   CALCIUM 8.8 07/04/2013 1216   PROT 7.8 06/22/2019 1015   PROT 8.5 (H) 10/12/2014 1239   ALBUMIN 4.0 06/22/2019 1015   ALBUMIN 4.1 10/12/2014 1239   AST 30 06/22/2019 1015   AST 44 (H) 10/12/2014 1239   ALT 20 06/22/2019 1015   ALT 28 10/12/2014 1239   ALKPHOS 87 06/22/2019 1015   ALKPHOS 69 10/12/2014 1239   BILITOT 0.8 06/22/2019 1015   BILITOT 0.6 10/12/2014 1239   GFRNONAA >60 06/22/2019 1015   GFRNONAA >60 10/12/2014 1239   GFRAA >60 06/22/2019 1015   GFRAA >60 10/12/2014 1239    No results found for: SPEP, UPEP  Lab Results  Component Value Date   WBC 8.4 06/22/2019   NEUTROABS 4.5 06/22/2019   HGB 13.3 06/22/2019   HCT 40.6 06/22/2019   MCV 102.8 (H) 06/22/2019   PLT 195 06/22/2019      Chemistry      Component Value Date/Time   NA 132 (L) 06/22/2019 1015   NA 133 (L) 07/04/2013 1216   K 4.6 06/22/2019 1015   K 3.5 07/04/2013 1216   CL 93 (L) 06/22/2019 1015   CL 99 07/04/2013 1216   CO2 29 06/22/2019 1015   CO2 30  07/04/2013 1216   BUN 12 06/22/2019 1015   BUN 15 07/04/2013 1216   CREATININE 0.90 06/22/2019 1015   CREATININE 0.83 10/12/2014 1239      Component Value Date/Time   CALCIUM 9.5 06/22/2019 1015   CALCIUM 8.8 07/04/2013 1216   ALKPHOS 87 06/22/2019 1015   ALKPHOS 69 10/12/2014 1239   AST 30 06/22/2019 1015   AST 44 (H) 10/12/2014 1239   ALT 20 06/22/2019 1015   ALT 28 10/12/2014 1239   BILITOT 0.8 06/22/2019 1015   BILITOT 0.6 10/12/2014 1239         ASSESSMENT & PLAN:  Carcinoma of overlapping sites of right breast in female, estrogen receptor positive ([place]) # RIGHT BREAST CA STAGE II ER/PR- Pos; her 2 NEG; clinically no evidence of recurrence noted; STOPPED  aromatase inhibitor Secondary to intolerance. mammo-dec 2020- NEG.STABLE.   # Left upper lobe lung nodule-~ 1.5cm; stable [previously 1.4cm] followed by pulmonary; defer to pulmonary for follow up.   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 Stable.   # Anxiety/ depression/ memory issues- STABLE.  # Cirrhosis- on CT-/Macrocytosis noted likely from alcohol. STABLE; check AFP; [place] in 6 months.   #  DISPOSITION # Follow up in 6 months-MD- labs- cbc/cmp/AFP; US liver prir- -Dr.B  Cc; Dr.Fleming     Cammie Sickle, MD 06/22/2019 12:36 PM

## 2019-06-23 LAB — SARS CORONAVIRUS 2 (TAT 6-24 HRS): SARS Coronavirus 2: NEGATIVE

## 2019-06-24 ENCOUNTER — Encounter: Payer: Self-pay | Admitting: Registered Nurse

## 2019-06-25 ENCOUNTER — Ambulatory Visit
Admission: RE | Admit: 2019-06-25 | Discharge: 2019-06-25 | Disposition: A | Discharge status: Home / Self Care | Payer: 59 | Attending: Gastroenterology | Admitting: Gastroenterology

## 2019-06-25 ENCOUNTER — Encounter: Payer: Self-pay | Admitting: Gastroenterology

## 2019-06-25 ENCOUNTER — Encounter
Admission: RE | Disposition: A | Discharge status: Home / Self Care | Payer: Self-pay | Source: Home / Self Care | Attending: Gastroenterology

## 2019-06-25 DIAGNOSIS — Z1211 Encounter for screening for malignant neoplasm of colon: Secondary | ICD-10-CM | POA: Insufficient documentation

## 2019-06-25 DIAGNOSIS — Z5309 Procedure and treatment not carried out because of other contraindication: Secondary | ICD-10-CM | POA: Diagnosis not present

## 2019-06-25 LAB — URINE DRUG SCREEN, QUALITATIVE (ARMC ONLY)
Amphetamines, Ur Screen: NOT DETECTED
Barbiturates, Ur Screen: NOT DETECTED
Benzodiazepine, Ur Scrn: POSITIVE — AB
Cannabinoid 50 Ng, Ur ~~LOC~~: NOT DETECTED
Cocaine Metabolite,Ur ~~LOC~~: POSITIVE — AB
MDMA (Ecstasy)Ur Screen: NOT DETECTED
Methadone Scn, Ur: NOT DETECTED
Opiate, Ur Screen: NOT DETECTED
Phencyclidine (PCP) Ur S: NOT DETECTED
Tricyclic, Ur Screen: NOT DETECTED

## 2019-06-25 SURGERY — COLONOSCOPY WITH PROPOFOL
Anesthesia: General

## 2019-06-25 MED ORDER — SODIUM CHLORIDE 0.9 % IV SOLN: INTRAVENOUS | Status: DC

## 2019-07-18 IMAGING — MG DIGITAL SCREENING BILATERAL MAMMOGRAM WITH TOMO AND CAD
6 of 10 series · 6 of 30 positions shown · non-contrast
Comparison: Previous exam(s).

CLINICAL DATA: Screening.

EXAM:
DIGITAL SCREENING BILATERAL MAMMOGRAM WITH TOMO AND CAD

[R MLO synth-2D (1 of 2)]
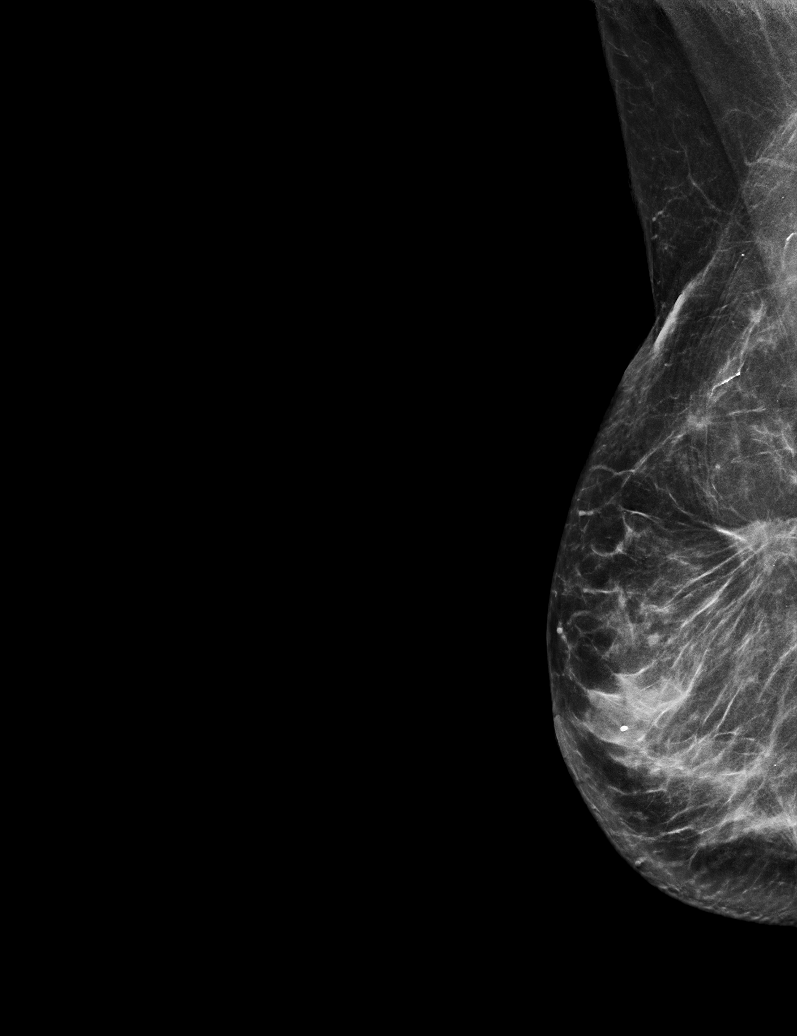

[L CC synth-2D]
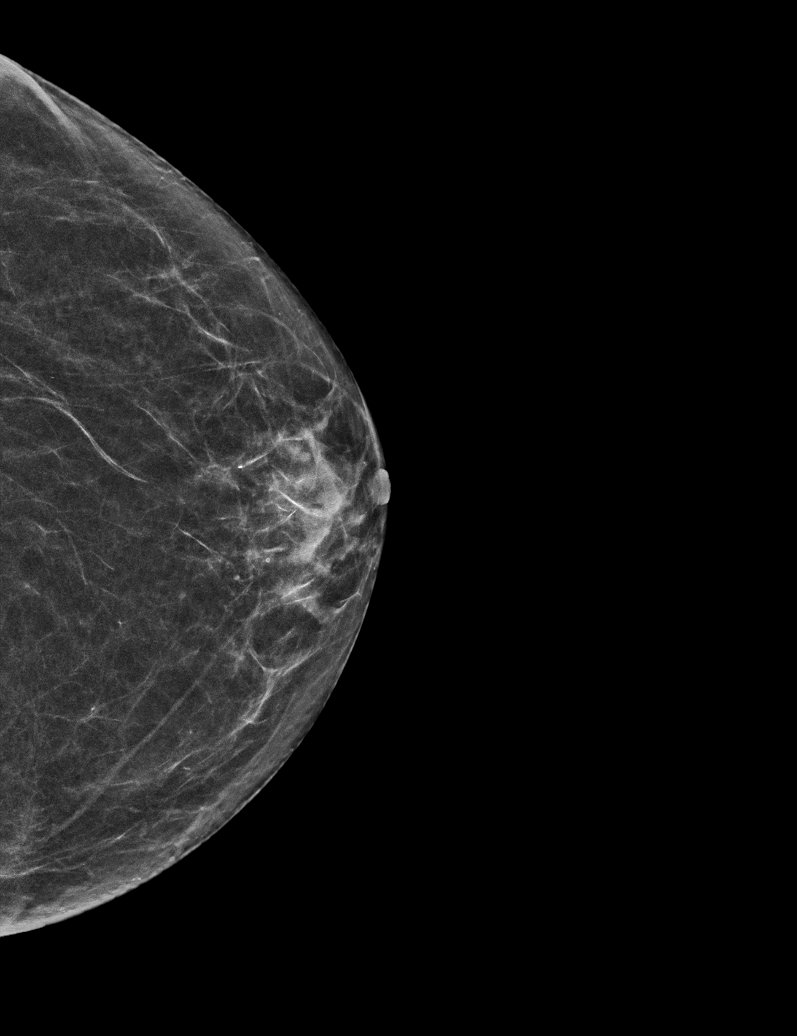

[R MLO synth-2D (2 of 2)]
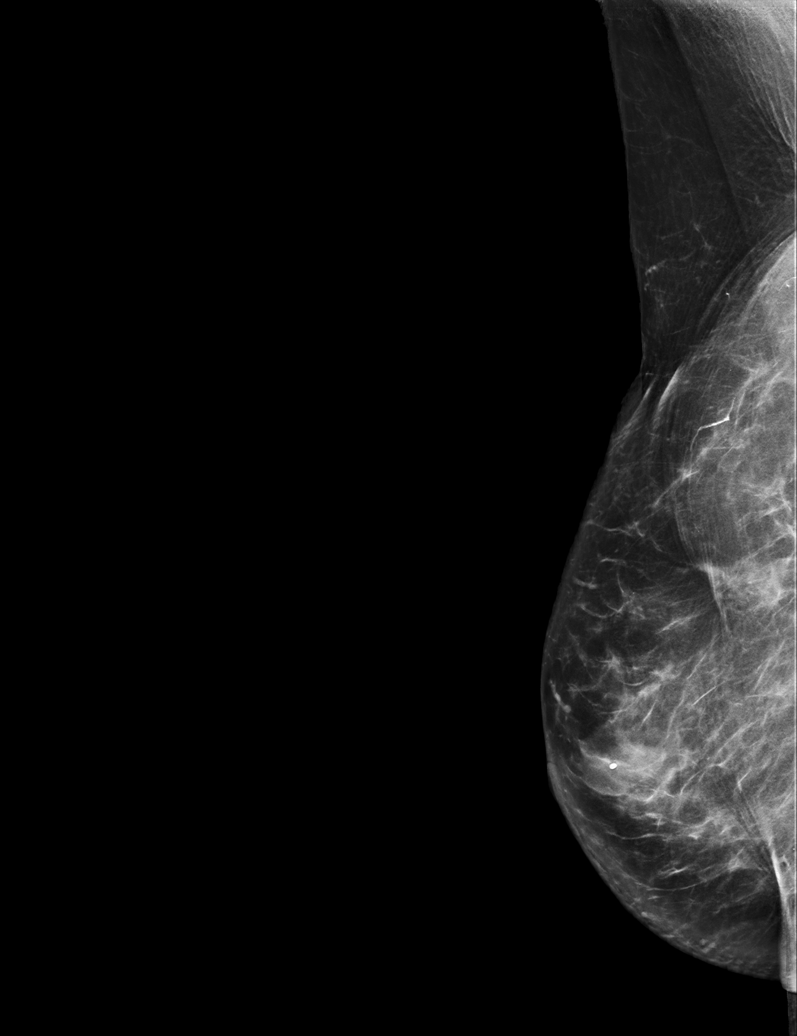

[L MLO synth-2D]
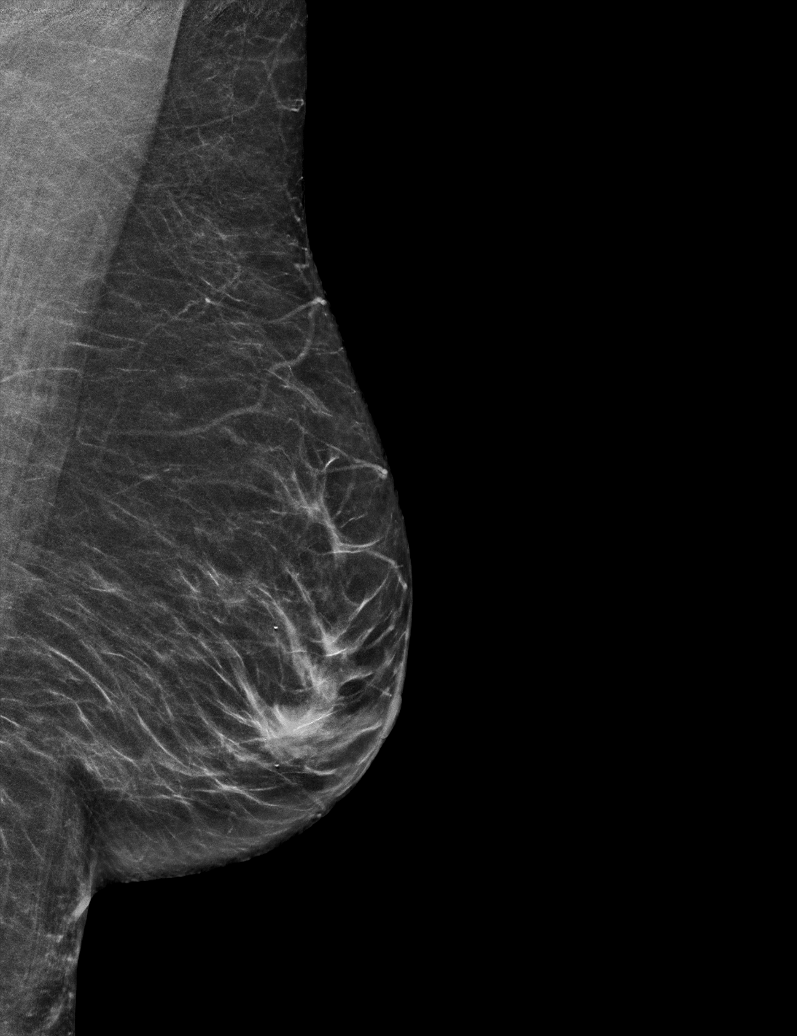

[R CC synth-2D]
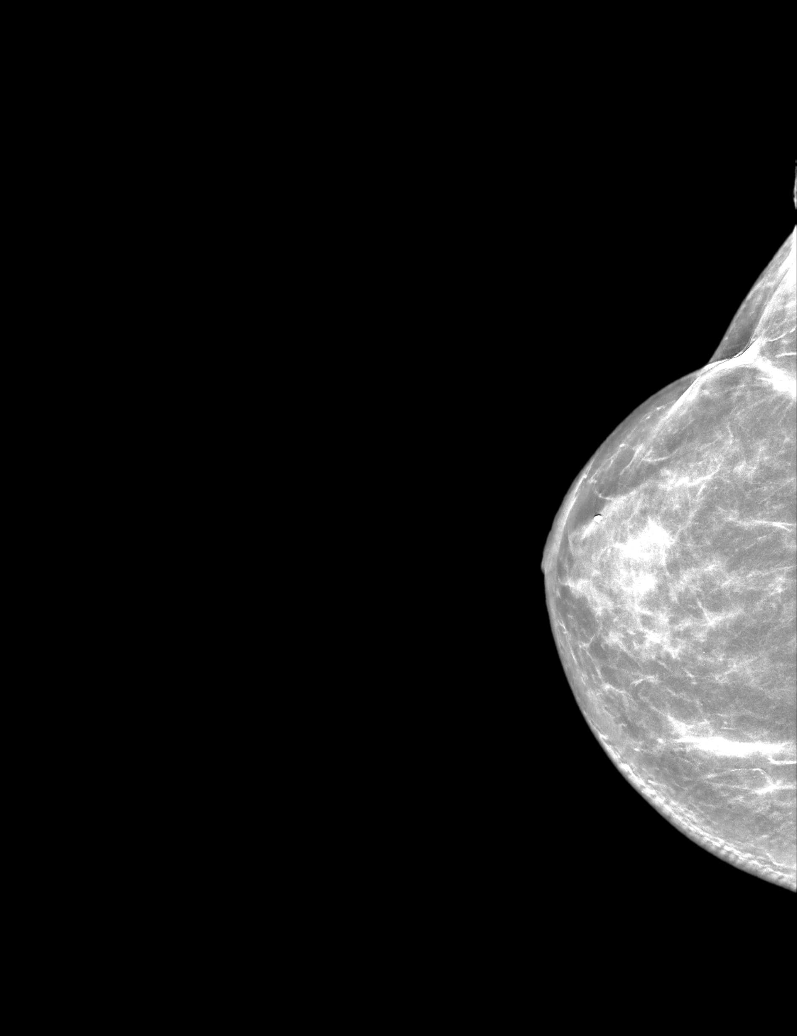

[L CC tomo · tomo slice 25/49.0]
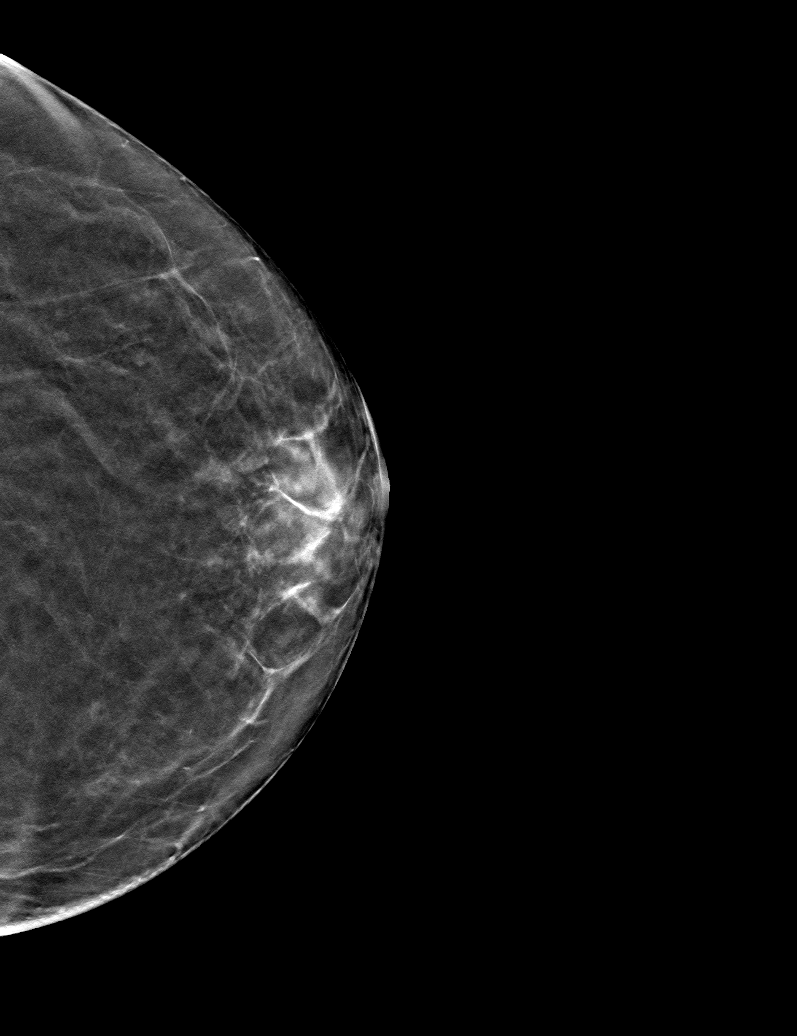

[6 of 30 positions shown; findings below may reference images not displayed]

ACR Breast Density Category b: There are scattered areas of
fibroglandular density.
FINDINGS: There are no findings suspicious for malignancy. Images were
processed with CAD.
IMPRESSION: No mammographic evidence of malignancy. A result letter of this
screening mammogram will be mailed directly to the patient.

RECOMMENDATION:
Screening mammogram in one year. (Code:[id number])

BI-RADS CATEGORY  1: Negative.

## 2019-12-16 ENCOUNTER — Ambulatory Visit
Admission: RE | Admit: 2019-12-16 | Discharge: 2019-12-16 | Disposition: A | Discharge status: Home / Self Care | Payer: 59 | Source: Ambulatory Visit | Attending: Internal Medicine | Admitting: Internal Medicine

## 2019-12-16 ENCOUNTER — Other Ambulatory Visit: Payer: Self-pay

## 2019-12-16 DIAGNOSIS — K7460 Unspecified cirrhosis of liver: Secondary | ICD-10-CM | POA: Diagnosis present

## 2019-12-16 DIAGNOSIS — C50811 Malignant neoplasm of overlapping sites of right female breast: Secondary | ICD-10-CM | POA: Diagnosis present

## 2019-12-16 DIAGNOSIS — Z170 Estrogen receptor positive status [ER+]: Secondary | ICD-10-CM | POA: Diagnosis present

## 2019-12-21 ENCOUNTER — Inpatient Hospital Stay (HOSPITAL_BASED_OUTPATIENT_CLINIC_OR_DEPARTMENT_OTHER): Payer: 59 | Admitting: Internal Medicine

## 2019-12-21 ENCOUNTER — Telehealth: Payer: Self-pay | Admitting: *Deleted

## 2019-12-21 ENCOUNTER — Other Ambulatory Visit: Payer: Self-pay

## 2019-12-21 ENCOUNTER — Inpatient Hospital Stay: Payer: 59 | Attending: Internal Medicine

## 2019-12-21 ENCOUNTER — Ambulatory Visit
Admission: RE | Admit: 2019-12-21 | Discharge: 2019-12-21 | Disposition: A | Discharge status: Home / Self Care | Payer: 59 | Attending: Internal Medicine | Admitting: Internal Medicine

## 2019-12-21 ENCOUNTER — Ambulatory Visit
Admission: RE | Admit: 2019-12-21 | Discharge: 2019-12-21 | Disposition: A | Discharge status: Home / Self Care | Payer: 59 | Source: Ambulatory Visit | Attending: Internal Medicine | Admitting: Internal Medicine

## 2019-12-21 VITALS — BP 125/84 | HR 58 | Temp 97.8°F | Wt 155.2 lb

## 2019-12-21 DIAGNOSIS — Z170 Estrogen receptor positive status [ER+]: Secondary | ICD-10-CM

## 2019-12-21 DIAGNOSIS — K7460 Unspecified cirrhosis of liver: Secondary | ICD-10-CM

## 2019-12-21 DIAGNOSIS — R911 Solitary pulmonary nodule: Secondary | ICD-10-CM | POA: Insufficient documentation

## 2019-12-21 DIAGNOSIS — F419 Anxiety disorder, unspecified: Secondary | ICD-10-CM | POA: Insufficient documentation

## 2019-12-21 DIAGNOSIS — R0789 Other chest pain: Secondary | ICD-10-CM | POA: Insufficient documentation

## 2019-12-21 DIAGNOSIS — Z853 Personal history of malignant neoplasm of breast: Secondary | ICD-10-CM | POA: Diagnosis present

## 2019-12-21 DIAGNOSIS — C50811 Malignant neoplasm of overlapping sites of right female breast: Secondary | ICD-10-CM

## 2019-12-21 DIAGNOSIS — F329 Major depressive disorder, single episode, unspecified: Secondary | ICD-10-CM | POA: Insufficient documentation

## 2019-12-21 DIAGNOSIS — D7589 Other specified diseases of blood and blood-forming organs: Secondary | ICD-10-CM | POA: Diagnosis not present

## 2019-12-21 LAB — COMPREHENSIVE METABOLIC PANEL
ALT: 13 U/L (ref 0–44)
AST: 26 U/L (ref 15–41)
Albumin: 3.4 g/dL — ABNORMAL LOW (ref 3.5–5.0)
Alkaline Phosphatase: 123 U/L (ref 38–126)
Anion gap: 10 (ref 5–15)
BUN: 9 mg/dL (ref 8–23)
CO2: 30 mmol/L (ref 22–32)
Calcium: 9.2 mg/dL (ref 8.9–10.3)
Chloride: 96 mmol/L — ABNORMAL LOW (ref 98–111)
Creatinine, Ser: 0.95 mg/dL (ref 0.44–1.00)
GFR calc Af Amer: >60 mL/min (ref >60)
GFR calc non Af Amer: >60 mL/min (ref >60)
Glucose, Bld: 97 mg/dL (ref 70–99)
Potassium: 4.5 mmol/L (ref 3.5–5.1)
Sodium: 136 mmol/L (ref 135–145)
Total Bilirubin: 0.5 mg/dL (ref 0.3–1.2)
Total Protein: 8 g/dL (ref 6.5–8.1)

## 2019-12-21 LAB — CBC WITH DIFFERENTIAL/PLATELET
Abs Immature Granulocytes: 0.01 10*3/uL (ref 0.00–0.07)
Basophils Absolute: 0.1 10*3/uL (ref 0.0–0.1)
Basophils Relative: 1 %
Eosinophils Absolute: 0.5 10*3/uL (ref 0.0–0.5)
Eosinophils Relative: 7 %
HCT: 38.1 % (ref 36.0–46.0)
Hemoglobin: 12.8 g/dL (ref 12.0–15.0)
Immature Granulocytes: 0 %
Lymphocytes Relative: 27 %
Lymphs Abs: 1.7 10*3/uL (ref 0.7–4.0)
MCH: 33.5 pg (ref 26.0–34.0)
MCHC: 33.6 g/dL (ref 30.0–36.0)
MCV: 99.7 fL (ref 80.0–100.0)
Monocytes Absolute: 0.8 10*3/uL (ref 0.1–1.0)
Monocytes Relative: 13 %
Neutro Abs: 3.3 10*3/uL (ref 1.7–7.7)
Neutrophils Relative %: 52 %
Platelets: 283 10*3/uL (ref 150–400)
RBC: 3.82 MIL/uL — ABNORMAL LOW (ref 3.87–5.11)
RDW: 13.1 % (ref 11.5–15.5)
WBC: 6.3 10*3/uL (ref 4.0–10.5)
nRBC: 0 % (ref 0.0–0.2)

## 2019-12-21 NOTE — Telephone Encounter (Signed)
Called report EXAM: CHEST - 2 VIEW  COMPARISON:  Chest CT 06/16/2019 and earlier.  FINDINGS: Moderate dextroconvex thoracic scoliosis. Mildly displaced right anterior 7th rib fracture is new from the December CT. No other right rib fracture is evident.  Stable large lung volumes. Mediastinal contours remain normal. Chronic increased interstitial markings. Centrilobular emphysema demonstrated on the prior CT. No pneumothorax, pleural effusion or acute pulmonary opacity. Visualized tracheal air column is within normal limits.  Negative visible bowel gas pattern.  IMPRESSION: 1. Mildly displaced right anterior 7th rib fracture is likely the symptomatic abnormality in this setting. 2. No acute cardiopulmonary abnormality.  Emphysema (ICD10-J43.9). 3. Moderate dextroconvex thoracic scoliosis.   Electronically Signed   By: Genevie Ann M.D.   On: 12/21/2019 11:43

## 2019-12-21 NOTE — Assessment & Plan Note (Addendum)
#   RIGHT BREAST CA STAGE II ER/PR- Pos; her 2 NEG; clinically no evidence of recurrence noted; STOPPED  aromatase inhibitor Secondary to intolerance. mammo-dec 2020- NEG. stable  # right chest wall pain- s/p fall; chest x-ray concern for rib fractures.  # Left upper lobe lung nodule-~ 1.5cm; stable [previously 1.4cm] followed by pulmonary; defer to pulmonary for follow up.  Stable.  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  Stable  # Anxiety/ depression/ memory issues-stable  # Cirrhosis- on CT-/Macrocytosis noted likely from alcohol.  Stable.  Ultrasound liver negative for any mass.  Recommend evaluation with GI with regards to further follow-up/management of cirrhosis.  #  DISPOSITION # CXR- today # refer to Dr.Anna/GI re: cirrhosis # Follow up in 6 months-MD- labs- cbc/cmp/AFP;-Dr.B  Addendum: chest x-ray shows rib fractures-no concerns for malignancy.  Recommend conservative measures patient informed.   Cc; Dr.Fleming

## 2019-12-21 NOTE — Patient Instructions (Signed)
CXR today.  

## 2019-12-21 NOTE — Telephone Encounter (Signed)
Spoke with patient. Patient aware that xray demonstrated a right 7th rib fracture. She thanked me for providing the results to her.

## 2019-12-21 NOTE — Progress Notes (Signed)
[facility] °OFFICE PROGRESS NOTE ° °Patient Care Team: °Bronstein, David, MD as PCP - General (Family Medicine) ° ° °Oncology History Overview Note  °# RIGHT BREAST STAGE II [pT1psN1; neg 15LN s/p ALND] s/p Lumpec [Dr.Smith] s/p RT; AC-taxol; AI [Aromasin; April 2016];STOPPED AI on Jan 2017.  April 2016- Mammo NEG °  °# LUL lung nodule- [poor surgical candidate] °  °# PN- G-2 on Neurontin °  °# Hot flashes- sec to AI. COPD- on 2 Lit [place] [Dr.Fleming] °  °Carcinoma of overlapping sites of right breast in female, estrogen receptor positive ([facility])  ° ° ° °INTERVAL HISTORY: ° °63-year-old female patient with a history of stage II breast cancer COPD on home O2; cirrhosis is here for follow-up/review results of the ultrasound liver.   ° °Denies any blood in stools or black or stools.  Denies any nausea vomiting.  Chronic shortness of breath chronic cough. ° °Chronic tingling and numbness in extremities not any worse.  No swelling the legs.  No abdominal distention. ° ° °Review of Systems  °Constitutional:  Positive for malaise/fatigue. Negative for chills, diaphoresis, fever and weight loss.  °HENT:  Negative for nosebleeds and sore throat.   °Eyes:  Negative for double vision.  °Respiratory:  Positive for cough and shortness of breath. Negative for hemoptysis, sputum production and wheezing.   °Cardiovascular:  Negative for chest pain, palpitations, orthopnea and leg swelling.  °Gastrointestinal:  Negative for abdominal pain, blood in stool, constipation, diarrhea, heartburn, melena, nausea and vomiting.  °Genitourinary:  Negative for dysuria, frequency and urgency.  °Musculoskeletal:  Negative for back pain and joint pain.  °Skin: Negative.  Negative for itching and rash.  °Neurological:  Positive for tingling. Negative for dizziness, focal weakness, weakness and headaches.  °Endo/Heme/Allergies:  Does not bruise/bleed easily.  °Psychiatric/Behavioral:  Negative for depression. The patient is  nervous/anxious. The patient does not have insomnia.   ° ° °PAST MEDICAL HISTORY :  °Past Medical History:  °Diagnosis Date  °• Abnormal CT scan, chest   °• Anxiety   °• Asthma   °• Breast cancer ([facility])   ° Breast CA- Right   °• Breast cancer ([facility]) 06/18/2011  ° right breast cancer  °• COPD (chronic obstructive pulmonary disease) ([facility])   °• Endometriosis   ° tx with vaginal hysterectomy  °• Fatty liver   °• Hepatitis C 1998  ° [facility] trial treatment in-patient study 2005. HCV 740, 05/20/11.  °• History of substance abuse ([facility])   ° Heroine, cocaine, marijuana. States all of them. Heavy use 1995 to 2000. Occasional use before and after. None since 2006.   °• Hot flashes related to aromatase inhibitor therapy   °• Hypertension   °• Lung mass   °• Peripheral neuropathy   ° in hands and feet  ° ° °PAST SURGICAL HISTORY :   °Past Surgical History:  °Procedure Laterality Date  °• BREAST LUMPECTOMY Right 2013  °• TUBAL LIGATION  1996  °• Upper right leg benign tumor removed    °• VAGINAL HYSTERECTOMY  2001  ° ° °FAMILY HISTORY :   °Family History  °Problem Relation Age of Onset  °• Diabetes Maternal Grandmother   °• Lung cancer Father   °• Lung cancer Mother   °• Hypertension Mother   °• Heart disease Mother   °• Skin cancer Mother   °• Breast cancer Neg Hx   ° ° °SOCIAL HISTORY:   °Social History  ° °Tobacco Use  °• Smoking status: Current Every   Day Smoker  °  Packs/day: 0.25  °  Years: 44.00  °  Pack years: 11.00  °  Types: Cigarettes  °  Last attempt to quit: 03/21/2016  °  Years since quitting: 3.7  °• Smokeless tobacco: Never Used  °• Tobacco comment: last cigeratte use 6 days ago (11/30/15)  °Vaping Use  °• Vaping Use: Never used  °Substance Use Topics  °• Alcohol use: Yes  °  Alcohol/week: 0.0 standard drinks  °• Drug use: No  ° ° °ALLERGIES:  is allergic to penicillins. ° °MEDICATIONS:  °Current Outpatient Medications  °Medication Sig Dispense Refill  °• albuterol (PROVENTIL) (2.5 MG/3ML) 0.083% nebulizer solution Take  2.5 mg by nebulization every 6 (six) hours as needed for wheezing.     °• BREO ELLIPTA 100-25 MCG/INH AEPB Inhale 1 puff into the lungs daily.    °• DULoxetine (CYMBALTA) 30 MG capsule Take 1 capsule (30 mg total) by mouth 2 (two) times daily. 180 capsule 3  °• gabapentin (NEURONTIN) 300 MG capsule TAKE 1 CAPSULE BY MOUTH 3  TIMES DAILY 270 capsule 3  °• lamiVUDine-zidovudine (COMBIVIR) 150-300 MG tablet Take 1 tablet by mouth 2 (two) times daily.    °• lamoTRIgine (LAMICTAL) 100 MG tablet Take 1 tablet by mouth daily.    °• metoprolol succinate (TOPROL-XL) 100 MG 24 hr tablet Take 100 mg by mouth daily. Take with or immediately following a meal.    °• Multiple Vitamin (MULTI-VITAMINS) TABS Take 1 tablet by mouth daily.     °• OXYGEN Inhale 2 L into the lungs at bedtime and may repeat dose one time if needed.     °• TRELEGY ELLIPTA 100-62.5-25 MCG/INH AEPB Inhale 1 puff into the lungs daily.    ° °No current facility-administered medications for this visit.  ° ° °PHYSICAL EXAMINATION: °ECOG PERFORMANCE STATUS: 0 - Asymptomatic ° °BP 125/84 (BP Location: Left Arm, Patient Position: Sitting, Cuff Size: Normal)    Pulse (!) 58    Temp 97.8 °F (36.6 °C) (Tympanic)    Wt 155 lb 4 oz (70.4 kg)    BMI 24.32 kg/m²  ° °Filed Weights  ° 12/21/19 1025  °Weight: 155 lb 4 oz (70.4 kg)  ° ° °Physical Exam °Constitutional:   °   Comments: Accompanied by family.  Nasal cannula oxygen.  °HENT:  °   Head: Normocephalic and atraumatic.  °   Mouth/Throat:  °   Pharynx: No oropharyngeal exudate.  °Eyes:  °   Pupils: Pupils are equal, round, and reactive to light.  °Cardiovascular:  °   Rate and Rhythm: Normal rate and regular rhythm.  °Pulmonary:  °   Effort: No respiratory distress.  °   Breath sounds: No wheezing.  °Abdominal:  °   General: Bowel sounds are normal. There is no distension.  °   Palpations: Abdomen is soft. There is no mass.  °   Tenderness: There is no abdominal tenderness. There is no guarding or rebound.   °Musculoskeletal:     °   General: No tenderness. Normal range of motion.  °   Cervical back: Normal range of motion and neck supple.  °Skin: °   General: Skin is warm.  °Neurological:  °   Mental Status: She is alert and oriented to person, place, and time.  °Psychiatric:     °   Mood and Affect: Affect normal.  ° ° ° ° ° °LABORATORY DATA:  °I have reviewed the data as listed °   °  Component Value Date/Time  ° NA 136 12/21/2019 1010  ° NA 133 (L) 07/04/2013 1216  ° K 4.5 12/21/2019 1010  ° K 3.5 07/04/2013 1216  ° CL 96 (L) 12/21/2019 1010  ° CL 99 07/04/2013 1216  ° CO2 30 12/21/2019 1010  ° CO2 30 07/04/2013 1216  ° GLUCOSE 97 12/21/2019 1010  ° GLUCOSE 100 (H) 07/04/2013 1216  ° BUN 9 12/21/2019 1010  ° BUN 15 07/04/2013 1216  ° CREATININE 0.95 12/21/2019 1010  ° CREATININE 0.83 10/12/2014 1239  ° CALCIUM 9.2 12/21/2019 1010  ° CALCIUM 8.8 07/04/2013 1216  ° PROT 8.0 12/21/2019 1010  ° PROT 8.5 (H) 10/12/2014 1239  ° ALBUMIN 3.4 (L) 12/21/2019 1010  ° ALBUMIN 4.1 10/12/2014 1239  ° AST 26 12/21/2019 1010  ° AST 44 (H) 10/12/2014 1239  ° ALT 13 12/21/2019 1010  ° ALT 28 10/12/2014 1239  ° ALKPHOS 123 12/21/2019 1010  ° ALKPHOS 69 10/12/2014 1239  ° BILITOT 0.5 12/21/2019 1010  ° BILITOT 0.6 10/12/2014 1239  ° GFRNONAA >60 12/21/2019 1010  ° GFRNONAA >60 10/12/2014 1239  ° GFRAA >60 12/21/2019 1010  ° GFRAA >60 10/12/2014 1239  ° ° °No results found for: SPEP, UPEP ° °Lab Results  °Component Value Date  ° WBC 6.3 12/21/2019  ° NEUTROABS 3.3 12/21/2019  ° HGB 12.8 12/21/2019  ° HCT 38.1 12/21/2019  ° MCV 99.7 12/21/2019  ° PLT 283 12/21/2019  ° ° °  Chemistry   °   °Component Value Date/Time  ° NA 136 12/21/2019 1010  ° NA 133 (L) 07/04/2013 1216  ° K 4.5 12/21/2019 1010  ° K 3.5 07/04/2013 1216  ° CL 96 (L) 12/21/2019 1010  ° CL 99 07/04/2013 1216  ° CO2 30 12/21/2019 1010  ° CO2 30 07/04/2013 1216  ° BUN 9 12/21/2019 1010  ° BUN 15 07/04/2013 1216  ° CREATININE 0.95 12/21/2019 1010  ° CREATININE 0.83 10/12/2014  1239  °    °Component Value Date/Time  ° CALCIUM 9.2 12/21/2019 1010  ° CALCIUM 8.8 07/04/2013 1216  ° ALKPHOS 123 12/21/2019 1010  ° ALKPHOS 69 10/12/2014 1239  ° AST 26 12/21/2019 1010  ° AST 44 (H) 10/12/2014 1239  ° ALT 13 12/21/2019 1010  ° ALT 28 10/12/2014 1239  ° BILITOT 0.5 12/21/2019 1010  ° BILITOT 0.6 10/12/2014 1239  °  ° ° °  ° °ASSESSMENT & PLAN:  °Carcinoma of overlapping sites of right breast in female, estrogen receptor positive ([facility]) °# RIGHT BREAST CA STAGE II ER/PR- Pos; her 2 NEG; clinically no evidence of recurrence noted; STOPPED  aromatase inhibitor Secondary to intolerance. mammo-dec 2020- NEG. stable ° °# right chest wall pain- s/p fall; chest x-ray concern for rib fractures. ° °# Left upper lobe lung nodule-~ 1.5cm; stable [previously 1.4cm] followed by pulmonary; defer to pulmonary for follow up.  Stable. ° °# PN-2-3-improved; on  Neurontin to 300 mg 3 times a day; continue duloxetine.  Stable ° °# Anxiety/ depression/ memory issues-stable ° °# Cirrhosis- on CT-/Macrocytosis noted likely from alcohol.  Stable.  Ultrasound liver negative for any mass.  Recommend evaluation with GI with regards to further follow-up/management of cirrhosis. ° °#  DISPOSITION °# CXR- today °# refer to Dr.Anna/GI re: cirrhosis °# Follow up in 6 months-MD- labs- cbc/cmp/AFP;-Dr.B ° °Addendum: chest x-ray shows rib fractures-no concerns for malignancy.  Recommend conservative measures patient informed. ° ° °Cc; Dr.Fleming ° °  ° Regina Mazzocco R Kimiye Strathman, MD °12/28/2019 11:31 AM °

## 2019-12-22 LAB — AFP TUMOR MARKER: AFP, Serum, Tumor Marker: 4.8 ng/mL (ref 0.0–8.3)

## 2020-03-02 ENCOUNTER — Ambulatory Visit: Payer: 59 | Admitting: Gastroenterology

## 2020-06-19 ENCOUNTER — Inpatient Hospital Stay: Payer: 59

## 2020-06-19 ENCOUNTER — Inpatient Hospital Stay: Payer: 59 | Admitting: Internal Medicine

## 2020-10-20 ENCOUNTER — Telehealth: Payer: Self-pay | Admitting: *Deleted

## 2020-10-20 NOTE — Telephone Encounter (Signed)
Regina Bernard, Please contact patient for an appointment since she cancelled her last appointment in Dec and said she would call back to r/s, but looks like she never did. She is requesting a refill of Gabapentin and has not been seen since June 2021 so we cannot refill this for her until she is seen. Thank you

## 2020-10-20 NOTE — Telephone Encounter (Signed)
LVM for pt about appt on WED 4/20 @ 10:00 told to call back if appt is not good.

## 2020-10-25 ENCOUNTER — Encounter (INDEPENDENT_AMBULATORY_CARE_PROVIDER_SITE_OTHER): Payer: Self-pay

## 2020-10-25 ENCOUNTER — Other Ambulatory Visit: Payer: Self-pay

## 2020-10-25 ENCOUNTER — Inpatient Hospital Stay (HOSPITAL_BASED_OUTPATIENT_CLINIC_OR_DEPARTMENT_OTHER): Payer: 59 | Admitting: Internal Medicine

## 2020-10-25 ENCOUNTER — Inpatient Hospital Stay: Payer: 59 | Attending: Internal Medicine

## 2020-10-25 ENCOUNTER — Encounter: Payer: Self-pay | Admitting: Internal Medicine

## 2020-10-25 VITALS — BP 148/87 | HR 58 | Temp 96.7°F | Resp 22 | Ht 66.0 in | Wt 148.9 lb

## 2020-10-25 DIAGNOSIS — Z79899 Other long term (current) drug therapy: Secondary | ICD-10-CM | POA: Diagnosis not present

## 2020-10-25 DIAGNOSIS — F17210 Nicotine dependence, cigarettes, uncomplicated: Secondary | ICD-10-CM | POA: Insufficient documentation

## 2020-10-25 DIAGNOSIS — K7460 Unspecified cirrhosis of liver: Secondary | ICD-10-CM | POA: Diagnosis not present

## 2020-10-25 DIAGNOSIS — F419 Anxiety disorder, unspecified: Secondary | ICD-10-CM | POA: Insufficient documentation

## 2020-10-25 DIAGNOSIS — C50811 Malignant neoplasm of overlapping sites of right female breast: Secondary | ICD-10-CM | POA: Diagnosis not present

## 2020-10-25 DIAGNOSIS — J449 Chronic obstructive pulmonary disease, unspecified: Secondary | ICD-10-CM | POA: Insufficient documentation

## 2020-10-25 DIAGNOSIS — R0789 Other chest pain: Secondary | ICD-10-CM

## 2020-10-25 DIAGNOSIS — Z170 Estrogen receptor positive status [ER+]: Secondary | ICD-10-CM | POA: Diagnosis not present

## 2020-10-25 DIAGNOSIS — F32A Depression, unspecified: Secondary | ICD-10-CM | POA: Insufficient documentation

## 2020-10-25 DIAGNOSIS — Z853 Personal history of malignant neoplasm of breast: Secondary | ICD-10-CM | POA: Insufficient documentation

## 2020-10-25 DIAGNOSIS — R911 Solitary pulmonary nodule: Secondary | ICD-10-CM

## 2020-10-25 DIAGNOSIS — Z803 Family history of malignant neoplasm of breast: Secondary | ICD-10-CM | POA: Insufficient documentation

## 2020-10-25 DIAGNOSIS — D7589 Other specified diseases of blood and blood-forming organs: Secondary | ICD-10-CM | POA: Insufficient documentation

## 2020-10-25 DIAGNOSIS — Z9981 Dependence on supplemental oxygen: Secondary | ICD-10-CM | POA: Diagnosis not present

## 2020-10-25 DIAGNOSIS — Z801 Family history of malignant neoplasm of trachea, bronchus and lung: Secondary | ICD-10-CM | POA: Diagnosis not present

## 2020-10-25 LAB — CBC WITH DIFFERENTIAL/PLATELET
Abs Immature Granulocytes: 0.02 10*3/uL (ref 0.00–0.07)
Basophils Absolute: 0 10*3/uL (ref 0.0–0.1)
Basophils Relative: 0 %
Eosinophils Absolute: 0.4 10*3/uL (ref 0.0–0.5)
Eosinophils Relative: 5 %
HCT: 43.6 % (ref 36.0–46.0)
Hemoglobin: 14.8 g/dL (ref 12.0–15.0)
Immature Granulocytes: 0 %
Lymphocytes Relative: 18 %
Lymphs Abs: 1.3 10*3/uL (ref 0.7–4.0)
MCH: 34.5 pg — ABNORMAL HIGH (ref 26.0–34.0)
MCHC: 33.9 g/dL (ref 30.0–36.0)
MCV: 101.6 fL — ABNORMAL HIGH (ref 80.0–100.0)
Monocytes Absolute: 0.8 10*3/uL (ref 0.1–1.0)
Monocytes Relative: 11 %
Neutro Abs: 4.8 10*3/uL (ref 1.7–7.7)
Neutrophils Relative %: 66 %
Platelets: 262 10*3/uL (ref 150–400)
RBC: 4.29 MIL/uL (ref 3.87–5.11)
RDW: 12.6 % (ref 11.5–15.5)
WBC: 7.3 10*3/uL (ref 4.0–10.5)
nRBC: 0 % (ref 0.0–0.2)

## 2020-10-25 LAB — COMPREHENSIVE METABOLIC PANEL
ALT: 15 U/L (ref 0–44)
AST: 31 U/L (ref 15–41)
Albumin: 3.9 g/dL (ref 3.5–5.0)
Alkaline Phosphatase: 106 U/L (ref 38–126)
Anion gap: 10 (ref 5–15)
BUN: 7 mg/dL — ABNORMAL LOW (ref 8–23)
CO2: 30 mmol/L (ref 22–32)
Calcium: 9.8 mg/dL (ref 8.9–10.3)
Chloride: 97 mmol/L — ABNORMAL LOW (ref 98–111)
Creatinine, Ser: 0.89 mg/dL (ref 0.44–1.00)
GFR, Estimated: >60 mL/min (ref >60)
Glucose, Bld: 80 mg/dL (ref 70–99)
Potassium: 4.2 mmol/L (ref 3.5–5.1)
Sodium: 137 mmol/L (ref 135–145)
Total Bilirubin: 0.6 mg/dL (ref 0.3–1.2)
Total Protein: 8.6 g/dL — ABNORMAL HIGH (ref 6.5–8.1)

## 2020-10-25 MED ORDER — GABAPENTIN 300 MG PO CAPS: 1.0000 | ORAL_CAPSULE | Freq: Three times a day (TID) | ORAL | 3 refills | Status: DC

## 2020-10-25 NOTE — Progress Notes (Signed)
[place] OFFICE PROGRESS NOTE  Patient Care Team: Juluis Pitch, MD as PCP - General (Family Medicine)   Oncology History Overview Note  # RIGHT BREAST STAGE II [pT1psN1; neg 15LN s/p ALND] s/p Lumpec [Dr.Smith] s/p RT; AC-taxol; [name]; April 2016];STOPPED AI on Jan 2017.  April 2016- Mammo NEG  # LUL lung nodule- [poor surgical candidate]  # PN- G-2 on Neurontin  # Hot flashes- sec to AI. COPD- on 2 Lit [place] [Dr.Fleming]   Carcinoma of overlapping sites of right breast in female, estrogen receptor positive ([place])     INTERVAL HISTORY:  65 year old female patient with a history of stage II breast cancer COPD on home O2; cirrhosis is here for follow-up.  Patient denies any worsening shortness of breath or cough.  She continues her chronic cough chronic shortness of breath.  Chronic tingling or numbness of extremities.  Requested refill for the Neurontin.  Otherwise denies any nausea vomiting swelling of the legs.   Review of Systems  Constitutional: Positive for malaise/fatigue. Negative for chills, diaphoresis, fever and weight loss.  HENT: Negative for nosebleeds and sore throat.   Eyes: Negative for double vision.  Respiratory: Positive for cough and shortness of breath. Negative for hemoptysis, sputum production and wheezing.   Cardiovascular: Negative for chest pain, palpitations, orthopnea and leg swelling.  Gastrointestinal: Negative for abdominal pain, blood in stool, constipation, diarrhea, heartburn, melena, nausea and vomiting.  Genitourinary: Negative for dysuria, frequency and urgency.  Musculoskeletal: Negative for back pain and joint pain.  Skin: Negative.  Negative for itching and rash.  Neurological: Positive for tingling. Negative for dizziness, focal weakness, weakness and headaches.  Endo/Heme/Allergies: Does not bruise/bleed easily.  Psychiatric/Behavioral: Negative for depression. The patient is nervous/anxious. The patient  does not have insomnia.      PAST MEDICAL HISTORY :  Past Medical History:  Diagnosis Date  . Abnormal CT scan, chest   . Anxiety   . Asthma   . Breast cancer ([place])    Breast CA- Right   . Breast cancer ([place]) 06/18/2011   right breast cancer  . COPD (chronic obstructive pulmonary disease) ([place])   . Endometriosis    tx with vaginal hysterectomy  . Fatty liver   . Hepatitis C 1998   [facility] trial treatment in-patient study 2005. HCV 740, 05/20/11.  [name] History of substance abuse ([place])    Heroine, cocaine, marijuana. States all of them. Heavy use 1995 to 2000. Occasional use before and after. None since 2006.   [name] Hot flashes related to aromatase inhibitor therapy   . Hypertension   . Lung mass   . Peripheral neuropathy    in hands and feet    PAST SURGICAL HISTORY :   Past Surgical History:  Procedure Laterality Date  . BREAST LUMPECTOMY Right 2013  . TUBAL LIGATION  1996  . Upper right leg benign tumor removed    . VAGINAL HYSTERECTOMY  2001    FAMILY HISTORY :   Family History  Problem Relation Age of Onset  . Diabetes Maternal Grandmother   . Lung cancer Father   . Lung cancer Mother   . Hypertension Mother   . Heart disease Mother   . Skin cancer Mother   . Breast cancer Neg Hx     SOCIAL HISTORY:   Social History   Tobacco Use  . Smoking status: Current Every Day Smoker    Packs/day: 0.25    Years: 44.00    Pack years: 11.00  Types: Cigarettes    Last attempt to quit: 03/21/2016    Years since quitting: 4.6  . Smokeless tobacco: Never Used  . Tobacco comment: last cigeratte use 6 days ago (11/30/15)  Vaping Use  . Vaping Use: Never used  Substance Use Topics  . Alcohol use: Yes    Alcohol/week: 0.0 standard drinks  . Drug use: No    ALLERGIES:  is allergic to penicillins.  MEDICATIONS:  Current Outpatient Medications  Medication Sig Dispense Refill  . albuterol (PROVENTIL) (2.5 MG/3ML) 0.083% nebulizer solution Take 2.5 mg by nebulization every  6 (six) hours as needed for wheezing.     [name] BREO ELLIPTA 100-25 MCG/INH AEPB Inhale 1 puff into the lungs daily.    . DULoxetine (CYMBALTA) 30 MG capsule Take 1 capsule (30 mg total) by mouth 2 (two) times daily. 180 capsule 3  . lamoTRIgine (LAMICTAL) 100 MG tablet Take 1 tablet by mouth daily.    . metoprolol succinate (TOPROL-XL) 100 MG 24 hr tablet Take 100 mg by mouth daily. Take with or immediately following a meal.    . Multiple Vitamin (MULTI-VITAMINS) TABS Take 1 tablet by mouth daily.     . OXYGEN Inhale 2 L into the lungs at bedtime and may repeat dose one time if needed.     . TRELEGY ELLIPTA 100-62.5-25 MCG/INH AEPB Inhale 1 puff into the lungs daily.    [name] gabapentin (NEURONTIN) 300 MG capsule Take 1 capsule (300 mg total) by mouth 3 (three) times daily. 270 capsule 3   No current facility-administered medications for this visit.    PHYSICAL EXAMINATION: ECOG PERFORMANCE STATUS: 0 - Asymptomatic  BP (!) 148/87   Pulse (!) 58   Temp (!) 96.7 F (35.9 C) (Tympanic)   Resp (!) 22   Ht 5\' 6"  (1.676 m)   Wt 148 lb 14.4 oz (67.5 kg)   SpO2 98%   BMI 24.03 kg/m   Filed Weights   10/25/20 1040  Weight: 148 lb 14.4 oz (67.5 kg)    Physical Exam Constitutional:      Comments: Appears cachectic.  Alone.  Nasal cannula oxygen.  Ambulating independently.  HENT:     Head: Normocephalic and atraumatic.     Mouth/Throat:     Pharynx: No oropharyngeal exudate.  Eyes:     Pupils: Pupils are equal, round, and reactive to light.  Cardiovascular:     Rate and Rhythm: Normal rate and regular rhythm.  Pulmonary:     Effort: No respiratory distress.     Breath sounds: No wheezing.     Comments: Decreased air entry bilaterally. Abdominal:     General: Bowel sounds are normal. There is no distension.     Palpations: Abdomen is soft. There is no mass.     Tenderness: There is no abdominal tenderness. There is no guarding or rebound.  Musculoskeletal:        General: No  tenderness. Normal range of motion.     Cervical back: Normal range of motion and neck supple.  Skin:    General: Skin is warm.     Comments: Right and left BREAST exam (in the presence of nurse)- no unusual skin changes or dominant masses felt. Surgical scars noted.    Neurological:     Mental Status: She is alert and oriented to person, place, and time.  Psychiatric:        Mood and Affect: Affect normal.        LABORATORY DATA:  I have reviewed the data as listed    Component Value Date/Time   NA 137 10/25/2020 1014   NA 133 (L) 07/04/2013 1216   K 4.2 10/25/2020 1014   K 3.5 07/04/2013 1216   CL 97 (L) 10/25/2020 1014   CL 99 07/04/2013 1216   CO2 30 10/25/2020 1014   CO2 30 07/04/2013 1216   GLUCOSE 80 10/25/2020 1014   GLUCOSE 100 (H) 07/04/2013 1216   BUN 7 (L) 10/25/2020 1014   BUN 15 07/04/2013 1216   CREATININE 0.89 10/25/2020 1014   CREATININE 0.83 10/12/2014 1239   CALCIUM 9.8 10/25/2020 1014   CALCIUM 8.8 07/04/2013 1216   PROT 8.6 (H) 10/25/2020 1014   PROT 8.5 (H) 10/12/2014 1239   ALBUMIN 3.9 10/25/2020 1014   ALBUMIN 4.1 10/12/2014 1239   AST 31 10/25/2020 1014   AST 44 (H) 10/12/2014 1239   ALT 15 10/25/2020 1014   ALT 28 10/12/2014 1239   ALKPHOS 106 10/25/2020 1014   ALKPHOS 69 10/12/2014 1239   BILITOT 0.6 10/25/2020 1014   BILITOT 0.6 10/12/2014 1239   GFRNONAA >60 10/25/2020 1014   GFRNONAA >60 10/12/2014 1239   GFRAA >60 12/21/2019 1010   GFRAA >60 10/12/2014 1239    No results found for: SPEP, UPEP  Lab Results  Component Value Date   WBC 7.3 10/25/2020   NEUTROABS 4.8 10/25/2020   HGB 14.8 10/25/2020   HCT 43.6 10/25/2020   MCV 101.6 (H) 10/25/2020   PLT 262 10/25/2020      Chemistry      Component Value Date/Time   NA 137 10/25/2020 1014   NA 133 (L) 07/04/2013 1216   K 4.2 10/25/2020 1014   K 3.5 07/04/2013 1216   CL 97 (L) 10/25/2020 1014   CL 99 07/04/2013 1216   CO2 30 10/25/2020 1014   CO2 30 07/04/2013 1216    BUN 7 (L) 10/25/2020 1014   BUN 15 07/04/2013 1216   CREATININE 0.89 10/25/2020 1014   CREATININE 0.83 10/12/2014 1239      Component Value Date/Time   CALCIUM 9.8 10/25/2020 1014   CALCIUM 8.8 07/04/2013 1216   ALKPHOS 106 10/25/2020 1014   ALKPHOS 69 10/12/2014 1239   AST 31 10/25/2020 1014   AST 44 (H) 10/12/2014 1239   ALT 15 10/25/2020 1014   ALT 28 10/12/2014 1239   BILITOT 0.6 10/25/2020 1014   BILITOT 0.6 10/12/2014 1239         ASSESSMENT & PLAN:  Carcinoma of overlapping sites of right breast in female, estrogen receptor positive ([place]) # RIGHT BREAST CA STAGE II ER/PR- Pos; her 2 NEG; clinically no evidence of recurrence noted; STOPPED  aromatase inhibitor Secondary to intolerance. mammo-dec 2020- NEG- STABLE. Will need ordered today.   # Left upper lobe lung nodule-~ 1.5cm; stable [previously 1.4cm]recommend follow up CT scab.  # PN-2-3-improved; on  Neurontin to 300 mg 3 times a day [refilled]; continue duloxetine.  Stable  # Anxiety/ depression/ memory issues-stable  # Cirrhosis- on CT-/Macrocytosis noted likely from alcohol.  Stable.  Ultrasound liver negative for any mass.    #  DISPOSITION #  bil screening mammogram 1-2 weeks # CT scan chest in 1-2 weeks # Follow up in 6 months-MD- labs- cbc/cmp/AFP;-Dr.B   Cc; Dr.Fleming     Cammie Sickle, MD 10/26/2020 12:58 PM

## 2020-10-25 NOTE — Assessment & Plan Note (Addendum)
#   RIGHT BREAST CA STAGE II ER/PR- Pos; her 2 NEG; clinically no evidence of recurrence noted; STOPPED  aromatase inhibitor Secondary to intolerance. mammo-dec 2020- NEG- STABLE. Will need ordered today.   # Left upper lobe lung nodule-~ 1.5cm; stable [previously 1.4cm]recommend follow up CT scab.  # PN-2-3-improved; on  Neurontin to 300 mg 3 times a day [refilled]; continue duloxetine.  Stable  # Anxiety/ depression/ memory issues-stable  # Cirrhosis- on CT-/Macrocytosis noted likely from alcohol.  Stable.  Ultrasound liver negative for any mass.    #  DISPOSITION #  bil screening mammogram 1-2 weeks # CT scan chest in 1-2 weeks # Follow up in 6 months-MD- labs- cbc/cmp/AFP;-Dr.B   Cc; Dr.Fleming 

## 2020-10-26 LAB — AFP TUMOR MARKER: AFP, Serum, Tumor Marker: 3 ng/mL (ref 0.0–9.2)

## 2020-11-08 ENCOUNTER — Other Ambulatory Visit: Payer: 59

## 2020-11-16 ENCOUNTER — Ambulatory Visit: Admission: RE | Admit: 2020-11-16 | Payer: 59 | Source: Ambulatory Visit

## 2020-12-06 ENCOUNTER — Ambulatory Visit: Payer: 59 | Attending: Internal Medicine

## 2020-12-26 ENCOUNTER — Ambulatory Visit: Admission: RE | Admit: 2020-12-26 | Payer: 59 | Source: Ambulatory Visit

## 2020-12-27 ENCOUNTER — Ambulatory Visit
Admission: RE | Admit: 2020-12-27 | Discharge: 2020-12-27 | Disposition: A | Discharge status: Home / Self Care | Payer: 59 | Source: Ambulatory Visit | Attending: Internal Medicine | Admitting: Internal Medicine

## 2020-12-27 ENCOUNTER — Other Ambulatory Visit: Payer: Self-pay

## 2020-12-27 DIAGNOSIS — R911 Solitary pulmonary nodule: Secondary | ICD-10-CM | POA: Diagnosis not present

## 2020-12-29 ENCOUNTER — Telehealth: Payer: Self-pay | Admitting: Internal Medicine

## 2020-12-29 MED ORDER — AZITHROMYCIN 250 MG PO TABS: ORAL_TABLET | ORAL | 0 refills | Status: DC

## 2020-12-29 NOTE — Telephone Encounter (Signed)
On 6/23-spoke to patient regarding results of the CT scan stable right upper lobe lung nodule; right lower lobe-suspicious for atypical infection.  Recommend Z-Pak.  Recommend evaluation with pulmonary; patient has appointment with Dr. Raul Del next week.  GB

## 2021-04-25 ENCOUNTER — Inpatient Hospital Stay: Payer: 59

## 2021-04-25 ENCOUNTER — Other Ambulatory Visit: Payer: Self-pay

## 2021-04-25 ENCOUNTER — Inpatient Hospital Stay: Payer: 59 | Admitting: Internal Medicine

## 2021-04-25 ENCOUNTER — Inpatient Hospital Stay: Payer: 59 | Attending: Internal Medicine

## 2021-04-25 DIAGNOSIS — Z853 Personal history of malignant neoplasm of breast: Secondary | ICD-10-CM | POA: Diagnosis not present

## 2021-04-25 DIAGNOSIS — R911 Solitary pulmonary nodule: Secondary | ICD-10-CM

## 2021-04-25 DIAGNOSIS — C50811 Malignant neoplasm of overlapping sites of right female breast: Secondary | ICD-10-CM

## 2021-04-25 DIAGNOSIS — Z23 Encounter for immunization: Secondary | ICD-10-CM | POA: Insufficient documentation

## 2021-04-25 DIAGNOSIS — Z170 Estrogen receptor positive status [ER+]: Secondary | ICD-10-CM

## 2021-04-25 LAB — COMPREHENSIVE METABOLIC PANEL
ALT: 15 U/L (ref 0–44)
AST: 37 U/L (ref 15–41)
Albumin: 3.6 g/dL (ref 3.5–5.0)
Alkaline Phosphatase: 99 U/L (ref 38–126)
Anion gap: 9 (ref 5–15)
BUN: 15 mg/dL (ref 8–23)
CO2: 29 mmol/L (ref 22–32)
Calcium: 8.8 mg/dL — ABNORMAL LOW (ref 8.9–10.3)
Chloride: 89 mmol/L — ABNORMAL LOW (ref 98–111)
Creatinine, Ser: 1.02 mg/dL — ABNORMAL HIGH (ref 0.44–1.00)
GFR, Estimated: >60 mL/min (ref >60)
Glucose, Bld: 82 mg/dL (ref 70–99)
Potassium: 4.5 mmol/L (ref 3.5–5.1)
Sodium: 127 mmol/L — ABNORMAL LOW (ref 135–145)
Total Bilirubin: 0.5 mg/dL (ref 0.3–1.2)
Total Protein: 7.8 g/dL (ref 6.5–8.1)

## 2021-04-25 LAB — CBC WITH DIFFERENTIAL/PLATELET
Abs Immature Granulocytes: 0.02 10*3/uL (ref 0.00–0.07)
Basophils Absolute: 0 10*3/uL (ref 0.0–0.1)
Basophils Relative: 0 %
Eosinophils Absolute: 0.4 10*3/uL (ref 0.0–0.5)
Eosinophils Relative: 6 %
HCT: 31.8 % — ABNORMAL LOW (ref 36.0–46.0)
Hemoglobin: 10.4 g/dL — ABNORMAL LOW (ref 12.0–15.0)
Immature Granulocytes: 0 %
Lymphocytes Relative: 20 %
Lymphs Abs: 1.4 10*3/uL (ref 0.7–4.0)
MCH: 34.8 pg — ABNORMAL HIGH (ref 26.0–34.0)
MCHC: 32.7 g/dL (ref 30.0–36.0)
MCV: 106.4 fL — ABNORMAL HIGH (ref 80.0–100.0)
Monocytes Absolute: 0.8 10*3/uL (ref 0.1–1.0)
Monocytes Relative: 12 %
Neutro Abs: 4.1 10*3/uL (ref 1.7–7.7)
Neutrophils Relative %: 62 %
Platelets: 307 10*3/uL (ref 150–400)
RBC: 2.99 MIL/uL — ABNORMAL LOW (ref 3.87–5.11)
RDW: 13.1 % (ref 11.5–15.5)
WBC: 6.7 10*3/uL (ref 4.0–10.5)
nRBC: 0 % (ref 0.0–0.2)

## 2021-04-25 MED ORDER — INFLUENZA VAC SPLIT QUAD 0.5 ML IM SUSY
0.5000 mL | PREFILLED_SYRINGE | Freq: Once | INTRAMUSCULAR | Status: AC
  Administered 2021-04-25: 0.5 mL via INTRAMUSCULAR
  Filled 2021-04-25: qty 0.5

## 2021-04-26 LAB — AFP TUMOR MARKER: AFP, Serum, Tumor Marker: 2.9 ng/mL (ref 0.0–9.2)

## 2021-05-09 ENCOUNTER — Inpatient Hospital Stay: Payer: Self-pay | Attending: Internal Medicine | Admitting: Oncology

## 2021-05-09 ENCOUNTER — Other Ambulatory Visit: Payer: 59

## 2021-05-09 NOTE — Progress Notes (Incomplete)
[place] OFFICE PROGRESS NOTE  Patient Care Team: Juluis Pitch, MD as PCP - General (Family Medicine)   Oncology History Overview Note  # RIGHT BREAST STAGE II [pT1psN1; neg 15LN s/p ALND] s/p Lumpec [Dr.Smith] s/p RT; AC-taxol; [name]; April 2016];STOPPED AI on Jan 2017.  April 2016- Mammo NEG   # LUL lung nodule- [poor surgical candidate]   # PN- G-2 on Neurontin   # Hot flashes- sec to AI. COPD- on 2 Lit [place] [Dr.Fleming]   Carcinoma of overlapping sites of right breast in female, estrogen receptor positive ([place])     INTERVAL HISTORY:  65 year old female patient with a history of stage II breast cancer COPD on home O2; cirrhosis is here for follow-up/review results of the ultrasound liver.    Denies any blood in stools or black or stools.  Denies any nausea vomiting.  Chronic shortness of breath chronic cough.  Chronic tingling and numbness in extremities not any worse.  No swelling the legs.  No abdominal distention.   Review of Systems  Constitutional:  Positive for malaise/fatigue. Negative for chills, diaphoresis, fever and weight loss.  HENT:  Negative for nosebleeds and sore throat.   Eyes:  Negative for double vision.  Respiratory:  Positive for cough and shortness of breath. Negative for hemoptysis, sputum production and wheezing.   Cardiovascular:  Negative for chest pain, palpitations, orthopnea and leg swelling.  Gastrointestinal:  Negative for abdominal pain, blood in stool, constipation, diarrhea, heartburn, melena, nausea and vomiting.  Genitourinary:  Negative for dysuria, frequency and urgency.  Musculoskeletal:  Negative for back pain and joint pain.  Skin: Negative.  Negative for itching and rash.  Neurological:  Positive for tingling. Negative for dizziness, focal weakness, weakness and headaches.  Endo/Heme/Allergies:  Does not bruise/bleed easily.  Psychiatric/Behavioral:  Negative for depression. The patient is  nervous/anxious. The patient does not have insomnia.     PAST MEDICAL HISTORY :  Past Medical History:  Diagnosis Date   Abnormal CT scan, chest    Anxiety    Asthma    Breast cancer ([place])    Breast CA- Right    Breast cancer ([place]) 06/18/2011   right breast cancer   COPD (chronic obstructive pulmonary disease) ([facility])    Endometriosis    tx with vaginal hysterectomy   Fatty liver    Hepatitis C 1998   [facility] trial treatment in-patient study 2005. HCV 740, 05/20/11.   History of substance abuse ([place])    Heroine, cocaine, marijuana. States all of them. Heavy use 1995 to 2000. Occasional use before and after. None since 2006.    Hot flashes related to aromatase inhibitor therapy    Hypertension    Lung mass    Peripheral neuropathy    in hands and feet    PAST SURGICAL HISTORY :   Past Surgical History:  Procedure Laterality Date   BREAST LUMPECTOMY Right 2013   TUBAL LIGATION  1996   Upper right leg benign tumor removed     VAGINAL HYSTERECTOMY  2001    FAMILY HISTORY :   Family History  Problem Relation Age of Onset   Diabetes Maternal Grandmother    Lung cancer Father    Lung cancer Mother    Hypertension Mother    Heart disease Mother    Skin cancer Mother    Breast cancer Neg Hx     SOCIAL HISTORY:   Social History   Tobacco Use   Smoking status: Current Every  Day Smoker    Packs/day: 0.25    Years: 44.00    Pack years: 11.00    Types: Cigarettes    Last attempt to quit: 03/21/2016    Years since quitting: 3.7   Smokeless tobacco: Never Used   Tobacco comment: last cigeratte use 6 days ago (11/30/15)  Vaping Use   Vaping Use: Never used  Substance Use Topics   Alcohol use: Yes    Alcohol/week: 0.0 standard drinks   Drug use: No    ALLERGIES:  is allergic to penicillins.  MEDICATIONS:  Current Outpatient Medications  Medication Sig Dispense Refill   albuterol (PROVENTIL) (2.5 MG/3ML) 0.083% nebulizer solution Take  2.5 mg by nebulization every 6 (six) hours as needed for wheezing.      BREO ELLIPTA 100-25 MCG/INH AEPB Inhale 1 puff into the lungs daily.     DULoxetine (CYMBALTA) 30 MG capsule Take 1 capsule (30 mg total) by mouth 2 (two) times daily. 180 capsule 3   gabapentin (NEURONTIN) 300 MG capsule TAKE 1 CAPSULE BY MOUTH 3  TIMES DAILY 270 capsule 3   lamiVUDine-zidovudine (COMBIVIR) 150-300 MG tablet Take 1 tablet by mouth 2 (two) times daily.     lamoTRIgine (LAMICTAL) 100 MG tablet Take 1 tablet by mouth daily.     metoprolol succinate (TOPROL-XL) 100 MG 24 hr tablet Take 100 mg by mouth daily. Take with or immediately following a meal.     Multiple Vitamin (MULTI-VITAMINS) TABS Take 1 tablet by mouth daily.      OXYGEN Inhale 2 L into the lungs at bedtime and may repeat dose one time if needed.      TRELEGY ELLIPTA 100-62.5-25 MCG/INH AEPB Inhale 1 puff into the lungs daily.     No current facility-administered medications for this visit.    PHYSICAL EXAMINATION: ECOG PERFORMANCE STATUS: 0 - Asymptomatic  BP 125/84 (BP Location: Left Arm, Patient Position: Sitting, Cuff Size: Normal)    Pulse (!) 58    Temp 97.8 F (36.6 C) (Tympanic)    Wt 155 lb 4 oz (70.4 kg)    BMI 24.32 kg/m   Filed Weights   12/21/19 1025  Weight: 155 lb 4 oz (70.4 kg)    Physical Exam Constitutional:      Comments: Accompanied by family.  Nasal cannula oxygen.  HENT:     Head: Normocephalic and atraumatic.     Mouth/Throat:     Pharynx: No oropharyngeal exudate.  Eyes:     Pupils: Pupils are equal, round, and reactive to light.  Cardiovascular:     Rate and Rhythm: Normal rate and regular rhythm.  Pulmonary:     Effort: No respiratory distress.     Breath sounds: No wheezing.  Abdominal:     General: Bowel sounds are normal. There is no distension.     Palpations: Abdomen is soft. There is no mass.     Tenderness: There is no abdominal tenderness. There is no guarding or rebound.   Musculoskeletal:        General: No tenderness. Normal range of motion.     Cervical back: Normal range of motion and neck supple.  Skin:    General: Skin is warm.  Neurological:     Mental Status: She is alert and oriented to person, place, and time.  Psychiatric:        Mood and Affect: Affect normal.       LABORATORY DATA:  I have reviewed the data as listed  Component Value Date/Time   NA 136 12/21/2019 1010   NA 133 (L) 07/04/2013 1216   K 4.5 12/21/2019 1010   K 3.5 07/04/2013 1216   CL 96 (L) 12/21/2019 1010   CL 99 07/04/2013 1216   CO2 30 12/21/2019 1010   CO2 30 07/04/2013 1216   GLUCOSE 97 12/21/2019 1010   GLUCOSE 100 (H) 07/04/2013 1216   BUN 9 12/21/2019 1010   BUN 15 07/04/2013 1216   CREATININE 0.95 12/21/2019 1010   CREATININE 0.83 10/12/2014 1239   CALCIUM 9.2 12/21/2019 1010   CALCIUM 8.8 07/04/2013 1216   PROT 8.0 12/21/2019 1010   PROT 8.5 (H) 10/12/2014 1239   ALBUMIN 3.4 (L) 12/21/2019 1010   ALBUMIN 4.1 10/12/2014 1239   AST 26 12/21/2019 1010   AST 44 (H) 10/12/2014 1239   ALT 13 12/21/2019 1010   ALT 28 10/12/2014 1239   ALKPHOS 123 12/21/2019 1010   ALKPHOS 69 10/12/2014 1239   BILITOT 0.5 12/21/2019 1010   BILITOT 0.6 10/12/2014 1239   GFRNONAA >60 12/21/2019 1010   GFRNONAA >60 10/12/2014 1239   GFRAA >60 12/21/2019 1010   GFRAA >60 10/12/2014 1239    No results found for: SPEP, UPEP  Lab Results  Component Value Date   WBC 6.3 12/21/2019   NEUTROABS 3.3 12/21/2019   HGB 12.8 12/21/2019   HCT 38.1 12/21/2019   MCV 99.7 12/21/2019   PLT 283 12/21/2019      Chemistry      Component Value Date/Time   NA 136 12/21/2019 1010   NA 133 (L) 07/04/2013 1216   K 4.5 12/21/2019 1010   K 3.5 07/04/2013 1216   CL 96 (L) 12/21/2019 1010   CL 99 07/04/2013 1216   CO2 30 12/21/2019 1010   CO2 30 07/04/2013 1216   BUN 9 12/21/2019 1010   BUN 15 07/04/2013 1216   CREATININE 0.95 12/21/2019 1010   CREATININE 0.83 10/12/2014  1239      Component Value Date/Time   CALCIUM 9.2 12/21/2019 1010   CALCIUM 8.8 07/04/2013 1216   ALKPHOS 123 12/21/2019 1010   ALKPHOS 69 10/12/2014 1239   AST 26 12/21/2019 1010   AST 44 (H) 10/12/2014 1239   ALT 13 12/21/2019 1010   ALT 28 10/12/2014 1239   BILITOT 0.5 12/21/2019 1010   BILITOT 0.6 10/12/2014 1239         ASSESSMENT & PLAN:  Carcinoma of overlapping sites of right breast in female, estrogen receptor positive ([place]) # RIGHT BREAST CA STAGE II ER/PR- Pos; her 2 NEG; clinically no evidence of recurrence noted; STOPPED  aromatase inhibitor Secondary to intolerance. mammo-dec 2020- NEG. stable  # right chest wall pain- s/p fall; chest x-ray concern for rib fractures.  # Left upper lobe lung nodule-~ 1.5cm; stable [previously 1.4cm] followed by pulmonary; defer to pulmonary for follow up.  Stable.  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  Stable  # Anxiety/ depression/ memory issues-stable  # Cirrhosis- on CT-/Macrocytosis noted likely from alcohol.  Stable.  Ultrasound liver negative for any mass.  Recommend evaluation with GI with regards to further follow-up/management of cirrhosis.  #  DISPOSITION # CXR- today # refer to Dr.Anna/GI re: cirrhosis # Follow up in 6 months-MD- labs- cbc/cmp/AFP;-Dr.B  Addendum: chest x-ray shows rib fractures-no concerns for malignancy.  Recommend conservative measures patient informed.   Cc; Dr.Fleming     Cammie Sickle, MD 12/28/2019 11:31 AM

## 2021-08-29 ENCOUNTER — Other Ambulatory Visit: Payer: Self-pay | Admitting: Family Medicine

## 2021-08-29 DIAGNOSIS — Z1231 Encounter for screening mammogram for malignant neoplasm of breast: Secondary | ICD-10-CM

## 2021-09-05 ENCOUNTER — Other Ambulatory Visit: Payer: Self-pay

## 2021-09-05 ENCOUNTER — Ambulatory Visit
Admission: RE | Admit: 2021-09-05 | Discharge: 2021-09-05 | Disposition: A | Discharge status: Home / Self Care | Payer: 59 | Source: Ambulatory Visit | Attending: Family Medicine | Admitting: Family Medicine

## 2021-09-05 DIAGNOSIS — Z1231 Encounter for screening mammogram for malignant neoplasm of breast: Secondary | ICD-10-CM | POA: Diagnosis not present

## 2021-10-04 ENCOUNTER — Other Ambulatory Visit: Payer: Self-pay | Admitting: Orthopedic Surgery

## 2021-10-04 DIAGNOSIS — R634 Abnormal weight loss: Secondary | ICD-10-CM

## 2021-10-04 DIAGNOSIS — Z853 Personal history of malignant neoplasm of breast: Secondary | ICD-10-CM

## 2021-10-04 DIAGNOSIS — G8929 Other chronic pain: Secondary | ICD-10-CM

## 2021-10-04 DIAGNOSIS — M25551 Pain in right hip: Secondary | ICD-10-CM

## 2021-10-09 ENCOUNTER — Ambulatory Visit
Admission: RE | Admit: 2021-10-09 | Discharge: 2021-10-09 | Disposition: A | Discharge status: Home / Self Care | Payer: No Typology Code available for payment source | Source: Ambulatory Visit | Attending: Orthopedic Surgery | Admitting: Orthopedic Surgery

## 2021-10-09 DIAGNOSIS — G8929 Other chronic pain: Secondary | ICD-10-CM

## 2021-10-09 DIAGNOSIS — Z853 Personal history of malignant neoplasm of breast: Secondary | ICD-10-CM

## 2021-10-09 DIAGNOSIS — M25551 Pain in right hip: Secondary | ICD-10-CM

## 2021-10-09 DIAGNOSIS — R634 Abnormal weight loss: Secondary | ICD-10-CM

## 2021-10-09 MED ORDER — IOPAMIDOL (ISOVUE-300) INJECTION 61%
100.0000 mL | Freq: Once | INTRAVENOUS | Status: AC | PRN
  Administered 2021-10-09: 100 mL via INTRAVENOUS

## 2021-10-10 ENCOUNTER — Other Ambulatory Visit: Payer: Self-pay | Admitting: Orthopedic Surgery

## 2021-10-10 DIAGNOSIS — R634 Abnormal weight loss: Secondary | ICD-10-CM

## 2021-10-10 DIAGNOSIS — Z853 Personal history of malignant neoplasm of breast: Secondary | ICD-10-CM

## 2021-10-10 DIAGNOSIS — M5136 Other intervertebral disc degeneration, lumbar region: Secondary | ICD-10-CM

## 2021-10-10 DIAGNOSIS — M51369 Other intervertebral disc degeneration, lumbar region without mention of lumbar back pain or lower extremity pain: Secondary | ICD-10-CM

## 2021-10-10 DIAGNOSIS — M545 Low back pain, unspecified: Secondary | ICD-10-CM

## 2021-10-12 ENCOUNTER — Ambulatory Visit
Admission: RE | Admit: 2021-10-12 | Discharge: 2021-10-12 | Disposition: A | Discharge status: Home / Self Care | Payer: No Typology Code available for payment source | Source: Ambulatory Visit | Attending: Orthopedic Surgery | Admitting: Orthopedic Surgery

## 2021-10-12 DIAGNOSIS — M5136 Other intervertebral disc degeneration, lumbar region: Secondary | ICD-10-CM

## 2021-10-12 DIAGNOSIS — Z853 Personal history of malignant neoplasm of breast: Secondary | ICD-10-CM

## 2021-10-12 DIAGNOSIS — M545 Low back pain, unspecified: Secondary | ICD-10-CM

## 2021-10-12 DIAGNOSIS — R634 Abnormal weight loss: Secondary | ICD-10-CM

## 2021-10-17 ENCOUNTER — Other Ambulatory Visit: Payer: No Typology Code available for payment source

## 2021-10-28 ENCOUNTER — Inpatient Hospital Stay
Admission: EM | Admit: 2021-10-28 | Discharge: 2021-11-01 | DRG: 378 | Disposition: A | Discharge status: Home / Self Care | Payer: Self-pay | Attending: Hospitalist | Admitting: Hospitalist

## 2021-10-28 ENCOUNTER — Encounter: Payer: Self-pay | Admitting: Internal Medicine

## 2021-10-28 ENCOUNTER — Emergency Department: Payer: Self-pay

## 2021-10-28 DIAGNOSIS — J449 Chronic obstructive pulmonary disease, unspecified: Secondary | ICD-10-CM | POA: Diagnosis present

## 2021-10-28 DIAGNOSIS — Z9011 Acquired absence of right breast and nipple: Secondary | ICD-10-CM

## 2021-10-28 DIAGNOSIS — J439 Emphysema, unspecified: Secondary | ICD-10-CM | POA: Diagnosis present

## 2021-10-28 DIAGNOSIS — Z8249 Family history of ischemic heart disease and other diseases of the circulatory system: Secondary | ICD-10-CM

## 2021-10-28 DIAGNOSIS — K264 Chronic or unspecified duodenal ulcer with hemorrhage: Secondary | ICD-10-CM | POA: Diagnosis present

## 2021-10-28 DIAGNOSIS — F32A Depression, unspecified: Secondary | ICD-10-CM | POA: Diagnosis present

## 2021-10-28 DIAGNOSIS — K7460 Unspecified cirrhosis of liver: Secondary | ICD-10-CM

## 2021-10-28 DIAGNOSIS — G8929 Other chronic pain: Secondary | ICD-10-CM | POA: Diagnosis present

## 2021-10-28 DIAGNOSIS — K922 Gastrointestinal hemorrhage, unspecified: Principal | ICD-10-CM | POA: Diagnosis present

## 2021-10-28 DIAGNOSIS — G629 Polyneuropathy, unspecified: Secondary | ICD-10-CM | POA: Diagnosis present

## 2021-10-28 DIAGNOSIS — Z90710 Acquired absence of both cervix and uterus: Secondary | ICD-10-CM

## 2021-10-28 DIAGNOSIS — Z681 Body mass index (BMI) 19 or less, adult: Secondary | ICD-10-CM

## 2021-10-28 DIAGNOSIS — Z7951 Long term (current) use of inhaled steroids: Secondary | ICD-10-CM

## 2021-10-28 DIAGNOSIS — I959 Hypotension, unspecified: Secondary | ICD-10-CM | POA: Diagnosis present

## 2021-10-28 DIAGNOSIS — Z79899 Other long term (current) drug therapy: Secondary | ICD-10-CM

## 2021-10-28 DIAGNOSIS — K2950 Unspecified chronic gastritis without bleeding: Secondary | ICD-10-CM | POA: Diagnosis present

## 2021-10-28 DIAGNOSIS — C50811 Malignant neoplasm of overlapping sites of right female breast: Secondary | ICD-10-CM

## 2021-10-28 DIAGNOSIS — D62 Acute posthemorrhagic anemia: Secondary | ICD-10-CM | POA: Diagnosis present

## 2021-10-28 DIAGNOSIS — Z880 Allergy status to penicillin: Secondary | ICD-10-CM

## 2021-10-28 DIAGNOSIS — Z9981 Dependence on supplemental oxygen: Secondary | ICD-10-CM

## 2021-10-28 DIAGNOSIS — T39395A Adverse effect of other nonsteroidal anti-inflammatory drugs [NSAID], initial encounter: Secondary | ICD-10-CM | POA: Diagnosis present

## 2021-10-28 DIAGNOSIS — B9681 Helicobacter pylori [H. pylori] as the cause of diseases classified elsewhere: Secondary | ICD-10-CM | POA: Diagnosis present

## 2021-10-28 DIAGNOSIS — Z720 Tobacco use: Secondary | ICD-10-CM

## 2021-10-28 DIAGNOSIS — Z170 Estrogen receptor positive status [ER+]: Secondary | ICD-10-CM

## 2021-10-28 DIAGNOSIS — F411 Generalized anxiety disorder: Secondary | ICD-10-CM | POA: Diagnosis present

## 2021-10-28 DIAGNOSIS — D649 Anemia, unspecified: Secondary | ICD-10-CM

## 2021-10-28 DIAGNOSIS — R64 Cachexia: Secondary | ICD-10-CM | POA: Diagnosis present

## 2021-10-28 DIAGNOSIS — F17290 Nicotine dependence, other tobacco product, uncomplicated: Secondary | ICD-10-CM | POA: Diagnosis present

## 2021-10-28 DIAGNOSIS — Z923 Personal history of irradiation: Secondary | ICD-10-CM

## 2021-10-28 DIAGNOSIS — K254 Chronic or unspecified gastric ulcer with hemorrhage: Principal | ICD-10-CM | POA: Diagnosis present

## 2021-10-28 DIAGNOSIS — Z9221 Personal history of antineoplastic chemotherapy: Secondary | ICD-10-CM

## 2021-10-28 DIAGNOSIS — R0602 Shortness of breath: Secondary | ICD-10-CM

## 2021-10-28 DIAGNOSIS — K279 Peptic ulcer, site unspecified, unspecified as acute or chronic, without hemorrhage or perforation: Secondary | ICD-10-CM

## 2021-10-28 DIAGNOSIS — J9611 Chronic respiratory failure with hypoxia: Secondary | ICD-10-CM | POA: Diagnosis present

## 2021-10-28 DIAGNOSIS — I10 Essential (primary) hypertension: Secondary | ICD-10-CM | POA: Diagnosis present

## 2021-10-28 DIAGNOSIS — Z801 Family history of malignant neoplasm of trachea, bronchus and lung: Secondary | ICD-10-CM

## 2021-10-28 DIAGNOSIS — K760 Fatty (change of) liver, not elsewhere classified: Secondary | ICD-10-CM | POA: Diagnosis present

## 2021-10-28 DIAGNOSIS — Z853 Personal history of malignant neoplasm of breast: Secondary | ICD-10-CM

## 2021-10-28 DIAGNOSIS — Z20822 Contact with and (suspected) exposure to covid-19: Secondary | ICD-10-CM | POA: Diagnosis present

## 2021-10-28 HISTORY — DX: Unspecified cirrhosis of liver: K74.60

## 2021-10-28 LAB — CBC WITH DIFFERENTIAL/PLATELET
Abs Immature Granulocytes: 0.02 10*3/uL (ref 0.00–0.07)
Basophils Absolute: 0 10*3/uL (ref 0.0–0.1)
Basophils Relative: 1 %
Eosinophils Absolute: 0.1 10*3/uL (ref 0.0–0.5)
Eosinophils Relative: 2 %
HCT: 18.1 % — ABNORMAL LOW (ref 36.0–46.0)
Hemoglobin: 5.8 g/dL — ABNORMAL LOW (ref 12.0–15.0)
Immature Granulocytes: 0 %
Lymphocytes Relative: 16 %
Lymphs Abs: 1 10*3/uL (ref 0.7–4.0)
MCH: 33 pg (ref 26.0–34.0)
MCHC: 32 g/dL (ref 30.0–36.0)
MCV: 102.8 fL — ABNORMAL HIGH (ref 80.0–100.0)
Monocytes Absolute: 0.5 10*3/uL (ref 0.1–1.0)
Monocytes Relative: 9 %
Neutro Abs: 4.4 10*3/uL (ref 1.7–7.7)
Neutrophils Relative %: 72 %
Platelets: 355 10*3/uL (ref 150–400)
RBC: 1.76 MIL/uL — ABNORMAL LOW (ref 3.87–5.11)
RDW: 14.2 % (ref 11.5–15.5)
WBC: 6.1 10*3/uL (ref 4.0–10.5)
nRBC: 0 % (ref 0.0–0.2)

## 2021-10-28 LAB — COMPREHENSIVE METABOLIC PANEL
ALT: 16 U/L (ref 0–44)
AST: 40 U/L (ref 15–41)
Albumin: 2.6 g/dL — ABNORMAL LOW (ref 3.5–5.0)
Alkaline Phosphatase: 96 U/L (ref 38–126)
Anion gap: 7 (ref 5–15)
BUN: 41 mg/dL — ABNORMAL HIGH (ref 8–23)
CO2: 27 mmol/L (ref 22–32)
Calcium: 9.1 mg/dL (ref 8.9–10.3)
Chloride: 100 mmol/L (ref 98–111)
Creatinine, Ser: 0.97 mg/dL (ref 0.44–1.00)
GFR, Estimated: >60 mL/min (ref >60)
Glucose, Bld: 137 mg/dL — ABNORMAL HIGH (ref 70–99)
Potassium: 4.3 mmol/L (ref 3.5–5.1)
Sodium: 134 mmol/L — ABNORMAL LOW (ref 135–145)
Total Bilirubin: 0.6 mg/dL (ref 0.3–1.2)
Total Protein: 6.6 g/dL (ref 6.5–8.1)

## 2021-10-28 LAB — ABO/RH: ABO/RH(D): A POS

## 2021-10-28 LAB — RESP PANEL BY RT-PCR (FLU A&B, COVID) ARPGX2
Influenza A by PCR: NEGATIVE
Influenza B by PCR: NEGATIVE
SARS Coronavirus 2 by RT PCR: NEGATIVE

## 2021-10-28 LAB — PREPARE RBC (CROSSMATCH)

## 2021-10-28 LAB — BRAIN NATRIURETIC PEPTIDE: B Natriuretic Peptide: 151.2 pg/mL — ABNORMAL HIGH (ref 0.0–100.0)

## 2021-10-28 LAB — TROPONIN I (HIGH SENSITIVITY)
Troponin I (High Sensitivity): 8 ng/L (ref <18)
Troponin I (High Sensitivity): 8 ng/L (ref <18)

## 2021-10-28 MED ORDER — DULOXETINE HCL 30 MG PO CPEP
30.0000 mg | ORAL_CAPSULE | Freq: Two times a day (BID) | ORAL | Status: DC
  Administered 2021-10-29 – 2021-11-01 (×7): 30 mg via ORAL
  Filled 2021-10-28 (×7): qty 1

## 2021-10-28 MED ORDER — LACTATED RINGERS IV BOLUS
500.0000 mL | Freq: Once | INTRAVENOUS | Status: AC
  Administered 2021-10-28: 500 mL via INTRAVENOUS

## 2021-10-28 MED ORDER — SODIUM CHLORIDE 0.9 % IV SOLN: 10.0000 mL/h | Freq: Once | INTRAVENOUS | Status: DC

## 2021-10-28 MED ORDER — MELATONIN 5 MG PO TABS: 5.0000 mg | ORAL_TABLET | Freq: Every evening | ORAL | Status: DC | PRN

## 2021-10-28 MED ORDER — FLUTICASONE FUROATE-VILANTEROL 100-25 MCG/ACT IN AEPB
1.0000 | INHALATION_SPRAY | Freq: Every day | RESPIRATORY_TRACT | Status: DC
  Administered 2021-10-29 – 2021-10-31 (×3): 1 via RESPIRATORY_TRACT
  Filled 2021-10-28: qty 28

## 2021-10-28 MED ORDER — OCTREOTIDE LOAD VIA INFUSION
50.0000 ug | Freq: Once | INTRAVENOUS | Status: AC
Start: 2021-10-28 — End: 2021-10-28
  Administered 2021-10-28: 50 ug via INTRAVENOUS
  Filled 2021-10-28: qty 25

## 2021-10-28 MED ORDER — PANTOPRAZOLE 80MG IVPB - SIMPLE MED
80.0000 mg | Freq: Once | INTRAVENOUS | Status: AC
  Administered 2021-10-28: 80 mg via INTRAVENOUS
  Filled 2021-10-28: qty 100

## 2021-10-28 MED ORDER — SODIUM CHLORIDE 0.9 % IV SOLN
1.0000 g | Freq: Once | INTRAVENOUS | Status: AC
  Administered 2021-10-28: 1 g via INTRAVENOUS
  Filled 2021-10-28: qty 10

## 2021-10-28 MED ORDER — LORAZEPAM 2 MG/ML IJ SOLN: 1.0000 mg | Freq: Four times a day (QID) | INTRAMUSCULAR | Status: DC | PRN

## 2021-10-28 MED ORDER — SODIUM CHLORIDE 0.9 % IV SOLN
50.0000 ug/h | INTRAVENOUS | Status: DC
  Administered 2021-10-28 – 2021-10-29 (×2): 50 ug/h via INTRAVENOUS
  Filled 2021-10-28 (×3): qty 1

## 2021-10-28 MED ORDER — QUETIAPINE FUMARATE 25 MG PO TABS
25.0000 mg | ORAL_TABLET | Freq: Every day | ORAL | Status: DC
Start: 2021-10-29 — End: 2021-11-01
  Administered 2021-10-29: 25 mg via ORAL
  Filled 2021-10-28 (×3): qty 1

## 2021-10-28 MED ORDER — PANTOPRAZOLE SODIUM 40 MG IV SOLR: 40.0000 mg | Freq: Two times a day (BID) | INTRAVENOUS | Status: DC

## 2021-10-28 MED ORDER — ALBUTEROL SULFATE (2.5 MG/3ML) 0.083% IN NEBU: 2.5000 mg | INHALATION_SOLUTION | Freq: Four times a day (QID) | RESPIRATORY_TRACT | Status: DC | PRN

## 2021-10-28 MED ORDER — ACETAMINOPHEN 650 MG RE SUPP: 650.0000 mg | Freq: Four times a day (QID) | RECTAL | Status: AC | PRN

## 2021-10-28 MED ORDER — PANTOPRAZOLE INFUSION (NEW) - SIMPLE MED
8.0000 mg/h | INTRAVENOUS | Status: AC
  Administered 2021-10-28 – 2021-10-31 (×6): 8 mg/h via INTRAVENOUS
  Filled 2021-10-28 (×7): qty 100

## 2021-10-28 MED ORDER — IOHEXOL 350 MG/ML SOLN
75.0000 mL | Freq: Once | INTRAVENOUS | Status: AC | PRN
  Administered 2021-10-28: 75 mL via INTRAVENOUS

## 2021-10-28 MED ORDER — UMECLIDINIUM BROMIDE 62.5 MCG/ACT IN AEPB
1.0000 | INHALATION_SPRAY | Freq: Every day | RESPIRATORY_TRACT | Status: DC
  Administered 2021-10-29 – 2021-10-31 (×3): 1 via RESPIRATORY_TRACT
  Filled 2021-10-28: qty 7

## 2021-10-28 MED ORDER — ADULT MULTIVITAMIN W/MINERALS CH
1.0000 | ORAL_TABLET | Freq: Every day | ORAL | Status: DC
  Administered 2021-10-29 – 2021-11-01 (×4): 1 via ORAL
  Filled 2021-10-28 (×4): qty 1

## 2021-10-28 MED ORDER — LAMOTRIGINE 100 MG PO TABS
100.0000 mg | ORAL_TABLET | Freq: Every day | ORAL | Status: DC
  Administered 2021-10-29 – 2021-11-01 (×4): 100 mg via ORAL
  Filled 2021-10-28 (×4): qty 1

## 2021-10-28 MED ORDER — ONDANSETRON HCL 4 MG/2ML IJ SOLN: 4.0000 mg | Freq: Four times a day (QID) | INTRAMUSCULAR | Status: DC | PRN

## 2021-10-28 MED ORDER — MIDODRINE HCL 5 MG PO TABS: 10.0000 mg | ORAL_TABLET | ORAL | Status: DC | PRN

## 2021-10-28 MED ORDER — METOPROLOL SUCCINATE ER 100 MG PO TB24
100.0000 mg | ORAL_TABLET | Freq: Every day | ORAL | Status: DC
Start: 2021-10-29 — End: 2021-11-01
  Administered 2021-10-29 – 2021-11-01 (×4): 100 mg via ORAL
  Filled 2021-10-28 (×4): qty 1

## 2021-10-28 MED ORDER — PANTOPRAZOLE SODIUM 40 MG IV SOLR: 40.0000 mg | Freq: Once | INTRAVENOUS | Status: DC

## 2021-10-28 MED ORDER — ONDANSETRON HCL 4 MG PO TABS: 4.0000 mg | ORAL_TABLET | Freq: Four times a day (QID) | ORAL | Status: DC | PRN

## 2021-10-28 MED ORDER — ACETAMINOPHEN 325 MG PO TABS: 650.0000 mg | ORAL_TABLET | Freq: Four times a day (QID) | ORAL | Status: AC | PRN

## 2021-10-28 NOTE — Assessment & Plan Note (Addendum)
-   Baseline oxygen supplementation requirement, approximately 2 L nasal cannula ?- At Her baseline now ?-Continue to monitor ?-Continue home inhalers ?

## 2021-10-28 NOTE — H&P (Signed)
?History and Physical  ? ?Regina Bernard KXF:[id number] DOB: 10-28-1955 DOA: 10/28/2021 ? ?PCP: Juluis Pitch, MD  ?Outpatient Specialists: Dr. Rogue Bussing ?Patient coming from: Home ? ?I have personally briefly reviewed patient's old medical records in [place]. ? ?Chief Concern: Weakness, melena, generalized malaise ? ?HPI: Regina Bernard is a 66 year old female with severe COPD on chronic SPO2 is 2 L, depression, anxiety, hypertension, breast cancer, history of liver cirrhosis without noted varices, who presents emergency department for chief concerns of weakness, general malaise, and dark stools for 1 week. ? ?Initial vitals in the emergency department showed temperature of 97.5, respiration rate of 17, heart rate 77, blood pressure 92/53, SPO2 of 100% on 2 L nasal cannula. ? ?Serum sodium is 134, potassium 4.3, chloride 100, bicarb 25, BUN of 41, serum creatinine of 0.97, GFR greater than 60, nonfasting blood glucose 137, WBC 6.1, hemoglobin 5.8, platelets of 355. ? ?BNP was 151.2.  High sensitive troponin was 8. ? ?Portable chest x-ray: Was read as patchy upper lobe opacity, new from prior, possibly reflecting infection/pneumonia.  Given masslike appearance of the left upper lobe, consider CT chest with contrast for further evaluation. ? ?CTA of the chest PE: Was read as left upper lobe consolidation suggestive of infection/inflammation.  Underlying malignancy is not excluded. ? ?ED treatment: Ceftriaxone 1 g, Protonix bolus and gtt., octreotide GGT, LR at 500 mL bolus. ? ?At bedside patient is awake alert and oriented to self, age, location, and current calendar year. ? ?She has been feeling weakness for about 3 weeks.  She states that she had 3 days of black stools.  She also endorses that over the last 3 weeks she has associated shortness of breath with exertion.  She states she is never felt this way before.  She states that she has chronic back pain and takes ibuprofen and Tylenol handfuls at  a time daily.  She states she has been doing this for about 6 months.  She states that she is currently taking Suboxone and has been titrating the dose down. ? ?She states that she is not supposed to be taking any Percocets. ? ?Social history: She lives at home with her husband. She is a former tobacco user quitting about 8 months ago. Formerly, she smoked 2 packs/day. She is currently vaping nicotine daily. She endorses alcohol use, last drink was half a margarita the night of 10/27/2021. She is a former drug user including heroin, cocaine, crack, marijuana. ? ?Vaccination history: She states she has received 1 vaccine for COVID-19.  She states she is vaccinated for influenza. ? ?ROS: ?Constitutional: no weight change, no fever ?ENT/Mouth: no sore throat, no rhinorrhea ?Eyes: no eye pain, no vision changes ?Cardiovascular: no chest pain, + dyspnea,  no edema, no palpitations ?Respiratory: no cough, no sputum, no wheezing ?Gastrointestinal: + nausea, no vomiting, no diarrhea, no constipation ?Genitourinary: no urinary incontinence, no dysuria, no hematuria ?Musculoskeletal: no arthralgias, no myalgias ?Skin: no skin lesions, no pruritus, ?Neuro: + weakness, no loss of consciousness, no syncope ?Psych: no anxiety, no depression, + decrease appetite ?Heme/Lymph: no bruising, no bleeding ? ?ED Course: Discussed with emergency medicine provider, patient requiring hospitalization for chief concerns of acute anemia, concerning for upper GI bleed. ? ?Assessment/Plan ? ?Principal Problem: ?  Upper GI bleed ?Active Problems: ?  Severe chronic obstructive pulmonary disease ([place]) ?  Carcinoma of overlapping sites of right breast in female, estrogen receptor positive ([place]) ?  Generalized anxiety disorder ?  Essential  hypertension ?  Anemia due to acute blood loss ?  Liver cirrhosis ([place]) ?  ?Assessment and Plan: ? ?* Upper GI bleed ?- Continue octreotide, Protonix gtt. ?- EDP prior has ordered 2 units of PRBC for transfusion ?-  Discussed with nursing staff to ensure 2, large-bore if able, PIV ?- Repeat CBC at 2300 on day of admission ?- Goal blood glucose level is 8 ?- GI has been consulted, Dr. Marius Ditch, and and states they will see the patient ?- Admit to progressive cardiac, observation ? ?Liver cirrhosis ([place]) ?- Appears compensated at this time and not in acute exacerbation ? ?Anemia due to acute blood loss ?- Presumed secondary to upper GI bleed ?- 2 units of PRBC ordered by EDP ?- Discussed with nursing staff to ensure two, large-bore, PIV ? ?Essential hypertension ?- Per outpatient [facility] clinic note, in February 2023, patient takes metoprolol 100 mg daily, this has been resumed ? ?Generalized anxiety disorder ?- Patient takes duloxetine 60 mg daily, Seroquel 25 mg daily, Lamictal 100 mg daily, this has been resumed ?- Ativan 1 mg IV every 6 hours as needed for anxiety, 2 doses ordered ? ?Carcinoma of overlapping sites of right breast in female, estrogen receptor positive ([place]) ?- Status postchemotherapy, radiation, partial mastectomy with lymph node removal on the right side ?- Continue follow-up with outpatient oncology provider recommended ? ?Severe chronic obstructive pulmonary disease ([place]) ?- Baseline oxygen supplementation requirement, approximately 2 L nasal cannula ?- Currently on 4 L nasal cannula and satting appropriately ? ?General health maintenance-patient states she has never had a colonoscopy ?- She states that she has a Cologuard, home testing however she has not done this yet ?- I attempted to educate patient on getting colonoscopy given her history of breast cancer ?- Patient does not appear to understand, recommend follow-up with PCP for further discussion-status post chemotherapy with radiation and surgery with lymph node removal on the right side ? ?DVT prophylaxis-I have not initiated pharmacologic DVT prophylaxis due to acute anemia concerning for acute blood loss. ?- AM team to initiate pharmacologic DVT  prophylaxis when appropriate ? ?Chart reviewed.  ? ?DVT prophylaxis: TED hose ?Code Status: Full code ?Diet: Clear liquids, n.p.o. after midnight ?Family Communication: No ?Disposition Plan: Pending clinical course, pending GI consultation/evaluation ?Consults called: Gastroenterology ?Admission status: Progressive, observation ? ?Past Medical History:  ?Diagnosis Date  ? Abnormal CT scan, chest   ? Anxiety   ? Asthma   ? Breast cancer ([place])   ? Breast CA- Right   ? Breast cancer ([place]) 06/18/2011  ? right breast cancer  ? COPD (chronic obstructive pulmonary disease) ([place])   ? Endometriosis   ? tx with vaginal hysterectomy  ? Fatty liver   ? Hepatitis C 1998  ? [facility] trial treatment in-patient study 2005. HCV 740, 05/20/11.  ? History of substance abuse ([place])   ? Heroine, cocaine, marijuana. States all of them. Heavy use 1995 to 2000. Occasional use before and after. None since 2006.   ? Hot flashes related to aromatase inhibitor therapy   ? Hypertension   ? Lung mass   ? Peripheral neuropathy   ? in hands and feet  ? ?Past Surgical History:  ?Procedure Laterality Date  ? BREAST LUMPECTOMY Right 2013  ? TUBAL LIGATION  1996  ? Upper right leg benign tumor removed    ? VAGINAL HYSTERECTOMY  2001  ? ?Social History:  reports that she has been smoking cigarettes. She has a 11.00 pack-year smoking history. She  has never used smokeless tobacco. She reports current alcohol use. She reports that she does not use drugs. ? ?Allergies  ?Allergen Reactions  ? Penicillins Swelling and Rash  ?  Has patient had a PCN reaction causing immediate rash, facial/tongue/throat swelling, SOB or lightheadedness with hypotension: Yes ?Has patient had a PCN reaction causing severe rash involving mucus membranes or skin necrosis: No ?Has patient had a PCN reaction that required hospitalization: No ?Has patient had a PCN reaction occurring within the last 10 years: No ?If all of the above answers are "NO", then may proceed with Cephalosporin  use. ?  ? ?Family History  ?Problem Relation Age of Onset  ? Diabetes Maternal Grandmother   ? Lung cancer Father   ? Lung cancer Mother   ? Hypertension Mother   ? Heart disease Mother   ? Skin cancer [place]

## 2021-10-28 NOTE — ED Provider Notes (Signed)
? ?[facility] ?Provider Note ? ? ? Event Date/Time  ? First MD Initiated Contact with Patient 10/28/21 1835   ?  (approximate) ? ? ?History  ? ?Chief Complaint ?Weakness (Patient presents with weakness with falls, general malaise, and reports of dark tarry stools x 1 week; Hypotensive upon arrival (24M systolic)) ? ? ?HPI ? ?DAYTON SHERR is a 66 y.o. female with past medical history of hypertension, COPD on 3L nasal cannula, and hepatitis C who presents to the ED complaining of weakness and shortness of breath.  Patient reports that she has been feeling increasingly weak, cold, and short of breath for the past 2 days.  She denies any fevers or cough, does state that she was feeling sharp discomfort in her chest at one point in time, but denies any chest pain currently.  She does admit to having dark tarry stools for the past 2 days, has not noticed any bright red blood in her stool and denies any vomiting.  She has not had any pain in her abdomen and denies any history of GI bleed, does not take a blood thinner.  She does report a history of hepatitis, is unsure whether she has cirrhosis but has never been told that she has varices. ? ?  ? ? ?Physical Exam  ? ?Triage Vital Signs: ?ED Triage Vitals  ?Enc Vitals Group  ?   BP   ?   Pulse   ?   Resp   ?   Temp   ?   Temp src   ?   SpO2   ?   Weight   ?   Height   ?   Head Circumference   ?   Peak Flow   ?   Pain Score   ?   Pain Loc   ?   Pain Edu?   ?   Excl. in [place]?   ? ? ?Most recent vital signs: ?Vitals:  ? 10/28/21 1900 10/28/21 1908  ?BP: (!) 92/53 (!) 91/57  ?Pulse:  65  ?Resp: 17 14  ?Temp:    ?SpO2:  100%  ? ? ?Constitutional: Alert and oriented.  Pale and ill-appearing. ?Eyes: Conjunctivae are normal. ?Head: Atraumatic. ?Nose: No congestion/rhinnorhea. ?Mouth/Throat: Mucous membranes are moist. ?Cardiovascular: Normal rate, regular rhythm. Grossly normal heart sounds.  2+ radial pulses bilaterally. ?Respiratory: Normal respiratory  effort.  No retractions. Lungs CTAB. ?Gastrointestinal: Soft and nontender. No distention. ?Musculoskeletal: No lower extremity tenderness nor edema.  ?Neurologic:  Normal speech and language. No gross focal neurologic deficits are appreciated. ? ? ? ?ED Results / Procedures / Treatments  ? ?Labs ?(all labs ordered are listed, but only abnormal results are displayed) ?Labs Reviewed  ?CBC WITH DIFFERENTIAL/PLATELET - Abnormal; Notable for the following components:  ?    Result Value  ? RBC 1.76 (*)   ? Hemoglobin 5.8 (*)   ? HCT 18.1 (*)   ? MCV 102.8 (*)   ? All other components within normal limits  ?COMPREHENSIVE METABOLIC PANEL - Abnormal; Notable for the following components:  ? Sodium 134 (*)   ? Glucose, Bld 137 (*)   ? BUN 41 (*)   ? Albumin 2.6 (*)   ? All other components within normal limits  ?BRAIN NATRIURETIC PEPTIDE - Abnormal; Notable for the following components:  ? B Natriuretic Peptide 151.2 (*)   ? All other components within normal limits  ?RESP PANEL BY RT-PCR (FLU A&B, COVID) ARPGX2  ?HIV ANTIBODY (ROUTINE  TESTING W REFLEX)  ?BASIC METABOLIC PANEL  ?CBC  ?CBC  ?TYPE AND SCREEN  ?PREPARE RBC (CROSSMATCH)  ?TYPE AND SCREEN  ?ABO/RH  ?TROPONIN I (HIGH SENSITIVITY)  ?TROPONIN I (HIGH SENSITIVITY)  ? ? ? ?EKG ? ?ED ECG REPORT ?Tempie Hoist, the attending physician, personally viewed and interpreted this ECG. ? ? Date: 10/28/2021 ? EKG Time: 19:06 ? Rate: 66 ? Rhythm: normal sinus rhythm ? Axis: Normal ? Intervals:none ? ST&T Change: None ? ?RADIOLOGY ?Chest XR reviewed by me with bilateral upper lobe infiltrates, no edema or effusion noted. ? ?PROCEDURES: ? ?Critical Care performed: Yes, see critical care procedure note(s) ? ?.Critical Care ?Performed by: Blake Divine, MD ?Authorized by: Blake Divine, MD  ? ?Critical care provider statement:  ?  Critical care time (minutes):  45 ?  Critical care time was exclusive of:  Separately billable procedures and treating other patients and teaching  time ?  Critical care was necessary to treat or prevent imminent or life-threatening deterioration of the following conditions:  Circulatory failure ?  Critical care was time spent personally by me on the following activities:  Development of treatment plan with patient or surrogate, discussions with consultants, evaluation of patient's response to treatment, examination of patient, ordering and review of laboratory studies, ordering and review of radiographic studies, ordering and performing treatments and interventions, pulse oximetry, re-evaluation of patient's condition and review of old charts ?  I assumed direction of critical care for this patient from another provider in my specialty: no   ?  Care discussed with: admitting provider   ? ? ?MEDICATIONS ORDERED IN ED: ?Medications  ?0.9 %  sodium chloride infusion (0 mL/hr Intravenous Hold 10/28/21 1956)  ?cefTRIAXone (ROCEPHIN) 1 g in sodium chloride 0.9 % 100 mL IVPB (has no administration in time range)  ?pantoprazole (PROTONIX) 80 mg /NS 100 mL IVPB (has no administration in time range)  ?pantoprozole (PROTONIX) 80 mg /NS 100 mL infusion (has no administration in time range)  ?pantoprazole (PROTONIX) injection 40 mg (has no administration in time range)  ?octreotide (SANDOSTATIN) 2 mcg/mL load via infusion 50 mcg (has no administration in time range)  ?  And  ?octreotide (SANDOSTATIN) 500 mcg in sodium chloride 0.9 % 250 mL (2 mcg/mL) infusion (has no administration in time range)  ?acetaminophen (TYLENOL) tablet 650 mg (has no administration in time range)  ?  Or  ?acetaminophen (TYLENOL) suppository 650 mg (has no administration in time range)  ?ondansetron (ZOFRAN) tablet 4 mg (has no administration in time range)  ?  Or  ?ondansetron (ZOFRAN) injection 4 mg (has no administration in time range)  ?melatonin tablet 5 mg (has no administration in time range)  ?midodrine (PROAMATINE) tablet 10 mg (has no administration in time range)  ?LORazepam (ATIVAN)  injection 1 mg (has no administration in time range)  ?fluticasone furoate-vilanterol (BREO ELLIPTA) 100-25 MCG/ACT 1 puff (has no administration in time range)  ?  And  ?umeclidinium bromide (INCRUSE ELLIPTA) 62.5 MCG/ACT 1 puff (has no administration in time range)  ?albuterol (PROVENTIL) (2.5 MG/3ML) 0.083% nebulizer solution 2.5 mg (has no administration in time range)  ?lactated ringers bolus 500 mL (500 mLs Intravenous New Bag/Given 10/28/21 1910)  ?iohexol (OMNIPAQUE) 350 MG/ML injection 75 mL (75 mLs Intravenous Contrast Given 10/28/21 2011)  ? ? ? ?IMPRESSION / MDM / ASSESSMENT AND PLAN / ED COURSE  ?I reviewed the triage vital signs and the nursing notes. ?             ?               ? ?  66 y.o. female with past medical history of hypertension, COPD on 3 L nasal cannula, and hepatitis C who presents to the ED complaining of increased weakness, shortness of breath, and feeling cold for the past 2 days with recent dark tarry stools. ? ?Differential diagnosis includes, but is not limited to, upper GI bleed, lower GI bleed, anemia, dehydration, electrolyte abnormality, variceal bleed, COPD exacerbation, pneumonia, PE, ACS. ? ?Patient is pale and ill-appearing on arrival, blood pressure is borderline low but MAP remains greater than 65 and we will resuscitate initially with IV fluids.  Patient has a benign abdominal exam and I suspect upper GI bleed with her reported dark tarry stools.  Labs confirm anemia with hemoglobin of less than than 6, likely the source of her weakness and shortness of breath.  We will transfuse 2 units PRBCs and case discussed with Dr. Marius Ditch of GI, who recommends starting patient on Protonix drip as well as octreotide drip given her history of cirrhosis.  Remainder of labs are reassuring with troponin within normal limits and no significant elevation of BNP to suggest CHF.  Chest x-ray does show potential lung mass as well as bilateral upper lobe infiltrates, low suspicion for pneumonia  at this time given lack of fever, leukocytosis, or cough.  We will check CTA of chest to rule out PE as well as further assess mass.  Case discussed with hospitalist for admission. ? ?  ? ? ?FINAL CLINICAL

## 2021-10-28 NOTE — Assessment & Plan Note (Addendum)
-   Status postchemotherapy, radiation, partial mastectomy with lymph node removal on the right side ?- Continue follow-up with outpatient oncology provider recommended ?-CT chest is also concerning for left upper lobe consolidation/underlying malignancy, needs to be followed up as an outpatient ?

## 2021-10-28 NOTE — Hospital Course (Addendum)
[name] is a 66 year old female with severe COPD on chronic SPO2 is 2 L, depression, anxiety, hypertension, breast cancer, history of liver cirrhosis without noted varices, who presents emergency department for chief concerns of weakness, general malaise, and dark stools for 1 week. ? She also endorses that over the last 3 weeks she has associated shortness of breath with exertion.  She states she is never felt this way before.  She states that she has chronic back pain and takes ibuprofen and Tylenol handfuls at a time daily.  She states she has been doing this for about 6 months.   ? ?Initial vitals in the emergency department showed temperature of 97.5, respiration rate of 17, heart rate 77, blood pressure 92/53, SPO2 of 100% on 2 L nasal cannula. ? ?Serum sodium is 134, potassium 4.3, chloride 100, bicarb 25, BUN of 41, serum creatinine of 0.97, GFR greater than 60, nonfasting blood glucose 137, WBC 6.1, hemoglobin 5.8, platelets of 355. ? ?BNP was 151.2.  High sensitive troponin was 8. ? ?Portable chest x-ray: Was read as patchy upper lobe opacity, new from prior, possibly reflecting infection/pneumonia.  Given masslike appearance of the left upper lobe, consider CT chest with contrast for further evaluation. ? ?CTA of the chest PE: Was read as left upper lobe consolidation suggestive of infection/inflammation.  Underlying malignancy is not excluded.  Will need outpatient follow-up. ? ?ED treatment: Ceftriaxone 1 g, Protonix bolus and gtt., octreotide GGT, LR at 500 mL bolus. ? ?Patient quit smoking 8 months ago.Formerly, she smoked 2 packs/day. She is currently vaping nicotine daily. She endorses alcohol use, last drink was half a margarita the night of 10/27/2021. She is a former drug user including heroin, cocaine, crack, marijuana. ?She states that she is currently taking Suboxone and has been titrating the dose down. ? ?4/24: Patient did not had any bowel movements since came to the hospital.  No  nausea or vomiting.  Hemoglobin improved to 8.3 after getting 2 unit of PRBC.  EGD with large gastric and duodenal ulcers, no active bleeding.  No varices noted.  Octreotide was discontinued and GI is recommending continuation of Protonix infusion for another day and then transition to twice daily p.o.  Patient should not be on any NSAID. ? ?4/25: Hemoglobin continues to drift down, at 7.7 today.  Had 1 black color tarry stool this morning, discussed with Dr. Marius Ditch and she is recommending continuation of Protonix infusion for total of 72 hours.  If she continued to have melena or concern of GI bleed then they will repeat EGD as there was significant ulceration although no active bleed during prior EGD.  Biopsies were sent for H. Pylori. ?

## 2021-10-28 NOTE — Assessment & Plan Note (Signed)
-   Appears compensated at this time and not in acute exacerbation ?

## 2021-10-28 NOTE — Assessment & Plan Note (Addendum)
-   Presumed secondary to upper GI bleed, as large gastric and duodenal ulcers found on EGD with no active bleeding. ?- Hemoglobin improved to 8.3 s/p 2 unit PRBC.  No bowel movement since in the hospital. ?-Monitor hemoglobin ?-Avoid nephrotoxins ?

## 2021-10-28 NOTE — Assessment & Plan Note (Addendum)
-   Patient takes duloxetine 60 mg daily, Seroquel 25 mg daily, Lamictal 100 mg daily, this has been resumed ?- Ativan 1 mg IV every 6 hours as needed for anxiety, 2 doses ordered ?

## 2021-10-28 NOTE — Assessment & Plan Note (Addendum)
Blood pressure within goal ?- Per outpatient [facility] clinic note, in February 2023, patient takes metoprolol 100 mg daily, this has been resumed ?

## 2021-10-28 NOTE — Assessment & Plan Note (Addendum)
Most likely secondary to NSAID use.  EGD with large gastric and duodenal ulcers, no varices noted.  Patient received 2 unit of PRBC with improvement of hemoglobin to 8.3. ?-Octreotide was discontinued ?-Continue Protonix infusion for another day then switch to p.o. twice daily Protonix. ?-Avoid NSAID ?-Patient will need to have an outpatient GI follow-up for further recommendations ?-Monitor hemoglobin ?-Transfuse if below 7 ?

## 2021-10-28 NOTE — ED Notes (Signed)
Patient's husband Luretta Everly leaving bedside at this time. States he may be reached at [phone]. ?

## 2021-10-29 ENCOUNTER — Encounter
Admission: EM | Disposition: A | Discharge status: Home / Self Care | Payer: Self-pay | Source: Home / Self Care | Attending: Hospitalist

## 2021-10-29 ENCOUNTER — Observation Stay: Payer: Self-pay | Admitting: Anesthesiology

## 2021-10-29 ENCOUNTER — Other Ambulatory Visit: Payer: Self-pay

## 2021-10-29 DIAGNOSIS — K279 Peptic ulcer, site unspecified, unspecified as acute or chronic, without hemorrhage or perforation: Secondary | ICD-10-CM

## 2021-10-29 DIAGNOSIS — D62 Acute posthemorrhagic anemia: Secondary | ICD-10-CM

## 2021-10-29 DIAGNOSIS — K7460 Unspecified cirrhosis of liver: Secondary | ICD-10-CM

## 2021-10-29 HISTORY — PX: ESOPHAGOGASTRODUODENOSCOPY (EGD) WITH PROPOFOL: SHX5813

## 2021-10-29 LAB — IRON AND TIBC
Iron: 217 ug/dL — ABNORMAL HIGH (ref 28–170)
Saturation Ratios: 93 % — ABNORMAL HIGH (ref 10.4–31.8)
TIBC: 234 ug/dL — ABNORMAL LOW (ref 250–450)
UIBC: 17 ug/dL

## 2021-10-29 LAB — CBC
HCT: 25 % — ABNORMAL LOW (ref 36.0–46.0)
Hemoglobin: 8.3 g/dL — ABNORMAL LOW (ref 12.0–15.0)
MCH: 31.6 pg (ref 26.0–34.0)
MCHC: 33.2 g/dL (ref 30.0–36.0)
MCV: 95.1 fL (ref 80.0–100.0)
Platelets: 253 10*3/uL (ref 150–400)
RBC: 2.63 MIL/uL — ABNORMAL LOW (ref 3.87–5.11)
RDW: 15.3 % (ref 11.5–15.5)
WBC: 5.5 10*3/uL (ref 4.0–10.5)
nRBC: 0 % (ref 0.0–0.2)

## 2021-10-29 LAB — BASIC METABOLIC PANEL
Anion gap: 6 (ref 5–15)
BUN: 35 mg/dL — ABNORMAL HIGH (ref 8–23)
CO2: 27 mmol/L (ref 22–32)
Calcium: 8.7 mg/dL — ABNORMAL LOW (ref 8.9–10.3)
Chloride: 103 mmol/L (ref 98–111)
Creatinine, Ser: 0.79 mg/dL (ref 0.44–1.00)
GFR, Estimated: >60 mL/min (ref >60)
Glucose, Bld: 125 mg/dL — ABNORMAL HIGH (ref 70–99)
Potassium: 4.6 mmol/L (ref 3.5–5.1)
Sodium: 136 mmol/L (ref 135–145)

## 2021-10-29 LAB — VITAMIN B12: Vitamin B-12: 527 pg/mL (ref 180–914)

## 2021-10-29 LAB — FOLATE: Folate: 10.8 ng/mL (ref >5.9)

## 2021-10-29 LAB — FERRITIN: Ferritin: 136 ng/mL (ref 11–307)

## 2021-10-29 LAB — PROCALCITONIN
Procalcitonin: <0.1 ng/mL
Procalcitonin: <0.1 ng/mL

## 2021-10-29 SURGERY — ESOPHAGOGASTRODUODENOSCOPY (EGD) WITH PROPOFOL
Anesthesia: General

## 2021-10-29 MED ORDER — BUPRENORPHINE HCL-NALOXONE HCL 8-2 MG SL SUBL
1.0000 | SUBLINGUAL_TABLET | Freq: Every day | SUBLINGUAL | Status: DC
  Administered 2021-10-29 – 2021-11-01 (×4): 1 via SUBLINGUAL
  Filled 2021-10-29 (×4): qty 1

## 2021-10-29 MED ORDER — ENSURE ENLIVE PO LIQD
237.0000 mL | Freq: Two times a day (BID) | ORAL | Status: DC
  Administered 2021-10-29 – 2021-10-31 (×5): 237 mL via ORAL

## 2021-10-29 MED ORDER — LIDOCAINE HCL (CARDIAC) PF 100 MG/5ML IV SOSY
PREFILLED_SYRINGE | INTRAVENOUS | Status: DC | PRN
  Administered 2021-10-29: 50 mg via INTRAVENOUS

## 2021-10-29 MED ORDER — QUETIAPINE FUMARATE 25 MG PO TABS
25.0000 mg | ORAL_TABLET | Freq: Once | ORAL | Status: AC
  Administered 2021-10-29: 25 mg via ORAL
  Filled 2021-10-29: qty 1

## 2021-10-29 MED ORDER — SODIUM CHLORIDE 0.9 % IV SOLN
300.0000 mg | Freq: Once | INTRAVENOUS | Status: AC
  Administered 2021-10-29: 300 mg via INTRAVENOUS
  Filled 2021-10-29: qty 300

## 2021-10-29 MED ORDER — PROPOFOL 10 MG/ML IV BOLUS
INTRAVENOUS | Status: DC | PRN
  Administered 2021-10-29: 10 mg via INTRAVENOUS
  Administered 2021-10-29 (×2): 20 mg via INTRAVENOUS
  Administered 2021-10-29: 50 mg via INTRAVENOUS
  Administered 2021-10-29: 20 mg via INTRAVENOUS

## 2021-10-29 MED ORDER — SODIUM CHLORIDE 0.9 % IV SOLN: INTRAVENOUS | Status: DC

## 2021-10-29 NOTE — Progress Notes (Signed)
CBC recheck at 2300 on 10/28/21 cancelled as at the time of this note, RN states that only one unit PRBC has completed and patient will get a second unit.  ? ?Continue cbc with AM labs. ? ?Dr. Tobie Poet ?

## 2021-10-29 NOTE — Consult Note (Signed)
? ? ? ?Cephas Darby, MD ?[address]  ?[address]  ?[place], [place] [address]  ?Main: [phone]  ?Fax: [phone] ?Pager: [phone] ? ? Consultation ? ?Referring Provider:     No ref. provider found ?Primary Care Physician:  Juluis Pitch, MD ?Primary Gastroenterologist: Althia Forts       ?Reason for Consultation:     Melena ? ?Date of Admission:  10/28/2021 ?Date of Consultation:  10/29/2021 ?       ? HPI:   ?Regina Bernard is a 66 y.o. female with history of stage II breast cancer ER positive, in remission presented with 1 week history of generalized weakness and melena, patient was hypotensive upon arrival.  She also has history of cirrhosis for few years, unknown if patient has varices.  Patient's hemoglobin on arrival was 5.8, MCV 102.8, 6 months ago 10.4, normal platelets, elevated BUN/creatinine 41/0.97, mildly low albumin, otherwise normal LFTs, normal troponins, patient underwent CT abdomen and pelvis with without contrast early April 2023 for history of weight loss, was found to have cirrhosis of liver, emphysema.  She underwent CT angio PE protocol, negative for PE and possible left upper lobe consolidation, underlying malignancy could not be ruled out.  Patient responded to IV hydration, blood transfusions, hemoglobin 8.3 this morning, BUN/creatinine 35/0.79.  Normal procalcitonin levels.  Patient is n.p.o., started on pantoprazole drip, octreotide drip and ceftriaxone for SBP prophylaxis.  Patient has been hemodynamically stable and oxygenating well on 2 L nasal cannula. ? ?She does drink ETOH for several years ?NSAIDs: None ? ?Antiplts/Anticoagulants/Anti thrombotics: None ? ?GI Procedures: None ? ?Past Medical History:  ?Diagnosis Date  ? Abnormal CT scan, chest   ? Anxiety   ? Asthma   ? Breast cancer ([place])   ? Breast CA- Right   ? Breast cancer ([place]) 06/18/2011  ? right breast cancer  ? COPD (chronic obstructive pulmonary disease) ([place])   ? Endometriosis   ? tx with vaginal  hysterectomy  ? Fatty liver   ? Hepatitis C 1998  ? [facility] trial treatment in-patient study 2005. HCV 740, 05/20/11.  ? History of substance abuse ([place])   ? Heroine, cocaine, marijuana. States all of them. Heavy use 1995 to 2000. Occasional use before and after. None since 2006.   ? Hot flashes related to aromatase inhibitor therapy   ? Hypertension   ? Lung mass   ? Peripheral neuropathy   ? in hands and feet  ? ? ?Past Surgical History:  ?Procedure Laterality Date  ? BREAST LUMPECTOMY Right 2013  ? TUBAL LIGATION  1996  ? Upper right leg benign tumor removed    ? VAGINAL HYSTERECTOMY  2001  ? ? ?Current Facility-Administered Medications:  ?  0.9 %  sodium chloride infusion, 10 mL/hr, Intravenous, Once, Cox, Amy N, DO, Held at 10/28/21 1956 ?  acetaminophen (TYLENOL) tablet 650 mg, 650 mg, Oral, Q6H PRN **OR** acetaminophen (TYLENOL) suppository 650 mg, 650 mg, Rectal, Q6H PRN, Cox, Amy N, DO ?  albuterol (PROVENTIL) (2.5 MG/3ML) 0.083% nebulizer solution 2.5 mg, 2.5 mg, Nebulization, Q6H PRN, Cox, Amy N, DO ?  DULoxetine (CYMBALTA) DR capsule 30 mg, 30 mg, Oral, BID, Cox, Amy N, DO, 30 mg at 10/29/21 0806 ?  fluticasone furoate-vilanterol (BREO ELLIPTA) 100-25 MCG/ACT 1 puff, 1 puff, Inhalation, Daily, 1 puff at 10/29/21 0808 **AND** umeclidinium bromide (INCRUSE ELLIPTA) 62.5 MCG/ACT 1 puff, 1 puff, Inhalation, Daily, Cox, Amy N, DO, 1 puff at 10/29/21 0808 ?  lamoTRIgine (LAMICTAL) tablet  100 mg, 100 mg, Oral, Daily, Cox, Amy N, DO, 100 mg at 10/29/21 0806 ?  LORazepam (ATIVAN) injection 1 mg, 1 mg, Intravenous, Q6H PRN, Cox, Amy N, DO ?  melatonin tablet 5 mg, 5 mg, Oral, QHS PRN, Cox, Amy N, DO ?  metoprolol succinate (TOPROL-XL) 24 hr tablet 100 mg, 100 mg, Oral, Q breakfast, Cox, Amy N, DO, 100 mg at 10/29/21 0805 ?  midodrine (PROAMATINE) tablet 10 mg, 10 mg, Oral, PRN, Cox, Amy N, DO ?  multivitamin with minerals tablet 1 tablet, 1 tablet, Oral, Daily, Cox, Amy N, DO, 1 tablet at 10/29/21 0805 ?  [COMPLETED]  octreotide (SANDOSTATIN) 2 mcg/mL load via infusion 50 mcg, 50 mcg, Intravenous, Once, 50 mcg at 10/28/21 2124 **AND** octreotide (SANDOSTATIN) 500 mcg in sodium chloride 0.9 % 250 mL (2 mcg/mL) infusion, 50 mcg/hr, Intravenous, Continuous, Cox, Amy N, DO, Last Rate: 25 mL/hr at 10/29/21 0816, 50 mcg/hr at 10/29/21 0816 ?  ondansetron (ZOFRAN) tablet 4 mg, 4 mg, Oral, Q6H PRN **OR** ondansetron (ZOFRAN) injection 4 mg, 4 mg, Intravenous, Q6H PRN, Cox, Amy N, DO ?  [START ON 11/01/2021] pantoprazole (PROTONIX) injection 40 mg, 40 mg, Intravenous, Q12H, Cox, Amy N, DO ?  pantoprozole (PROTONIX) 80 mg /NS 100 mL infusion, 8 mg/hr, Intravenous, Continuous, Cox, Amy N, DO, Last Rate: 10 mL/hr at 10/29/21 0815, 8 mg/hr at 10/29/21 0815 ?  QUEtiapine (SEROQUEL) tablet 25 mg, 25 mg, Oral, Daily, Cox, Amy N, DO, 25 mg at 10/29/21 0806 ?0.6 ? ? ?Current Facility-Administered Medications:  ?  0.9 %  sodium chloride infusion, 10 mL/hr, Intravenous, Once, Cox, Amy N, DO, Held at 10/28/21 1956 ?  acetaminophen (TYLENOL) tablet 650 mg, 650 mg, Oral, Q6H PRN **OR** acetaminophen (TYLENOL) suppository 650 mg, 650 mg, Rectal, Q6H PRN, Cox, Amy N, DO ?  albuterol (PROVENTIL) (2.5 MG/3ML) 0.083% nebulizer solution 2.5 mg, 2.5 mg, Nebulization, Q6H PRN, Cox, Amy N, DO ?  DULoxetine (CYMBALTA) DR capsule 30 mg, 30 mg, Oral, BID, Cox, Amy N, DO, 30 mg at 10/29/21 0806 ?  fluticasone furoate-vilanterol (BREO ELLIPTA) 100-25 MCG/ACT 1 puff, 1 puff, Inhalation, Daily, 1 puff at 10/29/21 0808 **AND** umeclidinium bromide (INCRUSE ELLIPTA) 62.5 MCG/ACT 1 puff, 1 puff, Inhalation, Daily, Cox, Amy N, DO, 1 puff at 10/29/21 0808 ?  lamoTRIgine (LAMICTAL) tablet 100 mg, 100 mg, Oral, Daily, Cox, Amy N, DO, 100 mg at 10/29/21 0806 ?  LORazepam (ATIVAN) injection 1 mg, 1 mg, Intravenous, Q6H PRN, Cox, Amy N, DO ?  melatonin tablet 5 mg, 5 mg, Oral, QHS PRN, Cox, Amy N, DO ?  metoprolol succinate (TOPROL-XL) 24 hr tablet 100 mg, 100 mg, Oral, Q  breakfast, Cox, Amy N, DO, 100 mg at 10/29/21 0805 ?  midodrine (PROAMATINE) tablet 10 mg, 10 mg, Oral, PRN, Cox, Amy N, DO ?  multivitamin with minerals tablet 1 tablet, 1 tablet, Oral, Daily, Cox, Amy N, DO, 1 tablet at 10/29/21 0805 ?  [COMPLETED] octreotide (SANDOSTATIN) 2 mcg/mL load via infusion 50 mcg, 50 mcg, Intravenous, Once, 50 mcg at 10/28/21 2124 **AND** octreotide (SANDOSTATIN) 500 mcg in sodium chloride 0.9 % 250 mL (2 mcg/mL) infusion, 50 mcg/hr, Intravenous, Continuous, Cox, Amy N, DO, Last Rate: 25 mL/hr at 10/29/21 0816, 50 mcg/hr at 10/29/21 0816 ?  ondansetron (ZOFRAN) tablet 4 mg, 4 mg, Oral, Q6H PRN **OR** ondansetron (ZOFRAN) injection 4 mg, 4 mg, Intravenous, Q6H PRN, Cox, Amy N, DO ?  [START ON 11/01/2021] pantoprazole (PROTONIX) injection 40 mg, 40  mg, Intravenous, Q12H, Cox, Amy N, DO ?  pantoprozole (PROTONIX) 80 mg /NS 100 mL infusion, 8 mg/hr, Intravenous, Continuous, Cox, Amy N, DO, Last Rate: 10 mL/hr at 10/29/21 0815, 8 mg/hr at 10/29/21 0815 ?  QUEtiapine (SEROQUEL) tablet 25 mg, 25 mg, Oral, Daily, Cox, Amy N, DO, 25 mg at 10/29/21 0806 ? ?Family History  ?Problem Relation Age of Onset  ? Diabetes Maternal Grandmother   ? Lung cancer Father   ? Lung cancer Mother   ? Hypertension Mother   ? Heart disease Mother   ? Skin cancer Mother   ? Breast cancer Neg Hx   ?  ? ?Social History  ? ?Tobacco Use  ? Smoking status: Every Day  ?  Packs/day: 0.25  ?  Years: 44.00  ?  Pack years: 11.00  ?  Types: Cigarettes  ?  Last attempt to quit: 03/21/2016  ?  Years since quitting: 5.6  ? Smokeless tobacco: Never  ? Tobacco comments:  ?  last cigeratte use 6 days ago (11/30/15)  ?Vaping Use  ? Vaping Use: Never used  ?Substance Use Topics  ? Alcohol use: Yes  ?  Alcohol/week: 0.0 standard drinks  ? Drug use: No  ? ? ?Allergies as of 10/28/2021 - Review Complete 10/28/2021  ?Allergen Reaction Noted  ? Penicillins Swelling and Rash 01/17/2015  ? ? ?Review of Systems:    ?All systems reviewed and  negative except where noted in HPI. ? ? Physical Exam:  ?Vital signs in last 24 hours: ?Temp:  [97.5 ?F (36.4 ?C)-98.5 ?F (36.9 ?C)] 97.8 ?F (36.6 ?C) (04/24 0745) ?Pulse Rate:  [64-88] 64 (04/24 0800) ?Resp:  [12-21]

## 2021-10-29 NOTE — Op Note (Signed)
[facility] ?Gastroenterology ?Patient Name: Regina Bernard ?Procedure Date: 10/29/2021 10:57 AM ?MRN: [id number] ?Account #: [id number] ?Date of Birth: 30-Apr-1956 ?Admit Type: Outpatient ?Age: 66 ?Room: [facility] ENDO ROOM 1 ?Gender: Female ?Note Status: Finalized ?Instrument Name: Upper Endoscope [id number] ?Procedure:             Upper GI endoscopy ?Indications:           Acute post hemorrhagic anemia, Melena ?Providers:             [name] MD, MD ?Medicines:             General Anesthesia ?Complications:         No immediate complications. Estimated blood loss: None. ?Procedure:             Pre-Anesthesia Assessment: ?                       - Prior to the procedure, a History and Physical was  ?                       performed, and patient medications and allergies were  ?                       reviewed. The patient is competent. The risks and  ?                       benefits of the procedure and the sedation options and  ?                       risks were discussed with the patient. All questions  ?                       were answered and informed consent was obtained.  ?                       Patient identification and proposed procedure were  ?                       verified by the physician, the nurse, the  ?                       anesthesiologist, the anesthetist and the technician  ?                       in the pre-procedure area in the procedure room in the  ?                       endoscopy suite. Mental Status Examination: alert and  ?                       oriented. Airway Examination: normal oropharyngeal  ?                       airway and neck mobility. Respiratory Examination:  ?                       clear to auscultation. CV Examination: normal.  ?  Prophylactic Antibiotics: The patient does not require  ?                       prophylactic antibiotics. Prior Anticoagulants: The  ?                       patient has taken no previous anticoagulant or  ?                        antiplatelet agents. ASA Grade Assessment: III - A  ?                       patient with severe systemic disease. After reviewing  ?                       the risks and benefits, the patient was deemed in  ?                       satisfactory condition to undergo the procedure. The  ?                       anesthesia plan was to use general anesthesia.  ?                       Immediately prior to administration of medications,  ?                       the patient was re-assessed for adequacy to receive  ?                       sedatives. The heart rate, respiratory rate, oxygen  ?                       saturations, blood pressure, adequacy of pulmonary  ?                       ventilation, and response to care were monitored  ?                       throughout the procedure. The physical status of the  ?                       patient was re-assessed after the procedure. ?                       After obtaining informed consent, the endoscope was  ?                       passed under direct vision. Throughout the procedure,  ?                       the patient's blood pressure, pulse, and oxygen  ?                       saturations were monitored continuously. The Endoscope  ?                       was introduced through the mouth, and advanced to the  ?  second part of duodenum. The upper GI endoscopy was  ?                       accomplished without difficulty. The patient tolerated  ?                       the procedure well. ?Findings: ?     One non-bleeding cratered duodenal ulcer with a clean ulcer base  ?     (Forrest Class III) was found in the duodenal bulb. The lesion was 10 mm  ?     in largest dimension. ?     The second portion of the duodenum was normal. ?     One non-bleeding cratered gastric ulcer with a clean ulcer base (Forrest  ?     Class III) was found on the greater curvature of the gastric antrum. The  ?     lesion was 12 mm in largest dimension. ?      Multiple dispersed 10 mm erosions with no bleeding and no stigmata of  ?     recent bleeding were found in the gastric antrum and in the prepyloric  ?     region of the stomach. Biopsies were taken with a cold forceps for  ?     Helicobacter pylori testing. ?     The cardia and gastric fundus were normal on retroflexion. ?     The gastroesophageal junction and examined esophagus were normal. ?     Esophagogastric landmarks were identified: the gastroesophageal junction  ?     was found at 35 cm from the incisors. ?Impression:            - Non-bleeding duodenal ulcer with a clean ulcer base  ?                       (Forrest Class III). ?                       - Normal second portion of the duodenum. ?                       - Non-bleeding gastric ulcer with a clean ulcer base  ?                       (Forrest Class III). ?                       - Erosive gastropathy with no bleeding and no stigmata  ?                       of recent bleeding. Biopsied. ?                       - Normal gastroesophageal junction and esophagus. ?                       - Esophagogastric landmarks identified. ?Recommendation:        - Return patient to hospital ward for ongoing care. ?                       - Advance diet as tolerated today. ?                       -  Continue present medications. ?                       - Use Prilosec (omeprazole) 40 mg PO BID indefinitely. ?                       - Repeat upper endoscopy in 3 months to check healing. ?Procedure Code(s):     --- Professional --- ?                       [phone], Esophagogastroduodenoscopy, flexible,  ?                       transoral; with biopsy, single or multiple ?Diagnosis Code(s):     --- Professional --- ?                       K26.9, Duodenal ulcer, unspecified as acute or  ?                       chronic, without hemorrhage or perforation ?                       K25.9, Gastric ulcer, unspecified as acute or chronic,  ?                       without hemorrhage or  perforation ?                       K31.89, Other diseases of stomach and duodenum ?                       D62, Acute posthemorrhagic anemia ?                       K92.1, Melena (includes Hematochezia) ?CPT copyright 2019 American Medical Association. All rights reserved. ?The codes documented in this report are preliminary and upon coder review may  ?be revised to meet current compliance requirements. ?Dr. Ulyess Mort ?[name] MD, MD ?10/29/2021 11:29:29 AM ?This report has been signed electronically. ?Number of Addenda: 0 ?Note Initiated On: 10/29/2021 10:57 AM ?Estimated Blood Loss:  Estimated blood loss: none. ?     [facility] ?

## 2021-10-29 NOTE — Transfer of Care (Signed)
Immediate Anesthesia Transfer of Care Note ? ?Patient: Regina Bernard ? ?Procedure(s) Performed: ESOPHAGOGASTRODUODENOSCOPY (EGD) WITH PROPOFOL ? ?Patient Location: PACU and Endoscopy Unit ? ?Anesthesia Type:General ? ?Level of Consciousness: drowsy ? ?Airway & Oxygen Therapy: Patient Spontanous Breathing ? ?Post-op Assessment: Report given to RN and Post -op Vital signs reviewed and stable ? ?Post vital signs: Reviewed and stable ? ?Last Vitals:  ?Vitals Value Taken Time  ?BP 95/56 10/29/21 1130  ?Temp    ?Pulse 64 10/29/21 1132  ?Resp 17 10/29/21 1132  ?SpO2 100 % 10/29/21 1132  ?Vitals shown include unvalidated device data. ? ?Last Pain:  ?Vitals:  ? 10/29/21 1000  ?TempSrc: Temporal  ?PainSc: 0-No pain  ?   ? ?  ? ?Complications: No notable events documented. ?

## 2021-10-29 NOTE — Plan of Care (Signed)
?  Problem: Education: ?Goal: Knowledge of General Education information will improve ?Description: Including pain rating scale, medication(s)/side effects and non-pharmacologic comfort measures ?Outcome: Progressing ?  ?Problem: Clinical Measurements: ?Goal: Ability to maintain clinical measurements within normal limits will improve ?Outcome: Progressing ?Goal: Diagnostic test results will improve ?Outcome: Progressing ?Goal: Respiratory complications will improve ?Outcome: Progressing ?  ?Problem: Activity: ?Goal: Risk for activity intolerance will decrease ?Outcome: Progressing ?  ?Problem: Nutrition: ?Goal: Adequate nutrition will be maintained ?Outcome: Progressing ?  ?Problem: Coping: ?Goal: Level of anxiety will decrease ?Outcome: Progressing ?  ?Problem: Pain Managment: ?Goal: General experience of comfort will improve ?Outcome: Progressing ?  ?

## 2021-10-29 NOTE — Plan of Care (Signed)
?  Problem: Safety: ?Goal: Ability to remain free from injury will improve ?Outcome: Progressing ?  ?Problem: Skin Integrity: ?Goal: Risk for impaired skin integrity will decrease ?Outcome: Progressing ?  ?Problem: Education: ?Goal: Ability to identify signs and symptoms of gastrointestinal bleeding will improve ?Outcome: Progressing ?  ?

## 2021-10-29 NOTE — Anesthesia Postprocedure Evaluation (Signed)
Anesthesia Post Note ? ?Patient: Regina Bernard ? ?Procedure(s) Performed: ESOPHAGOGASTRODUODENOSCOPY (EGD) WITH PROPOFOL ? ?Patient location during evaluation: Endoscopy ?Anesthesia Type: General ?Level of consciousness: awake and alert ?Pain management: pain level controlled ?Vital Signs Assessment: post-procedure vital signs reviewed and stable ?Respiratory status: spontaneous breathing, nonlabored ventilation, respiratory function stable and patient connected to nasal cannula oxygen ?Cardiovascular status: blood pressure returned to baseline and stable ?Postop Assessment: no apparent nausea or vomiting ?Anesthetic complications: no ? ? ?No notable events documented. ? ? ?Last Vitals:  ?Vitals:  ? 10/29/21 1150 10/29/21 1200  ?BP: 131/66 (!) 125/58  ?Pulse: 61 62  ?Resp: 14   ?Temp:    ?SpO2: 100% 100%  ?  ?Last Pain:  ?Vitals:  ? 10/29/21 1130  ?TempSrc: Temporal  ?PainSc:   ? ? ?  ?  ?  ?  ?  ?  ? ?Arita Miss ? ? ? ? ?

## 2021-10-29 NOTE — Anesthesia Preprocedure Evaluation (Signed)
Anesthesia Evaluation  ?Patient identified by MRN, date of birth, ID bandGeneral Assessment Comment:Patient very lethargic (she had received Seroquel on the floor earlier today). Able to be aroused with constant gentle shaking, but she keeps nodding off.  ?I believe I was able to explain to her the anesthetic and obtain informed consent. ? ?Reviewed: ?Allergy & Precautions, NPO status , Patient's Chart, lab work & pertinent test results ? ?History of Anesthesia Complications ?Negative for: history of anesthetic complications ? ?Airway ?Mallampati: III ? ?TM Distance: >3 FB ?Neck ROM: Full ? ? ? Dental ? ?(+) Teeth Intact, Implants ?  ?Pulmonary ?asthma , neg sleep apnea, COPD, Current Smoker and Patient abstained from smoking.,  ?  ?Pulmonary exam normal ?breath sounds clear to auscultation ? ? ? ? ? ? Cardiovascular ?Exercise Tolerance: Good ?METShypertension, Pt. on medications ?(-) CAD and (-) Past MI (-) dysrhythmias  ?Rhythm:Regular Rate:Normal ?- Systolic murmurs ? ?  ?Neuro/Psych ?PSYCHIATRIC DISORDERS Anxiety negative neurological ROS ?   ? GI/Hepatic ?neg GERD  ,(+) Cirrhosis  ?  ? (-) substance abuse ? , Hepatitis -, C  ?Endo/Other  ?neg diabetes ? Renal/GU ?negative Renal ROS  ? ?  ?Musculoskeletal ? ? Abdominal ?  ?Peds ? Hematology ? ?(+) Blood dyscrasia, anemia ,   ?Anesthesia Other Findings ?Past Medical History: ?No date: Abnormal CT scan, chest ?No date: Anxiety ?No date: Asthma ?No date: Breast cancer ([place]) ?    Comment:  Breast CA- Right  ?06/18/2011: Breast cancer ([place]) ?    Comment:  right breast cancer ?No date: COPD (chronic obstructive pulmonary disease) ([place]) ?No date: Endometriosis ?    Comment:  tx with vaginal hysterectomy ?No date: Fatty liver ?1998: Hepatitis C ?    Comment:  [facility] trial treatment in-patient study 2005. HCV 740,  ?             05/20/11. ?No date: History of substance abuse ([place]) ?    Comment:  Heroine, cocaine, marijuana. States all of  them. Heavy  ?             use 1995 to 2000. Occasional use before and after. None  ?             since 2006.  ?No date: Hot flashes related to aromatase inhibitor therapy ?No date: Hypertension ?No date: Lung mass ?No date: Peripheral neuropathy ?    Comment:  in hands and feet ? Reproductive/Obstetrics ? ?  ? ? ? ? ? ? ? ? ? ? ? ? ? ?  ?  ? ? ? ? ? ? ? ? ?Anesthesia Physical ?Anesthesia Plan ? ?ASA: 3 ? ?Anesthesia Plan: General  ? ?Post-op Pain Management: Minimal or no pain anticipated  ? ?Induction: Intravenous ? ?PONV Risk Score and Plan: 3 and Propofol infusion, TIVA and Ondansetron ? ?Airway Management Planned: Nasal Cannula ? ?Additional Equipment: None ? ?Intra-op Plan:  ? ?Post-operative Plan:  ? ?Informed Consent: I have reviewed the patients History and Physical, chart, labs and discussed the procedure including the risks, benefits and alternatives for the proposed anesthesia with the patient or authorized representative who has indicated his/her understanding and acceptance.  ? ? ? ?Dental advisory given ? ?Plan Discussed with: CRNA and Surgeon ? ?Anesthesia Plan Comments: (Discussed risks of anesthesia with patient, including possibility of difficulty with spontaneous ventilation under anesthesia necessitating airway intervention, PONV, and rare risks such as cardiac or respiratory or neurological events, and allergic reactions. Discussed the role of CRNA in patient's  perioperative care. Patient understands.)  ? ? ? ? ? ? ?Anesthesia Quick Evaluation ? ?

## 2021-10-29 NOTE — Progress Notes (Signed)
?Progress Note ? ? ?Patient: Regina Bernard [id number]:[id number] DOB: 03-27-1956 DOA: 10/28/2021     0 ?DOS: the patient was seen and examined on 10/29/2021 ?  ?Brief hospital course: ?[name] is a 66 year old female with severe COPD on chronic SPO2 is 2 L, depression, anxiety, hypertension, breast cancer, history of liver cirrhosis without noted varices, who presents emergency department for chief concerns of weakness, general malaise, and dark stools for 1 week. ? She also endorses that over the last 3 weeks she has associated shortness of breath with exertion.  She states she is never felt this way before.  She states that she has chronic back pain and takes ibuprofen and Tylenol handfuls at a time daily.  She states she has been doing this for about 6 months.   ? ?Initial vitals in the emergency department showed temperature of 97.5, respiration rate of 17, heart rate 77, blood pressure 92/53, SPO2 of 100% on 2 L nasal cannula. ? ?Serum sodium is 134, potassium 4.3, chloride 100, bicarb 25, BUN of 41, serum creatinine of 0.97, GFR greater than 60, nonfasting blood glucose 137, WBC 6.1, hemoglobin 5.8, platelets of 355. ? ?BNP was 151.2.  High sensitive troponin was 8. ? ?Portable chest x-ray: Was read as patchy upper lobe opacity, new from prior, possibly reflecting infection/pneumonia.  Given masslike appearance of the left upper lobe, consider CT chest with contrast for further evaluation. ? ?CTA of the chest PE: Was read as left upper lobe consolidation suggestive of infection/inflammation.  Underlying malignancy is not excluded.  Will need outpatient follow-up. ? ?ED treatment: Ceftriaxone 1 g, Protonix bolus and gtt., octreotide GGT, LR at 500 mL bolus. ? ?Patient quit smoking 8 months ago.Formerly, she smoked 2 packs/day. She is currently vaping nicotine daily. She endorses alcohol use, last drink was half a margarita the night of 10/27/2021. She is a former drug user including heroin, cocaine, crack,  marijuana. ?She states that she is currently taking Suboxone and has been titrating the dose down. ? ?4/24: Patient did not had any bowel movements since came to the hospital.  No nausea or vomiting.  Hemoglobin improved to 8.3 after getting 2 unit of PRBC.  EGD with large gastric and duodenal ulcers, no active bleeding.  No varices noted.  Octreotide was discontinued and GI is recommending continuation of Protonix infusion for another day and then transition to twice daily p.o.  Patient should not be on any NSAID. ? ? ?Assessment and Plan: ?* Upper GI bleed ?Most likely secondary to NSAID use.  EGD with large gastric and duodenal ulcers, no varices noted.  Patient received 2 unit of PRBC with improvement of hemoglobin to 8.3. ?-Octreotide was discontinued ?-Continue Protonix infusion for another day then switch to p.o. twice daily Protonix. ?-Avoid NSAID ?-Patient will need to have an outpatient GI follow-up for further recommendations ?-Monitor hemoglobin ?-Transfuse if below 7 ? ?Anemia due to acute blood loss ?- Presumed secondary to upper GI bleed, as large gastric and duodenal ulcers found on EGD with no active bleeding. ?- Hemoglobin improved to 8.3 s/p 2 unit PRBC.  No bowel movement since in the hospital. ?-Monitor hemoglobin ?-Avoid nephrotoxins ? ?Liver cirrhosis ([place]) ?- Appears compensated at this time and not in acute exacerbation ? ?Severe chronic obstructive pulmonary disease ([place]) ?- Baseline oxygen supplementation requirement, approximately 2 L nasal cannula ?- At Her baseline now ?-Continue to monitor ?-Continue home inhalers ? ?Carcinoma of overlapping sites of right breast in female, estrogen  receptor positive ([place]) ?- Status postchemotherapy, radiation, partial mastectomy with lymph node removal on the right side ?- Continue follow-up with outpatient oncology provider recommended ?-CT chest is also concerning for left upper lobe consolidation/underlying malignancy, needs to be followed up as an  outpatient ? ?Generalized anxiety disorder ?- Patient takes duloxetine 60 mg daily, Seroquel 25 mg daily, Lamictal 100 mg daily, this has been resumed ?- Ativan 1 mg IV every 6 hours as needed for anxiety, 2 doses ordered ? ?Essential hypertension ?Blood pressure within goal ?- Per outpatient [facility] clinic note, in February 2023, patient takes metoprolol 100 mg daily, this has been resumed ? ? ?Subjective: Patient was seen and examined today.  She was feeling little tired and waiting for endoscopy.  No bowel movement since in the hospital.  No nausea or vomiting ? ?Physical Exam: ?Vitals:  ? 10/29/21 1130 10/29/21 1140 10/29/21 1150 10/29/21 1200  ?BP: (!) 95/56 112/60 131/66 (!) 125/58  ?Pulse: 65 (!) 59 61 62  ?Resp: '13 16 14   '$ ?Temp: (!) 97.2 ?F (36.2 ?C)     ?TempSrc: Temporal     ?SpO2: 100% 100% 100% 100%  ?Weight:      ?Height:      ? ?General.  Malnourished lady, in no acute distress. ?Pulmonary.  Lungs clear bilaterally, normal respiratory effort. ?CV.  Regular rate and rhythm, no JVD, rub or murmur. ?Abdomen.  Soft, nontender, nondistended, BS positive. ?CNS.  Alert and oriented .  No focal neurologic deficit. ?Extremities.  No edema, no cyanosis, pulses intact and symmetrical. ?Psychiatry.  Judgment and insight appears normal. ? ?Data Reviewed: ?Prior notes, labs and images reviewed ? ?Family Communication:  ? ?Disposition: ?Status is: Inpatient ?Remains inpatient appropriate because: Severity of illness ? ? Planned Discharge Destination: Home ? ?DVT prophylaxis.  SCDs ?Time spent: 44 minutes ? ?This record has been created using Systems analyst. Errors have been sought and corrected,but may not always be located. Such creation errors do not reflect on the standard of care. ? ?Author: ?Lorella Nimrod, MD ?10/29/2021 12:31 PM ? ?For on call review [email or web address].  ?

## 2021-10-30 ENCOUNTER — Encounter: Payer: Self-pay | Admitting: Gastroenterology

## 2021-10-30 LAB — TYPE AND SCREEN
ABO/RH(D): A POS
Antibody Screen: NEGATIVE
Unit division: 0
Unit division: 0

## 2021-10-30 LAB — BPAM RBC
Blood Product Expiration Date: 202305202359
Blood Product Expiration Date: 202305202359
ISSUE DATE / TIME: 202304232140
ISSUE DATE / TIME: 202304240123
Unit Type and Rh: 6200
Unit Type and Rh: 6200

## 2021-10-30 LAB — CBC
HCT: 23.2 % — ABNORMAL LOW (ref 36.0–46.0)
Hemoglobin: 7.7 g/dL — ABNORMAL LOW (ref 12.0–15.0)
MCH: 33.2 pg (ref 26.0–34.0)
MCHC: 33.2 g/dL (ref 30.0–36.0)
MCV: 100 fL (ref 80.0–100.0)
Platelets: 235 10*3/uL (ref 150–400)
RBC: 2.32 MIL/uL — ABNORMAL LOW (ref 3.87–5.11)
RDW: 15.9 % — ABNORMAL HIGH (ref 11.5–15.5)
WBC: 5.4 10*3/uL (ref 4.0–10.5)
nRBC: 0 % (ref 0.0–0.2)

## 2021-10-30 LAB — PROCALCITONIN: Procalcitonin: <0.1 ng/mL

## 2021-10-30 LAB — HIV ANTIBODY (ROUTINE TESTING W REFLEX): HIV Screen 4th Generation wRfx: NONREACTIVE

## 2021-10-30 NOTE — Progress Notes (Signed)
Reminded pt the need to get out of bed. Patient refuse to get out of bed to sit in recliner. ?

## 2021-10-30 NOTE — Plan of Care (Signed)

## 2021-10-30 NOTE — Progress Notes (Signed)
?Progress Note ? ? ?Patient: Regina Bernard YQM:[id number] DOB: 1956-05-23 DOA: 10/28/2021     1 ?DOS: the patient was seen and examined on 10/30/2021 ?  ?Brief hospital course: ?[name] is a 66 year old female with severe COPD on chronic SPO2 is 2 L, depression, anxiety, hypertension, breast cancer, history of liver cirrhosis without noted varices, who presents emergency department for chief concerns of weakness, general malaise, and dark stools for 1 week. ? She also endorses that over the last 3 weeks she has associated shortness of breath with exertion.  She states she is never felt this way before.  She states that she has chronic back pain and takes ibuprofen and Tylenol handfuls at a time daily.  She states she has been doing this for about 6 months.   ? ?Initial vitals in the emergency department showed temperature of 97.5, respiration rate of 17, heart rate 77, blood pressure 92/53, SPO2 of 100% on 2 L nasal cannula. ? ?Serum sodium is 134, potassium 4.3, chloride 100, bicarb 25, BUN of 41, serum creatinine of 0.97, GFR greater than 60, nonfasting blood glucose 137, WBC 6.1, hemoglobin 5.8, platelets of 355. ? ?BNP was 151.2.  High sensitive troponin was 8. ? ?Portable chest x-ray: Was read as patchy upper lobe opacity, new from prior, possibly reflecting infection/pneumonia.  Given masslike appearance of the left upper lobe, consider CT chest with contrast for further evaluation. ? ?CTA of the chest PE: Was read as left upper lobe consolidation suggestive of infection/inflammation.  Underlying malignancy is not excluded.  Will need outpatient follow-up. ? ?ED treatment: Ceftriaxone 1 g, Protonix bolus and gtt., octreotide GGT, LR at 500 mL bolus. ? ?Patient quit smoking 8 months ago.Formerly, she smoked 2 packs/day. She is currently vaping nicotine daily. She endorses alcohol use, last drink was half a margarita the night of 10/27/2021. She is a former drug user including heroin, cocaine, crack,  marijuana. ?She states that she is currently taking Suboxone and has been titrating the dose down. ? ?4/24: Patient did not had any bowel movements since came to the hospital.  No nausea or vomiting.  Hemoglobin improved to 8.3 after getting 2 unit of PRBC.  EGD with large gastric and duodenal ulcers, no active bleeding.  No varices noted.  Octreotide was discontinued and GI is recommending continuation of Protonix infusion for another day and then transition to twice daily p.o.  Patient should not be on any NSAID. ? ?4/25: Hemoglobin continues to drift down, at 7.7 today.  Had 1 black color tarry stool this morning, discussed with Dr. Marius Ditch and she is recommending continuation of Protonix infusion for total of 72 hours.  If she continued to have melena or concern of GI bleed then they will repeat EGD as there was significant ulceration although no active bleed during prior EGD.  Biopsies were sent for H. Pylori. ? ? ?Assessment and Plan: ?* Upper GI bleed ?Most likely secondary to NSAID use.  EGD with large gastric and duodenal ulcers, no varices noted.  Patient received 2 unit of PRBC with improvement of hemoglobin to 8.3>>7.7.  Had 1 melanotic stool this morning. ?-Octreotide was discontinued ?-Continue Protonix infusion for another day then switch to p.o. twice daily Protonix. ?-Might need a repeat EGD if continue to bleed ?-Avoid NSAID ?-Patient will need to have an outpatient GI follow-up for further recommendations ?-Monitor hemoglobin ?-Transfuse if below 7 ? ?Anemia due to acute blood loss ?- Presumed secondary to upper GI bleed,  as large gastric and duodenal ulcers found on EGD with no active bleeding. ?- Hemoglobin improved to 8.3>>7.7 s/p 2 unit PRBC.  No bowel movement since in the hospital. ?-Monitor hemoglobin ?-Transfuse if below 7 ? ?Liver cirrhosis ([place]) ?- Appears compensated at this time and not in acute exacerbation ? ?Severe chronic obstructive pulmonary disease ([place]) ?- Baseline oxygen  supplementation requirement, approximately 2 L nasal cannula ?- At Her baseline now ?-Continue to monitor ?-Continue home inhalers ? ?Carcinoma of overlapping sites of right breast in female, estrogen receptor positive ([place]) ?- Status postchemotherapy, radiation, partial mastectomy with lymph node removal on the right side ?- Continue follow-up with outpatient oncology provider recommended ?-CT chest is also concerning for left upper lobe consolidation/underlying malignancy, needs to be followed up as an outpatient ? ?Generalized anxiety disorder ?- Patient takes duloxetine 60 mg daily, Seroquel 25 mg daily, Lamictal 100 mg daily, this has been resumed ?- Ativan 1 mg IV every 6 hours as needed for anxiety, 2 doses ordered ? ?Essential hypertension ?Blood pressure within goal ?- Per outpatient [facility] clinic note, in February 2023, patient takes metoprolol 100 mg daily, this has been resumed ? ? ?Subjective: Patient had 1 black-colored stool this morning.  Continued to feel very weak.  Denies any pain. ? ?Physical Exam: ?Vitals:  ? 10/30/21 0400 10/30/21 0757 10/30/21 0800 10/30/21 1131  ?BP: 130/73 102/62 123/61 134/67  ?Pulse: (!) 57 63  61  ?Resp: '15 19  10  '$ ?Temp: 97.6 ?F (36.4 ?C) 98 ?F (36.7 ?C)  (!) 97.5 ?F (36.4 ?C)  ?TempSrc: Oral Oral  Oral  ?SpO2: 100% 100%  100%  ?Weight:      ?Height:      ? ?General.  Malnourished lady, in no acute distress. ?Pulmonary.  Lungs clear bilaterally, normal respiratory effort. ?CV.  Regular rate and rhythm, no JVD, rub or murmur. ?Abdomen.  Soft, nontender, nondistended, BS positive. ?CNS.  Alert and oriented .  No focal neurologic deficit. ?Extremities.  No edema, no cyanosis, pulses intact and symmetrical. ?Psychiatry.  Judgment and insight appears normal. ? ?Data Reviewed: ?Prior notes and labs reviewed ? ?Family Communication: Discussed with patient ? ?Disposition: ?Status is: Inpatient ?Remains inpatient appropriate because: Severity of illness ? ? Planned Discharge  Destination: Home ? ?Time spent: 45 minutes ? ?This record has been created using Systems analyst. Errors have been sought and corrected,but may not always be located. Such creation errors do not reflect on the standard of care. ? ?Author: ?Lorella Nimrod, MD ?10/30/2021 3:18 PM ? ?For on call review [email or web address].  ?

## 2021-10-30 NOTE — Progress Notes (Signed)
?Regina Lame, MD [facility]   ?[address] ?[place], [place] [address] ?Phone: [phone] ?Fax : [phone] ? ? ?Subjective: ?Patient reports that she still feels weak.  The patient's hemoglobin went down slightly today and her upper endoscopy showed multiple ulcers with gastritis.  These findings were likely due to her NSAID use.  There is no active bleeding seen on the upper endoscopy. ? ? ?Objective: ?Vital signs in last 24 hours: ?Vitals:  ? 10/30/21 0400 10/30/21 0757 10/30/21 0800 10/30/21 1131  ?BP: 130/73 102/62 123/61 134/67  ?Pulse: (!) 57 63  61  ?Resp: '15 19  10  '$ ?Temp: 97.6 ?F (36.4 ?C) 98 ?F (36.7 ?C)  (!) 97.5 ?F (36.4 ?C)  ?TempSrc: Oral Oral  Oral  ?SpO2: 100% 100%  100%  ?Weight:      ?Height:      ? ?Weight change: 0.008 kg ? ?Intake/Output Summary (Last 24 hours) at 10/30/2021 1338 ?Last data filed at 10/30/2021 1132 ?Gross per 24 hour  ?Intake 1142.56 ml  ?Output 700 ml  ?Net 442.56 ml  ? ? ? ?Exam: ?General: Patient feeling tired and weak in no apparent distress ?Neuro: Alert and orientated x3 nonfocal ? ? ?Lab Results: ?'@LABTEST2'$ @ ?Micro Results: ?Recent Results (from the past 240 hour(s))  ?Resp Panel by RT-PCR (Flu A&B, Covid) Nasopharyngeal Swab     Status: None  ? Collection Time: 10/28/21  7:13 PM  ? Specimen: Nasopharyngeal Swab; Nasopharyngeal(NP) swabs in vial transport medium  ?Result Value Ref Range Status  ? SARS Coronavirus 2 by RT PCR NEGATIVE NEGATIVE Final  ?  Comment: (NOTE) ?SARS-CoV-2 target nucleic acids are NOT DETECTED. ? ?The SARS-CoV-2 RNA is generally detectable in upper respiratory ?specimens during the acute phase of infection. The lowest ?concentration of SARS-CoV-2 viral copies this assay can detect is ?138 copies/mL. A negative result does not preclude SARS-Cov-2 ?infection and should not be used as the sole basis for treatment or ?other patient management decisions. A negative result may occur with  ?improper specimen collection/handling, submission of  specimen other ?than nasopharyngeal swab, presence of viral mutation(s) within the ?areas targeted by this assay, and inadequate number of viral ?copies(<138 copies/mL). A negative result must be combined with ?clinical observations, patient history, and epidemiological ?information. The expected result is Negative. ? ?Fact Sheet for Patients:  ?EntrepreneurPulse.com.au ? ?Fact Sheet for Healthcare Providers:  ?IncredibleEmployment.be ? ?This test is no t yet approved or cleared by the [place] FDA and  ?has been authorized for detection and/or diagnosis of SARS-CoV-2 by ?FDA under an Emergency Use Authorization (EUA). This EUA will remain  ?in effect (meaning this test can be used) for the duration of the ?COVID-19 declaration under Section 564(b)(1) of the Act, 21 ?U.S.C.section 360bbb-3(b)(1), unless the authorization is terminated  ?or revoked sooner.  ? ? ?  ? Influenza A by PCR NEGATIVE NEGATIVE Final  ? Influenza B by PCR NEGATIVE NEGATIVE Final  ?  Comment: (NOTE) ?The Xpert Xpress SARS-CoV-2/FLU/RSV plus assay is intended as an aid ?in the diagnosis of influenza from Nasopharyngeal swab specimens and ?should not be used as a sole basis for treatment. Nasal washings and ?aspirates are unacceptable for Xpert Xpress SARS-CoV-2/FLU/RSV ?testing. ? ?Fact Sheet for Patients: ?EntrepreneurPulse.com.au ? ?Fact Sheet for Healthcare Providers: ?IncredibleEmployment.be ? ?This test is not yet approved or cleared by the [place] FDA and ?has been authorized for detection and/or diagnosis of SARS-CoV-2 by ?FDA under an Emergency Use Authorization (EUA). This EUA will remain ?in effect (meaning  this test can be used) for the duration of the ?COVID-19 declaration under Section 564(b)(1) of the Act, 21 U.S.C. ?section 360bbb-3(b)(1), unless the authorization is terminated or ?revoked. ? ?Performed at [facility], [address] [place], ?[place] [address] ?  ? ?Studies/Results: ?CT Angio Chest PE W/Cm &/Or Wo Cm ? ?Result Date: 10/28/2021 ?CLINICAL DATA:  Pulmonary embolism (PE) suspected, high prob. SOB and weakness for several days EXAM: CT ANGIOGRAPHY CHEST WITH CONTRAST TECHNIQUE: Multidetector CT imaging of the chest was performed using the standard protocol during bolus administration of intravenous contrast. Multiplanar CT image reconstructions and MIPs were obtained to evaluate the vascular anatomy. RADIATION DOSE REDUCTION: This exam was performed according to the departmental dose-optimization program which includes automated exposure control, adjustment of the mA and/or kV according to patient size and/or use of iterative reconstruction technique. CONTRAST:  31m OMNIPAQUE IOHEXOL 350 MG/ML SOLN COMPARISON:  CT chest 12/27/2020 FINDINGS: Cardiovascular: Satisfactory opacification of the pulmonary arteries to the segmental level. No evidence of pulmonary embolism. Normal heart size. No significant pericardial effusion. The thoracic aorta is normal in caliber. Mild-to-moderate atherosclerotic plaque of the thoracic aorta. No definite coronary artery calcifications. Mediastinum/Nodes: No enlarged mediastinal, hilar, or axillary lymph nodes. Thyroid gland, trachea, and esophagus demonstrate no significant findings. Lungs/Pleura: Moderate to severe centrilobular emphysematous changes. Mild paraseptal emphysematous changes. Biapical pleural/pulmonary scarring. Left upper lobe peribronchovascular consolidation. 3 mm right apical pulmonary nodule. 7 mm right lower lobe ground-glass airspace opacity (6:88). No pulmonary mass. No pleural effusion. No pneumothorax. Upper Abdomen: Nodular hepatic contour. Musculoskeletal: No chest wall abnormality. No suspicious lytic or blastic osseous lesions. No acute displaced fracture. 1 cm rightward translation of the L1 vertebral body in relation to L2 vertebral body with associated severe degenerative  changes. Marked L1 vertebral body height loss. Dextroscoliosis of the thoracic spine. Review of the MIP images confirms the above findings. IMPRESSION: 1. Left upper lobe consolidation suggestive of infection/inflammation. Underlying malignancy is not excluded. Recommend follow-up CT with intravenous contrast in 3 months to evaluate for resolution. 2. A 7 mm ground-glass right lower lobe pulmonary nodule. Initial follow-up with CT at 6 months is recommended to confirm persistence. If persistent, repeat CT is recommended every 2 years until 5 years of stability has been established. This recommendation follows the consensus statement: Guidelines for Management of Incidental Pulmonary Nodules Detected on CT Images: From the Fleischner Society 2017; Radiology 2017; 284:228-243. 3. Debris within the right mainstem bronchus. 4. Cirrhotic morphology of the liver. 5. Aortic Atherosclerosis (ICD10-I70.0) and Emphysema (ICD10-J43.9). Electronically Signed   By: MIven FinnM.D.   On: 10/28/2021 20:35  ? ?DG Chest Portable 1 View ? ?Result Date: 10/28/2021 ?CLINICAL DATA:  Shortness of breath EXAM: PORTABLE CHEST 1 VIEW COMPARISON:  CT chest dated 12/27/2020 FINDINGS: Masslike left upper lobe opacity, new from prior CT, possibly reflecting infection/pneumonia but technically indeterminate. Mild associated volume loss is suspected, suggesting some component of scarring. Right apical pleural-parenchymal scarring, unchanged. However, additional lateral right upper lobe opacity is suspected, new from prior CT. Mild left basilar opacity, possibly atelectasis. No pleural effusion or pneumothorax. The heart is normal in size.  Thoracic aortic atherosclerosis. Reverse S shaped thoracolumbar scoliosis. IMPRESSION: Patchy upper lobe opacities, new from the prior, possibly reflecting infection/pneumonia. Given the masslike appearance in the left upper lobe, consider CT chest with contrast for further evaluation. Electronically Signed    By: SJulian HyM.D.   On: 10/28/2021 19:18   ?Medications: I have reviewed the patient's  current medications. ?Scheduled Meds: ? buprenorphine-naloxone  1 tablet Sublingual Daily  ? DULoxetine  30

## 2021-10-30 NOTE — Assessment & Plan Note (Signed)
Most likely secondary to NSAID use.  EGD with large gastric and duodenal ulcers, no varices noted.  Patient received 2 unit of PRBC with improvement of hemoglobin to 8.3>>7.7.  Had 1 melanotic stool this morning. ?-Octreotide was discontinued ?-Continue Protonix infusion for another day then switch to p.o. twice daily Protonix. ?-Might need a repeat EGD if continue to bleed ?-Avoid NSAID ?-Patient will need to have an outpatient GI follow-up for further recommendations ?-Monitor hemoglobin ?-Transfuse if below 7 ?

## 2021-10-30 NOTE — Assessment & Plan Note (Signed)
-   Appears compensated at this time and not in acute exacerbation ?

## 2021-10-30 NOTE — Assessment & Plan Note (Signed)
-   Presumed secondary to upper GI bleed, as large gastric and duodenal ulcers found on EGD with no active bleeding. ?- Hemoglobin improved to 8.3>>7.7 s/p 2 unit PRBC.  No bowel movement since in the hospital. ?-Monitor hemoglobin ?-Transfuse if below 7 ?

## 2021-10-31 LAB — CBC
HCT: 22.7 % — ABNORMAL LOW (ref 36.0–46.0)
Hemoglobin: 7.4 g/dL — ABNORMAL LOW (ref 12.0–15.0)
MCH: 32.5 pg (ref 26.0–34.0)
MCHC: 32.6 g/dL (ref 30.0–36.0)
MCV: 99.6 fL (ref 80.0–100.0)
Platelets: 258 10*3/uL (ref 150–400)
RBC: 2.28 MIL/uL — ABNORMAL LOW (ref 3.87–5.11)
RDW: 15.4 % (ref 11.5–15.5)
WBC: 8.3 10*3/uL (ref 4.0–10.5)
nRBC: 0 % (ref 0.0–0.2)

## 2021-10-31 LAB — SURGICAL PATHOLOGY

## 2021-10-31 LAB — BASIC METABOLIC PANEL
Anion gap: 1 — ABNORMAL LOW (ref 5–15)
BUN: 17 mg/dL (ref 8–23)
CO2: 31 mmol/L (ref 22–32)
Calcium: 8.6 mg/dL — ABNORMAL LOW (ref 8.9–10.3)
Chloride: 103 mmol/L (ref 98–111)
Creatinine, Ser: 0.81 mg/dL (ref 0.44–1.00)
GFR, Estimated: >60 mL/min (ref >60)
Glucose, Bld: 94 mg/dL (ref 70–99)
Potassium: 4.7 mmol/L (ref 3.5–5.1)
Sodium: 135 mmol/L (ref 135–145)

## 2021-10-31 MED ORDER — NICOTINE 21 MG/24HR TD PT24
21.0000 mg | MEDICATED_PATCH | Freq: Every day | TRANSDERMAL | Status: DC
  Administered 2021-10-31: 21 mg via TRANSDERMAL
  Filled 2021-10-31 (×2): qty 1

## 2021-10-31 MED ORDER — PANTOPRAZOLE SODIUM 40 MG PO TBEC
40.0000 mg | DELAYED_RELEASE_TABLET | Freq: Two times a day (BID) | ORAL | Status: DC
  Administered 2021-10-31 – 2021-11-01 (×2): 40 mg via ORAL
  Filled 2021-10-31 (×2): qty 1

## 2021-10-31 MED ORDER — METRONIDAZOLE 500 MG PO TABS
500.0000 mg | ORAL_TABLET | Freq: Three times a day (TID) | ORAL | Status: DC
  Administered 2021-10-31 – 2021-11-01 (×2): 500 mg via ORAL
  Filled 2021-10-31 (×2): qty 1

## 2021-10-31 MED ORDER — CLARITHROMYCIN 500 MG PO TABS
500.0000 mg | ORAL_TABLET | Freq: Two times a day (BID) | ORAL | Status: DC
  Administered 2021-10-31 – 2021-11-01 (×2): 500 mg via ORAL
  Filled 2021-10-31 (×2): qty 1

## 2021-10-31 NOTE — TOC Initial Note (Signed)
Transition of Care (TOC) - Initial/Assessment Note  ? ? ?Patient Details  ?Name: Regina Bernard ?MRN: [id number] ?Date of Birth: 1956-03-17 ? ?Transition of Care (TOC) CM/SW Contact:    ?Laurena Slimmer, RN ?Phone Number: ?10/31/2021, 2:32 PM ? ?Clinical Narrative:                 ? ?Transition of Care (TOC) Screening Note ? ? ?Patient Details  ?Name: Regina Bernard ?Date of Birth: 09/09/1955 ? ? ?Transition of Care (TOC) CM/SW Contact:    ?Laurena Slimmer, RN ?Phone Number: ?10/31/2021, 2:32 PM ? ? ? ?Transition of Care Department [facility]) has reviewed patient and no TOC needs have been identified at this time. We will continue to monitor patient advancement through interdisciplinary progression rounds. If new patient transition needs arise, please place a TOC consult. ? ? ? ?  ?  ? ? ?Patient Goals and CMS Choice ?  ?  ?  ? ?Expected Discharge Plan and Services ?  ?  ?  ?  ?  ?                ?  ?  ?  ?  ?  ?  ?  ?  ?  ?  ? ?Prior Living Arrangements/Services ?  ?  ?  ?       ?  ?  ?  ?  ? ?Activities of Daily Living ?Home Assistive Devices/Equipment: None ?ADL Screening (condition at time of admission) ?Patient's cognitive ability adequate to safely complete daily activities?: Yes ?Is the patient deaf or have difficulty hearing?: No ?Does the patient have difficulty seeing, even when wearing glasses/contacts?: No ?Does the patient have difficulty concentrating, remembering, or making decisions?: No ?Patient able to express need for assistance with ADLs?: Yes ?Does the patient have difficulty dressing or bathing?: No ?Independently performs ADLs?: Yes (appropriate for developmental age) ?Does the patient have difficulty walking or climbing stairs?: Yes ?Weakness of Legs: Both ?Weakness of Arms/Hands: Both ? ?Permission Sought/Granted ?  ?  ?   ?   ?   ?   ? ?Emotional Assessment ?  ?  ?  ?  ?  ?  ? ?Admission diagnosis:  Shortness of breath [R06.02] ?Upper GI bleed [K92.2] ?Anemia, unspecified type [D64.9] ?Patient  Active Problem List  ? Diagnosis Date Noted  ? PUD (peptic ulcer disease)   ? Upper GI bleed 10/28/2021  ? Generalized anxiety disorder 10/28/2021  ? Essential hypertension 10/28/2021  ? Anemia due to acute blood loss 10/28/2021  ? Liver cirrhosis ([place]) 10/28/2021  ? COPD exacerbation ([place]) 01/14/2018  ? Carcinoma of overlapping sites of right breast in female, estrogen receptor positive ([place]) 04/18/2016  ? Severe chronic obstructive pulmonary disease ([place]) 11/16/2013  ? ?PCP:  Juluis Pitch, MD ?Pharmacy:   ?[facility] - [place], [place] - [address] ?[place] [address] ?Phone: [phone] Fax: [phone] ? ? ? ? ?Social Determinants of Health (SDOH) Interventions ?  ? ?Readmission Risk Interventions ?   ? View : No data to display.  ?  ?  ?  ? ? ? ?

## 2021-10-31 NOTE — Progress Notes (Signed)
?Progress Note ? ? ?Patient: Regina Bernard JJK:[id number] DOB: 1955-12-03 DOA: 10/28/2021     3 ?DOS: the patient was seen and examined on 11/01/2021 ?  ?Brief hospital course: ?[name] is a 66 year old female with severe COPD on chronic SPO2 is 2 L, depression, anxiety, hypertension, breast cancer, history of liver cirrhosis without noted varices, who presents emergency department for chief concerns of weakness, general malaise, and dark stools for 1 week. ? She also endorses that over the last 3 weeks she has associated shortness of breath with exertion.  She states she is never felt this way before.  She states that she has chronic back pain and takes ibuprofen and Tylenol handfuls at a time daily.  She states she has been doing this for about 6 months.   ? ?Initial vitals in the emergency department showed temperature of 97.5, respiration rate of 17, heart rate 77, blood pressure 92/53, SPO2 of 100% on 2 L nasal cannula. ? ?Serum sodium is 134, potassium 4.3, chloride 100, bicarb 25, BUN of 41, serum creatinine of 0.97, GFR greater than 60, nonfasting blood glucose 137, WBC 6.1, hemoglobin 5.8, platelets of 355. ? ?BNP was 151.2.  High sensitive troponin was 8. ? ?Portable chest x-ray: Was read as patchy upper lobe opacity, new from prior, possibly reflecting infection/pneumonia.  Given masslike appearance of the left upper lobe, consider CT chest with contrast for further evaluation. ? ?CTA of the chest PE: Was read as left upper lobe consolidation suggestive of infection/inflammation.  Underlying malignancy is not excluded.  Will need outpatient follow-up. ? ?ED treatment: Ceftriaxone 1 g, Protonix bolus and gtt., octreotide GGT, LR at 500 mL bolus. ? ?Patient quit smoking 8 months ago.Formerly, she smoked 2 packs/day. She is currently vaping nicotine daily. She endorses alcohol use, last drink was half a margarita the night of 10/27/2021. She is a former drug user including heroin, cocaine, crack,  marijuana. ?She states that she is currently taking Suboxone and has been titrating the dose down. ? ?4/24: Patient did not had any bowel movements since came to the hospital.  No nausea or vomiting.  Hemoglobin improved to 8.3 after getting 2 unit of PRBC.  EGD with large gastric and duodenal ulcers, no active bleeding.  No varices noted.  Octreotide was discontinued and GI is recommending continuation of Protonix infusion for another day and then transition to twice daily p.o.  Patient should not be on any NSAID. ? ?4/25: Hemoglobin continues to drift down, at 7.7 today.  Had 1 black color tarry stool this morning, discussed with Dr. Marius Ditch and she is recommending continuation of Protonix infusion for total of 72 hours.  If she continued to have melena or concern of GI bleed then they will repeat EGD as there was significant ulceration although no active bleed during prior EGD.  Biopsies were sent for H. Pylori. ? ?Assessment and Plan: ?* Upper GI bleed ?--EGD with large gastric and duodenal ulcers, no varices noted.  Patient received 2 unit of PRBC  ?--had extensive NSAIDs use, also found to be pos for H pylori ?Plan: ?--cont protonix gtt to finish 72 hours then transition to oral BID ?--start tx for H pylori ?- Repeat upper endoscopy in 3 months to check healing. ? ?Anemia due to acute blood loss ?- Presumed secondary to upper GI bleed, as large gastric and duodenal ulcers found on EGD with no active bleeding. ?- s/p 2 unit PRBC.   ?Plan: ?--monitor Hgb and transfuse  to keep Hgb >7 ? ?Liver cirrhosis ([place]) ?- Appears compensated at this time and not in acute exacerbation ? ?Severe chronic obstructive pulmonary disease ([place]) ?- Baseline oxygen supplementation requirement, approximately 2 L nasal cannula ?- At Her baseline now ?-Continue to monitor ?-Continue home inhalers ? ?Carcinoma of overlapping sites of right breast in female, estrogen receptor positive ([place]) ?- Status postchemotherapy, radiation, partial  mastectomy with lymph node removal on the right side ?- Continue follow-up with outpatient oncology provider recommended ?-CT chest is also concerning for left upper lobe consolidation/underlying malignancy, needs to be followed up as an outpatient ? ?Generalized anxiety disorder ?- Patient takes duloxetine 60 mg daily, Seroquel 25 mg daily, Lamictal 100 mg daily, this has been resumed ?- Ativan 1 mg IV every 6 hours as needed for anxiety, 2 doses ordered ? ?Essential hypertension ?Blood pressure within goal ?- Per outpatient [facility] clinic note, in February 2023, patient takes metoprolol 100 mg daily, this has been resumed ? ?Vapes nicotine containing substance ?--nicotine patch, per pt request ? ?H pylori ulcer ?--path from EGD pos for H pylori ?Plan: ?--start clarithromycin and flagyl ?--cont PPI gtt for 72 hours, then transition to oral PPI BID ?--will discharge on Prilosec 40 mg PO BID, per GI rec. ?- Repeat upper endoscopy in 3 months to check healing. ? ? ? ? ?  ? ?Subjective:  ?Hgb dropped a little, but remained in 7's.  Path from EGD pos for H pylori.   ? ? ?Physical Exam: ? ?Constitutional: NAD, AAOx3 ?HEENT: conjunctivae and lids normal, EOMI ?CV: No cyanosis.   ?RESP: normal respiratory effort ?SKIN: warm, dry ?Neuro: II - XII grossly intact.   ?Psych: Normal mood and affect.  Appropriate judgement and reason ? ?Data Reviewed: ? ?Family Communication:  ? ?Disposition: ?Status is: Inpatient ? ? Planned Discharge Destination: Home ? ? ? ?Time spent: 50 minutes ? ?Author: ?Enzo Bi, MD ?11/01/2021 2:29 AM ? ?For on call review [email or web address].  ?

## 2021-10-31 NOTE — Plan of Care (Signed)
?  Problem: Health Behavior/Discharge Planning: ?Goal: Ability to manage health-related needs will improve ?Outcome: Progressing ?  ?Problem: Clinical Measurements: ?Goal: Diagnostic test results will improve ?Outcome: Progressing ?  ?Problem: Clinical Measurements: ?Goal: Respiratory complications will improve ?Outcome: Progressing ?  ?Problem: Elimination: ?Goal: Will not experience complications related to bowel motility ?Outcome: Progressing ?  ?Problem: Elimination: ?Goal: Will not experience complications related to urinary retention ?Outcome: Progressing ?  ?Problem: Pain Managment: ?Goal: General experience of comfort will improve ?Outcome: Progressing ?  ?Problem: Safety: ?Goal: Ability to remain free from injury will improve ?Outcome: Progressing ?  ?

## 2021-11-01 ENCOUNTER — Telehealth: Payer: Self-pay

## 2021-11-01 DIAGNOSIS — Z720 Tobacco use: Secondary | ICD-10-CM

## 2021-11-01 LAB — CBC
HCT: 23.6 % — ABNORMAL LOW (ref 36.0–46.0)
Hemoglobin: 7.6 g/dL — ABNORMAL LOW (ref 12.0–15.0)
MCH: 32.3 pg (ref 26.0–34.0)
MCHC: 32.2 g/dL (ref 30.0–36.0)
MCV: 100.4 fL — ABNORMAL HIGH (ref 80.0–100.0)
Platelets: 296 10*3/uL (ref 150–400)
RBC: 2.35 MIL/uL — ABNORMAL LOW (ref 3.87–5.11)
RDW: 15.3 % (ref 11.5–15.5)
WBC: 7.1 10*3/uL (ref 4.0–10.5)
nRBC: 0 % (ref 0.0–0.2)

## 2021-11-01 LAB — BASIC METABOLIC PANEL
Anion gap: 4 — ABNORMAL LOW (ref 5–15)
BUN: 17 mg/dL (ref 8–23)
CO2: 30 mmol/L (ref 22–32)
Calcium: 8.3 mg/dL — ABNORMAL LOW (ref 8.9–10.3)
Chloride: 101 mmol/L (ref 98–111)
Creatinine, Ser: 0.76 mg/dL (ref 0.44–1.00)
GFR, Estimated: >60 mL/min (ref >60)
Glucose, Bld: 88 mg/dL (ref 70–99)
Potassium: 4.4 mmol/L (ref 3.5–5.1)
Sodium: 135 mmol/L (ref 135–145)

## 2021-11-01 LAB — MAGNESIUM: Magnesium: 1.6 mg/dL — ABNORMAL LOW (ref 1.7–2.4)

## 2021-11-01 MED ORDER — CLARITHROMYCIN 500 MG PO TABS: 500.0000 mg | ORAL_TABLET | Freq: Two times a day (BID) | ORAL | 0 refills | Status: AC

## 2021-11-01 MED ORDER — METRONIDAZOLE 500 MG PO TABS: 500.0000 mg | ORAL_TABLET | Freq: Three times a day (TID) | ORAL | 0 refills | Status: AC

## 2021-11-01 MED ORDER — OMEPRAZOLE MAGNESIUM 20 MG PO TBEC: 40.0000 mg | DELAYED_RELEASE_TABLET | Freq: Two times a day (BID) | ORAL | 2 refills | Status: DC

## 2021-11-01 MED ORDER — NICOTINE 21 MG/24HR TD PT24: 21.0000 mg | MEDICATED_PATCH | Freq: Every day | TRANSDERMAL | 0 refills | Status: DC

## 2021-11-01 NOTE — Assessment & Plan Note (Signed)
Blood pressure within goal ?- Per outpatient [facility] clinic note, in February 2023, patient takes metoprolol 100 mg daily, this has been resumed ?

## 2021-11-01 NOTE — Assessment & Plan Note (Addendum)
-   Presumed secondary to upper GI bleed, as large gastric and duodenal ulcers found on EGD with no active bleeding. ?- s/p 2 unit PRBC.   ?Plan: ?--monitor Hgb and transfuse to keep Hgb >7 ?

## 2021-11-01 NOTE — Assessment & Plan Note (Addendum)
--  EGD with large gastric and duodenal ulcers, no varices noted.  Patient received 2 unit of PRBC  ?--had extensive NSAIDs use, also found to be pos for H pylori ?Plan: ?--cont protonix gtt to finish 72 hours then transition to oral BID ?--start tx for H pylori ?- Repeat upper endoscopy in 3 months to check healing. ?

## 2021-11-01 NOTE — Telephone Encounter (Signed)
-----   Message from [name], MD sent at 10/31/2021  5:39 PM EDT ----- ?Regarding: Hospital follow-up ?Recommend hospital follow-up in 3 to 4 months ?Dx: History of H. pylori, peptic ulcer disease ? ?RV ? ?

## 2021-11-01 NOTE — Assessment & Plan Note (Signed)
-   Status postchemotherapy, radiation, partial mastectomy with lymph node removal on the right side ?- Continue follow-up with outpatient oncology provider recommended ?-CT chest is also concerning for left upper lobe consolidation/underlying malignancy, needs to be followed up as an outpatient ?

## 2021-11-01 NOTE — Assessment & Plan Note (Signed)
--  path from EGD pos for H pylori ?Plan: ?--start clarithromycin and flagyl ?--cont PPI gtt for 72 hours, then transition to oral PPI BID ?--will discharge on Prilosec 40 mg PO BID, per GI rec. ?- Repeat upper endoscopy in 3 months to check healing. ?

## 2021-11-01 NOTE — Assessment & Plan Note (Signed)
-   Appears compensated at this time and not in acute exacerbation ?

## 2021-11-01 NOTE — Telephone Encounter (Signed)
Made appointment for patient

## 2021-11-01 NOTE — Assessment & Plan Note (Signed)
-   Baseline oxygen supplementation requirement, approximately 2 L nasal cannula ?- At Her baseline now ?-Continue to monitor ?-Continue home inhalers ?

## 2021-11-01 NOTE — Assessment & Plan Note (Signed)
--  nicotine patch, per pt request ?

## 2021-11-01 NOTE — Assessment & Plan Note (Signed)
-   Patient takes duloxetine 60 mg daily, Seroquel 25 mg daily, Lamictal 100 mg daily, this has been resumed ?- Ativan 1 mg IV every 6 hours as needed for anxiety, 2 doses ordered ?

## 2021-11-01 NOTE — Progress Notes (Signed)
Pt does not have legal guardian  ?

## 2021-11-01 NOTE — Discharge Summary (Addendum)
? ?Physician Discharge Summary ? ? ?Susanne Borders  female DOB: 11-May-1956  ?NOM:[id number] ? ?PCP: Juluis Pitch, MD ? ?Admit date: 10/28/2021 ?Discharge date: 11/01/2021 ? ?Admitted From: home ?Disposition:  home ?Husband updated on the phone prior to discharge. ? ?CODE STATUS: Full code ? ?Discharge Instructions   ? ? Discharge instructions   Complete by: As directed ?  ? You have ulcers in your stomach and small intestine.  You also have H Pylori infection which can cause ulcers.  Please take antibiotics clarithromycin and METRONIDAZOLE for 14 days as directed.  Also need to take Prilosec for at least 3 months, and need to follow up with outpatient GI for repeat endoscopy. ? ? ?Dr. Enzo Bi ?- ?-  ? ?  ? ?Hospital Course:  ?For full details, please see H&P, progress notes, consult notes and ancillary notes.  ?Briefly,  ?Ms. Ronin Crager is a 66 year old female with severe COPD on chronic 2 L, hypertension, breast cancer, history of liver cirrhosis without noted varices, who presented emergency department for chief concerns of weakness, general malaise, and dark stools for 1 week. ? ?She also endorses that over the last 3 weeks she has associated shortness of breath with exertion.  She states that she has chronic back pain and takes ibuprofen and Tylenol handfuls at a time daily.  She states she has been doing this for about 6 months.   ? ?* Upper GI bleed ?--EGD with large gastric and duodenal ulcers, no varices noted.  Patient received 2 unit of PRBC  ?--had extensive NSAIDs use, also found to be pos for H pylori on tissue biopsy. ?--received protonix gtt to finish 72 hours then transition to oral PPI BID ?--start tx for H pylori ?- Repeat upper endoscopy in 3 months to check healing. ? ?H pylori ulcer ?--path from EGD pos for H pylori ?--discharged on 14 days of clarithromycin and flagyl ?--discharged on Prilosec 40 mg PO BID, per GI rec. ?- Repeat upper endoscopy in 3 months to check healing. ?  ?Anemia  due to acute blood loss ?- Presumed secondary to upper GI bleed, as large gastric and duodenal ulcers found on EGD with no active bleeding. ?- s/p 2 unit PRBC.   ?  ?Liver cirrhosis ([place]) ?- Appears compensated at this time and not in acute exacerbation ?  ?chronic obstructive pulmonary disease ([place]) ?Chronic hypoxemic respiratory failure ?- Baseline oxygen supplementation requirement, approximately 2 L nasal cannula ?- At baseline  ?-Continue home inhalers ?  ?Carcinoma of overlapping sites of right breast in female, estrogen receptor positive ([place]) ?- Status postchemotherapy, radiation, partial mastectomy with lymph node removal on the right side ?- Continue follow-up with outpatient oncology provider  ?-CT chest is also concerning for left upper lobe consolidation/underlying malignancy, needs to be followed up as an outpatient ?  ?Generalized anxiety disorder ?- cont duloxetine 60 mg daily, Lamictal 100 mg daily ?  ?Essential hypertension ?Blood pressure within goal ?- cont metoprolol 100 mg daily ?  ?Vapes nicotine containing substance ?--nicotine patch, per pt request ?  ?Stopped meds are ones pt wasn't taking PTA. ? ? ?Discharge Diagnoses:  ?Principal Problem: ?  Upper GI bleed ?Active Problems: ?  Anemia due to acute blood loss ?  Liver cirrhosis ([place]) ?  Severe chronic obstructive pulmonary disease ([place]) ?  Carcinoma of overlapping sites of right breast in female, estrogen receptor positive ([place]) ?  Generalized anxiety disorder ?  Essential hypertension ?  H pylori ulcer ?  Vapes nicotine containing substance ? ? ?30 Day Unplanned Readmission Risk Score   ? ?Flowsheet Row ED to Hosp-Admission (Current) from 10/28/2021 in [facility]  ?30 Day Unplanned Readmission Risk Score (%) 19.07 Filed at 11/01/2021 0400  ? ?  ? ? This score is the patient's risk of an unplanned readmission within 30 days of being discharged (0 -100%). The score is based on dignosis, age, lab data, medications,  orders, and past utilization.   ?Low:  0-14.9   Medium: 15-21.9   High: 22-29.9   Extreme: 30 and above ? ?  ? ?  ? ? ?Discharge Instructions: ? ?Allergies as of 11/01/2021   ? ?   Reactions  ? Penicillins Swelling, Rash  ? Has patient had a PCN reaction causing immediate rash, facial/tongue/throat swelling, SOB or lightheadedness with hypotension: Yes ?Has patient had a PCN reaction causing severe rash involving mucus membranes or skin necrosis: No ?Has patient had a PCN reaction that required hospitalization: No ?Has patient had a PCN reaction occurring within the last 10 years: No ?If all of the above answers are "NO", then may proceed with Cephalosporin use.  ? ?  ? ?  ?Medication List  ?  ? ?STOP taking these medications   ? ?azithromycin 250 MG tablet ?Commonly known as: Zithromax Z-Pak ?  ?Breo Ellipta 100-25 MCG/ACT Aepb ?Generic drug: fluticasone furoate-vilanterol ?  ? ?  ? ?TAKE these medications   ? ?albuterol (2.5 MG/3ML) 0.083% nebulizer solution ?Commonly known as: PROVENTIL ?Take 2.5 mg by nebulization every 6 (six) hours as needed for wheezing. ?  ?clarithromycin 500 MG tablet ?Commonly known as: BIAXIN ?Take 1 tablet (500 mg total) by mouth every 12 (twelve) hours for 14 days. ?  ?DULoxetine 30 MG capsule ?Commonly known as: CYMBALTA ?Take 1 capsule (30 mg total) by mouth 2 (two) times daily. ?  ?gabapentin 300 MG capsule ?Commonly known as: NEURONTIN ?Take 1 capsule (300 mg total) by mouth 3 (three) times daily. ?  ?lamoTRIgine 100 MG tablet ?Commonly known as: LAMICTAL ?Take 1 tablet by mouth daily. ?  ?metoprolol succinate 100 MG 24 hr tablet ?Commonly known as: TOPROL-XL ?Take 100 mg by mouth daily. Take with or immediately following a meal. ?  ?metroNIDAZOLE 500 MG tablet ?Commonly known as: FLAGYL ?Take 1 tablet (500 mg total) by mouth 3 (three) times daily for 14 days. ?  ?Multi-Vitamins Tabs ?Take 1 tablet by mouth daily. ?  ?nicotine 21 mg/24hr patch ?Commonly known as: NICODERM CQ - dosed  in mg/24 hours ?Place 1 patch (21 mg total) onto the skin daily. ?  ?omeprazole 20 MG tablet ?Commonly known as: PriLOSEC OTC ?Take 2 tablets (40 mg total) by mouth 2 (two) times daily. ?  ?OXYGEN ?Inhale 2 L into the lungs at bedtime and may repeat dose one time if needed. ?  ?Trelegy Ellipta 100-62.5-25 MCG/ACT Aepb ?Generic drug: Fluticasone-Umeclidin-Vilant ?Inhale 1 puff into the lungs daily. ?  ? ?  ? ? ? Follow-up Information   ? ? [name], MD Follow up in 3 month(s).   ?Specialty: Gastroenterology ?Why: followup ?Contact information: ?[place] [address] ?[phone] ? ? ?  ?  ? ? Juluis Pitch, MD Follow up in 1 week(s).   ?Specialty: Family Medicine ?Contact information: ?[address] ?[place] [address] ?[phone] ? ? ?  ?  ? ?  ?  ? ?  ? ? ?Allergies  ?Allergen Reactions  ? Penicillins Swelling  and Rash  ?  Has patient had a PCN reaction causing immediate rash, facial/tongue/throat swelling, SOB or lightheadedness with hypotension: Yes ?Has patient had a PCN reaction causing severe rash involving mucus membranes or skin necrosis: No ?Has patient had a PCN reaction that required hospitalization: No ?Has patient had a PCN reaction occurring within the last 10 years: No ?If all of the above answers are "NO", then may proceed with Cephalosporin use. ?  ? ? ? ?The results of significant diagnostics from this hospitalization (including imaging, microbiology, ancillary and laboratory) are listed below for reference.  ? ?Consultations: ? ? ?Procedures/Studies: ?CT ABDOMEN PELVIS W WO CONTRAST ? ?Result Date: 10/09/2021 ?CLINICAL DATA:  Weight loss, right hip pain, history of breast cancer * Tracking Code: BO * EXAM: CT ABDOMEN AND PELVIS WITHOUT AND WITH CONTRAST TECHNIQUE: Multidetector CT imaging of the abdomen and pelvis was performed following the standard protocol before and following the bolus administration of intravenous contrast. RADIATION DOSE REDUCTION:  This exam was performed according to the departmental dose-optimization program which includes automated exposure control, adjustment of the mA and/or kV according to patient size and/or use of iterative recon

## 2021-11-05 ENCOUNTER — Telehealth: Payer: Self-pay

## 2021-11-05 DIAGNOSIS — B182 Chronic viral hepatitis C: Secondary | ICD-10-CM

## 2021-11-05 LAB — HCV RT-PCR, QUANT (NON-GRAPH)
HCV log10: 4.173 log10 IU/mL
Hepatitis C Quantitation: 14900 IU/mL

## 2021-11-05 LAB — HEPATITIS PANEL, ACUTE

## 2021-11-05 NOTE — Telephone Encounter (Signed)
-----   Message from [name], MD sent at 11/05/2021 11:03 AM EDT ----- ?Please inform patient that she has hepatitis C and patient is probably aware that she has chronic hepatitis C from her blood work in the past.  If she is interested in treatment of hep C, we can initiate further work-up before starting her on treatment ? ?RV ?

## 2021-11-05 NOTE — Telephone Encounter (Signed)
She just needs a genotype and FibroSure ? ?RV ?

## 2021-11-05 NOTE — Telephone Encounter (Signed)
Patient verbalized understanding. She is interested in starting treatment. Told her this would be more blood work she would need to have done. Told her you would order the blood work and she could come and get the blood work done.  ?I have pended some blood work let me know if this is what you want  ?

## 2021-11-05 NOTE — Telephone Encounter (Signed)
Release the labs  ?

## 2021-11-08 NOTE — Telephone Encounter (Signed)
Patient left vm stating that her pcp has drawn blood and she is wondering if you can use that information or does she still need to come get blood work done in our office. Requesting a call back. ?

## 2021-11-08 NOTE — Telephone Encounter (Signed)
Patient states that her pcp order the blood work you wanted to have done yesterday. She said can you review them when the results come back  ?

## 2021-11-14 ENCOUNTER — Telehealth: Payer: Self-pay | Admitting: Gastroenterology

## 2021-11-14 NOTE — Telephone Encounter (Signed)
Patient is calling because she states she started running a 99.4 fever this morning when she woke up with body aches. She states she has a lot of congestion. This afternoon her fever is up to 100.4 and wants to know what she can take to break the fever. She states she has been told not to take tylenol and Ibuprofen  ?

## 2021-11-14 NOTE — Telephone Encounter (Signed)
Pt left message stating that she is running a fever and would like for you to give her a call back because she has other concerns also ?

## 2021-11-15 NOTE — Telephone Encounter (Signed)
Patient verbalized understanding of instructions informed patient we did not have lab work back for her HCV and Hep c genotype. She states her PCP did do the labs and she will call there office and find out  ?

## 2021-11-15 NOTE — Telephone Encounter (Signed)
Sounds like upper respiratory tract infection, she can take DayQuil and NyQuil and see if it helps.  She should reach out to her PCP if her symptoms are worsening.  I do not recommend her to take any type of nonsteroidal anti-inflammatory medications ? ?RV ?

## 2021-12-10 ENCOUNTER — Other Ambulatory Visit: Payer: Self-pay

## 2021-12-10 ENCOUNTER — Telehealth: Payer: Self-pay

## 2021-12-10 DIAGNOSIS — B1920 Unspecified viral hepatitis C without hepatic coma: Secondary | ICD-10-CM

## 2021-12-10 NOTE — Telephone Encounter (Signed)
Patient HCV fibrosure and Genotype was done by PCP. Please review and advise if patient needs to start Hep C treatment before appointment

## 2021-12-10 NOTE — Telephone Encounter (Signed)
Compensated hepatitis C cirrhosis, genotype 1a, treatment nave Recommend Mavyret 3 tablets once daily for 8 weeks  Sherri Sear, MD

## 2021-12-11 NOTE — Telephone Encounter (Signed)
Patient verbalized understanding. She states she has new insurance and informed patient I would need her to e-mail [place] her new insurance card. Sent her a e-mail so she would have my e-mail. Filled out [employer] form and will fax as soon as I get patient insurance card

## 2021-12-12 NOTE — Telephone Encounter (Signed)
I did not receive the Insurance card so I called patient again this morning and she said she would resend it

## 2021-12-12 NOTE — Telephone Encounter (Signed)
Called patient and she said she will send them sometime today and disconnected the phone. Closing telephone call will fax hep c start when I get insurance

## 2021-12-12 NOTE — Telephone Encounter (Signed)
Received insurance card and faxed Referral form to [employer]

## 2021-12-14 ENCOUNTER — Telehealth: Payer: Self-pay

## 2021-12-14 NOTE — Telephone Encounter (Signed)
[employer] is requesting office visit note and HIV lab. Faxed labs and patient hospital visit office visit

## 2022-01-02 ENCOUNTER — Telehealth: Payer: Self-pay

## 2022-01-02 MED ORDER — TRAMADOL HCL 50 MG PO TABS: 50.0000 mg | ORAL_TABLET | Freq: Four times a day (QID) | ORAL | 0 refills | Status: AC | PRN

## 2022-01-02 NOTE — Telephone Encounter (Signed)
[facility] called back and states the Provider DEA is not active

## 2022-01-02 NOTE — Telephone Encounter (Signed)
I can prescribe tramadol but I cannot prescribe any other form of opioid medication.  Let me know if patient is willing to try tramadol  Thanks RV

## 2022-01-02 NOTE — Telephone Encounter (Signed)
Called the pharmacy and gave the prescription to them verbally

## 2022-01-02 NOTE — Telephone Encounter (Signed)
Informed patient and patient verbalized understanding  

## 2022-01-02 NOTE — Telephone Encounter (Signed)
Patient states she would like [facility] to [employer]

## 2022-01-02 NOTE — Telephone Encounter (Signed)
Pharmacy called and said there system is blocking the prescription for Tramadol 50 MG and saying its invalid. Requesting a call back to get this figured out.

## 2022-01-02 NOTE — Addendum Note (Signed)
Addended by: Cephas Darby on: 01/02/2022 12:24 PM   Modules accepted: Orders

## 2022-01-02 NOTE — Telephone Encounter (Signed)
Please inform pt that she has to reach out to [place] office, there will be another doctor covering her doctor if he is out of office. I just sent only 12 pills.  RV

## 2022-01-02 NOTE — Telephone Encounter (Signed)
Patient states she had a procedure on her back on 12/18/2021. She was given Oxycodone and she took it 1 to 2 times daily but is out of the medication. She called the doctor office back and they told her that [place] provider [place] and they could not ask him to refill the medication till he return. She states she is in severe pain in her back and radiates to her hip. She states she can barely walk. She state you told her in the hospital to not take any Motrin Aleve Ibuprofen, or Tylenol. She states that she has to have something because she is in so much pain. Informed her we do not prescribed pain medication so I know that Dr. Marius Ditch is not going to prescribed it for you. She ask why is every doctor want her to be in pain and not give her pain medication. She states on the phone she is about to go to the streets and get medication." She states that she is not going to get addicted to pain medication and will

## 2022-01-04 ENCOUNTER — Other Ambulatory Visit: Payer: Self-pay | Admitting: Family Medicine

## 2022-01-04 DIAGNOSIS — Z853 Personal history of malignant neoplasm of breast: Secondary | ICD-10-CM

## 2022-01-04 DIAGNOSIS — Z85118 Personal history of other malignant neoplasm of bronchus and lung: Secondary | ICD-10-CM

## 2022-01-04 DIAGNOSIS — M25551 Pain in right hip: Secondary | ICD-10-CM

## 2022-01-09 ENCOUNTER — Ambulatory Visit
Admission: RE | Admit: 2022-01-09 | Discharge: 2022-01-09 | Disposition: A | Discharge status: Home / Self Care | Payer: Medicare Other | Source: Ambulatory Visit | Attending: Family Medicine | Admitting: Family Medicine

## 2022-01-09 DIAGNOSIS — Z853 Personal history of malignant neoplasm of breast: Secondary | ICD-10-CM | POA: Insufficient documentation

## 2022-01-09 DIAGNOSIS — Z85118 Personal history of other malignant neoplasm of bronchus and lung: Secondary | ICD-10-CM | POA: Diagnosis present

## 2022-01-09 DIAGNOSIS — M25551 Pain in right hip: Secondary | ICD-10-CM | POA: Insufficient documentation

## 2022-01-09 DIAGNOSIS — M25552 Pain in left hip: Secondary | ICD-10-CM | POA: Diagnosis present

## 2022-01-23 ENCOUNTER — Other Ambulatory Visit: Payer: Self-pay

## 2022-01-23 DIAGNOSIS — Z170 Estrogen receptor positive status [ER+]: Secondary | ICD-10-CM

## 2022-01-24 ENCOUNTER — Inpatient Hospital Stay: Payer: Medicare Other

## 2022-01-24 ENCOUNTER — Inpatient Hospital Stay: Payer: Medicare Other | Admitting: Internal Medicine

## 2022-01-24 NOTE — Progress Notes (Deleted)
[facility] OFFICE PROGRESS NOTE  Patient Care Team: Juluis Pitch, MD as PCP - General (Family Medicine)   Oncology History Overview Note  # RIGHT BREAST STAGE II [pT1psN1; neg 15LN s/p ALND] s/p Lumpec [Dr.Smith] s/p RT; AC-taxol; [name]; April 2016];STOPPED AI on Jan 2017.  April 2016- Mammo NEG   # LUL lung nodule- [poor surgical candidate]   # PN- G-2 on Neurontin   # Hot flashes- sec to AI. COPD- on 2 Lit [place] [Dr.Fleming]   Carcinoma of overlapping sites of right breast in female, estrogen receptor positive ([place])     INTERVAL HISTORY:  66 year old female patient with a history of stage II breast cancer COPD on home O2; cirrhosis is here for follow-up.  On 6/23-spoke to patient regarding results of the CT scan stable right upper lobe lung nodule; right lower lobe-suspicious for atypical infection.  Recommend Z-Pak.  Recommend evaluation with pulmonary; patient has appointment with Dr. Raul Del next week....  Patient denies any worsening shortness of breath or cough.  She continues her chronic cough chronic shortness of breath.  Chronic tingling or numbness of extremities.  Requested refill for the Neurontin.  Otherwise denies any nausea vomiting swelling of the legs.   Review of Systems  Constitutional:  Positive for malaise/fatigue. Negative for chills, diaphoresis, fever and weight loss.  HENT:  Negative for nosebleeds and sore throat.   Eyes:  Negative for double vision.  Respiratory:  Positive for cough and shortness of breath. Negative for hemoptysis, sputum production and wheezing.   Cardiovascular:  Negative for chest pain, palpitations, orthopnea and leg swelling.  Gastrointestinal:  Negative for abdominal pain, blood in stool, constipation, diarrhea, heartburn, melena, nausea and vomiting.  Genitourinary:  Negative for dysuria, frequency and urgency.  Musculoskeletal:  Negative for back pain and joint pain.  Skin: Negative.  Negative for  itching and rash.  Neurological:  Positive for tingling. Negative for dizziness, focal weakness, weakness and headaches.  Endo/Heme/Allergies:  Does not bruise/bleed easily.  Psychiatric/Behavioral:  Negative for depression. The patient is nervous/anxious. The patient does not have insomnia.      PAST MEDICAL HISTORY :  Past Medical History:  Diagnosis Date   Abnormal CT scan, chest    Anxiety    Asthma    Breast cancer ([place])    Breast CA- Right    Breast cancer ([place]) 06/18/2011   right breast cancer   COPD (chronic obstructive pulmonary disease) (HCC)    Endometriosis    tx with vaginal hysterectomy   Fatty liver    Hepatitis C 1998   [facility] trial treatment in-patient study 2005. HCV 740, 05/20/11.   History of substance abuse ([place])    Heroine, cocaine, marijuana. States all of them. Heavy use 1995 to 2000. Occasional use before and after. None since 2006.    Hot flashes related to aromatase inhibitor therapy    Hypertension    Lung mass    Peripheral neuropathy    in hands and feet    PAST SURGICAL HISTORY :   Past Surgical History:  Procedure Laterality Date   BREAST LUMPECTOMY Right 2013   ESOPHAGOGASTRODUODENOSCOPY (EGD) WITH PROPOFOL N/A 10/29/2021   Procedure: ESOPHAGOGASTRODUODENOSCOPY (EGD) WITH PROPOFOL;  Surgeon: [name], MD;  Location: [facility] ENDOSCOPY;  Service: Gastroenterology;  Laterality: N/A;   TUBAL LIGATION  1996   Upper right leg benign tumor removed     VAGINAL HYSTERECTOMY  2001    FAMILY HISTORY :   Family History  Problem Relation Age of Onset   Diabetes Maternal Grandmother    Lung cancer Father    Lung cancer Mother    Hypertension Mother    Heart disease Mother    Skin cancer Mother    Breast cancer Neg Hx     SOCIAL HISTORY:   Social History   Tobacco Use   Smoking status: Every Day    Packs/day: 0.25    Years: 44.00    Total pack years: 11.00    Types: Cigarettes    Last attempt to quit: 03/21/2016    Years since  quitting: 5.8   Smokeless tobacco: Never   Tobacco comments:    last cigeratte use 6 days ago (11/30/15)  Vaping Use   Vaping Use: Never used  Substance Use Topics   Alcohol use: Yes    Alcohol/week: 0.0 standard drinks of alcohol   Drug use: No    ALLERGIES:  is allergic to penicillins.  MEDICATIONS:  Current Outpatient Medications  Medication Sig Dispense Refill   albuterol (PROVENTIL) (2.5 MG/3ML) 0.083% nebulizer solution Take 2.5 mg by nebulization every 6 (six) hours as needed for wheezing.      DULoxetine (CYMBALTA) 30 MG capsule Take 1 capsule (30 mg total) by mouth 2 (two) times daily. 180 capsule 3   gabapentin (NEURONTIN) 300 MG capsule Take 1 capsule (300 mg total) by mouth 3 (three) times daily. 270 capsule 3   lamoTRIgine (LAMICTAL) 100 MG tablet Take 1 tablet by mouth daily.     metoprolol succinate (TOPROL-XL) 100 MG 24 hr tablet Take 100 mg by mouth daily. Take with or immediately following a meal.     Multiple Vitamin (MULTI-VITAMINS) TABS Take 1 tablet by mouth daily.      nicotine (NICODERM CQ - DOSED IN MG/24 HOURS) 21 mg/24hr patch Place 1 patch (21 mg total) onto the skin daily. 28 patch 0   omeprazole (PRILOSEC OTC) 20 MG tablet Take 2 tablets (40 mg total) by mouth 2 (two) times daily. 120 tablet 2   OXYGEN Inhale 2 L into the lungs at bedtime and may repeat dose one time if needed.      TRELEGY ELLIPTA 100-62.5-25 MCG/INH AEPB Inhale 1 puff into the lungs daily.     No current facility-administered medications for this visit.    PHYSICAL EXAMINATION: ECOG PERFORMANCE STATUS: 0 - Asymptomatic  There were no vitals taken for this visit.  There were no vitals filed for this visit.   Physical Exam Constitutional:      Comments: Appears cachectic.  Alone.  Nasal cannula oxygen.  Ambulating independently.  HENT:     Head: Normocephalic and atraumatic.     Mouth/Throat:     Pharynx: No oropharyngeal exudate.  Eyes:     Pupils: Pupils are equal, round,  and reactive to light.  Cardiovascular:     Rate and Rhythm: Normal rate and regular rhythm.  Pulmonary:     Effort: No respiratory distress.     Breath sounds: No wheezing.     Comments: Decreased air entry bilaterally. Abdominal:     General: Bowel sounds are normal. There is no distension.     Palpations: Abdomen is soft. There is no mass.     Tenderness: There is no abdominal tenderness. There is no guarding or rebound.  Musculoskeletal:        General: No tenderness. Normal range of motion.     Cervical back: Normal range of motion and neck supple.  Skin:  General: Skin is warm.     Comments: Right and left BREAST exam [in the presence of nurse]- no unusual skin changes or dominant masses felt. Surgical scars noted.    Neurological:     Mental Status: She is alert and oriented to person, place, and time.  Psychiatric:        Mood and Affect: Affect normal.        LABORATORY DATA:  I have reviewed the data as listed    Component Value Date/Time   NA 135 11/01/2021 0414   NA 133 (L) 07/04/2013 1216   K 4.4 11/01/2021 0414   K 3.5 07/04/2013 1216   CL 101 11/01/2021 0414   CL 99 07/04/2013 1216   CO2 30 11/01/2021 0414   CO2 30 07/04/2013 1216   GLUCOSE 88 11/01/2021 0414   GLUCOSE 100 (H) 07/04/2013 1216   BUN 17 11/01/2021 0414   BUN 15 07/04/2013 1216   CREATININE 0.76 11/01/2021 0414   CREATININE 0.83 10/12/2014 1239   CALCIUM 8.3 (L) 11/01/2021 0414   CALCIUM 8.8 07/04/2013 1216   PROT 6.6 10/28/2021 1858   PROT 8.5 (H) 10/12/2014 1239   ALBUMIN 2.6 (L) 10/28/2021 1858   ALBUMIN 4.1 10/12/2014 1239   AST 40 10/28/2021 1858   AST 44 (H) 10/12/2014 1239   ALT 16 10/28/2021 1858   ALT 28 10/12/2014 1239   ALKPHOS 96 10/28/2021 1858   ALKPHOS 69 10/12/2014 1239   BILITOT 0.6 10/28/2021 1858   BILITOT 0.6 10/12/2014 1239   GFRNONAA >60 11/01/2021 0414   GFRNONAA >60 10/12/2014 1239   GFRAA >60 12/21/2019 1010   GFRAA >60 10/12/2014 1239    No  results found for: "SPEP", "UPEP"  Lab Results  Component Value Date   WBC 7.1 11/01/2021   NEUTROABS 4.4 10/28/2021   HGB 7.6 (L) 11/01/2021   HCT 23.6 (L) 11/01/2021   MCV 100.4 (H) 11/01/2021   PLT 296 11/01/2021      Chemistry      Component Value Date/Time   NA 135 11/01/2021 0414   NA 133 (L) 07/04/2013 1216   K 4.4 11/01/2021 0414   K 3.5 07/04/2013 1216   CL 101 11/01/2021 0414   CL 99 07/04/2013 1216   CO2 30 11/01/2021 0414   CO2 30 07/04/2013 1216   BUN 17 11/01/2021 0414   BUN 15 07/04/2013 1216   CREATININE 0.76 11/01/2021 0414   CREATININE 0.83 10/12/2014 1239      Component Value Date/Time   CALCIUM 8.3 (L) 11/01/2021 0414   CALCIUM 8.8 07/04/2013 1216   ALKPHOS 96 10/28/2021 1858   ALKPHOS 69 10/12/2014 1239   AST 40 10/28/2021 1858   AST 44 (H) 10/12/2014 1239   ALT 16 10/28/2021 1858   ALT 28 10/12/2014 1239   BILITOT 0.6 10/28/2021 1858   BILITOT 0.6 10/12/2014 1239         ASSESSMENT & PLAN:  No problem-specific Assessment & Plan notes found for this encounter.     Cammie Sickle, MD 01/24/2022 7:45 AM

## 2022-01-24 NOTE — Assessment & Plan Note (Deleted)
#   RIGHT BREAST CA STAGE II ER/PR- Pos; her 2 NEG; clinically no evidence of recurrence noted; STOPPED  aromatase inhibitor Secondary to intolerance. mammo-dec 2020- NEG- STABLE. Will need ordered today.   # Left upper lobe lung nodule-~ 1.5cm; stable [previously 1.4cm]recommend follow up CT scab.  # PN-2-3-improved; on  Neurontin to 300 mg 3 times a day [refilled]; continue duloxetine.  Stable  # Anxiety/ depression/ memory issues-stable  # Cirrhosis- on CT-/Macrocytosis noted likely from alcohol.  Stable.  Ultrasound liver negative for any mass.    #  DISPOSITION #  bil screening mammogram 1-2 weeks # CT scan chest in 1-2 weeks # Follow up in 6 months-MD- labs- cbc/cmp/AFP;-Dr.B   Cc; Dr.Fleming

## 2022-02-05 ENCOUNTER — Other Ambulatory Visit: Payer: Self-pay

## 2022-02-05 ENCOUNTER — Telehealth: Payer: Self-pay

## 2022-02-05 DIAGNOSIS — K279 Peptic ulcer, site unspecified, unspecified as acute or chronic, without hemorrhage or perforation: Secondary | ICD-10-CM

## 2022-02-05 NOTE — Telephone Encounter (Signed)
Patient is still taking the Omeprazole '40mg'$  twice a day. Patient schedule a EGD with Dr. Marius Ditch on 02/14/2022. She verbalized understanding of instructions. Mailed instructions to patient. She said she needed [place] to mail her instructions to [place] [id number]

## 2022-02-05 NOTE — Telephone Encounter (Signed)
Please check with patient if she is still taking omeprazole 40 mg twice daily.  She had ulcers in her stomach and small intestine and her last EGD in end of April.  I recommend repeat EGD for follow-up of peptic ulcer disease as well as to confirm eradication of H. Pylori  RV

## 2022-02-05 NOTE — Telephone Encounter (Signed)
Patient is calling because she is starting to having the same symptoms she was having last time she was in the hospital. She states she knows she is not supposed to be taking Advil or Motrin but is taking both of the medication twice a day for her back pain. She states she is having a little abdominal pain at her Ovaries. Denies any nausea, vomiting, rectal bleeding, constipation or diarrhea. She states she needs a antibiotic called in so she does not end up in the hospital and her symptoms get worse

## 2022-02-06 ENCOUNTER — Other Ambulatory Visit: Payer: Self-pay | Admitting: Physical Medicine & Rehabilitation

## 2022-02-06 DIAGNOSIS — G8929 Other chronic pain: Secondary | ICD-10-CM

## 2022-02-07 ENCOUNTER — Ambulatory Visit
Admission: RE | Admit: 2022-02-07 | Discharge: 2022-02-07 | Disposition: A | Discharge status: Home / Self Care | Payer: Medicare Other | Source: Ambulatory Visit | Attending: Physical Medicine & Rehabilitation | Admitting: Physical Medicine & Rehabilitation

## 2022-02-07 DIAGNOSIS — M5442 Lumbago with sciatica, left side: Secondary | ICD-10-CM | POA: Insufficient documentation

## 2022-02-07 DIAGNOSIS — M5441 Lumbago with sciatica, right side: Secondary | ICD-10-CM | POA: Diagnosis present

## 2022-02-07 DIAGNOSIS — G8929 Other chronic pain: Secondary | ICD-10-CM | POA: Diagnosis present

## 2022-02-12 ENCOUNTER — Telehealth: Payer: Self-pay

## 2022-02-12 NOTE — Telephone Encounter (Signed)
Patient stated that she was thinking about rescheduling her EGD due to her insurance but during our conversation she decided that she would go ahead and keep it as scheduled since Dr. Verlin Grills next procedure availabilty would not be until September.  She said she would like to think about it some more.  Will keep as scheduled for now.  Thanks, [place], [place]

## 2022-02-13 ENCOUNTER — Encounter: Payer: Self-pay | Admitting: Gastroenterology

## 2022-02-14 ENCOUNTER — Ambulatory Visit: Payer: Medicare Other | Admitting: Certified Registered Nurse Anesthetist

## 2022-02-14 ENCOUNTER — Encounter: Payer: Self-pay | Admitting: Gastroenterology

## 2022-02-14 ENCOUNTER — Telehealth: Payer: Self-pay

## 2022-02-14 ENCOUNTER — Encounter
Admission: RE | Disposition: A | Discharge status: Home / Self Care | Payer: Self-pay | Source: Home / Self Care | Attending: Gastroenterology

## 2022-02-14 ENCOUNTER — Ambulatory Visit
Admission: RE | Admit: 2022-02-14 | Discharge: 2022-02-14 | Disposition: A | Discharge status: Home / Self Care | Payer: Medicare Other | Attending: Gastroenterology | Admitting: Gastroenterology

## 2022-02-14 DIAGNOSIS — Z9981 Dependence on supplemental oxygen: Secondary | ICD-10-CM | POA: Insufficient documentation

## 2022-02-14 DIAGNOSIS — Z8711 Personal history of peptic ulcer disease: Secondary | ICD-10-CM | POA: Insufficient documentation

## 2022-02-14 DIAGNOSIS — F419 Anxiety disorder, unspecified: Secondary | ICD-10-CM | POA: Insufficient documentation

## 2022-02-14 DIAGNOSIS — K3189 Other diseases of stomach and duodenum: Secondary | ICD-10-CM | POA: Insufficient documentation

## 2022-02-14 DIAGNOSIS — Z853 Personal history of malignant neoplasm of breast: Secondary | ICD-10-CM | POA: Insufficient documentation

## 2022-02-14 DIAGNOSIS — K253 Acute gastric ulcer without hemorrhage or perforation: Secondary | ICD-10-CM | POA: Insufficient documentation

## 2022-02-14 DIAGNOSIS — Z8619 Personal history of other infectious and parasitic diseases: Secondary | ICD-10-CM

## 2022-02-14 DIAGNOSIS — K279 Peptic ulcer, site unspecified, unspecified as acute or chronic, without hemorrhage or perforation: Secondary | ICD-10-CM

## 2022-02-14 DIAGNOSIS — J449 Chronic obstructive pulmonary disease, unspecified: Secondary | ICD-10-CM | POA: Diagnosis not present

## 2022-02-14 DIAGNOSIS — I10 Essential (primary) hypertension: Secondary | ICD-10-CM | POA: Diagnosis not present

## 2022-02-14 DIAGNOSIS — K263 Acute duodenal ulcer without hemorrhage or perforation: Secondary | ICD-10-CM | POA: Insufficient documentation

## 2022-02-14 DIAGNOSIS — K2950 Unspecified chronic gastritis without bleeding: Secondary | ICD-10-CM | POA: Insufficient documentation

## 2022-02-14 HISTORY — PX: ESOPHAGOGASTRODUODENOSCOPY (EGD) WITH PROPOFOL: SHX5813

## 2022-02-14 SURGERY — ESOPHAGOGASTRODUODENOSCOPY (EGD) WITH PROPOFOL
Anesthesia: General

## 2022-02-14 MED ORDER — PROPOFOL 500 MG/50ML IV EMUL
INTRAVENOUS | Status: DC | PRN
  Administered 2022-02-14: 150 ug/kg/min via INTRAVENOUS

## 2022-02-14 MED ORDER — SODIUM CHLORIDE 0.9 % IV SOLN
INTRAVENOUS | Status: DC
  Administered 2022-02-14: 1000 mL via INTRAVENOUS

## 2022-02-14 MED ORDER — PROPOFOL 10 MG/ML IV BOLUS
INTRAVENOUS | Status: DC | PRN
  Administered 2022-02-14: 70 mg via INTRAVENOUS

## 2022-02-14 MED ORDER — LIDOCAINE HCL (CARDIAC) PF 100 MG/5ML IV SOSY
PREFILLED_SYRINGE | INTRAVENOUS | Status: DC | PRN
  Administered 2022-02-14: 80 mg via INTRAVENOUS

## 2022-02-14 MED ORDER — OMEPRAZOLE MAGNESIUM 20 MG PO TBEC: 40.0000 mg | DELAYED_RELEASE_TABLET | Freq: Every day | ORAL | 3 refills | Status: DC

## 2022-02-14 MED ORDER — GLYCOPYRROLATE 0.2 MG/ML IJ SOLN
INTRAMUSCULAR | Status: DC | PRN
  Administered 2022-02-14: .2 mg via INTRAVENOUS

## 2022-02-14 NOTE — Op Note (Signed)
[facility] Gastroenterology Patient Name: Regina Bernard Procedure Date: 02/14/2022 10:24 AM MRN: [id number] Account #: [id number] Date of Birth: 07/09/1955 Admit Type: Inpatient Age: 66 Room: [facility] ENDO ROOM 4 Gender: Female Note Status: Finalized Instrument Name: Upper Endoscope [id number] Procedure:             Upper GI endoscopy Indications:           Previously treated for Helicobacter pylori, Follow-up                         of acute gastric ulcer, Follow-up of acute duodenal                         ulcer Providers:             [name] MD, MD Referring MD:          Pauline Good, MD (Referring MD) Medicines:             General Anesthesia Complications:         No immediate complications. Estimated blood loss: None. Procedure:             Pre-Anesthesia Assessment:                        - Prior to the procedure, a History and Physical was                         performed, and patient medications and allergies were                         reviewed. The patient is competent. The risks and                         benefits of the procedure and the sedation options and                         risks were discussed with the patient. All questions                         were answered and informed consent was obtained.                         Patient identification and proposed procedure were                         verified by the physician, the nurse, the                         anesthesiologist, the anesthetist and the technician                         in the pre-procedure area in the procedure room in the                         endoscopy suite. Mental Status Examination: alert and                         oriented. Airway Examination: normal oropharyngeal  airway and neck mobility. Respiratory Examination:                         clear to auscultation. CV Examination: normal.                         Prophylactic Antibiotics: The  patient does not require                         prophylactic antibiotics. Prior Anticoagulants: The                         patient has taken no previous anticoagulant or                         antiplatelet agents. ASA Grade Assessment: III - A                         patient with severe systemic disease. After reviewing                         the risks and benefits, the patient was deemed in                         satisfactory condition to undergo the procedure. The                         anesthesia plan was to use general anesthesia.                         Immediately prior to administration of medications,                         the patient was re-assessed for adequacy to receive                         sedatives. The heart rate, respiratory rate, oxygen                         saturations, blood pressure, adequacy of pulmonary                         ventilation, and response to care were monitored                         throughout the procedure. The physical status of the                         patient was re-assessed after the procedure.                        After obtaining informed consent, the endoscope was                         passed under direct vision. Throughout the procedure,                         the patient's blood pressure, pulse, and oxygen  saturations were monitored continuously. The Endoscope                         was introduced through the mouth, and advanced to the                         second part of duodenum. The upper GI endoscopy was                         accomplished without difficulty. The patient tolerated                         the procedure well. Findings:      The duodenal bulb and second portion of the duodenum were normal.      Diffuse mildly erythematous mucosa without bleeding was found in the       gastric antrum. Biopsies were taken with a cold forceps for Helicobacter       pylori testing.      The  gastric body and incisura were normal. Biopsies were taken with a       cold forceps for Helicobacter pylori testing.      The cardia and gastric fundus were normal on retroflexion.      The gastroesophageal junction and examined esophagus were normal. Impression:            - Normal duodenal bulb and second portion of the                         duodenum.                        - Erythematous mucosa in the antrum. Biopsied.                        - Normal gastric body and incisura. Biopsied.                        - Normal gastroesophageal junction and esophagus. Recommendation:        - Discharge patient to home (with escort).                        - Resume previous diet today.                        - Continue present medications.                        - Await pathology results. Procedure Code(s):     --- Professional ---                        [phone], Esophagogastroduodenoscopy, flexible,                         transoral; with biopsy, single or multiple Diagnosis Code(s):     --- Professional ---                        K31.89, Other diseases of stomach and duodenum  K25.3, Acute gastric ulcer without hemorrhage or                         perforation                        K26.3, Acute duodenal ulcer without hemorrhage or                         perforation CPT copyright 2019 American Medical Association. All rights reserved. The codes documented in this report are preliminary and upon coder review may  be revised to meet current compliance requirements. Dr. Ulyess Mort [name] MD, MD 02/14/2022 11:39:58 AM This report has been signed electronically. Number of Addenda: 0 Note Initiated On: 02/14/2022 10:24 AM Estimated Blood Loss:  Estimated blood loss: none.      [facility]

## 2022-02-14 NOTE — Anesthesia Procedure Notes (Signed)
Date/Time: 02/14/2022 11:25 AM  Performed by: Lily Peer, Sharen Youngren, CRNAPre-anesthesia Checklist: Patient identified, Emergency Drugs available, Suction available, Patient being monitored and Timeout performed Patient Re-evaluated:Patient Re-evaluated prior to induction Oxygen Delivery Method: Simple face mask Induction Type: IV induction

## 2022-02-14 NOTE — H&P (Signed)
Cephas Darby, MD [address]  [place]  [place], [place] [address]  Main: [phone]  Fax: [phone] Pager: [phone]  Primary Care Physician:  Juluis Pitch, MD Primary Gastroenterologist:  Dr. Cephas Darby  Pre-Procedure History & Physical: HPI:  Regina Bernard is a 66 y.o. female is here for an endoscopy.   Past Medical History:  Diagnosis Date   Abnormal CT scan, chest    Anxiety    Asthma    Breast cancer ([place])    Breast CA- Right    Breast cancer ([place]) 06/18/2011   right breast cancer   COPD (chronic obstructive pulmonary disease) (HCC)    Endometriosis    tx with vaginal hysterectomy   Fatty liver    Hepatitis C 1998   [facility] trial treatment in-patient study 2005. HCV 740, 05/20/11.   History of substance abuse ([place])    Heroine, cocaine, marijuana. States all of them. Heavy use 1995 to 2000. Occasional use before and after. None since 2006.    Hot flashes related to aromatase inhibitor therapy    Hypertension    Lung mass    Peripheral neuropathy    in hands and feet    Past Surgical History:  Procedure Laterality Date   BREAST LUMPECTOMY Right 2013   ESOPHAGOGASTRODUODENOSCOPY (EGD) WITH PROPOFOL N/A 10/29/2021   Procedure: ESOPHAGOGASTRODUODENOSCOPY (EGD) WITH PROPOFOL;  Surgeon: [name], MD;  Location: [facility] ENDOSCOPY;  Service: Gastroenterology;  Laterality: N/A;   TUBAL LIGATION  1996   Upper right leg benign tumor removed     VAGINAL HYSTERECTOMY  2001    Prior to Admission medications   Medication Sig Start Date End Date Taking? Authorizing Provider  DULoxetine (CYMBALTA) 30 MG capsule Take 1 capsule (30 mg total) by mouth 2 (two) times daily. 09/19/17  Yes Cammie Sickle, MD  gabapentin (NEURONTIN) 300 MG capsule Take 1 capsule (300 mg total) by mouth 3 (three) times daily. 10/25/20  Yes Cammie Sickle, MD  lamoTRIgine (LAMICTAL) 100 MG tablet Take 1 tablet by mouth daily. 09/03/18  Yes [provider]  metoprolol succinate (TOPROL-XL) 100 MG 24 hr tablet Take 100 mg by mouth daily. Take with or immediately following a meal.   Yes [provider]  Multiple Vitamin (MULTI-VITAMINS) TABS Take 1 tablet by mouth daily.    Yes [provider]  OXYGEN Inhale 2 L into the lungs at bedtime and may repeat dose one time if needed.    Yes [provider]  TRELEGY ELLIPTA 100-62.5-25 MCG/INH AEPB Inhale 1 puff into the lungs daily. 11/06/18  Yes [provider]  albuterol (PROVENTIL) (2.5 MG/3ML) 0.083% nebulizer solution Take 2.5 mg by nebulization every 6 (six) hours as needed for wheezing.  09/16/14   [provider]  nicotine (NICODERM CQ - DOSED IN MG/24 HOURS) 21 mg/24hr patch Place 1 patch (21 mg total) onto the skin daily. Patient not taking: Reported on 02/14/2022 11/01/21   Enzo Bi, MD  omeprazole (PRILOSEC OTC) 20 MG tablet Take 2 tablets (40 mg total) by mouth 2 (two) times daily. 11/01/21 01/30/22  Enzo Bi, MD    Allergies as of 02/06/2022 - Review Complete 10/29/2021  Allergen Reaction Noted   Penicillins Swelling and Rash 01/17/2015    Family History  Problem Relation Age of Onset   Diabetes Maternal Grandmother    Lung cancer Father    Lung cancer Mother    Hypertension Mother    Heart disease Mother  Skin cancer Mother    Breast cancer Neg Hx     Social History   Socioeconomic History   Marital status: Married    Spouse name: Not on file   Number of children: Not on file   Years of education: Not on file   Highest education level: Not on file  Occupational History   Not on file  Tobacco Use   Smoking status: Every Day    Types: E-cigarettes   Smokeless tobacco: Never   Tobacco comments:    last cigeratte use 6 days ago (11/30/15)  Vaping Use   Vaping Use: Every day  Substance and Sexual Activity   Alcohol use: Yes    Alcohol/week: 0.0 standard drinks of alcohol   Drug use: No   Sexual activity: Never  Other Topics  Concern   Not on file  Social History Narrative   Not on file   Social Determinants of Health   Financial Resource Strain: Not on file  Food Insecurity: Not on file  Transportation Needs: Not on file  Physical Activity: Not on file  Stress: Not on file  Social Connections: Not on file  Intimate Partner Violence: Not on file    Review of Systems: See HPI, otherwise negative ROS  Physical Exam: BP 110/82   Pulse 74   Temp (!) 97.1 F (36.2 C) (Temporal)   Resp 18   Ht '5\' 7"'$  (1.702 m)   Wt 55.9 kg   SpO2 96%   BMI 19.30 kg/m  General:   Alert,  pleasant and cooperative in NAD Head:  Normocephalic and atraumatic. Neck:  Supple; no masses or thyromegaly. Lungs:  Clear throughout to auscultation.    Heart:  Regular rate and rhythm. Abdomen:  Soft, nontender and nondistended. Normal bowel sounds, without guarding, and without rebound.   Neurologic:  Alert and  oriented x4;  grossly normal neurologically.  Impression/Plan: Regina Bernard is here for an endoscopy to be performed for follow up PUD  Risks, benefits, limitations, and alternatives regarding  endoscopy have been reviewed with the patient.  Questions have been answered.  All parties agreeable.   Sherri Sear, MD  02/14/2022, 11:15 AM

## 2022-02-14 NOTE — Telephone Encounter (Signed)
-----   Message from [name], MD sent at 02/14/2022 11:50 AM EDT ----- Regarding: colonoscopy Please call her sometime tomorrow or next week to schedule screening colonoscopy  RV

## 2022-02-14 NOTE — Anesthesia Postprocedure Evaluation (Signed)
Anesthesia Post Note  Patient: Regina Bernard  Procedure(s) Performed: ESOPHAGOGASTRODUODENOSCOPY (EGD) WITH PROPOFOL  Patient location during evaluation: Endoscopy Anesthesia Type: General Level of consciousness: awake and alert Pain management: pain level controlled Vital Signs Assessment: post-procedure vital signs reviewed and stable Respiratory status: spontaneous breathing, nonlabored ventilation, respiratory function stable and patient connected to nasal cannula oxygen Cardiovascular status: blood pressure returned to baseline and stable Postop Assessment: no apparent nausea or vomiting Anesthetic complications: no   No notable events documented.   Last Vitals:  Vitals:   02/14/22 1152 02/14/22 1202  BP: 119/85 136/87  Pulse: 84 78  Resp: 12 13  Temp:    SpO2: 97% 94%    Last Pain:  Vitals:   02/14/22 1202  TempSrc:   PainSc: 0-No pain                 Arita Miss

## 2022-02-14 NOTE — Anesthesia Preprocedure Evaluation (Signed)
Anesthesia Evaluation  Patient identified by MRN, date of birth, ID band  Reviewed: Allergy & Precautions, NPO status , Patient's Chart, lab work & pertinent test results  History of Anesthesia Complications Negative for: history of anesthetic complications  Airway Mallampati: III  TM Distance: >3 FB Neck ROM: Full    Dental  (+) Teeth Intact, Implants   Pulmonary asthma , neg sleep apnea, COPD,  oxygen dependent, Patient abstained from smoking.Not current smoker, former smoker,     + decreased breath sounds      Cardiovascular Exercise Tolerance: Good METShypertension, Pt. on medications (-) CAD and (-) Past MI (-) dysrhythmias  Rhythm:Regular Rate:Normal - Systolic murmurs    Neuro/Psych PSYCHIATRIC DISORDERS Anxiety    GI/Hepatic PUD, neg GERD  ,(+) Cirrhosis     (-) substance abuse  , Hepatitis -, C  Endo/Other  neg diabetes  Renal/GU negative Renal ROS     Musculoskeletal   Abdominal   Peds  Hematology  (+) Blood dyscrasia, anemia ,   Anesthesia Other Findings Past Medical History: No date: Abnormal CT scan, chest No date: Anxiety No date: Asthma No date: Breast cancer [facility])     Comment:  Breast CA- Right  06/18/2011: Breast cancer ([place])     Comment:  right breast cancer No date: COPD (chronic obstructive pulmonary disease) (HCC) No date: Endometriosis     Comment:  tx with vaginal hysterectomy No date: Fatty liver 1998: Hepatitis C     Comment:  [facility] trial treatment in-patient study 2005. HCV 740,               05/20/11. No date: History of substance abuse ([place])     Comment:  Heroine, cocaine, marijuana. States all of them. Heavy               use 1995 to 2000. Occasional use before and after. None               since 2006.  No date: Hot flashes related to aromatase inhibitor therapy No date: Hypertension No date: Lung mass No date: Peripheral neuropathy     Comment:  in hands and feet   Reproductive/Obstetrics                             Anesthesia Physical  Anesthesia Plan  ASA: 3  Anesthesia Plan: General   Post-op Pain Management: Minimal or no pain anticipated   Induction: Intravenous  PONV Risk Score and Plan: 3 and Propofol infusion, TIVA and Ondansetron  Airway Management Planned: Nasal Cannula  Additional Equipment: None  Intra-op Plan:   Post-operative Plan:   Informed Consent: I have reviewed the patients History and Physical, chart, labs and discussed the procedure including the risks, benefits and alternatives for the proposed anesthesia with the patient or authorized representative who has indicated his/her understanding and acceptance.     Dental advisory given  Plan Discussed with: CRNA and Surgeon  Anesthesia Plan Comments: (Discussed risks of anesthesia with patient, including possibility of difficulty with spontaneous ventilation under anesthesia necessitating airway intervention, PONV, and rare risks such as cardiac or respiratory or neurological events, and allergic reactions. Discussed the role of CRNA in patient's perioperative care. Patient understands.)        Anesthesia Quick Evaluation

## 2022-02-14 NOTE — Transfer of Care (Signed)
Immediate Anesthesia Transfer of Care Note  Patient: Regina Bernard  Procedure(s) Performed: ESOPHAGOGASTRODUODENOSCOPY (EGD) WITH PROPOFOL  Patient Location: Endoscopy Unit  Anesthesia Type:General  Level of Consciousness: awake, alert  and oriented  Airway & Oxygen Therapy: Patient Spontanous Breathing  Post-op Assessment: Report given to RN and Post -op Vital signs reviewed and stable  Post vital signs: Reviewed and stable  Last Vitals:  Vitals Value Taken Time  BP 91/75   Temp    Pulse 72   Resp 15   SpO2 100     Last Pain:  Vitals:   02/14/22 1024  TempSrc: Temporal  PainSc: 6          Complications: No notable events documented.

## 2022-02-15 ENCOUNTER — Encounter: Payer: Self-pay | Admitting: Gastroenterology

## 2022-02-15 LAB — SURGICAL PATHOLOGY

## 2022-02-18 ENCOUNTER — Other Ambulatory Visit: Payer: Self-pay

## 2022-02-18 DIAGNOSIS — Z1211 Encounter for screening for malignant neoplasm of colon: Secondary | ICD-10-CM

## 2022-02-18 MED ORDER — NA SULFATE-K SULFATE-MG SULF 17.5-3.13-1.6 GM/177ML PO SOLN: 354.0000 mL | Freq: Once | ORAL | 0 refills | Status: AC

## 2022-02-18 NOTE — Telephone Encounter (Signed)
Got patient schedule for 03/20/22. Went over instructions with patient and mailed. Sent prep to pharmacy

## 2022-02-21 ENCOUNTER — Inpatient Hospital Stay: Payer: Medicare Other

## 2022-02-21 ENCOUNTER — Inpatient Hospital Stay: Payer: Medicare Other | Admitting: Internal Medicine

## 2022-02-21 NOTE — Assessment & Plan Note (Deleted)
#   RIGHT BREAST CA STAGE II ER/PR- Pos; her 2 NEG; clinically no evidence of recurrence noted; STOPPED  aromatase inhibitor Secondary to intolerance. mammo-dec 2020- NEG- STABLE. Will need ordered today.   # Left upper lobe lung nodule-~ 1.5cm; stable [previously 1.4cm]recommend follow up CT scab.  # PN-2-3-improved; on  Neurontin to 300 mg 3 times a day [refilled]; continue duloxetine.  Stable  # Anxiety/ depression/ memory issues-stable  # Cirrhosis- on CT-/Macrocytosis noted likely from alcohol.  Stable.  Ultrasound liver negative for any mass.    #  DISPOSITION #  bil screening mammogram 1-2 weeks # CT scan chest in 1-2 weeks # Follow up in 6 months-MD- labs- cbc/cmp/AFP;-Dr.B   Cc; Dr.Fleming

## 2022-02-21 NOTE — Progress Notes (Deleted)
[facility] OFFICE PROGRESS NOTE  Patient Care Team: Juluis Pitch, MD as PCP - General (Family Medicine)   Oncology History Overview Note  # RIGHT BREAST STAGE II [pT1psN1; neg 15LN s/p ALND] s/p Lumpec [Dr.Smith] s/p RT; AC-taxol; Regina Bernard; April 2016];STOPPED AI on Jan 2017.  April 2016- Mammo NEG  # LUL lung nodule- [poor surgical candidate]  # PN- G-2 on Neurontin  # Hot flashes- sec to AI. COPD- on 2 Lit [place] [Dr.Fleming]   Carcinoma of overlapping sites of right breast in female, estrogen receptor positive ([name])     INTERVAL HISTORY:  66 year old female patient with a history of stage II breast cancer COPD on home O2; cirrhosis is here for follow-up.  On 6/23-spoke to patient regarding results of the CT scan stable right upper lobe lung nodule; right lower lobe-suspicious for atypical infection.  Recommend Z-Pak.  Recommend evaluation with pulmonary; patient has appointment with Dr. Raul Del next week....  Patient denies any worsening shortness of breath or cough.  She continues her chronic cough chronic shortness of breath.  Chronic tingling or numbness of extremities.  Requested refill for the Neurontin.  Otherwise denies any nausea vomiting swelling of the legs.   Review of Systems  Constitutional:  Positive for malaise/fatigue. Negative for chills, diaphoresis, fever and weight loss.  HENT:  Negative for nosebleeds and sore throat.   Eyes:  Negative for double vision.  Respiratory:  Positive for cough and shortness of breath. Negative for hemoptysis, sputum production and wheezing.   Cardiovascular:  Negative for chest pain, palpitations, orthopnea and leg swelling.  Gastrointestinal:  Negative for abdominal pain, blood in stool, constipation, diarrhea, heartburn, melena, nausea and vomiting.  Genitourinary:  Negative for dysuria, frequency and urgency.  Musculoskeletal:  Negative for back pain and joint pain.  Skin: Negative.  Negative for  itching and rash.  Neurological:  Positive for tingling. Negative for dizziness, focal weakness, weakness and headaches.  Endo/Heme/Allergies:  Does not bruise/bleed easily.  Psychiatric/Behavioral:  Negative for depression. The patient is nervous/anxious. The patient does not have insomnia.      PAST MEDICAL HISTORY :  Past Medical History:  Diagnosis Date  . Abnormal CT scan, chest   . Anxiety   . Asthma   . Breast cancer (Regina Bernard)    Breast CA- Right   . Breast cancer (Regina Bernard) 06/18/2011   right breast cancer  . COPD (chronic obstructive pulmonary disease) ([place])   . Endometriosis    tx with vaginal hysterectomy  . Fatty liver   . Hepatitis C 1998   [facility] trial treatment in-patient study 2005. HCV 740, 05/20/11.  [name] History of substance abuse ([place])    Heroine, cocaine, marijuana. States all of them. Heavy use 1995 to 2000. Occasional use before and after. None since 2006.   [name] Hot flashes related to aromatase inhibitor therapy   . Hypertension   . Lung mass   . Peripheral neuropathy    in hands and feet    PAST SURGICAL HISTORY :   Past Surgical History:  Procedure Laterality Date  . BREAST LUMPECTOMY Right 2013  . ESOPHAGOGASTRODUODENOSCOPY (EGD) WITH PROPOFOL N/A 10/29/2021   Procedure: ESOPHAGOGASTRODUODENOSCOPY (EGD) WITH PROPOFOL;  Surgeon: [name], MD;  Location: [facility];  Service: Gastroenterology;  Laterality: N/A;  . ESOPHAGOGASTRODUODENOSCOPY (EGD) WITH PROPOFOL N/A 02/14/2022   Procedure: ESOPHAGOGASTRODUODENOSCOPY (EGD) WITH PROPOFOL;  Surgeon: [name], MD;  Location: [facility] ENDOSCOPY;  Service: Gastroenterology;  Laterality: N/A;  DRIVER 5 - 10  MINUTES AWAY  . TUBAL LIGATION  1996  . Upper right leg benign tumor removed    . VAGINAL HYSTERECTOMY  2001    FAMILY HISTORY :   Family History  Problem Relation Age of Onset  . Diabetes Maternal Grandmother   . Lung cancer Father   . Lung cancer Mother   . Hypertension Mother   . Heart  disease Mother   . Skin cancer Mother   . Breast cancer Neg Hx     SOCIAL HISTORY:   Social History   Tobacco Use  . Smoking status: Every Day    Types: E-cigarettes  . Smokeless tobacco: Never  . Tobacco comments:    last cigeratte use 6 days ago (11/30/15)  Vaping Use  . Vaping Use: Every day  Substance Use Topics  . Alcohol use: Yes    Alcohol/week: 0.0 standard drinks of alcohol  . Drug use: No    ALLERGIES:  is allergic to penicillins.  MEDICATIONS:  Current Outpatient Medications  Medication Sig Dispense Refill  . albuterol (PROVENTIL) (2.5 MG/3ML) 0.083% nebulizer solution Take 2.5 mg by nebulization every 6 (six) hours as needed for wheezing.     . DULoxetine (CYMBALTA) 30 MG capsule Take 1 capsule (30 mg total) by mouth 2 (two) times daily. 180 capsule 3  . gabapentin (NEURONTIN) 300 MG capsule Take 1 capsule (300 mg total) by mouth 3 (three) times daily. 270 capsule 3  . lamoTRIgine (LAMICTAL) 100 MG tablet Take 1 tablet by mouth daily.    . metoprolol succinate (TOPROL-XL) 100 MG 24 hr tablet Take 100 mg by mouth daily. Take with or immediately following a meal.    . Multiple Vitamin (MULTI-VITAMINS) TABS Take 1 tablet by mouth daily.     . nicotine (NICODERM CQ - DOSED IN MG/24 HOURS) 21 mg/24hr patch Place 1 patch (21 mg total) onto the skin daily. (Patient not taking: Reported on 02/14/2022) 28 patch 0  . omeprazole (PRILOSEC OTC) 20 MG tablet Take 2 tablets (40 mg total) by mouth daily before breakfast. 180 tablet 3  . OXYGEN Inhale 2 L into the lungs at bedtime and may repeat dose one time if needed.     . TRELEGY ELLIPTA 100-62.5-25 MCG/INH AEPB Inhale 1 puff into the lungs daily.     No current facility-administered medications for this visit.    PHYSICAL EXAMINATION: ECOG PERFORMANCE STATUS: 0 - Asymptomatic  There were no vitals taken for this visit.  There were no vitals filed for this visit.   Physical Exam Constitutional:      Comments: Appears  cachectic.  Alone.  Nasal cannula oxygen.  Ambulating independently.  HENT:     Head: Normocephalic and atraumatic.     Mouth/Throat:     Pharynx: No oropharyngeal exudate.  Eyes:     Pupils: Pupils are equal, round, and reactive to light.  Cardiovascular:     Rate and Rhythm: Normal rate and regular rhythm.  Pulmonary:     Effort: No respiratory distress.     Breath sounds: No wheezing.     Comments: Decreased air entry bilaterally. Abdominal:     General: Bowel sounds are normal. There is no distension.     Palpations: Abdomen is soft. There is no mass.     Tenderness: There is no abdominal tenderness. There is no guarding or rebound.  Musculoskeletal:        General: No tenderness. Normal range of motion.     Cervical back: Normal range  of motion and neck supple.  Skin:    General: Skin is warm.     Comments: Right and left BREAST exam [in the presence of nurse]- no unusual skin changes or dominant masses felt. Surgical scars noted.    Neurological:     Mental Status: She is alert and oriented to person, place, and time.  Psychiatric:        Mood and Affect: Affect normal.       LABORATORY DATA:  I have reviewed the data as listed    Component Value Date/Time   NA 135 11/01/2021 0414   NA 133 (L) 07/04/2013 1216   K 4.4 11/01/2021 0414   K 3.5 07/04/2013 1216   CL 101 11/01/2021 0414   CL 99 07/04/2013 1216   CO2 30 11/01/2021 0414   CO2 30 07/04/2013 1216   GLUCOSE 88 11/01/2021 0414   GLUCOSE 100 (H) 07/04/2013 1216   BUN 17 11/01/2021 0414   BUN 15 07/04/2013 1216   CREATININE 0.76 11/01/2021 0414   CREATININE 0.83 10/12/2014 1239   CALCIUM 8.3 (L) 11/01/2021 0414   CALCIUM 8.8 07/04/2013 1216   PROT 6.6 10/28/2021 1858   PROT 8.5 (H) 10/12/2014 1239   ALBUMIN 2.6 (L) 10/28/2021 1858   ALBUMIN 4.1 10/12/2014 1239   AST 40 10/28/2021 1858   AST 44 (H) 10/12/2014 1239   ALT 16 10/28/2021 1858   ALT 28 10/12/2014 1239   ALKPHOS 96 10/28/2021 1858    ALKPHOS 69 10/12/2014 1239   BILITOT 0.6 10/28/2021 1858   BILITOT 0.6 10/12/2014 1239   GFRNONAA >60 11/01/2021 0414   GFRNONAA >60 10/12/2014 1239   GFRAA >60 12/21/2019 1010   GFRAA >60 10/12/2014 1239    No results found for: "SPEP", "UPEP"  Lab Results  Component Value Date   WBC 7.1 11/01/2021   NEUTROABS 4.4 10/28/2021   HGB 7.6 (L) 11/01/2021   HCT 23.6 (L) 11/01/2021   MCV 100.4 (H) 11/01/2021   PLT 296 11/01/2021      Chemistry      Component Value Date/Time   NA 135 11/01/2021 0414   NA 133 (L) 07/04/2013 1216   K 4.4 11/01/2021 0414   K 3.5 07/04/2013 1216   CL 101 11/01/2021 0414   CL 99 07/04/2013 1216   CO2 30 11/01/2021 0414   CO2 30 07/04/2013 1216   BUN 17 11/01/2021 0414   BUN 15 07/04/2013 1216   CREATININE 0.76 11/01/2021 0414   CREATININE 0.83 10/12/2014 1239      Component Value Date/Time   CALCIUM 8.3 (L) 11/01/2021 0414   CALCIUM 8.8 07/04/2013 1216   ALKPHOS 96 10/28/2021 1858   ALKPHOS 69 10/12/2014 1239   AST 40 10/28/2021 1858   AST 44 (H) 10/12/2014 1239   ALT 16 10/28/2021 1858   ALT 28 10/12/2014 1239   BILITOT 0.6 10/28/2021 1858   BILITOT 0.6 10/12/2014 1239         ASSESSMENT & PLAN:  No problem-specific Assessment & Plan notes found for this encounter.     Cammie Sickle, MD 02/21/2022 8:36 AM

## 2022-02-27 ENCOUNTER — Encounter: Payer: Self-pay | Admitting: Gastroenterology

## 2022-03-12 ENCOUNTER — Inpatient Hospital Stay: Payer: Medicare Other

## 2022-03-12 ENCOUNTER — Inpatient Hospital Stay: Payer: Medicare Other | Admitting: Internal Medicine

## 2022-03-12 NOTE — Progress Notes (Deleted)
[facility] OFFICE PROGRESS NOTE  Patient Care Team: Juluis Pitch, MD as PCP - General (Family Medicine) Cammie Sickle, MD as Consulting Physician (Oncology)   Oncology History Overview Note  # RIGHT BREAST STAGE II [pT1psN1; neg 15LN s/p ALND] s/p Lumpec [Dr.Smith] s/p RT; AC-taxol; [name]; April 2016];STOPPED AI on Jan 2017.  April 2016- Mammo NEG   # LUL lung nodule- [poor surgical candidate]   # PN- G-2 on Neurontin   # Hot flashes- sec to AI. COPD- on 2 Lit [place] [Dr.Fleming]   Carcinoma of overlapping sites of right breast in female, estrogen receptor positive ([place])     INTERVAL HISTORY:  66 year old female patient with a history of stage II breast cancer COPD on home O2; cirrhosis is here for follow-up.  On 6/23-spoke to patient regarding results of the CT scan stable right upper lobe lung nodule; right lower lobe-suspicious for atypical infection.  Recommend Z-Pak.  Recommend evaluation with pulmonary; patient has appointment with Dr. Raul Del next week....  Patient denies any worsening shortness of breath or cough.  She continues her chronic cough chronic shortness of breath.  Chronic tingling or numbness of extremities.  Requested refill for the Neurontin.  Otherwise denies any nausea vomiting swelling of the legs.   Review of Systems  Constitutional:  Positive for malaise/fatigue. Negative for chills, diaphoresis, fever and weight loss.  HENT:  Negative for nosebleeds and sore throat.   Eyes:  Negative for double vision.  Respiratory:  Positive for cough and shortness of breath. Negative for hemoptysis, sputum production and wheezing.   Cardiovascular:  Negative for chest pain, palpitations, orthopnea and leg swelling.  Gastrointestinal:  Negative for abdominal pain, blood in stool, constipation, diarrhea, heartburn, melena, nausea and vomiting.  Genitourinary:  Negative for dysuria, frequency and urgency.  Musculoskeletal:  Negative  for back pain and joint pain.  Skin: Negative.  Negative for itching and rash.  Neurological:  Positive for tingling. Negative for dizziness, focal weakness, weakness and headaches.  Endo/Heme/Allergies:  Does not bruise/bleed easily.  Psychiatric/Behavioral:  Negative for depression. The patient is nervous/anxious. The patient does not have insomnia.      PAST MEDICAL HISTORY :  Past Medical History:  Diagnosis Date   Abnormal CT scan, chest    Anxiety    Asthma    Breast cancer ([place])    Breast CA- Right    Breast cancer ([place]) 06/18/2011   right breast cancer   COPD (chronic obstructive pulmonary disease) (H[facility])    Endometriosis    tx with vaginal hysterectomy   Fatty liver    Hepatitis C 1998   [facility] trial treatment in-patient study 2005. HCV 740, 05/20/11.   History of substance abuse ([place])    Heroine, cocaine, marijuana. States all of them. Heavy use 1995 to 2000. Occasional use before and after. None since 2006.    Hot flashes related to aromatase inhibitor therapy    Hypertension    Lung mass    Peripheral neuropathy    in hands and feet    PAST SURGICAL HISTORY :   Past Surgical History:  Procedure Laterality Date   BREAST LUMPECTOMY Right 2013   ESOPHAGOGASTRODUODENOSCOPY (EGD) WITH PROPOFOL N/A 10/29/2021   Procedure: ESOPHAGOGASTRODUODENOSCOPY (EGD) WITH PROPOFOL;  Surgeon: [name], MD;  Location: [facility] ENDOSCOPY;  Service: Gastroenterology;  Laterality: N/A;   ESOPHAGOGASTRODUODENOSCOPY (EGD) WITH PROPOFOL N/A 02/14/2022   Procedure: ESOPHAGOGASTRODUODENOSCOPY (EGD) WITH PROPOFOL;  Surgeon: [name], MD;  Location: [place];  Service: Gastroenterology;  Laterality: N/A;  DRIVER 5 - [place]   Upper right leg benign tumor removed     VAGINAL HYSTERECTOMY  2001    FAMILY HISTORY :   Family History  Problem Relation Age of Onset   Diabetes Maternal Grandmother    Lung cancer Father    Lung cancer Mother     Hypertension Mother    Heart disease Mother    Skin cancer Mother    Breast cancer Neg Hx     SOCIAL HISTORY:   Social History   Tobacco Use   Smoking status: Every Day    Types: E-cigarettes   Smokeless tobacco: Never   Tobacco comments:    last cigeratte use 6 days ago (11/30/15)  Vaping Use   Vaping Use: Every day  Substance Use Topics   Alcohol use: Yes    Alcohol/week: 0.0 standard drinks of alcohol   Drug use: No    ALLERGIES:  is allergic to penicillins.  MEDICATIONS:  Current Outpatient Medications  Medication Sig Dispense Refill   albuterol (PROVENTIL) (2.5 MG/3ML) 0.083% nebulizer solution Take 2.5 mg by nebulization every 6 (six) hours as needed for wheezing.      DULoxetine (CYMBALTA) 30 MG capsule Take 1 capsule (30 mg total) by mouth 2 (two) times daily. 180 capsule 3   gabapentin (NEURONTIN) 300 MG capsule Take 1 capsule (300 mg total) by mouth 3 (three) times daily. 270 capsule 3   lamoTRIgine (LAMICTAL) 100 MG tablet Take 1 tablet by mouth daily.     metoprolol succinate (TOPROL-XL) 100 MG 24 hr tablet Take 100 mg by mouth daily. Take with or immediately following a meal.     Multiple Vitamin (MULTI-VITAMINS) TABS Take 1 tablet by mouth daily.      nicotine (NICODERM CQ - DOSED IN MG/24 HOURS) 21 mg/24hr patch Place 1 patch (21 mg total) onto the skin daily. (Patient not taking: Reported on 02/14/2022) 28 patch 0   omeprazole (PRILOSEC OTC) 20 MG tablet Take 2 tablets (40 mg total) by mouth daily before breakfast. 180 tablet 3   OXYGEN Inhale 2 L into the lungs at bedtime and may repeat dose one time if needed.      TRELEGY ELLIPTA 100-62.5-25 MCG/INH AEPB Inhale 1 puff into the lungs daily.     No current facility-administered medications for this visit.    PHYSICAL EXAMINATION: ECOG PERFORMANCE STATUS: 0 - Asymptomatic  There were no vitals taken for this visit.  There were no vitals filed for this visit.   Physical Exam Constitutional:       Comments: Appears cachectic.  Alone.  Nasal cannula oxygen.  Ambulating independently.  HENT:     Head: Normocephalic and atraumatic.     Mouth/Throat:     Pharynx: No oropharyngeal exudate.  Eyes:     Pupils: Pupils are equal, round, and reactive to light.  Cardiovascular:     Rate and Rhythm: Normal rate and regular rhythm.  Pulmonary:     Effort: No respiratory distress.     Breath sounds: No wheezing.     Comments: Decreased air entry bilaterally. Abdominal:     General: Bowel sounds are normal. There is no distension.     Palpations: Abdomen is soft. There is no mass.     Tenderness: There is no abdominal tenderness. There is no guarding or rebound.  Musculoskeletal:        General: No tenderness. Normal range  of motion.     Cervical back: Normal range of motion and neck supple.  Skin:    General: Skin is warm.     Comments: Right and left BREAST exam [in the presence of nurse]- no unusual skin changes or dominant masses felt. Surgical scars noted.    Neurological:     Mental Status: She is alert and oriented to person, place, and time.  Psychiatric:        Mood and Affect: Affect normal.        LABORATORY DATA:  I have reviewed the data as listed    Component Value Date/Time   NA 135 11/01/2021 0414   NA 133 (L) 07/04/2013 1216   K 4.4 11/01/2021 0414   K 3.5 07/04/2013 1216   CL 101 11/01/2021 0414   CL 99 07/04/2013 1216   CO2 30 11/01/2021 0414   CO2 30 07/04/2013 1216   GLUCOSE 88 11/01/2021 0414   GLUCOSE 100 (H) 07/04/2013 1216   BUN 17 11/01/2021 0414   BUN 15 07/04/2013 1216   CREATININE 0.76 11/01/2021 0414   CREATININE 0.83 10/12/2014 1239   CALCIUM 8.3 (L) 11/01/2021 0414   CALCIUM 8.8 07/04/2013 1216   PROT 6.6 10/28/2021 1858   PROT 8.5 (H) 10/12/2014 1239   ALBUMIN 2.6 (L) 10/28/2021 1858   ALBUMIN 4.1 10/12/2014 1239   AST 40 10/28/2021 1858   AST 44 (H) 10/12/2014 1239   ALT 16 10/28/2021 1858   ALT 28 10/12/2014 1239   ALKPHOS 96  10/28/2021 1858   ALKPHOS 69 10/12/2014 1239   BILITOT 0.6 10/28/2021 1858   BILITOT 0.6 10/12/2014 1239   GFRNONAA >60 11/01/2021 0414   GFRNONAA >60 10/12/2014 1239   GFRAA >60 12/21/2019 1010   GFRAA >60 10/12/2014 1239    No results found for: "SPEP", "UPEP"  Lab Results  Component Value Date   WBC 7.1 11/01/2021   NEUTROABS 4.4 10/28/2021   HGB 7.6 (L) 11/01/2021   HCT 23.6 (L) 11/01/2021   MCV 100.4 (H) 11/01/2021   PLT 296 11/01/2021      Chemistry      Component Value Date/Time   NA 135 11/01/2021 0414   NA 133 (L) 07/04/2013 1216   K 4.4 11/01/2021 0414   K 3.5 07/04/2013 1216   CL 101 11/01/2021 0414   CL 99 07/04/2013 1216   CO2 30 11/01/2021 0414   CO2 30 07/04/2013 1216   BUN 17 11/01/2021 0414   BUN 15 07/04/2013 1216   CREATININE 0.76 11/01/2021 0414   CREATININE 0.83 10/12/2014 1239      Component Value Date/Time   CALCIUM 8.3 (L) 11/01/2021 0414   CALCIUM 8.8 07/04/2013 1216   ALKPHOS 96 10/28/2021 1858   ALKPHOS 69 10/12/2014 1239   AST 40 10/28/2021 1858   AST 44 (H) 10/12/2014 1239   ALT 16 10/28/2021 1858   ALT 28 10/12/2014 1239   BILITOT 0.6 10/28/2021 1858   BILITOT 0.6 10/12/2014 1239         ASSESSMENT & PLAN:  Carcinoma of overlapping sites of right breast in female, estrogen receptor positive ([place]) # RIGHT BREAST CA STAGE II ER/PR- Pos; her 2 NEG; clinically no evidence of recurrence noted; STOPPED  aromatase inhibitor Secondary to intolerance. mammo-dec 2020- NEG- STABLE. Will need ordered today.   # Left upper lobe lung nodule-~ 1.5cm; stable [previously 1.4cm]recommend follow up CT scab.  # PN-2-3-improved; on  Neurontin to 300 mg 3 times a day [refilled]; continue  duloxetine.  Stable  # Anxiety/ depression/ memory issues-stable  # Cirrhosis- on CT-/Macrocytosis noted likely from alcohol.  Stable.  Ultrasound liver negative for any mass.    #  DISPOSITION #  bil screening mammogram 1-2 weeks # CT scan chest in 1-2  weeks # Follow up in 6 months-MD- labs- cbc/cmp/AFP;-Dr.B   Cc; Dr.Fleming     Cammie Sickle, MD 03/12/2022 1:36 PM

## 2022-03-12 NOTE — Assessment & Plan Note (Deleted)
#   RIGHT BREAST CA STAGE II ER/PR- Pos; her 2 NEG; clinically no evidence of recurrence noted; STOPPED  aromatase inhibitor Secondary to intolerance. mammo-dec 2020- NEG- STABLE. Will need ordered today.   # Left upper lobe lung nodule-~ 1.5cm; stable [previously 1.4cm]recommend follow up CT scab.  # PN-2-3-improved; on  Neurontin to 300 mg 3 times a day [refilled]; continue duloxetine.  Stable  # Anxiety/ depression/ memory issues-stable  # Cirrhosis- on CT-/Macrocytosis noted likely from alcohol.  Stable.  Ultrasound liver negative for any mass.    #  DISPOSITION #  bil screening mammogram 1-2 weeks # CT scan chest in 1-2 weeks # Follow up in 6 months-MD- labs- cbc/cmp/AFP;-Dr.B   Cc; Dr.Fleming

## 2022-03-13 ENCOUNTER — Ambulatory Visit: Payer: Self-pay | Admitting: Gastroenterology

## 2022-03-13 ENCOUNTER — Other Ambulatory Visit: Payer: Self-pay

## 2022-03-18 ENCOUNTER — Telehealth: Payer: Self-pay | Admitting: Gastroenterology

## 2022-03-18 NOTE — Telephone Encounter (Signed)
Patient left vm stating she needs someone to call her about her colonoscopy. Her colonoscopy is on 03/20/2022. States she needs a call back today.

## 2022-03-18 NOTE — Telephone Encounter (Signed)
Pt was advised that she could still have her colonoscopy on 03/20/22 even though she no showed for her office appt.

## 2022-03-20 ENCOUNTER — Ambulatory Visit: Payer: Medicare Other | Admitting: Anesthesiology

## 2022-03-20 ENCOUNTER — Other Ambulatory Visit: Payer: Self-pay

## 2022-03-20 ENCOUNTER — Encounter: Payer: Self-pay | Admitting: Gastroenterology

## 2022-03-20 ENCOUNTER — Ambulatory Visit
Admission: RE | Admit: 2022-03-20 | Discharge: 2022-03-20 | Disposition: A | Discharge status: Home / Self Care | Payer: Medicare Other | Attending: Gastroenterology | Admitting: Gastroenterology

## 2022-03-20 ENCOUNTER — Encounter
Admission: RE | Disposition: A | Discharge status: Home / Self Care | Payer: Self-pay | Source: Home / Self Care | Attending: Gastroenterology

## 2022-03-20 DIAGNOSIS — Z87891 Personal history of nicotine dependence: Secondary | ICD-10-CM | POA: Insufficient documentation

## 2022-03-20 DIAGNOSIS — K529 Noninfective gastroenteritis and colitis, unspecified: Secondary | ICD-10-CM | POA: Insufficient documentation

## 2022-03-20 DIAGNOSIS — K633 Ulcer of intestine: Secondary | ICD-10-CM

## 2022-03-20 DIAGNOSIS — G8929 Other chronic pain: Secondary | ICD-10-CM | POA: Diagnosis not present

## 2022-03-20 DIAGNOSIS — K644 Residual hemorrhoidal skin tags: Secondary | ICD-10-CM | POA: Diagnosis not present

## 2022-03-20 DIAGNOSIS — Z1211 Encounter for screening for malignant neoplasm of colon: Secondary | ICD-10-CM

## 2022-03-20 DIAGNOSIS — I10 Essential (primary) hypertension: Secondary | ICD-10-CM | POA: Diagnosis not present

## 2022-03-20 DIAGNOSIS — M549 Dorsalgia, unspecified: Secondary | ICD-10-CM | POA: Insufficient documentation

## 2022-03-20 DIAGNOSIS — J449 Chronic obstructive pulmonary disease, unspecified: Secondary | ICD-10-CM | POA: Diagnosis not present

## 2022-03-20 HISTORY — PX: COLONOSCOPY WITH PROPOFOL: SHX5780

## 2022-03-20 SURGERY — COLONOSCOPY WITH PROPOFOL
Anesthesia: General

## 2022-03-20 MED ORDER — SODIUM CHLORIDE 0.9 % IV SOLN: INTRAVENOUS | Status: DC

## 2022-03-20 MED ORDER — PROPOFOL 10 MG/ML IV BOLUS
INTRAVENOUS | Status: AC
  Filled 2022-03-20: qty 20

## 2022-03-20 MED ORDER — LIDOCAINE HCL (CARDIAC) PF 100 MG/5ML IV SOSY
PREFILLED_SYRINGE | INTRAVENOUS | Status: DC | PRN
  Administered 2022-03-20: 50 mg via INTRAVENOUS

## 2022-03-20 MED ORDER — PROPOFOL 10 MG/ML IV BOLUS
INTRAVENOUS | Status: DC | PRN
  Administered 2022-03-20: 60 mg via INTRAVENOUS
  Administered 2022-03-20: 10 mg via INTRAVENOUS

## 2022-03-20 MED ORDER — PROPOFOL 1000 MG/100ML IV EMUL
INTRAVENOUS | Status: AC
  Filled 2022-03-20: qty 100

## 2022-03-20 MED ORDER — PROPOFOL 500 MG/50ML IV EMUL
INTRAVENOUS | Status: DC | PRN
  Administered 2022-03-20: 150 ug/kg/min via INTRAVENOUS

## 2022-03-20 NOTE — Transfer of Care (Signed)
Immediate Anesthesia Transfer of Care Note  Patient: Regina Bernard  Procedure(s) Performed: COLONOSCOPY WITH PROPOFOL  Patient Location: Endoscopy Unit  Anesthesia Type:General  Level of Consciousness: drowsy  Airway & Oxygen Therapy: Patient Spontanous Breathing and Patient connected to face mask oxygen  Post-op Assessment: Report given to RN and Post -op Vital signs reviewed and stable  Post vital signs: Reviewed and stable  Last Vitals:  Vitals Value Taken Time  BP 109/60 03/20/22 0845  Temp 36.1 C 03/20/22 0845  Pulse 89 03/20/22 0845  Resp 15 03/20/22 0845  SpO2 100 % 03/20/22 0845    Last Pain:  Vitals:   03/20/22 0845  TempSrc: Temporal  PainSc: Asleep         Complications: No notable events documented.

## 2022-03-20 NOTE — Anesthesia Postprocedure Evaluation (Signed)
Anesthesia Post Note  Patient: Regina Bernard  Procedure(s) Performed: COLONOSCOPY WITH PROPOFOL  Patient location during evaluation: Endoscopy Anesthesia Type: General Level of consciousness: awake and alert Pain management: pain level controlled Vital Signs Assessment: post-procedure vital signs reviewed and stable Respiratory status: spontaneous breathing, nonlabored ventilation, respiratory function stable and patient connected to nasal cannula oxygen Cardiovascular status: blood pressure returned to baseline and stable Postop Assessment: no apparent nausea or vomiting Anesthetic complications: no   No notable events documented.   Last Vitals:  Vitals:   03/20/22 0745 03/20/22 0845  BP: (!) 142/98 109/60  Pulse: (!) 115 89  Resp: 18 15  Temp: (!) 35.9 C (!) 36.1 C  SpO2: 96% 100%    Last Pain:  Vitals:   03/20/22 0845  TempSrc: Temporal  PainSc: Asleep                 Ilene Qua

## 2022-03-20 NOTE — Op Note (Signed)
[facility] Gastroenterology Patient Name: Regina Bernard Procedure Date: 03/20/2022 8:07 AM MRN: [id number] Account #: [id number] Date of Birth: 02/09/1956 Admit Type: Outpatient Age: 66 Room: [facility] ENDO ROOM 4 Gender: Female Note Status: Finalized Instrument Name: Peds Colonoscope [id number] Procedure:             Colonoscopy Indications:           Screening for colorectal malignant neoplasm, This is                         the patient's first colonoscopy Providers:             [name] MD, MD Referring MD:          Youlanda Roys. Lovie Macadamia, MD (Referring MD) Medicines:             General Anesthesia Complications:         No immediate complications. Estimated blood loss: None. Procedure:             Pre-Anesthesia Assessment:                        - Prior to the procedure, a History and Physical was                         performed, and patient medications and allergies were                         reviewed. The patient is competent. The risks and                         benefits of the procedure and the sedation options and                         risks were discussed with the patient. All questions                         were answered and informed consent was obtained.                         Patient identification and proposed procedure were                         verified by the physician, the nurse, the                         anesthesiologist, the anesthetist and the technician                         in the pre-procedure area in the procedure room in the                         endoscopy suite. Mental Status Examination: alert and                         oriented. Airway Examination: normal oropharyngeal                         airway and neck mobility. Respiratory Examination:  clear to auscultation. CV Examination: normal.                         Prophylactic Antibiotics: The patient does not require                          prophylactic antibiotics. Prior Anticoagulants: The                         patient has taken no previous anticoagulant or                         antiplatelet agents. ASA Grade Assessment: III - A                         patient with severe systemic disease. After reviewing                         the risks and benefits, the patient was deemed in                         satisfactory condition to undergo the procedure. The                         anesthesia plan was to use general anesthesia.                         Immediately prior to administration of medications,                         the patient was re-assessed for adequacy to receive                         sedatives. The heart rate, respiratory rate, oxygen                         saturations, blood pressure, adequacy of pulmonary                         ventilation, and response to care were monitored                         throughout the procedure. The physical status of the                         patient was re-assessed after the procedure.                        After obtaining informed consent, the colonoscope was                         passed under direct vision. Throughout the procedure,                         the patient's blood pressure, pulse, and oxygen                         saturations were monitored continuously. The  Colonoscope was introduced through the anus and                         advanced to the 10 cm into the ileum. The colonoscopy                         was performed with moderate difficulty due to                         significant looping and a tortuous colon. Successful                         completion of the procedure was aided by applying                         abdominal pressure. The patient tolerated the                         procedure well. The quality of the bowel preparation                         was evaluated using the BBPS [facility] Bowel Preparation                          Scale) with scores of: Right Colon = 3, Transverse                         Colon = 3 and Left Colon = 3 (entire mucosa seen well                         with no residual staining, small fragments of stool or                         opaque liquid). The total BBPS score equals 9. Findings:      Skin tags were found on perianal exam.      The terminal ileum appeared normal. Biopsies were taken with a cold       forceps for histology.      A single (solitary) five mm ulcer was found at the ileocecal valve. No       bleeding was present. No stigmata of recent bleeding were seen. Biopsies       were taken with a cold forceps for histology. Estimated blood loss: none.      The colon (entire examined portion) appeared normal.      Non-bleeding external hemorrhoids were found during retroflexion. The       hemorrhoids were medium-sized. Impression:            - Perianal skin tags found on perianal exam.                        - The examined portion of the ileum was normal.                         Biopsied.                        - A single (solitary) ulcer at the ileocecal valve.  Biopsied.                        - The entire examined colon is normal.                        - Non-bleeding external hemorrhoids. Recommendation:        - Discharge patient to home (with escort).                        - Resume previous diet today.                        - Continue present medications.                        - Await pathology results.                        - Repeat colonoscopy in 10 years for screening                         purposes. Procedure Code(s):     --- Professional ---                        [phone], Colonoscopy, flexible; with biopsy, single or                         multiple Diagnosis Code(s):     --- Professional ---                        K64.4, Residual hemorrhoidal skin tags                        Z12.11, Encounter for screening for malignant  neoplasm                         of colon                        K63.3, Ulcer of intestine CPT copyright 2019 American Medical Association. All rights reserved. The codes documented in this report are preliminary and upon coder review may  be revised to meet current compliance requirements. Dr. Ulyess Mort [name] MD, MD 03/20/2022 8:44:27 AM This report has been signed electronically. Number of Addenda: 0 Note Initiated On: 03/20/2022 8:07 AM Scope Withdrawal Time: 0 hours 9 minutes 45 seconds  Total Procedure Duration: 0 hours 16 minutes 0 seconds  Estimated Blood Loss:  Estimated blood loss: none.      [facility]

## 2022-03-20 NOTE — Anesthesia Preprocedure Evaluation (Signed)
Anesthesia Evaluation  Patient identified by MRN, date of birth, ID band Patient awake    Reviewed: Allergy & Precautions, NPO status , Patient's Chart, lab work & pertinent test results  History of Anesthesia Complications Negative for: history of anesthetic complications  Airway Mallampati: I  TM Distance: >3 FB Neck ROM: Full    Dental   Multiple missing molars bilaterally  :   Pulmonary asthma , COPD,  oxygen dependent, Patient abstained from smoking., former smoker,  2l home O2   Pulmonary exam normal breath sounds clear to auscultation       Cardiovascular Exercise Tolerance: Good hypertension, Pt. on medications Normal cardiovascular exam Rhythm:Regular Rate:Normal     Neuro/Psych PSYCHIATRIC DISORDERS Anxiety  Neuromuscular disease (peripheral neuropathy 2/2 chemo )    GI/Hepatic PUD, (+) Cirrhosis       , Hepatitis -, C  Endo/Other  negative endocrine ROS  Renal/GU negative Renal ROS  negative genitourinary   Musculoskeletal Chronic back pain, currently 7-8/10   Abdominal   Peds negative pediatric ROS (+)  Hematology  (+) Blood dyscrasia, ,   Anesthesia Other Findings   Reproductive/Obstetrics negative OB ROS                             Anesthesia Physical Anesthesia Plan  ASA: 3  Anesthesia Plan: General   Post-op Pain Management: Minimal or no pain anticipated   Induction: Intravenous  PONV Risk Score and Plan: 2 and Propofol infusion  Airway Management Planned: Natural Airway and Nasal Cannula  Additional Equipment:   Intra-op Plan:   Post-operative Plan:   Informed Consent: I have reviewed the patients History and Physical, chart, labs and discussed the procedure including the risks, benefits and alternatives for the proposed anesthesia with the patient or authorized representative who has indicated his/her understanding and acceptance.     Dental  Advisory Given  Plan Discussed with: Anesthesiologist, CRNA and Surgeon  Anesthesia Plan Comments: (Patient consented for risks of anesthesia including but not limited to:  - adverse reactions to medications - risk of airway placement if required - damage to eyes, teeth, lips or other oral mucosa - nerve damage due to positioning  - sore throat or hoarseness - Damage to heart, brain, nerves, lungs, other parts of body or loss of life  Patient voiced understanding.)        Anesthesia Quick Evaluation

## 2022-03-20 NOTE — Anesthesia Procedure Notes (Signed)
Date/Time: 03/20/2022 8:20 AM  Performed by: Loletha Grayer, CRNAPre-anesthesia Checklist: Patient identified, Emergency Drugs available, Suction available, Patient being monitored and Timeout performed Patient Re-evaluated:Patient Re-evaluated prior to induction Oxygen Delivery Method: Simple face mask

## 2022-03-20 NOTE — H&P (Signed)
Cephas Darby, MD [address]  [place]  [place], [place] [address]  Main: [phone]  Fax: [phone] Pager: [phone]  Primary Care Physician:  Juluis Pitch, MD Primary Gastroenterologist:  Dr. Cephas Darby  Pre-Procedure History & Physical: HPI:  Regina Bernard is a 66 y.o. female is here for an colonoscopy.   Past Medical History:  Diagnosis Date   Abnormal CT scan, chest    Anxiety    Asthma    Breast cancer ([place])    Breast CA- Right    Breast cancer ([place]) 06/18/2011   right breast cancer   COPD (chronic obstructive pulmonary disease) ([facility])    Endometriosis    tx with vaginal hysterectomy   Fatty liver    Hepatitis C 1998   [facility] trial treatment in-patient study 2005. HCV 740, 05/20/11.   History of substance abuse ([place])    Heroine, cocaine, marijuana. States all of them. Heavy use 1995 to 2000. Occasional use before and after. None since 2006.    Hot flashes related to aromatase inhibitor therapy    Hypertension    Lung mass    Peripheral neuropathy    in hands and feet    Past Surgical History:  Procedure Laterality Date   BREAST LUMPECTOMY Right 2013   ESOPHAGOGASTRODUODENOSCOPY (EGD) WITH PROPOFOL N/A 10/29/2021   Procedure: ESOPHAGOGASTRODUODENOSCOPY (EGD) WITH PROPOFOL;  Surgeon: [name], MD;  Location: [facility] ENDOSCOPY;  Service: Gastroenterology;  Laterality: N/A;   ESOPHAGOGASTRODUODENOSCOPY (EGD) WITH PROPOFOL N/A 02/14/2022   Procedure: ESOPHAGOGASTRODUODENOSCOPY (EGD) WITH PROPOFOL;  Surgeon: [name], MD;  Location: [facility] ENDOSCOPY;  Service: Gastroenterology;  Laterality: N/A;  DRIVER 5 - [place]   Upper right leg benign tumor removed     VAGINAL HYSTERECTOMY  2001    Prior to Admission medications   Medication Sig Start Date End Date Taking? Authorizing Provider  albuterol (PROVENTIL) (2.5 MG/3ML) 0.083% nebulizer solution Take 2.5 mg by nebulization every 6 (six) hours as  needed for wheezing.  09/16/14   [provider]  diazepam (VALIUM) 2 MG tablet SMARTSIG:1 Tablet(s) By Mouth 10/25/21   [provider]  DULoxetine (CYMBALTA) 30 MG capsule Take 1 capsule (30 mg total) by mouth 2 (two) times daily. 09/19/17   Cammie Sickle, MD  gabapentin (NEURONTIN) 300 MG capsule Take 1 capsule (300 mg total) by mouth 3 (three) times daily. 10/25/20   Cammie Sickle, MD  HYDROcodone-acetaminophen (NORCO/VICODIN) 5-325 MG tablet Take 1 tablet by mouth 2 (two) times daily as needed. 02/06/22   [provider]  lamoTRIgine (LAMICTAL) 100 MG tablet Take by mouth. 11/07/21   [provider]  metoprolol succinate (TOPROL-XL) 100 MG 24 hr tablet Take 100 mg by mouth daily. Take with or immediately following a meal.    [provider]  Multiple Vitamin (MULTI-VITAMINS) TABS Take 1 tablet by mouth daily.     [provider]  nicotine (NICODERM CQ - DOSED IN MG/24 HOURS) 21 mg/24hr patch Place 1 patch (21 mg total) onto the skin daily. Patient not taking: Reported on 02/14/2022 11/01/21   Enzo Bi, MD  omeprazole (PRILOSEC) 20 MG capsule Take 40 mg by mouth every morning. 02/14/22   [provider]  OXYGEN Inhale 2 L into the lungs at bedtime and may repeat dose one time if needed.     [provider]  QUEtiapine (SEROQUEL) 25 MG tablet Take by mouth. 08/24/21   [provider]  SENEXON-S 8.6-50 MG tablet Take 2 tablets by mouth 2 (two) times daily. 12/18/21   [provider]  TRELEGY ELLIPTA 100-62.5-25 MCG/INH AEPB Inhale 1 puff into the lungs daily. 11/06/18   [provider]    Allergies as of 02/18/2022 - Review Complete 02/14/2022  Allergen Reaction Noted   Penicillins Swelling and Rash 01/17/2015    Family History  Problem Relation Age of Onset   Diabetes Maternal Grandmother    Lung cancer Father    Lung cancer Mother    Hypertension Mother    Heart disease Mother    Skin  cancer Mother    Breast cancer Neg Hx     Social History   Socioeconomic History   Marital status: Married    Spouse name: Not on file   Number of children: Not on file   Years of education: Not on file   Highest education level: Not on file  Occupational History   Not on file  Tobacco Use   Smoking status: Former    Types: E-cigarettes    Quit date: 09/2020    Years since quitting: 1.5   Smokeless tobacco: Never   Tobacco comments:    last cigeratte use 6 days ago (11/30/15)  Vaping Use   Vaping Use: Every day  Substance and Sexual Activity   Alcohol use: Yes    Comment: occ   Drug use: No   Sexual activity: Never  Other Topics Concern   Not on file  Social History Narrative   Not on file   Social Determinants of Health   Financial Resource Strain: Not on file  Food Insecurity: Not on file  Transportation Needs: Not on file  Physical Activity: Not on file  Stress: Not on file  Social Connections: Not on file  Intimate Partner Violence: Not on file    Review of Systems: See HPI, otherwise negative ROS  Physical Exam: BP (!) 142/98   Pulse (!) 115   Temp (!) 96.7 F (35.9 C) (Temporal)   Resp 18   Ht 5' 3.5" (1.613 m)   Wt 56.2 kg   SpO2 96%   BMI 21.62 kg/m  General:   Alert,  pleasant and cooperative in NAD Head:  Normocephalic and atraumatic. Neck:  Supple; no masses or thyromegaly. Lungs:  Clear throughout to auscultation.    Heart:  Regular rate and rhythm. Abdomen:  Soft, nontender and nondistended. Normal bowel sounds, without guarding, and without rebound.   Neurologic:  Alert and  oriented x4;  grossly normal neurologically.  Impression/Plan: Regina Bernard is here for an colonoscopy to be performed for colon cancer screening  Risks, benefits, limitations, and alternatives regarding  colonoscopy have been reviewed with the patient.  Questions have been answered.  All parties agreeable.   Sherri Sear, MD  03/20/2022, 8:09 AM

## 2022-03-22 LAB — SURGICAL PATHOLOGY

## 2022-03-26 ENCOUNTER — Ambulatory Visit: Payer: Medicare Other | Admitting: Internal Medicine

## 2022-03-26 ENCOUNTER — Other Ambulatory Visit: Payer: Medicare Other

## 2022-03-29 ENCOUNTER — Telehealth: Payer: Self-pay | Admitting: *Deleted

## 2022-03-29 NOTE — Telephone Encounter (Signed)
Patient called asking for her labs to be drawn at [facility] and is asking if we can send order noe for her appointment that was changed because she has to get labs drawn for PCP. Please return her call to discuss thsi

## 2022-03-29 NOTE — Telephone Encounter (Signed)
Spoke to patient, she request lab order to be drawn at [facility].  Order form completed and will be mailed to patient when signed by MD.

## 2022-04-30 ENCOUNTER — Inpatient Hospital Stay: Payer: Medicare Other | Admitting: Internal Medicine

## 2022-04-30 ENCOUNTER — Inpatient Hospital Stay: Payer: Medicare Other | Attending: Internal Medicine

## 2022-04-30 ENCOUNTER — Telehealth: Payer: Self-pay | Admitting: *Deleted

## 2022-04-30 NOTE — Telephone Encounter (Signed)
Patient called stating that she never mad eit to [place] to get her blood drawn for apt today and she is asking for Dr B to order it for her to be done in office today when she come in  Her appointment is for 3:15 today

## 2022-04-30 NOTE — Assessment & Plan Note (Deleted)
#   RIGHT BREAST CA STAGE II ER/PR- Pos; her 2 NEG; clinically no evidence of recurrence noted; STOPPED  aromatase inhibitor Secondary to intolerance. mammo-dec 2020- NEG- STABLE. Will need ordered today.   # Left upper lobe lung nodule-~ 1.5cm; stable [previously 1.4cm]recommend follow up CT scab.  # PN-2-3-improved; on  Neurontin to 300 mg 3 times a day [refilled]; continue duloxetine.  Stable  # Anxiety/ depression/ memory issues-stable  # Cirrhosis- on CT-/Macrocytosis noted likely from alcohol.  Stable.  Ultrasound liver negative for any mass.    #  DISPOSITION #  bil screening mammogram 1-2 weeks # CT scan chest in 1-2 weeks # Follow up in 6 months-MD- labs- cbc/cmp/AFP;-Dr.B   Cc; Dr.Fleming

## 2022-05-06 ENCOUNTER — Other Ambulatory Visit: Payer: Self-pay | Admitting: Specialist

## 2022-05-06 DIAGNOSIS — R0602 Shortness of breath: Secondary | ICD-10-CM

## 2022-05-06 DIAGNOSIS — R634 Abnormal weight loss: Secondary | ICD-10-CM

## 2022-05-15 ENCOUNTER — Ambulatory Visit
Admission: RE | Admit: 2022-05-15 | Discharge: 2022-05-15 | Disposition: A | Discharge status: Home / Self Care | Payer: Medicare Other | Source: Ambulatory Visit | Attending: Specialist | Admitting: Specialist

## 2022-05-15 DIAGNOSIS — R634 Abnormal weight loss: Secondary | ICD-10-CM

## 2022-05-15 DIAGNOSIS — R0602 Shortness of breath: Secondary | ICD-10-CM

## 2022-06-24 ENCOUNTER — Telehealth: Payer: Self-pay | Admitting: Gastroenterology

## 2022-06-24 NOTE — Telephone Encounter (Signed)
Patient has never seen [place] in the office and has only seen Dr. Marius Ditch in the hospital. She no showed her appointment  In September. Informed patient since she has never been seen and no showed her appointment she needs to call her PCP or go to the ER. She asked if we could see her today informed her no the next available is not till April. She understood and states she will call her PCP

## 2022-06-24 NOTE — Telephone Encounter (Signed)
Patient calling needing some medical advice. States she cannot get out of the bed and is very sick. She mentioned that this same thing happened about a year ago and she is wanting to know what she needs to do. Requests a call back.

## 2022-06-28 ENCOUNTER — Inpatient Hospital Stay
Admission: EM | Admit: 2022-06-28 | Discharge: 2022-07-06 | DRG: 871 | Disposition: A | Discharge status: Home Health | Payer: Medicare Other | Attending: Internal Medicine | Admitting: Internal Medicine

## 2022-06-28 ENCOUNTER — Emergency Department: Payer: Medicare Other

## 2022-06-28 ENCOUNTER — Inpatient Hospital Stay: Payer: Medicare Other

## 2022-06-28 DIAGNOSIS — J850 Gangrene and necrosis of lung: Secondary | ICD-10-CM | POA: Diagnosis not present

## 2022-06-28 DIAGNOSIS — Z9981 Dependence on supplemental oxygen: Secondary | ICD-10-CM

## 2022-06-28 DIAGNOSIS — J439 Emphysema, unspecified: Secondary | ICD-10-CM | POA: Diagnosis present

## 2022-06-28 DIAGNOSIS — Z7952 Long term (current) use of systemic steroids: Secondary | ICD-10-CM

## 2022-06-28 DIAGNOSIS — A419 Sepsis, unspecified organism: Secondary | ICD-10-CM | POA: Diagnosis present

## 2022-06-28 DIAGNOSIS — Z8249 Family history of ischemic heart disease and other diseases of the circulatory system: Secondary | ICD-10-CM

## 2022-06-28 DIAGNOSIS — M549 Dorsalgia, unspecified: Secondary | ICD-10-CM | POA: Diagnosis present

## 2022-06-28 DIAGNOSIS — N179 Acute kidney failure, unspecified: Secondary | ICD-10-CM | POA: Diagnosis present

## 2022-06-28 DIAGNOSIS — I10 Essential (primary) hypertension: Secondary | ICD-10-CM | POA: Diagnosis present

## 2022-06-28 DIAGNOSIS — Z79899 Other long term (current) drug therapy: Secondary | ICD-10-CM

## 2022-06-28 DIAGNOSIS — Z853 Personal history of malignant neoplasm of breast: Secondary | ICD-10-CM

## 2022-06-28 DIAGNOSIS — E8809 Other disorders of plasma-protein metabolism, not elsewhere classified: Secondary | ICD-10-CM | POA: Diagnosis present

## 2022-06-28 DIAGNOSIS — E440 Moderate protein-calorie malnutrition: Secondary | ICD-10-CM | POA: Diagnosis present

## 2022-06-28 DIAGNOSIS — E860 Dehydration: Secondary | ICD-10-CM | POA: Diagnosis present

## 2022-06-28 DIAGNOSIS — Z8711 Personal history of peptic ulcer disease: Secondary | ICD-10-CM

## 2022-06-28 DIAGNOSIS — Z87891 Personal history of nicotine dependence: Secondary | ICD-10-CM

## 2022-06-28 DIAGNOSIS — J9621 Acute and chronic respiratory failure with hypoxia: Secondary | ICD-10-CM | POA: Diagnosis present

## 2022-06-28 DIAGNOSIS — R6521 Severe sepsis with septic shock: Secondary | ICD-10-CM | POA: Diagnosis present

## 2022-06-28 DIAGNOSIS — Z808 Family history of malignant neoplasm of other organs or systems: Secondary | ICD-10-CM

## 2022-06-28 DIAGNOSIS — Z8619 Personal history of other infectious and parasitic diseases: Secondary | ICD-10-CM

## 2022-06-28 DIAGNOSIS — E871 Hypo-osmolality and hyponatremia: Secondary | ICD-10-CM | POA: Diagnosis present

## 2022-06-28 DIAGNOSIS — Z833 Family history of diabetes mellitus: Secondary | ICD-10-CM

## 2022-06-28 DIAGNOSIS — K7460 Unspecified cirrhosis of liver: Secondary | ICD-10-CM | POA: Diagnosis present

## 2022-06-28 DIAGNOSIS — Z1152 Encounter for screening for COVID-19: Secondary | ICD-10-CM | POA: Diagnosis not present

## 2022-06-28 DIAGNOSIS — R627 Adult failure to thrive: Secondary | ICD-10-CM | POA: Diagnosis present

## 2022-06-28 DIAGNOSIS — J441 Chronic obstructive pulmonary disease with (acute) exacerbation: Secondary | ICD-10-CM | POA: Diagnosis not present

## 2022-06-28 DIAGNOSIS — J690 Pneumonitis due to inhalation of food and vomit: Secondary | ICD-10-CM | POA: Diagnosis present

## 2022-06-28 DIAGNOSIS — J9601 Acute respiratory failure with hypoxia: Secondary | ICD-10-CM | POA: Diagnosis present

## 2022-06-28 DIAGNOSIS — J189 Pneumonia, unspecified organism: Secondary | ICD-10-CM

## 2022-06-28 DIAGNOSIS — E8729 Other acidosis: Secondary | ICD-10-CM | POA: Diagnosis present

## 2022-06-28 DIAGNOSIS — Z23 Encounter for immunization: Secondary | ICD-10-CM

## 2022-06-28 DIAGNOSIS — K920 Hematemesis: Secondary | ICD-10-CM | POA: Diagnosis present

## 2022-06-28 DIAGNOSIS — D649 Anemia, unspecified: Secondary | ICD-10-CM | POA: Diagnosis present

## 2022-06-28 DIAGNOSIS — F419 Anxiety disorder, unspecified: Secondary | ICD-10-CM | POA: Diagnosis present

## 2022-06-28 DIAGNOSIS — Z801 Family history of malignant neoplasm of trachea, bronchus and lung: Secondary | ICD-10-CM

## 2022-06-28 DIAGNOSIS — K7469 Other cirrhosis of liver: Secondary | ICD-10-CM | POA: Diagnosis not present

## 2022-06-28 DIAGNOSIS — Z880 Allergy status to penicillin: Secondary | ICD-10-CM

## 2022-06-28 DIAGNOSIS — K760 Fatty (change of) liver, not elsewhere classified: Secondary | ICD-10-CM | POA: Diagnosis present

## 2022-06-28 DIAGNOSIS — J440 Chronic obstructive pulmonary disease with acute lower respiratory infection: Secondary | ICD-10-CM | POA: Diagnosis present

## 2022-06-28 DIAGNOSIS — R531 Weakness: Secondary | ICD-10-CM

## 2022-06-28 DIAGNOSIS — Z90710 Acquired absence of both cervix and uterus: Secondary | ICD-10-CM

## 2022-06-28 DIAGNOSIS — R54 Age-related physical debility: Secondary | ICD-10-CM | POA: Diagnosis present

## 2022-06-28 DIAGNOSIS — K7030 Alcoholic cirrhosis of liver without ascites: Secondary | ICD-10-CM

## 2022-06-28 DIAGNOSIS — F32A Depression, unspecified: Secondary | ICD-10-CM | POA: Diagnosis present

## 2022-06-28 DIAGNOSIS — Z9221 Personal history of antineoplastic chemotherapy: Secondary | ICD-10-CM

## 2022-06-28 DIAGNOSIS — G629 Polyneuropathy, unspecified: Secondary | ICD-10-CM | POA: Diagnosis present

## 2022-06-28 DIAGNOSIS — Z6823 Body mass index (BMI) 23.0-23.9, adult: Secondary | ICD-10-CM

## 2022-06-28 DIAGNOSIS — B965 Pseudomonas (aeruginosa) (mallei) (pseudomallei) as the cause of diseases classified elsewhere: Secondary | ICD-10-CM | POA: Diagnosis present

## 2022-06-28 DIAGNOSIS — R739 Hyperglycemia, unspecified: Secondary | ICD-10-CM | POA: Diagnosis not present

## 2022-06-28 DIAGNOSIS — J9622 Acute and chronic respiratory failure with hypercapnia: Secondary | ICD-10-CM | POA: Diagnosis present

## 2022-06-28 DIAGNOSIS — G8929 Other chronic pain: Secondary | ICD-10-CM | POA: Diagnosis present

## 2022-06-28 HISTORY — DX: Acute kidney failure, unspecified: N17.9

## 2022-06-28 HISTORY — DX: Pneumonitis due to inhalation of food and vomit: J69.0

## 2022-06-28 HISTORY — DX: Acute respiratory failure with hypoxia: J96.01

## 2022-06-28 LAB — BLOOD GAS, VENOUS
Acid-base deficit: 1.8 mmol/L (ref 0.0–2.0)
Bicarbonate: 25.8 mmol/L (ref 20.0–28.0)
O2 Content: 5 L/min
O2 Saturation: 80 %
Patient temperature: 37
pCO2, Ven: 55 mmHg (ref 44–60)
pH, Ven: 7.28 (ref 7.25–7.43)
pO2, Ven: 53 mmHg — ABNORMAL HIGH (ref 32–45)

## 2022-06-28 LAB — CBC WITH DIFFERENTIAL/PLATELET
Abs Immature Granulocytes: 1.35 10*3/uL — ABNORMAL HIGH (ref 0.00–0.07)
Basophils Absolute: 0.1 10*3/uL (ref 0.0–0.1)
Basophils Relative: 0 %
Eosinophils Absolute: 0.1 10*3/uL (ref 0.0–0.5)
Eosinophils Relative: 0 %
HCT: 30.3 % — ABNORMAL LOW (ref 36.0–46.0)
Hemoglobin: 9.9 g/dL — ABNORMAL LOW (ref 12.0–15.0)
Immature Granulocytes: 3 %
Lymphocytes Relative: 2 %
Lymphs Abs: 0.9 10*3/uL (ref 0.7–4.0)
MCH: 31.8 pg (ref 26.0–34.0)
MCHC: 32.7 g/dL (ref 30.0–36.0)
MCV: 97.4 fL (ref 80.0–100.0)
Monocytes Absolute: 1.5 10*3/uL — ABNORMAL HIGH (ref 0.1–1.0)
Monocytes Relative: 3 %
Neutro Abs: 43.2 10*3/uL — ABNORMAL HIGH (ref 1.7–7.7)
Neutrophils Relative %: 92 %
Platelets: 520 10*3/uL — ABNORMAL HIGH (ref 150–400)
RBC: 3.11 MIL/uL — ABNORMAL LOW (ref 3.87–5.11)
RDW: 13.9 % (ref 11.5–15.5)
Smear Review: NORMAL
WBC: 47.2 10*3/uL — ABNORMAL HIGH (ref 4.0–10.5)
nRBC: 0 % (ref 0.0–0.2)

## 2022-06-28 LAB — TROPONIN I (HIGH SENSITIVITY)
Troponin I (High Sensitivity): 23 ng/L — ABNORMAL HIGH (ref <18)
Troponin I (High Sensitivity): 11 ng/L (ref <18)

## 2022-06-28 LAB — GLUCOSE, CAPILLARY: Glucose-Capillary: 164 mg/dL — ABNORMAL HIGH (ref 70–99)

## 2022-06-28 LAB — COMPREHENSIVE METABOLIC PANEL
ALT: 27 U/L (ref 0–44)
AST: 48 U/L — ABNORMAL HIGH (ref 15–41)
Albumin: 2.2 g/dL — ABNORMAL LOW (ref 3.5–5.0)
Alkaline Phosphatase: 223 U/L — ABNORMAL HIGH (ref 38–126)
Anion gap: 12 (ref 5–15)
BUN: 20 mg/dL (ref 8–23)
CO2: 24 mmol/L (ref 22–32)
Calcium: 8.4 mg/dL — ABNORMAL LOW (ref 8.9–10.3)
Chloride: 93 mmol/L — ABNORMAL LOW (ref 98–111)
Creatinine, Ser: 1.02 mg/dL — ABNORMAL HIGH (ref 0.44–1.00)
GFR, Estimated: >60 mL/min (ref >60)
Glucose, Bld: 124 mg/dL — ABNORMAL HIGH (ref 70–99)
Potassium: 3.9 mmol/L (ref 3.5–5.1)
Sodium: 129 mmol/L — ABNORMAL LOW (ref 135–145)
Total Bilirubin: 2.8 mg/dL — ABNORMAL HIGH (ref 0.3–1.2)
Total Protein: 7.1 g/dL (ref 6.5–8.1)

## 2022-06-28 LAB — PROCALCITONIN: Procalcitonin: 1.12 ng/mL

## 2022-06-28 LAB — LACTIC ACID, PLASMA
Lactic Acid, Venous: 5.5 mmol/L (ref 0.5–1.9)
Lactic Acid, Venous: 4.1 mmol/L (ref 0.5–1.9)
Lactic Acid, Venous: 1.7 mmol/L (ref 0.5–1.9)

## 2022-06-28 LAB — MRSA NEXT GEN BY PCR, NASAL: MRSA by PCR Next Gen: NOT DETECTED

## 2022-06-28 LAB — PROTIME-INR
INR: 1.1 (ref 0.8–1.2)
Prothrombin Time: 14.1 seconds (ref 11.4–15.2)

## 2022-06-28 LAB — FIBRINOGEN: Fibrinogen: 617 mg/dL — ABNORMAL HIGH (ref 210–475)

## 2022-06-28 LAB — D-DIMER, QUANTITATIVE: D-Dimer, Quant: 6.4 ug/mL-FEU — ABNORMAL HIGH (ref 0.00–0.50)

## 2022-06-28 MED ORDER — ENOXAPARIN SODIUM 40 MG/0.4ML IJ SOSY
40.0000 mg | PREFILLED_SYRINGE | INTRAMUSCULAR | Status: DC
  Administered 2022-06-28 – 2022-07-05 (×8): 40 mg via SUBCUTANEOUS
  Filled 2022-06-28 (×8): qty 0.4

## 2022-06-28 MED ORDER — DULOXETINE HCL 30 MG PO CPEP
30.0000 mg | ORAL_CAPSULE | Freq: Every day | ORAL | Status: DC
  Administered 2022-06-29 – 2022-07-06 (×8): 30 mg via ORAL
  Filled 2022-06-28 (×9): qty 1

## 2022-06-28 MED ORDER — DOCUSATE SODIUM 100 MG PO CAPS: 100.0000 mg | ORAL_CAPSULE | Freq: Two times a day (BID) | ORAL | Status: DC | PRN

## 2022-06-28 MED ORDER — POLYETHYLENE GLYCOL 3350 17 G PO PACK
17.0000 g | PACK | Freq: Every day | ORAL | Status: DC | PRN
  Filled 2022-06-28: qty 1

## 2022-06-28 MED ORDER — SODIUM CHLORIDE 0.9 % IV SOLN
500.0000 mg | INTRAVENOUS | Status: DC
  Administered 2022-06-28 – 2022-07-01 (×4): 500 mg via INTRAVENOUS
  Filled 2022-06-28 (×2): qty 500
  Filled 2022-06-28 (×2): qty 5
  Filled 2022-06-28: qty 500

## 2022-06-28 MED ORDER — NOREPINEPHRINE 4 MG/250ML-% IV SOLN
0.0000 ug/min | INTRAVENOUS | Status: DC
  Administered 2022-06-28: 5 ug/min via INTRAVENOUS
  Filled 2022-06-28: qty 250

## 2022-06-28 MED ORDER — SODIUM CHLORIDE 0.9 % IV SOLN: INTRAVENOUS | Status: DC | PRN

## 2022-06-28 MED ORDER — ALBUTEROL SULFATE (2.5 MG/3ML) 0.083% IN NEBU
2.5000 mg | INHALATION_SOLUTION | Freq: Four times a day (QID) | RESPIRATORY_TRACT | Status: DC | PRN
  Administered 2022-07-01: 2.5 mg via RESPIRATORY_TRACT
  Filled 2022-06-28: qty 3

## 2022-06-28 MED ORDER — SODIUM CHLORIDE 0.9 % IV BOLUS
500.0000 mL | Freq: Once | INTRAVENOUS | Status: AC
  Administered 2022-06-28: 500 mL via INTRAVENOUS

## 2022-06-28 MED ORDER — PANTOPRAZOLE SODIUM 40 MG IV SOLR
40.0000 mg | Freq: Two times a day (BID) | INTRAVENOUS | Status: DC
  Administered 2022-06-28 – 2022-07-06 (×16): 40 mg via INTRAVENOUS
  Filled 2022-06-28 (×16): qty 10

## 2022-06-28 MED ORDER — VANCOMYCIN HCL 1250 MG/250ML IV SOLN
1250.0000 mg | Freq: Once | INTRAVENOUS | Status: AC
  Administered 2022-06-28: 1250 mg via INTRAVENOUS
  Filled 2022-06-28: qty 250

## 2022-06-28 MED ORDER — IPRATROPIUM-ALBUTEROL 0.5-2.5 (3) MG/3ML IN SOLN
6.0000 mL | Freq: Once | RESPIRATORY_TRACT | Status: AC
  Administered 2022-06-28: 6 mL via RESPIRATORY_TRACT
  Filled 2022-06-28: qty 3

## 2022-06-28 MED ORDER — HYDROCORTISONE SOD SUC (PF) 100 MG IJ SOLR
50.0000 mg | Freq: Four times a day (QID) | INTRAMUSCULAR | Status: DC
  Administered 2022-06-28 – 2022-06-30 (×7): 50 mg via INTRAVENOUS
  Filled 2022-06-28 (×6): qty 2
  Filled 2022-06-28: qty 1
  Filled 2022-06-28: qty 2

## 2022-06-28 MED ORDER — IOHEXOL 350 MG/ML SOLN
75.0000 mL | Freq: Once | INTRAVENOUS | Status: AC | PRN
  Administered 2022-06-28: 75 mL via INTRAVENOUS

## 2022-06-28 MED ORDER — SODIUM CHLORIDE 0.9 % IV BOLUS
1500.0000 mL | Freq: Once | INTRAVENOUS | Status: AC
  Administered 2022-06-28: 1500 mL via INTRAVENOUS

## 2022-06-28 MED ORDER — IPRATROPIUM-ALBUTEROL 0.5-2.5 (3) MG/3ML IN SOLN
3.0000 mL | Freq: Four times a day (QID) | RESPIRATORY_TRACT | Status: DC
  Administered 2022-06-28 – 2022-06-29 (×5): 3 mL via RESPIRATORY_TRACT
  Filled 2022-06-28 (×6): qty 3

## 2022-06-28 MED ORDER — GABAPENTIN 100 MG PO CAPS
100.0000 mg | ORAL_CAPSULE | Freq: Two times a day (BID) | ORAL | Status: DC
  Administered 2022-06-28 – 2022-07-01 (×6): 100 mg via ORAL
  Filled 2022-06-28 (×6): qty 1

## 2022-06-28 MED ORDER — SODIUM CHLORIDE 0.9 % IV SOLN
2.0000 g | Freq: Once | INTRAVENOUS | Status: AC
  Administered 2022-06-28: 2 g via INTRAVENOUS
  Filled 2022-06-28: qty 12.5

## 2022-06-28 MED ORDER — METRONIDAZOLE 500 MG/100ML IV SOLN
500.0000 mg | Freq: Three times a day (TID) | INTRAVENOUS | Status: AC
  Administered 2022-06-28 – 2022-07-02 (×12): 500 mg via INTRAVENOUS
  Filled 2022-06-28 (×15): qty 100

## 2022-06-28 MED ORDER — ONDANSETRON HCL 4 MG/2ML IJ SOLN
4.0000 mg | Freq: Once | INTRAMUSCULAR | Status: AC
  Administered 2022-06-28: 4 mg via INTRAVENOUS
  Filled 2022-06-28: qty 2

## 2022-06-28 MED ORDER — SODIUM CHLORIDE 0.9 % IV SOLN
2.0000 g | Freq: Two times a day (BID) | INTRAVENOUS | Status: AC
  Administered 2022-06-29 – 2022-07-02 (×9): 2 g via INTRAVENOUS
  Filled 2022-06-28: qty 2
  Filled 2022-06-28: qty 12.5
  Filled 2022-06-28 (×6): qty 2
  Filled 2022-06-28 (×3): qty 12.5

## 2022-06-28 MED ORDER — METRONIDAZOLE 500 MG/100ML IV SOLN: 500.0000 mg | Freq: Once | INTRAVENOUS | Status: DC

## 2022-06-28 MED ORDER — PANTOPRAZOLE 80MG IVPB - SIMPLE MED
80.0000 mg | Freq: Once | INTRAVENOUS | Status: DC
  Administered 2022-06-28: 80 mg via INTRAVENOUS
  Filled 2022-06-28: qty 100

## 2022-06-28 MED ORDER — METHYLPREDNISOLONE SODIUM SUCC 125 MG IJ SOLR
125.0000 mg | Freq: Once | INTRAMUSCULAR | Status: AC
  Administered 2022-06-28: 125 mg via INTRAVENOUS
  Filled 2022-06-28: qty 2

## 2022-06-28 MED ORDER — NOREPINEPHRINE 4 MG/250ML-% IV SOLN
2.0000 ug/min | INTRAVENOUS | Status: DC
  Administered 2022-06-28: 6 ug/min via INTRAVENOUS
  Filled 2022-06-28: qty 250

## 2022-06-28 NOTE — Consult Note (Signed)
Pharmacy Antibiotic Note  Regina Bernard is a 66 y.o. female admitted on 06/28/2022 with pneumonia.  Pharmacy has been consulted for Cefepime dosing.  Plan: Cefepime 2g,m IVPB q 12 hrs  Height: 5' 3.5" (161.3 cm) Weight: 56.2 kg (124 lb) IBW/kg (Calculated) : 53.55  Temp (24hrs), Avg:97.4 F (36.3 C), Min:97.4 F (36.3 C), Max:97.4 F (36.3 C)  Recent Labs  Lab 06/28/22 1242  WBC 47.2*  CREATININE 1.02*  LATICACIDVEN 5.5*    Estimated Creatinine Clearance: 46.5 mL/min (A) (by C-G formula based on SCr of 1.02 mg/dL (H)).    Allergies  Allergen Reactions   Penicillins Swelling and Rash    Has patient had a PCN reaction causing immediate rash, facial/tongue/throat swelling, SOB or lightheadedness with hypotension: Yes Has patient had a PCN reaction causing severe rash involving mucus membranes or skin necrosis: No Has patient had a PCN reaction that required hospitalization: No Has patient had a PCN reaction occurring within the last 10 years: No If all of the above answers are "NO", then may proceed with Cephalosporin use.     Antimicrobials this admission: Vancomycin 12/22 x 1 dose  Cefepime  12/22 >>  Metronidazole 12/22  Dose adjustments this admission: n/a  Microbiology results: 12/22 BCx: pending   Thank you for allowing pharmacy to be a part of this patient's care.  Finian Helvey Rodriguez-Guzman PharmD, BCPS 06/28/2022 3:54 PM

## 2022-06-28 NOTE — ED Triage Notes (Signed)
Pt presents to the ED via POV due to weakness and fever at home. Pt states she has not been feeling well x2 weeks. Pt has had a cough with red tinge sputum. Pt wears 3L O2 at baseline. Pt is A&O x4.

## 2022-06-28 NOTE — ED Provider Triage Note (Signed)
Emergency Medicine Provider Triage Evaluation Note  ALYSAH CARTON, a 66 y.o. female  was evaluated in triage.  Pt complains of fever, hematemesis, and weakness.  Patient with a history of COPD, bleeding, breast cancer, hypertension, cirrhosis, presents to the ED for evaluation of generalized weakness intermittent fevers but patient's been treated with 2 separate antibiotics for persistent cough in the same timeframe.  She presents to the ED endorsing hemoptysis, cough, and generalized weakness.  Patient is normally on 2 L chronically now, is up to 3 L via nasal cannula.  Review of Systems  Positive: Fevers, hemoptysis, SOB Negative: NVD  Physical Exam  There were no vitals taken for this visit. Gen:   Awake, no distress. Appears pale and frail. Resp:  Normal effort rhonchi noted bilaterally MSK:   Moves extremities without difficulty  Other:    Medical Decision Making  Medically screening exam initiated at 11:57 AM.  Appropriate orders placed.  TAE ROBAK was informed that the remainder of the evaluation will be completed by another provider, this initial triage assessment does not replace that evaluation, and the importance of remaining in the ED until their evaluation is complete.  Geriatric patient to the ED for evaluation generalized weakness, hemoptysis, and shortness of breath.   Melvenia Needles, PA-C 06/28/22 1200

## 2022-06-28 NOTE — Consult Note (Signed)
PHARMACY -  BRIEF ANTIBIOTIC NOTE   Pharmacy has received consult(s) for Vancomycin from an ED provider.  The patient's profile has been reviewed for ht/wt/allergies/indication/available labs.    One time order(s) placed for Vancomycin '1500mg'$  IVPB x 1 dose  Further antibiotics/pharmacy consults should be ordered by admitting physician if indicated.                       Thank you, Caidance Sybert Rodriguez-Guzman PharmD, BCPS 06/28/2022 1:28 PM

## 2022-06-28 NOTE — Consult Note (Signed)
PHARMACY CONSULT NOTE - FOLLOW UP  Pharmacy Consult for Electrolyte Monitoring and Replacement   Recent Labs: Potassium (mmol/L)  Date Value  06/28/2022 3.9  07/04/2013 3.5   Magnesium (mg/dL)  Date Value  11/01/2021 1.6 (L)  10/20/2012 1.9   Calcium (mg/dL)  Date Value  06/28/2022 8.4 (L)   Calcium, Total (mg/dL)  Date Value  07/04/2013 8.8   Albumin (g/dL)  Date Value  06/28/2022 2.2 (L)  10/12/2014 4.1   Sodium (mmol/L)  Date Value  06/28/2022 129 (L)  07/04/2013 133 (L)    Assessment: Patient with hx of smoker, severe COPD on chronic O2 3 L nasal cannula, remote breast cancer, history of gastric ulcer and upper GI bleed, liver cirrhosis.   Presents to ED with shortness of breath and failure to thrive. Patient admitted to critical care and pharmacy consulted to manage electrolytes.  Goal of Therapy:  Electrolytes - WNL  Plan:  Na below goal - NS bolus given , will defer further order to MD K within normal limits, and no other electrolytes to assess Will follow up AM results and replace as needed.   Loyd Marhefka Rodriguez-Guzman PharmD, BCPS 06/28/2022 3:43 PM

## 2022-06-28 NOTE — Plan of Care (Signed)

## 2022-06-28 NOTE — Progress Notes (Signed)
NAME:  Regina Bernard, MRN:  [id number], DOB:  08/22/1955, LOS: 0 ADMISSION DATE:  06/28/2022, CHIEF COMPLAINT:  shortness of breath   History of Present Illness:   Regina Bernard is a pleasant 66 year old female with a history of COPD and liver cirrhosis (History of EtOH use and HCV infection) who is presenting to the hospital with shortness of breath and failure to thrive.  She reports her symptoms started around 2 weeks ago with increased fatigue, lethargy, and shortness of breath. She also developed a cough that was productive of greenish sputum. She also reports fevers for the past week up to 102 as well as chills. She has been feeling increasingly short of breath and that has lead to some chest discomfort. She does not have any abdominal pain or increased abdominal swelling. Her lower extremities are not swollen and there has been no redness. She doesn't think that she was exposed to any illness. Her husband had been caring for her over the past two weeks. She tells me that she'd been sleeping more secondary to her illness. Denies any aspiration. There was confusion about hematemesis but she denied, no other GI symptoms reported.  She follows with Dr. Raul Del at [facility] pulmonology and is maintained on inhalers and recently prescribed three courses of azithromycin and steroids for a COPD exacerbation (12/11, 12/15, 12/19). She has a long standing smoking history (at least 50 pack years) as well as previous history of drug and alcohol use.  In the ED, she was noted to be hypotensive with significant leukocytosis. She was also hypoxic and had increased work of breathing, prompting initiation of BiPAP, with improvement in respiratory status. She was also started on broad spectrum antibiotics and a CT scan of the chest was performed. She was started on nor-epinephrine peripherally and admitted to the ICU for further care.  Pertinent  Medical History  COPD on 2 L with ambulation Liver  Cirrhosis History of PUD (H. Pylori, UGIB) Right Breast CA (Stage II) s/p chemo, radiation and resection Anxiety HTN  Significant Hospital Events: Including procedures, antibiotic start and stop dates in addition to other pertinent events   06/28/2022: admit to ICU. Started on bipap and nor-epi in the ED   Objective   Blood pressure (!) 85/59, pulse 93, temperature (!) 97.4 F (36.3 C), temperature source Axillary, resp. rate (!) 24, height 5' 3.5" (1.613 m), weight 56.2 kg, SpO2 98 %.    FiO2 (%):  [40 %] 40 %   Intake/Output Summary (Last 24 hours) at 06/28/2022 1516 Last data filed at 06/28/2022 1417 Gross per 24 hour  Intake 100 ml  Output --  Net 100 ml   Filed Weights   06/28/22 1303  Weight: 56.2 kg    Examination: Physical Exam Constitutional:      General: She is not in acute distress.    Appearance: She is ill-appearing.  HENT:     Nose: Nose normal.     Mouth/Throat:     Mouth: Mucous membranes are moist.  Eyes:     Pupils: Pupils are equal, round, and reactive to light.  Cardiovascular:     Rate and Rhythm: Regular rhythm. Tachycardia present.     Pulses: Normal pulses.     Heart sounds: Normal heart sounds.  Pulmonary:     Effort: Respiratory distress present.     Breath sounds: Wheezing and rales present.  Abdominal:     General: There is distension.     Palpations: Abdomen is  soft.  Musculoskeletal:        General: Normal range of motion.  Skin:    General: Skin is warm.  Neurological:     General: No focal deficit present.     Mental Status: She is alert and oriented to person, place, and time.      Assessment & Plan:   66 year old female with a history of COPD and liver cirrhosis presents to the hospital with septic shock secondary to LUL pneumonia.  Neurology #Anxiety #Depression  no active issues at the moment. Patient reports history of anxiety, will restart home medications while adjusting to avoid over sedation  -continue  Duloxetine 30 mg daily -decrease gabapentin dose to 100 mg bid while acutely ill, increase as tolerated. -hold lamotrigine 100 mg daily  Cardiovascular #Septic Shock  In the setting of pneumonia, on nor-epinephrine for vasopressor support. Lactic acid elevated indicating compromised perfusion. Liver cirrhosis likely contributing to decreased LA clearance. Will trend.  -goal MAP 65 mmHg -trend lactic acid  Pulmonary #Cavitary Pneumonia #Acute Hypoxic and Hypercapnic Respiratory Failure #COPD Exacerbation  Hypoxic on presentation with blood gas showing respiratory acidosis (7.2/65) that improved with BiPAP. Has received multiple courses of azithromycin recently, with worsening of her condition. She also has a history of COPD maintained on long acting inhalers. She is now on nasal cannula and is alert and oriented. Will trend blood gases and continue to treat for pneumonia with broad spectrum antibiotics. Will start hydrocortisone 50 mg q 6 hours as adjunct in the management of CAP (per CAPE COD Trial).  -hydrocortisone 50 mg q 6 hours -nocturnal BiPAP and with worsening of respiratory status -infectious workup and management per ID section -standing duo-nebs  GI #Liver Cirrhosis #History of UGIB  Has a history of PUD and H. Pylori with previous GI bleed. Hemoglobin is stable, but will continue on BID PPI for the time being. While the patient denies symptoms, history is conflicting. Has had HCV and EtOH use with nodular liver on CT concerning for cirrhosis. No ascites or other findings to suggest decompensation.  -PPI BID -NPO while high risk for BiPAP -daily MELD labs  Renal #AKI #Hyponatremia  Mild AKI with a creatinine of 1.02 in addition to mild hyponatremia. Given history of liver disease she is at high risk for hepato-renal syndrome. She was resuscitated per sepsis guidelines. Will closely monitor urine output and kidney function.  -avoid nephrotoxic medications -urine  sodium and creatinine for FENA -bid BMP  Hem/Onc Lovenox for prophylaxis. BID CBC to rule out active upper GI bleed.  -DIC workup  Endo  Hydrocortisone as adjunct glucocorticoids for management of severe pneumonia. Will monitor glucose closely.  ID #Cavitary Pneumonia #Septic Shock  Cavitary and lobar pneumonia of the LUL on CT with findings of septic shock and leukocytosis. Will initiate broad spectrum antibiotics pending cultures (Cefepime, Vancomycin, Azithromycin, and Flagyl). Will rule out TB, obtain respiratory cultures, send fungal workup (immune suppressed given cirrhosis, BDG/aspergillus/crypto). Will also obtain HCV antibodies and viral load given this was not re-tested after treatment. Should her respiratory function worsen and she be intubated would benefit from bronch for BAL.  -broad spectrum antibiotics -respiratory cultures -rule out TB -fungal workup  Best Practice (right click and "Reselect all SmartList Selections" daily)   Diet/type: NPO DVT prophylaxis: LMWH GI prophylaxis: PPI Lines: N/A Foley:  N/A Code Status:  full code Last date of multidisciplinary goals of care discussion [06/28/2022]  Labs   CBC: Recent Labs  Lab 06/28/22 1242  WBC 47.2*  NEUTROABS 43.2*  HGB 9.9*  HCT 30.3*  MCV 97.4  PLT 520*    Basic Metabolic Panel: Recent Labs  Lab 06/28/22 1242  NA 129*  K 3.9  CL 93*  CO2 24  GLUCOSE 124*  BUN 20  CREATININE 1.02*  CALCIUM 8.4*   GFR: Estimated Creatinine Clearance: 46.5 mL/min (A) (by C-G formula based on SCr of 1.02 mg/dL (H)). Recent Labs  Lab 06/28/22 1242  WBC 47.2*  LATICACIDVEN 5.5*    Liver Function Tests: Recent Labs  Lab 06/28/22 1242  AST 48*  ALT 27  ALKPHOS 223*  BILITOT 2.8*  PROT 7.1  ALBUMIN 2.2*   No results for input(s): "LIPASE", "AMYLASE" in the last 168 hours. No results for input(s): "AMMONIA" in the last 168 hours.  ABG    Component Value Date/Time   PHART 7.37 01/14/2018  2358   PCO2ART 46 01/14/2018 2358   PO2ART 67 (L) 01/14/2018 2358   HCO3 25.4 06/28/2022 1238   ACIDBASEDEF 3.9 (H) 06/28/2022 1238   O2SAT PENDING 06/28/2022 1238     Coagulation Profile: Recent Labs  Lab 06/28/22 1242  INR 1.1    Cardiac Enzymes: No results for input(s): "CKTOTAL", "CKMB", "CKMBINDEX", "TROPONINI" in the last 168 hours.  HbA1C: Hgb A1c MFr Bld  Date/Time Value Ref Range Status  01/15/2018 03:59 AM 5.4 4.8 - 5.6 % Final    Comment:    (NOTE) Pre diabetes:          5.7%-6.4% Diabetes:              >6.4% Glycemic control for   <7.0% adults with diabetes     CBG: No results for input(s): "GLUCAP" in the last 168 hours.  Review of Systems:   Review of Systems  Constitutional:  Positive for chills, fever and malaise/fatigue.  Respiratory:  Positive for cough, sputum production, shortness of breath and wheezing.   Cardiovascular:  Positive for chest pain. Negative for leg swelling.  Skin:  Negative for rash.     Past Medical History:  She,  has a past medical history of Abnormal CT scan, chest, Anxiety, Asthma, Breast cancer ([place]), Breast cancer ([place]) (06/18/2011), COPD (chronic obstructive pulmonary disease) ([place]), Endometriosis, Fatty liver, Hepatitis C (1998), History of substance abuse ([place]), Hot flashes related to aromatase inhibitor therapy, Hypertension, Lung mass, and Peripheral neuropathy.   Surgical History:   Past Surgical History:  Procedure Laterality Date   BREAST LUMPECTOMY Right 2013   COLONOSCOPY WITH PROPOFOL N/A 03/20/2022   Procedure: COLONOSCOPY WITH PROPOFOL;  Surgeon: [name], MD;  Location: [facility] ENDOSCOPY;  Service: Gastroenterology;  Laterality: N/A;   ESOPHAGOGASTRODUODENOSCOPY (EGD) WITH PROPOFOL N/A 10/29/2021   Procedure: ESOPHAGOGASTRODUODENOSCOPY (EGD) WITH PROPOFOL;  Surgeon: [name], MD;  Location[facility] ENDOSCOPY;  Service: Gastroenterology;  Laterality: N/A;   ESOPHAGOGASTRODUODENOSCOPY (EGD)  WITH PROPOFOL N/A 02/14/2022   Procedure: ESOPHAGOGASTRODUODENOSCOPY (EGD) WITH PROPOFOL;  Surgeon: [name], MD;  Location: [facility] ENDOSCOPY;  Service: Gastroenterology;  Laterality: N/A;  DRIVER 5 - [place]   Upper right leg benign tumor removed     VAGINAL HYSTERECTOMY  2001     Social History:   reports that she quit smoking about 21 months ago. Her smoking use included e-cigarettes. She has never used smokeless tobacco. She reports current alcohol use. She reports that she does not use drugs.   Family History:  Her family history includes Diabetes in her maternal  grandmother; Heart disease in her mother; Hypertension in her mother; Lung cancer in her father and mother; Skin cancer in her mother. There is no history of Breast cancer.   Allergies Allergies  Allergen Reactions   Penicillins Swelling and Rash    Has patient had a PCN reaction causing immediate rash, facial/tongue/throat swelling, SOB or lightheadedness with hypotension: Yes Has patient had a PCN reaction causing severe rash involving mucus membranes or skin necrosis: No Has patient had a PCN reaction that required hospitalization: No Has patient had a PCN reaction occurring within the last 10 years: No If all of the above answers are "NO", then may proceed with Cephalosporin use.      Home Medications  Prior to Admission medications   Medication Sig Start Date End Date Taking? Authorizing Provider  albuterol (PROVENTIL) (2.5 MG/3ML) 0.083% nebulizer solution Take 2.5 mg by nebulization every 6 (six) hours as needed for wheezing.  09/16/14   [provider]  diazepam (VALIUM) 2 MG tablet SMARTSIG:1 Tablet(s) By Mouth 10/25/21   [provider]  DULoxetine (CYMBALTA) 30 MG capsule Take 1 capsule (30 mg total) by mouth 2 (two) times daily. 09/19/17   Cammie Sickle, MD  gabapentin (NEURONTIN) 300 MG capsule Take 1 capsule (300 mg total) by mouth 3 (three) times  daily. 10/25/20   Cammie Sickle, MD  HYDROcodone-acetaminophen (NORCO/VICODIN) 5-325 MG tablet Take 1 tablet by mouth 2 (two) times daily as needed. Patient not taking: Reported on 03/20/2022 02/06/22   [provider]  lamoTRIgine (LAMICTAL) 100 MG tablet Take by mouth. 11/07/21   [provider]  metoprolol succinate (TOPROL-XL) 100 MG 24 hr tablet Take 100 mg by mouth daily. Take with or immediately following a meal.    [provider]  Multiple Vitamin (MULTI-VITAMINS) TABS Take 1 tablet by mouth daily.     [provider]  nicotine (NICODERM CQ - DOSED IN MG/24 HOURS) 21 mg/24hr patch Place 1 patch (21 mg total) onto the skin daily. Patient not taking: Reported on 02/14/2022 11/01/21   Enzo Bi, MD  omeprazole (PRILOSEC) 20 MG capsule Take 40 mg by mouth every morning. 02/14/22   [provider]  OXYGEN Inhale 2 L into the lungs at bedtime and may repeat dose one time if needed.     [provider]  QUEtiapine (SEROQUEL) 25 MG tablet Take by mouth. 08/24/21   [provider]  SENEXON-S 8.6-50 MG tablet Take 2 tablets by mouth 2 (two) times daily. 12/18/21   [provider]  TRELEGY ELLIPTA 100-62.5-25 MCG/INH AEPB Inhale 1 puff into the lungs daily. 11/06/18   [provider]     Critical care time: 50 minutes    Armando Reichert, MD [facility] 06/28/2022 4:15 PM

## 2022-06-28 NOTE — ED Provider Notes (Addendum)
[facility] Provider Note    Event Date/Time   First MD Initiated Contact with Patient 06/28/22 1217     (approximate)   History   Weakness   HPI  Regina Bernard is a 66 y.o. female   Past medical history of number smoker, severe COPD on chronic O2 3 L nasal cannula, remote breast cancer, history of gastric ulcer and upper GI bleed, liver cirrhosis, presents to the emergency department with 2 weeks of shortness of breath, productive cough, subjective fevers.  She had finished a course of azithromycin with no relief of symptoms.  She has blood-tinged sputum.    Initially documented in triage hematemesis but patient denies vomiting and has no GI bleeding reported.    She has some soreness to her chest and her abdomen from all the coughing.    History was obtained via the patient. Husband at bedside who acts as independent historian to give collateral information I reviewed external medical notes including upper endoscopy performed on 02/14/2022 which showed no varices.      Physical Exam   Triage Vital Signs: ED Triage Vitals  Enc Vitals Group     BP 06/28/22 1206 (!) 84/58     Pulse Rate 06/28/22 1206 93     Resp 06/28/22 1206 18     Temp 06/28/22 1206 (!) 97.4 F (36.3 C)     Temp Source 06/28/22 1206 Axillary     SpO2 06/28/22 1206 100 %     Weight --      Height --      Head Circumference --      Peak Flow --      Pain Score 06/28/22 1214 7     Pain Loc --      Pain Edu? --      Excl. in [place]? --     Most recent vital signs: Vitals:   06/28/22 1206  BP: (!) 84/58  Pulse: 93  Resp: 18  Temp: (!) 97.4 F (36.3 C)  SpO2: 100%    General: Awake, no distress.  CV:  Poor skin Turgor and dry mucous membranes looks clinically dehydrated. Resp:  Productive cough, diffuse wheezing, coarse lung sounds, speaking in short phrases. Abd:  No distention.  Nontender to palpation. Other:  She is hypotensive 80s over 50s and afebrile, she has  an oxygen saturation of 92% on her home 3 L nasal cannula.   ED Results / Procedures / Treatments   Labs (all labs ordered are listed, but only abnormal results are displayed) Labs Reviewed  COMPREHENSIVE METABOLIC PANEL - Abnormal; Notable for the following components:      Result Value   Sodium 129 (*)    Chloride 93 (*)    Glucose, Bld 124 (*)    Creatinine, Ser 1.02 (*)    Calcium 8.4 (*)    Albumin 2.2 (*)    AST 48 (*)    Alkaline Phosphatase 223 (*)    Total Bilirubin 2.8 (*)    All other components within normal limits  LACTIC ACID, PLASMA - Abnormal; Notable for the following components:   Lactic Acid, Venous 5.5 (*)    All other components within normal limits  CBC WITH DIFFERENTIAL/PLATELET - Abnormal; Notable for the following components:   WBC 47.2 (*)    RBC 3.11 (*)    Hemoglobin 9.9 (*)    HCT 30.3 (*)    Platelets 520 (*)    All other components within normal  limits  BLOOD GAS, VENOUS - Abnormal; Notable for the following components:   pH, Ven 7.2 (*)    pCO2, Ven 65 (*)    Acid-base deficit 3.9 (*)    All other components within normal limits  TROPONIN I (HIGH SENSITIVITY) - Abnormal; Notable for the following components:   Troponin I (High Sensitivity) 23 (*)    All other components within normal limits  CULTURE, BLOOD (ROUTINE X 2)  CULTURE, BLOOD (ROUTINE X 2)  PROTIME-INR  LACTIC ACID, PLASMA  URINALYSIS, ROUTINE W REFLEX MICROSCOPIC     I reviewed labs and they are notable for blood cell count is 47.  Her pH is 7.2 and she is hypercapnic at 65.  EKG  ED ECG REPORT I, Lucillie Garfinkel, the attending physician, personally viewed and interpreted this ECG.   Date: 06/28/2022  EKG Time: 1158  Rate: 93  Rhythm: normal sinus rhythm  Axis: nl  Intervals:none  ST&T Change: No acute ischemic changes    RADIOLOGY I independently reviewed and interpreted large opacity obscuring much of the left lung airspace   PROCEDURES:  Critical Care  performed: Yes, see critical care procedure note(s)  .Critical Care  Performed by: Lucillie Garfinkel, MD Authorized by: Lucillie Garfinkel, MD   Critical care provider statement:    Critical care time (minutes):  30   Critical care was necessary to treat or prevent imminent or life-threatening deterioration of the following conditions:  Sepsis   Critical care was time spent personally by me on the following activities:  Development of treatment plan with patient or surrogate, discussions with consultants, evaluation of patient's response to treatment, examination of patient, ordering and review of laboratory studies, ordering and review of radiographic studies, ordering and performing treatments and interventions, pulse oximetry, re-evaluation of patient's condition and review of old [place] ED: Medications  ceFEPIme (MAXIPIME) 2 g in sodium chloride 0.9 % 100 mL IVPB (2 g Intravenous New Bag/Given 06/28/22 1313)  pantoprazole (PROTONIX) 80 mg /NS 100 mL IVPB (has no administration in time range)  vancomycin (VANCOREADY) IVPB 1250 mg/250 mL (has no administration in time range)  sodium chloride 0.9 % bolus 1,500 mL (1,500 mLs Intravenous New Bag/Given 06/28/22 1305)  ondansetron (ZOFRAN) injection 4 mg (4 mg Intravenous Given 06/28/22 1250)  ipratropium-albuterol (DUONEB) 0.5-2.5 (3) MG/3ML nebulizer solution 6 mL (6 mLs Nebulization Given 06/28/22 1250)  methylPREDNISolone sodium succinate (SOLU-MEDROL) 125 mg/2 mL injection 125 mg (125 mg Intravenous Given 06/28/22 1250)    Consultants:  I spoke with hospitalist for admission and regarding care plan for this patient.   IMPRESSION / MDM / ASSESSMENT AND PLAN / ED COURSE  I reviewed the triage vital signs and the nursing notes.                              Differential diagnosis includes, but is not limited to, lung infection, URI viral, sepsis, ACS, COPD exacerbation, ACS or PE   The patient is on the cardiac monitor  to evaluate for evidence of arrhythmia and/or significant heart rate changes.  MDM: With respiratory infectious symptoms and COPD exacerbation who is hypotensive, will initiate sepsis workup including 30 cc/kg ideal body weight crystalloid bolus in addition to broad-spectrum antibiotics.  COPD treatment including DuoNebs and steroids.  PH is 7.2 and she is hypercapnic, will initiate BiPAP.  She has a large airspace opacity obscuring the left lung fields, will better  evaluate with CT scan.  She has a markedly elevated white blood cell count at 47 and even in the setting of receiving her recent steroid Dosepak that ended approximately 1 week ago, this markedly elevated count is concerning for underlying malignancy.  ---- SEPSIS reevaluation: blood pressure markedly improved 110 over 70s now, subjective breathing better on BiPAP.  Admit to the hospitalist service, CT scan of the chest is ordered to better characterize this mass on the left lung that was there previously thought to be postinfectious changes. ---  Refraactory  hypotension started on pressors and ICU admission.  Patient's presentation is most consistent with acute presentation with potential threat to life or bodily function.       FINAL CLINICAL IMPRESSION(S) / ED DIAGNOSES   Final diagnoses:  Generalized weakness  COPD with acute exacerbation (HCC)  Pneumonia of left lung due to infectious organism, unspecified part of lung  Sepsis, due to unspecified organism, unspecified whether acute organ dysfunction present [facility])     Rx / DC Orders   ED Discharge Orders     None        Note:  This document was prepared using Dragon voice recognition software and may include unintentional dictation errors.    Lucillie Garfinkel, MD 06/28/22 1332    Lucillie Garfinkel, MD 06/28/22 1333    Lucillie Garfinkel, MD 06/28/22 [phone]

## 2022-06-28 NOTE — Progress Notes (Signed)
Pt stated that she wanted to come off bipap and stated that her breathing was better. Placed on 4lpm [place], sats 98%. Transported pt to CT with bipap on standby. No respiratory distress noted during transport.

## 2022-06-29 DIAGNOSIS — A419 Sepsis, unspecified organism: Secondary | ICD-10-CM | POA: Diagnosis not present

## 2022-06-29 DIAGNOSIS — J690 Pneumonitis due to inhalation of food and vomit: Secondary | ICD-10-CM | POA: Diagnosis not present

## 2022-06-29 DIAGNOSIS — J9601 Acute respiratory failure with hypoxia: Secondary | ICD-10-CM | POA: Diagnosis not present

## 2022-06-29 DIAGNOSIS — N179 Acute kidney failure, unspecified: Secondary | ICD-10-CM | POA: Diagnosis not present

## 2022-06-29 LAB — EXPECTORATED SPUTUM ASSESSMENT W GRAM STAIN, RFLX TO RESP C

## 2022-06-29 LAB — SODIUM, URINE, RANDOM: Sodium, Ur: <10 mmol/L

## 2022-06-29 LAB — URINALYSIS, ROUTINE W REFLEX MICROSCOPIC
Bilirubin Urine: NEGATIVE
Glucose, UA: NEGATIVE mg/dL
Hgb urine dipstick: NEGATIVE
Ketones, ur: NEGATIVE mg/dL
Leukocytes,Ua: NEGATIVE
Nitrite: NEGATIVE
Protein, ur: NEGATIVE mg/dL
Specific Gravity, Urine: 1.03 (ref 1.005–1.030)
pH: 5 (ref 5.0–8.0)

## 2022-06-29 LAB — CBC WITH DIFFERENTIAL/PLATELET
Abs Immature Granulocytes: 0.31 10*3/uL — ABNORMAL HIGH (ref 0.00–0.07)
Basophils Absolute: 0.1 10*3/uL (ref 0.0–0.1)
Basophils Relative: 0 %
Eosinophils Absolute: 0.1 10*3/uL (ref 0.0–0.5)
Eosinophils Relative: 0 %
HCT: 24.1 % — ABNORMAL LOW (ref 36.0–46.0)
Hemoglobin: 8 g/dL — ABNORMAL LOW (ref 12.0–15.0)
Immature Granulocytes: 1 %
Lymphocytes Relative: 1 %
Lymphs Abs: 0.3 10*3/uL — ABNORMAL LOW (ref 0.7–4.0)
MCH: 32.3 pg (ref 26.0–34.0)
MCHC: 33.2 g/dL (ref 30.0–36.0)
MCV: 97.2 fL (ref 80.0–100.0)
Monocytes Absolute: 0.6 10*3/uL (ref 0.1–1.0)
Monocytes Relative: 2 %
Neutro Abs: 28.4 10*3/uL — ABNORMAL HIGH (ref 1.7–7.7)
Neutrophils Relative %: 96 %
Platelets: 398 10*3/uL (ref 150–400)
RBC: 2.48 MIL/uL — ABNORMAL LOW (ref 3.87–5.11)
RDW: 14.2 % (ref 11.5–15.5)
Smear Review: NORMAL
WBC: 29.7 10*3/uL — ABNORMAL HIGH (ref 4.0–10.5)
nRBC: 0 % (ref 0.0–0.2)

## 2022-06-29 LAB — BASIC METABOLIC PANEL
Anion gap: 8 (ref 5–15)
BUN: 25 mg/dL — ABNORMAL HIGH (ref 8–23)
CO2: 25 mmol/L (ref 22–32)
Calcium: 7.9 mg/dL — ABNORMAL LOW (ref 8.9–10.3)
Chloride: 98 mmol/L (ref 98–111)
Creatinine, Ser: 1.05 mg/dL — ABNORMAL HIGH (ref 0.44–1.00)
GFR, Estimated: 59 mL/min — ABNORMAL LOW (ref >60)
Glucose, Bld: 149 mg/dL — ABNORMAL HIGH (ref 70–99)
Potassium: 4.2 mmol/L (ref 3.5–5.1)
Sodium: 131 mmol/L — ABNORMAL LOW (ref 135–145)

## 2022-06-29 LAB — STREP PNEUMONIAE URINARY ANTIGEN: Strep Pneumo Urinary Antigen: NEGATIVE

## 2022-06-29 LAB — CBC
HCT: 25.3 % — ABNORMAL LOW (ref 36.0–46.0)
Hemoglobin: 8.5 g/dL — ABNORMAL LOW (ref 12.0–15.0)
MCH: 31.8 pg (ref 26.0–34.0)
MCHC: 33.6 g/dL (ref 30.0–36.0)
MCV: 94.8 fL (ref 80.0–100.0)
Platelets: 393 10*3/uL (ref 150–400)
RBC: 2.67 MIL/uL — ABNORMAL LOW (ref 3.87–5.11)
RDW: 13.8 % (ref 11.5–15.5)
WBC: 27.8 10*3/uL — ABNORMAL HIGH (ref 4.0–10.5)
nRBC: 0 % (ref 0.0–0.2)

## 2022-06-29 LAB — GLUCOSE, CAPILLARY
Glucose-Capillary: 147 mg/dL — ABNORMAL HIGH (ref 70–99)
Glucose-Capillary: 286 mg/dL — ABNORMAL HIGH (ref 70–99)
Glucose-Capillary: 281 mg/dL — ABNORMAL HIGH (ref 70–99)
Glucose-Capillary: 193 mg/dL — ABNORMAL HIGH (ref 70–99)

## 2022-06-29 LAB — RESP PANEL BY RT-PCR (RSV, FLU A&B, COVID)  RVPGX2
Influenza A by PCR: NEGATIVE
Influenza B by PCR: NEGATIVE
Resp Syncytial Virus by PCR: NEGATIVE
SARS Coronavirus 2 by RT PCR: NEGATIVE

## 2022-06-29 LAB — MAGNESIUM: Magnesium: 1.4 mg/dL — ABNORMAL LOW (ref 1.7–2.4)

## 2022-06-29 LAB — CORTISOL: Cortisol, Plasma: >100 ug/dL

## 2022-06-29 LAB — CREATININE, URINE, RANDOM: Creatinine, Urine: 63 mg/dL

## 2022-06-29 LAB — PHOSPHORUS: Phosphorus: 6.2 mg/dL — ABNORMAL HIGH (ref 2.5–4.6)

## 2022-06-29 MED ORDER — ORAL CARE MOUTH RINSE: 15.0000 mL | OROMUCOSAL | Status: DC | PRN

## 2022-06-29 MED ORDER — MAGNESIUM SULFATE 4 GM/100ML IV SOLN
4.0000 g | Freq: Once | INTRAVENOUS | Status: AC
  Administered 2022-06-29: 4 g via INTRAVENOUS
  Filled 2022-06-29: qty 100

## 2022-06-29 MED ORDER — SODIUM CHLORIDE 3 % IN NEBU
4.0000 mL | INHALATION_SOLUTION | Freq: Two times a day (BID) | RESPIRATORY_TRACT | Status: DC
  Administered 2022-06-29 (×2): 4 mL via RESPIRATORY_TRACT
  Filled 2022-06-29 (×3): qty 4

## 2022-06-29 MED ORDER — INSULIN ASPART 100 UNIT/ML IJ SOLN
0.0000 [IU] | Freq: Three times a day (TID) | INTRAMUSCULAR | Status: DC
  Administered 2022-06-29: 3 [IU] via SUBCUTANEOUS
  Administered 2022-07-01 – 2022-07-04 (×3): 1 [IU] via SUBCUTANEOUS
  Administered 2022-07-05: 2 [IU] via SUBCUTANEOUS
  Filled 2022-06-29 (×5): qty 1

## 2022-06-29 MED ORDER — CHLORHEXIDINE GLUCONATE CLOTH 2 % EX PADS
6.0000 | MEDICATED_PAD | Freq: Every day | CUTANEOUS | Status: DC
  Administered 2022-06-29: 6 via TOPICAL

## 2022-06-29 NOTE — Progress Notes (Signed)
NAME:  Regina Bernard, MRN:  [id number], DOB:  07/10/1955, LOS: 1 ADMISSION DATE:  06/28/2022, CHIEF COMPLAINT:  shortness of breath   History of Present Illness:   Regina Bernard is a pleasant 66 year old female with a history of COPD and liver cirrhosis (History of EtOH use and HCV infection) who is presenting to the hospital with shortness of breath and failure to thrive.  She reports her symptoms started around 2 weeks ago with increased fatigue, lethargy, and shortness of breath. She also developed a cough that was productive of greenish sputum. She also reports fevers for the past week up to 102 as well as chills. She has been feeling increasingly short of breath and that has lead to some chest discomfort. She does not have any abdominal pain or increased abdominal swelling. Her lower extremities are not swollen and there has been no redness. She doesn't think that she was exposed to any illness. Her husband had been caring for her over the past two weeks. She tells me that she'd been sleeping more secondary to her illness. Denies any aspiration. There was confusion about hematemesis but she denied, no other GI symptoms reported.  She follows with Dr. Raul Del at [facility] pulmonology and is maintained on inhalers and recently prescribed three courses of azithromycin and steroids for a COPD exacerbation (12/11, 12/15, 12/19). She has a long standing smoking history (at least 50 pack years) as well as previous history of drug and alcohol use.  In the ED, she was noted to be hypotensive with significant leukocytosis. She was also hypoxic and had increased work of breathing, prompting initiation of BiPAP, with improvement in respiratory status. She was also started on broad spectrum antibiotics and a CT scan of the chest was performed. She was started on nor-epinephrine peripherally and admitted to the ICU for further care.  Pertinent  Medical History  COPD on 2 L with ambulation Liver  Cirrhosis History of PUD (H. Pylori, UGIB) Right Breast CA (Stage II) s/p chemo, radiation and resection Anxiety HTN  Significant Hospital Events: Including procedures, antibiotic start and stop dates in addition to other pertinent events   06/28/2022: admit to ICU. Started on bipap and nor-epi in the ED 06/29/2022: respiratory status is improved  Objective   Blood pressure 105/64, pulse (!) 59, temperature 97.9 F (36.6 C), temperature source Axillary, resp. rate 19, height 5' 3.5" (1.613 m), weight 57.4 kg, SpO2 99 %.    FiO2 (%):  [40 %] 40 %   Intake/Output Summary (Last 24 hours) at 06/29/2022 0835 Last data filed at 06/29/2022 0600 Gross per 24 hour  Intake 5539.13 ml  Output 400 ml  Net 5139.13 ml    Filed Weights   06/28/22 1303 06/28/22 1643 06/29/22 0500  Weight: 56.2 kg 58.3 kg 57.4 kg    Examination: Physical Exam Constitutional:      General: She is not in acute distress.    Appearance: She is ill-appearing.  HENT:     Nose: Nose normal.     Mouth/Throat:     Mouth: Mucous membranes are moist.  Eyes:     Pupils: Pupils are equal, round, and reactive to light.  Cardiovascular:     Rate and Rhythm: Regular rhythm. Tachycardia present.     Pulses: Normal pulses.     Heart sounds: Normal heart sounds.  Pulmonary:     Effort: No respiratory distress.     Breath sounds: Rales present. No wheezing.     Comments:  Decreased air entry bilaterally, improved from yesterday Abdominal:     General: There is no distension.     Palpations: Abdomen is soft.     Tenderness: There is no abdominal tenderness.  Musculoskeletal:        General: Normal range of motion.  Skin:    General: Skin is warm.  Neurological:     General: No focal deficit present.     Mental Status: She is alert and oriented to person, place, and time.      Assessment & Plan:   66 year old female with a history of COPD and liver cirrhosis presents to the hospital with septic shock  secondary to LUL pneumonia.  Neurology #Anxiety #Depression  no active issues at the moment. Patient reports history of anxiety, will restart home medications while adjusting to avoid over sedation  -continue Duloxetine 30 mg daily -decrease gabapentin dose to 100 mg bid while acutely ill, increase as tolerated. -hold lamotrigine 100 mg daily  Cardiovascular #Septic Shock  In the setting of pneumonia, initially on nor-epinephrine for vasopressor support. Lactic acid was elevated indicating compromised perfusion. Liver cirrhosis likely contributed to decreased LA clearance. Trended down and last value was normal at 1.7.  -goal MAP 65 mmHg, now off pressors  Pulmonary #Cavitary Pneumonia #Acute Hypoxic and Hypercapnic Respiratory Failure #COPD Exacerbation  Hypoxic on presentation with blood gas showing respiratory acidosis (7.2/65) that improved with BiPAP. Has received multiple courses of azithromycin recently, with worsening of her condition. She also has a history of COPD maintained on long acting inhalers. She is now on nasal cannula and is alert and oriented. continue to treat for pneumonia with broad spectrum antibiotics. Will start hydrocortisone 50 mg q 6 hours as adjunct in the management of CAP (per CAPE COD Trial). CXR daily, evaluate for any para pneumonic effusions with POCUS.   -hydrocortisone 50 mg q 6 hours -nocturnal BiPAP and with worsening of respiratory status -infectious workup and management per ID section -standing duo-nebs -goal SpO2 88 to 92%  GI #Liver Cirrhosis #History of UGIB  Has a history of PUD and H. Pylori with previous GI bleed. Hemoglobin is stable, but will continue on BID PPI for the time being. While the patient denies symptoms, history is conflicting. Has had HCV and EtOH use with nodular liver on CT concerning for cirrhosis. No ascites or other findings to suggest decompensation.  -PPI BID -daily MELD labs  MELD 3.0: 19 at 06/29/2022   4:50 AM MELD-Na: 18 at 06/29/2022  4:50 AM Calculated from: Serum Creatinine: 1.05 mg/dL at 06/29/2022  4:50 AM Serum Sodium: 131 mmol/L at 06/29/2022  4:50 AM Total Bilirubin: 2.8 mg/dL at 06/28/2022 12:42 PM Serum Albumin: 2.2 g/dL at 06/28/2022 12:42 PM INR(ratio): 1.1 at 06/28/2022 12:42 PM Age at listing (hypothetical): 59 years Sex: Female at 06/29/2022  4:50 AM  Renal #AKI #Hyponatremia  Mild AKI with a creatinine of 1.02 in addition to mild hyponatremia. Given history of liver disease she is at high risk for hepato-renal syndrome. She was resuscitated per sepsis guidelines. Will closely monitor urine output and kidney function.  -avoid nephrotoxic medications -urine sodium and creatinine for FENA -bid BMP  Hem/Onc Lovenox for prophylaxis. BID CBC to rule out active upper GI bleed.  -DIC workup negative  Endo  Hydrocortisone as adjunct glucocorticoids for management of severe pneumonia. Will monitor glucose closely.  ID #Cavitary Pneumonia #Septic Shock  Cavitary and lobar pneumonia of the LUL on CT with findings of septic shock and  leukocytosis. Will continue broad spectrum antibiotics pending cultures (Cefepime, Azithromycin, and Flagyl), d/c vancomycin with negative MRSA screen. Will rule out TB, obtain respiratory cultures, send fungal workup (immune suppressed given cirrhosis, BDG/aspergillus/crypto). Will also obtain HCV antibodies and viral load given this was not re-tested after treatment. Should her respiratory function worsen and she be intubated would benefit from bronch for BAL.  -broad spectrum antibiotics -respiratory cultures -rule out TB -fungal workup  Best Practice (right click and "Reselect all SmartList Selections" daily)   Diet/type: full liquids  DVT prophylaxis: LMWH GI prophylaxis: PPI Lines: N/A Foley:  N/A Code Status:  full code Last date of multidisciplinary goals of care discussion [06/29/2022]  Labs   CBC: Recent Labs  Lab  06/28/22 1242 06/29/22 0450  WBC 47.2* 27.8*  NEUTROABS 43.2*  --   HGB 9.9* 8.5*  HCT 30.3* 25.3*  MCV 97.4 94.8  PLT 520* 393     Basic Metabolic Panel: Recent Labs  Lab 06/28/22 1242 06/29/22 0450  NA 129* 131*  K 3.9 4.2  CL 93* 98  CO2 24 25  GLUCOSE 124* 149*  BUN 20 25*  CREATININE 1.02* 1.05*  CALCIUM 8.4* 7.9*  MG  --  1.4*  PHOS  --  6.2*    GFR: Estimated Creatinine Clearance: 45.2 mL/min (A) (by C-G formula based on SCr of 1.05 mg/dL (H)). Recent Labs  Lab 06/28/22 1242 06/28/22 1516 06/28/22 2132 06/29/22 0450  PROCALCITON  --  1.12  --   --   WBC 47.2*  --   --  27.8*  LATICACIDVEN 5.5* 4.1* 1.7  --      Liver Function Tests: Recent Labs  Lab 06/28/22 1242  AST 48*  ALT 27  ALKPHOS 223*  BILITOT 2.8*  PROT 7.1  ALBUMIN 2.2*    No results for input(s): "LIPASE", "AMYLASE" in the last 168 hours. No results for input(s): "AMMONIA" in the last 168 hours.  ABG    Component Value Date/Time   PHART 7.37 01/14/2018 2358   PCO2ART 46 01/14/2018 2358   PO2ART 67 (L) 01/14/2018 2358   HCO3 25.8 06/28/2022 2132   ACIDBASEDEF 1.8 06/28/2022 2132   O2SAT 80 06/28/2022 2132     Coagulation Profile: Recent Labs  Lab 06/28/22 1242  INR 1.1     Cardiac Enzymes: No results for input(s): "CKTOTAL", "CKMB", "CKMBINDEX", "TROPONINI" in the last 168 hours.  HbA1C: Hgb A1c MFr Bld  Date/Time Value Ref Range Status  01/15/2018 03:59 AM 5.4 4.8 - 5.6 % Final    Comment:    (NOTE) Pre diabetes:          5.7%-6.4% Diabetes:              >6.4% Glycemic control for   <7.0% adults with diabetes     CBG: Recent Labs  Lab 06/28/22 1645  GLUCAP 164*    Review of Systems:   Review of Systems  Constitutional:  Positive for chills, fever and malaise/fatigue.  Respiratory:  Positive for cough, sputum production, shortness of breath and wheezing.   Cardiovascular:  Positive for chest pain. Negative for leg swelling.  Skin:  Negative for  rash.     Past Medical History:  She,  has a past medical history of Abnormal CT scan, chest, Anxiety, Asthma, Breast cancer ([place]), Breast cancer ([place]) (06/18/2011), COPD (chronic obstructive pulmonary disease) ([place]), Endometriosis, Fatty liver, Hepatitis C (1998), History of substance abuse ([place]), Hot flashes related to aromatase inhibitor therapy, Hypertension, Lung mass, and  Peripheral neuropathy.   Surgical History:   Past Surgical History:  Procedure Laterality Date   BREAST LUMPECTOMY Right 2013   COLONOSCOPY WITH PROPOFOL N/A 03/20/2022   Procedure: COLONOSCOPY WITH PROPOFOL;  Surgeon: [name], MD;  Location: [facility] ENDOSCOPY;  Service: Gastroenterology;  Laterality: N/A;   ESOPHAGOGASTRODUODENOSCOPY (EGD) WITH PROPOFOL N/A 10/29/2021   Procedure: ESOPHAGOGASTRODUODENOSCOPY (EGD) WITH PROPOFOL;  Surgeon: [name], MD;  Location: [facility] ENDOSCOPY;  Service: Gastroenterology;  Laterality: N/A;   ESOPHAGOGASTRODUODENOSCOPY (EGD) WITH PROPOFOL N/A 02/14/2022   Procedure: ESOPHAGOGASTRODUODENOSCOPY (EGD) WITH PROPOFOL;  Surgeon: [name], MD;  Location[facility] ENDOSCOPY;  Service: Gastroenterology;  Laterality: N/A;  DRIVER 5 - [place]   Upper right leg benign tumor removed     VAGINAL HYSTERECTOMY  2001     Social History:   reports that she quit smoking about 21 months ago. Her smoking use included e-cigarettes. She has never used smokeless tobacco. She reports current alcohol use. She reports that she does not use drugs.   Family History:  Her family history includes Diabetes in her maternal grandmother; Heart disease in her mother; Hypertension in her mother; Lung cancer in her father and mother; Skin cancer in her mother. There is no history of Breast cancer.   Allergies Allergies  Allergen Reactions   Penicillins Swelling and Rash    Has patient had a PCN reaction causing immediate rash, facial/tongue/throat swelling,  SOB or lightheadedness with hypotension: Yes Has patient had a PCN reaction causing severe rash involving mucus membranes or skin necrosis: No Has patient had a PCN reaction that required hospitalization: No Has patient had a PCN reaction occurring within the last 10 years: No If all of the above answers are "NO", then may proceed with Cephalosporin use.      Home Medications  Prior to Admission medications   Medication Sig Start Date End Date Taking? Authorizing Provider  albuterol (PROVENTIL) (2.5 MG/3ML) 0.083% nebulizer solution Take 2.5 mg by nebulization every 6 (six) hours as needed for wheezing.  09/16/14   [provider]  diazepam (VALIUM) 2 MG tablet SMARTSIG:1 Tablet(s) By Mouth 10/25/21   [provider]  DULoxetine (CYMBALTA) 30 MG capsule Take 1 capsule (30 mg total) by mouth 2 (two) times daily. 09/19/17   Cammie Sickle, MD  gabapentin (NEURONTIN) 300 MG capsule Take 1 capsule (300 mg total) by mouth 3 (three) times daily. 10/25/20   Cammie Sickle, MD  HYDROcodone-acetaminophen (NORCO/VICODIN) 5-325 MG tablet Take 1 tablet by mouth 2 (two) times daily as needed. Patient not taking: Reported on 03/20/2022 02/06/22   [provider]  lamoTRIgine (LAMICTAL) 100 MG tablet Take by mouth. 11/07/21   [provider]  metoprolol succinate (TOPROL-XL) 100 MG 24 hr tablet Take 100 mg by mouth daily. Take with or immediately following a meal.    [provider]  Multiple Vitamin (MULTI-VITAMINS) TABS Take 1 tablet by mouth daily.     [provider]  nicotine (NICODERM CQ - DOSED IN MG/24 HOURS) 21 mg/24hr patch Place 1 patch (21 mg total) onto the skin daily. Patient not taking: Reported on 02/14/2022 11/01/21   Enzo Bi, MD  omeprazole (PRILOSEC) 20 MG capsule Take 40 mg by mouth every morning. 02/14/22   [provider]  OXYGEN Inhale 2 L into the lungs at bedtime and may repeat dose one time if needed.     [provider]  QUEtiapine (SEROQUEL)  25 MG tablet Take by mouth. 08/24/21   [provider]  SENEXON-S 8.6-50 MG tablet Take 2 tablets by mouth 2 (two) times daily. 12/18/21   [provider]  TRELEGY ELLIPTA 100-62.5-25 MCG/INH AEPB Inhale 1 puff into the lungs daily. 11/06/18   [provider]     Critical care time: 83 minutes    Armando Reichert, MD [facility] 06/29/2022 8:35 AM

## 2022-06-29 NOTE — Consult Note (Signed)
PHARMACY CONSULT NOTE - FOLLOW UP  Pharmacy Consult for Electrolyte Monitoring and Replacement   Recent Labs: Potassium (mmol/L)  Date Value  06/29/2022 4.2  07/04/2013 3.5   Magnesium (mg/dL)  Date Value  06/29/2022 1.4 (L)  10/20/2012 1.9   Calcium (mg/dL)  Date Value  06/29/2022 7.9 (L)   Calcium, Total (mg/dL)  Date Value  07/04/2013 8.8   Albumin (g/dL)  Date Value  06/28/2022 2.2 (L)  10/12/2014 4.1   Phosphorus (mg/dL)  Date Value  06/29/2022 6.2 (H)   Sodium (mmol/L)  Date Value  06/29/2022 131 (L)  07/04/2013 133 (L)    Assessment: Patient with hx of smoker, severe COPD on chronic O2 3 L nasal cannula, remote breast cancer, history of gastric ulcer and upper GI bleed, liver cirrhosis.   Presents to ED with shortness of breath and failure to thrive. Patient admitted to critical care and pharmacy consulted to manage electrolytes.  Goal of Therapy:  Electrolytes - WNL  Plan:  Na below goal - will defer further order to MD K within normal limits, and no other electrolytes to assess Will follow up AM results and replace as needed.  Pearla Dubonnet, PharmD Clinical Pharmacist 06/29/2022 11:03 AM

## 2022-06-29 NOTE — Procedures (Signed)
Central Venous Catheter Insertion Procedure Note  Regina Bernard  [id number]  [birth year]  Date:06/29/22  Time:6:18 AM   Provider Performing:Ruthel Martine A Ovida Delagarza   Procedure: Insertion of Non-tunneled Central Venous [phone]) with US guidance ([id number])   Indication(s) Medication administration and Difficult access  Consent Risks of the procedure as well as the alternatives and risks of each were explained to the patient and/or caregiver.  Consent for the procedure was obtained and is signed in the bedside chart  Anesthesia Topical only with 1% lidocaine   Timeout Verified patient identification, verified procedure, site/side was marked, verified correct patient position, special equipment/implants available, medications/allergies/relevant history reviewed, required imaging and test results available.  Sterile Technique Maximal sterile technique including full sterile barrier drape, hand hygiene, sterile gown, sterile gloves, mask, hair covering, sterile ultrasound probe cover (if used).  Procedure Description Area of catheter insertion was cleaned with chlorhexidine and draped in sterile fashion.  With real-time ultrasound guidance a central venous catheter was placed into the left internal jugular vein. Nonpulsatile blood flow and easy flushing noted in all ports.  The catheter was sutured in place and sterile dressing applied.  Complications/Tolerance None; patient tolerated the procedure well. Chest X-ray is ordered to verify placement for internal jugular or subclavian cannulation.   Chest x-ray is not ordered for femoral cannulation.  EBL Minimal  Specimen(s) None   Rufina Falco, DNP, CCRN, FNP-C, AGACNP-BC Acute Care & Family Nurse Practitioner  [facility]  See Amion for personal pager PCCM on call pager [phone] until 7 am

## 2022-06-29 NOTE — Plan of Care (Signed)

## 2022-06-30 DIAGNOSIS — J9601 Acute respiratory failure with hypoxia: Secondary | ICD-10-CM | POA: Diagnosis not present

## 2022-06-30 LAB — CBC WITH DIFFERENTIAL/PLATELET
Abs Immature Granulocytes: 0.34 10*3/uL — ABNORMAL HIGH (ref 0.00–0.07)
Basophils Absolute: 0.1 10*3/uL (ref 0.0–0.1)
Basophils Relative: 0 %
Eosinophils Absolute: 0 10*3/uL (ref 0.0–0.5)
Eosinophils Relative: 0 %
HCT: 22.8 % — ABNORMAL LOW (ref 36.0–46.0)
Hemoglobin: 7.6 g/dL — ABNORMAL LOW (ref 12.0–15.0)
Immature Granulocytes: 1 %
Lymphocytes Relative: 2 %
Lymphs Abs: 0.5 10*3/uL — ABNORMAL LOW (ref 0.7–4.0)
MCH: 31.9 pg (ref 26.0–34.0)
MCHC: 33.3 g/dL (ref 30.0–36.0)
MCV: 95.8 fL (ref 80.0–100.0)
Monocytes Absolute: 1 10*3/uL (ref 0.1–1.0)
Monocytes Relative: 4 %
Neutro Abs: 25.1 10*3/uL — ABNORMAL HIGH (ref 1.7–7.7)
Neutrophils Relative %: 93 %
Platelets: 348 10*3/uL (ref 150–400)
RBC: 2.38 MIL/uL — ABNORMAL LOW (ref 3.87–5.11)
RDW: 14.2 % (ref 11.5–15.5)
Smear Review: NORMAL
WBC: 26.9 10*3/uL — ABNORMAL HIGH (ref 4.0–10.5)
nRBC: 0 % (ref 0.0–0.2)

## 2022-06-30 LAB — BASIC METABOLIC PANEL
Anion gap: 6 (ref 5–15)
BUN: 25 mg/dL — ABNORMAL HIGH (ref 8–23)
CO2: 24 mmol/L (ref 22–32)
Calcium: 7.9 mg/dL — ABNORMAL LOW (ref 8.9–10.3)
Chloride: 102 mmol/L (ref 98–111)
Creatinine, Ser: 1.01 mg/dL — ABNORMAL HIGH (ref 0.44–1.00)
GFR, Estimated: >60 mL/min (ref >60)
Glucose, Bld: 105 mg/dL — ABNORMAL HIGH (ref 70–99)
Potassium: 3.9 mmol/L (ref 3.5–5.1)
Sodium: 132 mmol/L — ABNORMAL LOW (ref 135–145)

## 2022-06-30 LAB — RESPIRATORY PANEL BY PCR

## 2022-06-30 LAB — GLUCOSE, CAPILLARY
Glucose-Capillary: 93 mg/dL (ref 70–99)
Glucose-Capillary: 127 mg/dL — ABNORMAL HIGH (ref 70–99)
Glucose-Capillary: 96 mg/dL (ref 70–99)
Glucose-Capillary: 109 mg/dL — ABNORMAL HIGH (ref 70–99)

## 2022-06-30 LAB — MAGNESIUM: Magnesium: 2.1 mg/dL (ref 1.7–2.4)

## 2022-06-30 LAB — PHOSPHORUS: Phosphorus: 3.9 mg/dL (ref 2.5–4.6)

## 2022-06-30 MED ORDER — UMECLIDINIUM BROMIDE 62.5 MCG/ACT IN AEPB
1.0000 | INHALATION_SPRAY | Freq: Every day | RESPIRATORY_TRACT | Status: DC
  Administered 2022-06-30 – 2022-07-06 (×7): 1 via RESPIRATORY_TRACT
  Filled 2022-06-30: qty 7

## 2022-06-30 MED ORDER — FLUTICASONE FUROATE-VILANTEROL 100-25 MCG/ACT IN AEPB
1.0000 | INHALATION_SPRAY | Freq: Every day | RESPIRATORY_TRACT | Status: DC
  Administered 2022-06-30 – 2022-07-06 (×7): 1 via RESPIRATORY_TRACT
  Filled 2022-06-30: qty 28

## 2022-06-30 MED ORDER — PREDNISONE 20 MG PO TABS
40.0000 mg | ORAL_TABLET | Freq: Every day | ORAL | Status: DC
  Administered 2022-07-01 – 2022-07-03 (×3): 40 mg via ORAL
  Filled 2022-06-30 (×3): qty 2

## 2022-06-30 NOTE — Progress Notes (Signed)
  PROGRESS NOTE    Regina Bernard  [id number]:[id number] DOB: 29-Jul-1955 DOA: 06/28/2022 PCP: Juluis Pitch, MD  [id number]  LOS: 2 days   Brief hospital course:   Assessment & Plan: [name] is a pleasant 66 year old female with a history of COPD, long standing smoking history (at least 50 pack years) as well as previous history of drug and alcohol use, and liver cirrhosis (History of EtOH use and HCV infection) who is presenting to the hospital with shortness of breath and failure to thrive.    In the ED, she was noted to be hypotensive with significant leukocytosis. She was also hypoxic and had increased work of breathing, prompting initiation of BiPAP, with improvement in respiratory status. She was also started on broad spectrum antibiotics and a CT scan of the chest was performed. She was started on nor-epinephrine peripherally and admitted to the ICU for further care.   Pt was transferred to hospitalist service on 06/30/22.  #Anxiety #Depression -continue Duloxetine 30 mg daily -decrease gabapentin dose to 100 mg bid while acutely ill, increase as tolerated. -hold lamotrigine 100 mg daily   #Septic Shock # Lactic acidosis In the setting of pneumonia, initially on nor-epinephrine for vasopressor support. Lactic acid was elevated indicating compromised perfusion. Liver cirrhosis likely contributed to decreased LA clearance.  --d/c hydrocortisone today   #Acute Hypoxic and Hypercapnic Respiratory Failure --2/2 Cavitary Pneumonia and COPD Exacerbation Hypoxic on presentation with blood gas showing respiratory acidosis (7.2/65) that improved with BiPAP.  -nocturnal BiPAP and with worsening of respiratory status  # Cavitary Pneumonia --cont cefepime, flagyl and azithromycin --pending rule out TB, obtain respiratory cultures, send fungal workup  Advanced COPD with exacerbation --cont steroid as prednisone 40 mg daily --resume home Trelegy   #Liver Cirrhosis  #History of  UGIB Has a history of PUD and H. Pylori with previous GI bleed. Hemoglobin is stable --cont PPI BID   #AKI, ruled out --does not meet criteria   #Hyponatremia   DVT prophylaxis: Lovenox SQ Code Status: Full code  Family Communication: husband updated at bedside today Level of care: Med-Surg Dispo:   The patient is from: home Anticipated d/c is to: home Anticipated d/c date is: 2-3 days   Subjective and Interval History:  Pt reported doing better, eating better, less sputum production.   Objective: Vitals:   06/30/22 1200 06/30/22 1300 06/30/22 1400 06/30/22 1500  BP: 132/74 (!) 142/92 139/82 (!) 152/85  Pulse: 79 86 72 66  Resp: (!) 23 (!) '21 20 18  '$ Temp:      TempSrc:      SpO2: 99% 100% 100% 100%  Weight:      Height:        Intake/Output Summary (Last 24 hours) at 06/30/2022 1619 Last data filed at 06/30/2022 1400 Gross per 24 hour  Intake 500.25 ml  Output 1400 ml  Net -899.75 ml   Filed Weights   06/28/22 1643 06/29/22 0500 06/30/22 0500  Weight: 58.3 kg 57.4 kg 57.4 kg    Examination:   Constitutional: NAD, AAOx3 HEENT: conjunctivae and lids normal, EOMI CV: No cyanosis.   RESP: reduced air movement, mild wheezes, on 2L Neuro: II - XII grossly intact.   Psych: Normal mood and affect.  Appropriate judgement and reason   Data Reviewed: I have personally reviewed labs and imaging studies  Time spent: 50 minutes  Enzo Bi, MD [facility] If 7PM-7AM, please contact night-coverage 06/30/2022, 4:19 PM

## 2022-06-30 NOTE — Progress Notes (Signed)
Primary RN notified of central line removal order. Central line will be removed this shift per primary RN.

## 2022-07-01 DIAGNOSIS — J9601 Acute respiratory failure with hypoxia: Secondary | ICD-10-CM | POA: Diagnosis not present

## 2022-07-01 LAB — GLUCOSE, CAPILLARY
Glucose-Capillary: 62 mg/dL — ABNORMAL LOW (ref 70–99)
Glucose-Capillary: 105 mg/dL — ABNORMAL HIGH (ref 70–99)
Glucose-Capillary: 101 mg/dL — ABNORMAL HIGH (ref 70–99)
Glucose-Capillary: 187 mg/dL — ABNORMAL HIGH (ref 70–99)
Glucose-Capillary: 196 mg/dL — ABNORMAL HIGH (ref 70–99)

## 2022-07-01 LAB — BASIC METABOLIC PANEL
Anion gap: 6 (ref 5–15)
BUN: 24 mg/dL — ABNORMAL HIGH (ref 8–23)
CO2: 25 mmol/L (ref 22–32)
Calcium: 8.1 mg/dL — ABNORMAL LOW (ref 8.9–10.3)
Chloride: 105 mmol/L (ref 98–111)
Creatinine, Ser: 0.9 mg/dL (ref 0.44–1.00)
GFR, Estimated: >60 mL/min (ref >60)
Glucose, Bld: 66 mg/dL — ABNORMAL LOW (ref 70–99)
Potassium: 4 mmol/L (ref 3.5–5.1)
Sodium: 136 mmol/L (ref 135–145)

## 2022-07-01 LAB — CULTURE, RESPIRATORY W GRAM STAIN: Culture: NORMAL

## 2022-07-01 LAB — CBC
HCT: 24.6 % — ABNORMAL LOW (ref 36.0–46.0)
Hemoglobin: 8.3 g/dL — ABNORMAL LOW (ref 12.0–15.0)
MCH: 31.8 pg (ref 26.0–34.0)
MCHC: 33.7 g/dL (ref 30.0–36.0)
MCV: 94.3 fL (ref 80.0–100.0)
Platelets: 340 10*3/uL (ref 150–400)
RBC: 2.61 MIL/uL — ABNORMAL LOW (ref 3.87–5.11)
RDW: 14.2 % (ref 11.5–15.5)
WBC: 22 10*3/uL — ABNORMAL HIGH (ref 4.0–10.5)
nRBC: 0 % (ref 0.0–0.2)

## 2022-07-01 LAB — MAGNESIUM: Magnesium: 1.7 mg/dL (ref 1.7–2.4)

## 2022-07-01 MED ORDER — GABAPENTIN 300 MG PO CAPS
300.0000 mg | ORAL_CAPSULE | Freq: Three times a day (TID) | ORAL | Status: DC
  Administered 2022-07-01 – 2022-07-06 (×14): 300 mg via ORAL
  Filled 2022-07-01 (×14): qty 1

## 2022-07-01 MED ORDER — ACETAMINOPHEN 325 MG PO TABS
325.0000 mg | ORAL_TABLET | Freq: Once | ORAL | Status: AC
  Administered 2022-07-01: 325 mg via ORAL
  Filled 2022-07-01: qty 1

## 2022-07-01 MED ORDER — OXYCODONE HCL 5 MG PO TABS
5.0000 mg | ORAL_TABLET | Freq: Three times a day (TID) | ORAL | Status: DC | PRN
  Administered 2022-07-02 – 2022-07-06 (×12): 5 mg via ORAL
  Filled 2022-07-01 (×12): qty 1

## 2022-07-01 MED ORDER — IPRATROPIUM-ALBUTEROL 0.5-2.5 (3) MG/3ML IN SOLN
3.0000 mL | Freq: Two times a day (BID) | RESPIRATORY_TRACT | Status: DC
  Administered 2022-07-01 – 2022-07-06 (×10): 3 mL via RESPIRATORY_TRACT
  Filled 2022-07-01 (×10): qty 3

## 2022-07-01 MED ORDER — ACETYLCYSTEINE 20 % IN SOLN
4.0000 mL | Freq: Two times a day (BID) | RESPIRATORY_TRACT | Status: DC
  Administered 2022-07-01 – 2022-07-04 (×7): 4 mL via RESPIRATORY_TRACT
  Filled 2022-07-01 (×7): qty 4

## 2022-07-01 MED ORDER — LIDOCAINE 5 % EX PTCH
1.0000 | MEDICATED_PATCH | CUTANEOUS | Status: DC
  Administered 2022-07-01 – 2022-07-06 (×6): 1 via TRANSDERMAL
  Filled 2022-07-01 (×7): qty 1

## 2022-07-01 NOTE — Progress Notes (Signed)
Hypoglycemic Event  CBG: 62   Treatment: 4 oz juice/soda  Symptoms: None  Follow-up CBG: [id number] CBG Result:105  Possible Reasons for Event: Inadequate meal intake      Pablo Ledger

## 2022-07-01 NOTE — Progress Notes (Signed)
PROGRESS NOTE    Regina Bernard  [id number] DOB: April 19, 1956 DOA: 06/28/2022 PCP: Juluis Pitch, MD  [id number]  LOS: 3 days   Brief hospital course:   Assessment & Plan: Regina Bernard is a pleasant 66 year old female with a history of COPD, long standing smoking history (at least 50 pack years) as well as previous history of drug and alcohol use, and liver cirrhosis (History of EtOH use and HCV infection) who is presenting to the hospital with shortness of breath and failure to thrive.    In the ED, she was noted to be hypotensive with significant leukocytosis. She was also hypoxic and had increased work of breathing, prompting initiation of BiPAP, with improvement in respiratory status. She was also started on broad spectrum antibiotics and a CT scan of the chest was performed. She was started on nor-epinephrine peripherally and admitted to the ICU for further care.   Pt was transferred to hospitalist service on 06/30/22.  #Anxiety #Depression -continue Duloxetine 30 mg daily -hold lamotrigine 100 mg daily   #Septic Shock # Lactic acidosis In the setting of pneumonia, initially on nor-epinephrine for vasopressor support. Lactic acid was elevated indicating compromised perfusion. Liver cirrhosis likely contributed to decreased LA clearance.  --d/c'ed hydrocortisone    #Acute Hypoxic and Hypercapnic Respiratory Failure --2/2 Cavitary Pneumonia and COPD Exacerbation Hypoxic on presentation with blood gas showing respiratory acidosis (7.2/65) that improved with BiPAP.  -nocturnal BiPAP and with worsening of respiratory status  # Cavitary Pneumonia --cont cefepime, flagyl and azithromycin --pending rule out TB, obtain respiratory cultures, send fungal workup --start mucomyst neb BID  Advanced COPD with exacerbation --cont steroid as prednisone 40 mg daily --cont home Trelegy --start mucomyst neb BID   #Liver Cirrhosis  #History of UGIB Has a history of PUD and H.  Pylori with previous GI bleed. Hemoglobin is stable --cont PPI BID   #AKI, ruled out --does not meet criteria   #Hyponatremia  Chronic back pain --worsening  --resume gabapentin at home dose of 300 mg TID --Oxycodone 5 mg TID PRN   DVT prophylaxis: Lovenox SQ Code Status: Full code  Family Communication:  Level of care: Med-Surg Dispo:   The patient is from: home Anticipated d/c is to: home Anticipated d/c date is: 2-3 days   Subjective and Interval History:  Pt started coughing up thick tan sputum after receiving mucomyst neb.  Pt complained of back pain, which is chronic.   Objective: Vitals:   07/01/22 0144 07/01/22 0700 07/01/22 1224 07/01/22 1625  BP: (!) 146/92 126/82 115/72 132/77  Pulse: (!) 101 88 89 83  Resp: '20 18 18 16  '$ Temp: 98.9 F (37.2 C) 98.2 F (36.8 C) 98.7 F (37.1 C) 98.5 F (36.9 C)  TempSrc: Oral Oral    SpO2: 100% 100% 99% 100%  Weight:      Height:        Intake/Output Summary (Last 24 hours) at 07/01/2022 1911 Last data filed at 07/01/2022 3734 Gross per 24 hour  Intake 750 ml  Output --  Net 750 ml   Filed Weights   06/28/22 1643 06/29/22 0500 06/30/22 0500  Weight: 58.3 kg 57.4 kg 57.4 kg    Examination:   Constitutional: NAD, AAOx3 HEENT: conjunctivae and lids normal, EOMI CV: No cyanosis.   RESP: normal respiratory effort, on 4L Neuro: II - XII grossly intact.   Psych: Normal mood and affect.  Appropriate judgement and reason   Data Reviewed: I have personally reviewed labs and  imaging studies  Time spent: 35 minutes  Enzo Bi, MD [facility] If 7PM-7AM, please contact night-coverage 07/01/2022, 7:11 PM

## 2022-07-02 DIAGNOSIS — J441 Chronic obstructive pulmonary disease with (acute) exacerbation: Secondary | ICD-10-CM | POA: Diagnosis not present

## 2022-07-02 DIAGNOSIS — J9601 Acute respiratory failure with hypoxia: Secondary | ICD-10-CM | POA: Diagnosis not present

## 2022-07-02 DIAGNOSIS — Z87891 Personal history of nicotine dependence: Secondary | ICD-10-CM

## 2022-07-02 DIAGNOSIS — J850 Gangrene and necrosis of lung: Secondary | ICD-10-CM

## 2022-07-02 DIAGNOSIS — K7469 Other cirrhosis of liver: Secondary | ICD-10-CM

## 2022-07-02 LAB — HEPATIC FUNCTION PANEL
ALT: 14 U/L (ref 0–44)
AST: 24 U/L (ref 15–41)
Albumin: 1.8 g/dL — ABNORMAL LOW (ref 3.5–5.0)
Alkaline Phosphatase: 162 U/L — ABNORMAL HIGH (ref 38–126)
Bilirubin, Direct: 0.3 mg/dL — ABNORMAL HIGH (ref 0.0–0.2)
Indirect Bilirubin: 0.7 mg/dL (ref 0.3–0.9)
Total Bilirubin: 1 mg/dL (ref 0.3–1.2)
Total Protein: 5.7 g/dL — ABNORMAL LOW (ref 6.5–8.1)

## 2022-07-02 LAB — MAGNESIUM: Magnesium: 1.6 mg/dL — ABNORMAL LOW (ref 1.7–2.4)

## 2022-07-02 LAB — BASIC METABOLIC PANEL
Anion gap: 7 (ref 5–15)
BUN: 17 mg/dL (ref 8–23)
CO2: 25 mmol/L (ref 22–32)
Calcium: 8.2 mg/dL — ABNORMAL LOW (ref 8.9–10.3)
Chloride: 103 mmol/L (ref 98–111)
Creatinine, Ser: 0.86 mg/dL (ref 0.44–1.00)
GFR, Estimated: >60 mL/min (ref >60)
Glucose, Bld: 68 mg/dL — ABNORMAL LOW (ref 70–99)
Potassium: 3.9 mmol/L (ref 3.5–5.1)
Sodium: 135 mmol/L (ref 135–145)

## 2022-07-02 LAB — CBC
HCT: 23.8 % — ABNORMAL LOW (ref 36.0–46.0)
Hemoglobin: 8 g/dL — ABNORMAL LOW (ref 12.0–15.0)
MCH: 31.5 pg (ref 26.0–34.0)
MCHC: 33.6 g/dL (ref 30.0–36.0)
MCV: 93.7 fL (ref 80.0–100.0)
Platelets: 320 10*3/uL (ref 150–400)
RBC: 2.54 MIL/uL — ABNORMAL LOW (ref 3.87–5.11)
RDW: 14.5 % (ref 11.5–15.5)
WBC: 20.3 10*3/uL — ABNORMAL HIGH (ref 4.0–10.5)
nRBC: 0 % (ref 0.0–0.2)

## 2022-07-02 LAB — HCV RT-PCR, QUANT (NON-GRAPH): Hepatitis C Quantitation: NOT DETECTED IU/mL

## 2022-07-02 LAB — ACID FAST SMEAR (AFB, MYCOBACTERIA)
Acid Fast Smear: NEGATIVE
Acid Fast Smear: NEGATIVE
Acid Fast Smear: NEGATIVE

## 2022-07-02 LAB — GLUCOSE, CAPILLARY
Glucose-Capillary: 65 mg/dL — ABNORMAL LOW (ref 70–99)
Glucose-Capillary: 108 mg/dL — ABNORMAL HIGH (ref 70–99)
Glucose-Capillary: 109 mg/dL — ABNORMAL HIGH (ref 70–99)
Glucose-Capillary: 146 mg/dL — ABNORMAL HIGH (ref 70–99)
Glucose-Capillary: 220 mg/dL — ABNORMAL HIGH (ref 70–99)

## 2022-07-02 LAB — URINE CULTURE: Culture: 40000 — AB

## 2022-07-02 LAB — HCV AB W REFLEX TO QUANT PCR: HCV Ab: REACTIVE — AB

## 2022-07-02 LAB — CRYPTOCOCCUS ANTIGEN, SERUM: Cryptococcus Antigen, Serum: NEGATIVE

## 2022-07-02 MED ORDER — METOPROLOL SUCCINATE ER 50 MG PO TB24
100.0000 mg | ORAL_TABLET | Freq: Every day | ORAL | Status: DC
  Administered 2022-07-03 – 2022-07-06 (×4): 100 mg via ORAL
  Filled 2022-07-02 (×5): qty 2

## 2022-07-02 MED ORDER — MAGNESIUM SULFATE 2 GM/50ML IV SOLN
2.0000 g | Freq: Once | INTRAVENOUS | Status: AC
  Administered 2022-07-02: 2 g via INTRAVENOUS
  Filled 2022-07-02: qty 50

## 2022-07-02 MED ORDER — LAMOTRIGINE 100 MG PO TABS
100.0000 mg | ORAL_TABLET | Freq: Every day | ORAL | Status: DC
  Administered 2022-07-03 – 2022-07-06 (×4): 100 mg via ORAL
  Filled 2022-07-02 (×4): qty 1

## 2022-07-02 MED ORDER — SODIUM CHLORIDE 0.9 % IV SOLN
1.0000 g | Freq: Three times a day (TID) | INTRAVENOUS | Status: DC
  Administered 2022-07-03 – 2022-07-05 (×8): 1 g via INTRAVENOUS
  Filled 2022-07-02 (×6): qty 20
  Filled 2022-07-02 (×3): qty 1

## 2022-07-02 NOTE — Consult Note (Addendum)
NAME: [name]  DOB: 15-Aug-1955  MRN: [id number]  Date/Time: 07/02/2022 12:58 PM  REQUESTING PROVIDER: Huston Foley Subjective:  REASON FOR CONSULT: Necrotizing pneumonia  ? Regina Bernard is a 66 y.o. with a history of COPD, smoker, h/o breast cancer , anemia, GI bleed, vertebral compression fracture presents with not feeling well for more than 2 weeks. She had fever and night sweats, cough , for more than 2 weeks She had contacted her pulmonologist and was prescribed azithromycin and prednisone on 06/17/22, ( which she did not get it immediately as it was sent to a mail order pharmacy)was repeated twice after that.  In the ED vitals  06/28/22  BP 101/81  Temp 97.8 F (36.6 C)  Pulse Rate 72  Resp 22 !  SpO2 100 %  O2 Flow Rate (L/min) 5 L/min  Weight 128 lb 8.5 oz    Latest Reference Range & Units 06/28/22  WBC 4.0 - 10.5 K/uL 47.2 (H)  Hemoglobin 12.0 - 15.0 g/dL 9.9 (L)  HCT 36.0 - 46.0 % 30.3 (L)  Platelets 150 - 400 K/uL 520 (H)  Creatinine 0.44 - 1.00 mg/dL 1.02 (H)  CTA chest showed New, very extensive consolidation of the left upper lobe and lingula, with a large air and fluid containing cavitary component within the lingula measuring at least 7.9 x 5.8 cm. Underlying bronchiectasis Was initially admitted to ICU- was started on vanco/cefepime/flagyl/azithromycin Sputum culture from 06/28/22 pseudomonas MRSA nares neg AFB X 3 sputums sent pending smear result Seen by pulmonary and work up for fungal infections have been sent I am asked to see the patient for the necrotizing pneumonia   Past Medical History:  Diagnosis Date   Abnormal CT scan, chest    Anxiety    Asthma    Breast cancer ([place])    Breast CA- Right    Breast cancer ([place]) 06/18/2011   right breast cancer   COPD (chronic obstructive pulmonary disease) ([place])    Endometriosis    tx with vaginal hysterectomy   Fatty liver    Hepatitis C 1998   [facility] trial treatment in-patient study 2005. HCV 740,  05/20/11.   History of substance abuse ([place])    Heroine, cocaine, marijuana. States all of them. Heavy use 1995 to 2000. Occasional use before and after. None since 2006.    Hot flashes related to aromatase inhibitor therapy    Hypertension    Lung mass    Peripheral neuropathy    in hands and feet    Past Surgical History:  Procedure Laterality Date   BREAST LUMPECTOMY Right 2013   COLONOSCOPY WITH PROPOFOL N/A 03/20/2022   Procedure: COLONOSCOPY WITH PROPOFOL;  Surgeon: [name], MD;  Location: [facility] ENDOSCOPY;  Service: Gastroenterology;  Laterality: N/A;   ESOPHAGOGASTRODUODENOSCOPY (EGD) WITH PROPOFOL N/A 10/29/2021   Procedure: ESOPHAGOGASTRODUODENOSCOPY (EGD) WITH PROPOFOL;  Surgeon: [name], MD;  Location[facility] ENDOSCOPY;  Service: Gastroenterology;  Laterality: N/A;   ESOPHAGOGASTRODUODENOSCOPY (EGD) WITH PROPOFOL N/A 02/14/2022   Procedure: ESOPHAGOGASTRODUODENOSCOPY (EGD) WITH PROPOFOL;  Surgeon: [name], MD;  Location: [facility] ENDOSCOPY;  Service: Gastroenterology;  Laterality: N/A;  DRIVER 5 - [place]   Upper right leg benign tumor removed     VAGINAL HYSTERECTOMY  2001    Social History   Socioeconomic History   Marital status: Married    Spouse name: Not on file   Number of children: Not on file   Years of  education: Not on file   Highest education level: Not on file  Occupational History   Not on file  Tobacco Use   Smoking status: Former    Types: E-cigarettes    Quit date: 09/2020    Years since quitting: 1.8   Smokeless tobacco: Never   Tobacco comments:    last [place] use 6 days ago (11/30/15)  Vaping Use   Vaping Use: Every day  Substance and Sexual Activity   Alcohol use: Yes    Comment: occ   Drug use: No   Sexual activity: Never  Other Topics Concern   Not on file  Social History Narrative   Not on file   Social Determinants of Health   Financial Resource Strain: Not on file   Food Insecurity: Not on file  Transportation Needs: Not on file  Physical Activity: Not on file  Stress: Not on file  Social Connections: Not on file  Intimate Partner Violence: Not on file    Family History  Problem Relation Age of Onset   Diabetes Maternal Grandmother    Lung cancer Father    Lung cancer Mother    Hypertension Mother    Heart disease Mother    Skin cancer Mother    Breast cancer Neg Hx    Allergies  Allergen Reactions   Penicillins Swelling and Rash    Has patient had a PCN reaction causing immediate rash, facial/tongue/throat swelling, SOB or lightheadedness with hypotension: Yes Has patient had a PCN reaction causing severe rash involving mucus membranes or skin necrosis: No Has patient had a PCN reaction that required hospitalization: No Has patient had a PCN reaction occurring within the last 10 years: No If all of the above answers are "NO", then may proceed with Cephalosporin use.    I? Current Facility-Administered Medications  Medication Dose Route Frequency Provider Last Rate Last Admin   acetylcysteine (MUCOMYST) 20 % nebulizer / oral solution 4 mL  4 mL Nebulization BID Enzo Bi, MD   4 mL at 07/02/22 0823   albuterol (PROVENTIL) (2.5 MG/3ML) 0.083% nebulizer solution 2.5 mg  2.5 mg Nebulization Q6H PRN Armando Reichert, MD   2.5 mg at 07/01/22 0825   azithromycin (ZITHROMAX) 500 mg in sodium chloride 0.9 % 250 mL IVPB  500 mg Intravenous Q24H Oswald Hillock, RPH 250 mL/hr at 07/01/22 1448 500 mg at 07/01/22 1448   ceFEPIme (MAXIPIME) 2 g in sodium chloride 0.9 % 100 mL IVPB  2 g Intravenous Q12H Dgayli, Berdine Addison, MD 200 mL/hr at 07/02/22 1059 Infusion Verify at 07/02/22 1059   docusate sodium (COLACE) capsule 100 mg  100 mg Oral BID PRN Armando Reichert, MD       DULoxetine (CYMBALTA) DR capsule 30 mg  30 mg Oral Daily Dgayli, Khabib, MD   30 mg at 07/02/22 1039   enoxaparin (LOVENOX) injection 40 mg  40 mg Subcutaneous Q24H Dgayli, Berdine Addison, MD   40 mg  at 07/01/22 2337   fluticasone furoate-vilanterol (BREO ELLIPTA) 100-25 MCG/ACT 1 puff  1 puff Inhalation Daily Enzo Bi, MD   1 puff at 07/02/22 1039   And   umeclidinium bromide (INCRUSE ELLIPTA) 62.5 MCG/ACT 1 puff  1 puff Inhalation Daily Enzo Bi, MD   1 puff at 07/02/22 1038   gabapentin (NEURONTIN) capsule 300 mg  300 mg Oral TID Enzo Bi, MD   300 mg at 07/02/22 1039   insulin aspart (novoLOG) injection 0-6 Units  0-6 Units Subcutaneous TID WC Dgayli, Khabib,  MD   1 Units at 07/01/22 1700   ipratropium-albuterol (DUONEB) 0.5-2.5 (3) MG/3ML nebulizer solution 3 mL  3 mL Nebulization BID Enzo Bi, MD   3 mL at 07/02/22 0822   lidocaine (LIDODERM) 5 % 1 patch  1 patch Transdermal Q24H Foust, Katy L, NP   1 patch at 07/02/22 4580   magnesium sulfate IVPB 2 g 50 mL  2 g Intravenous Once Enzo Bi, MD 50 mL/hr at 07/02/22 1231 2 g at 07/02/22 1231   metroNIDAZOLE (FLAGYL) IVPB 500 mg  500 mg Intravenous Q8H Dgayli, Berdine Addison, MD 100 mL/hr at 07/02/22 1059 Infusion Verify at 07/02/22 1059   Oral care mouth rinse  15 mL Mouth Rinse PRN Armando Reichert, MD       oxyCODONE (Oxy IR/ROXICODONE) immediate release tablet 5 mg  5 mg Oral TID PRN Enzo Bi, MD   5 mg at 07/02/22 1051   pantoprazole (PROTONIX) injection 40 mg  40 mg Intravenous Q12H Armando Reichert, MD   40 mg at 07/02/22 1039   polyethylene glycol (MIRALAX / GLYCOLAX) packet 17 g  17 g Oral Daily PRN Armando Reichert, MD       predniSONE (DELTASONE) tablet 40 mg  40 mg Oral Q breakfast Enzo Bi, MD   40 mg at 07/02/22 1039     Abtx:  Anti-infectives (From admission, onward)    Start     Dose/Rate Route Frequency Ordered Stop   06/29/22 0100  ceFEPIme (MAXIPIME) 2 g in sodium chloride 0.9 % 100 mL IVPB        2 g 200 mL/hr over 30 Minutes Intravenous Every 12 hours 06/28/22 1542     06/28/22 1600  metroNIDAZOLE (FLAGYL) IVPB 500 mg        500 mg 100 mL/hr over 60 Minutes Intravenous Every 8 hours 06/28/22 1548     06/28/22 1500   azithromycin (ZITHROMAX) 500 mg in sodium chloride 0.9 % 250 mL IVPB        500 mg 250 mL/hr over 60 Minutes Intravenous Every 24 hours 06/28/22 1450 07/03/22 1459   06/28/22 1330  vancomycin (VANCOREADY) IVPB 1250 mg/250 mL        1,250 mg 166.7 mL/hr over 90 Minutes Intravenous  Once 06/28/22 1319 06/28/22 1619   06/28/22 1230  ceFEPIme (MAXIPIME) 2 g in sodium chloride 0.9 % 100 mL IVPB        2 g 200 mL/hr over 30 Minutes Intravenous  Once 06/28/22 1222 06/28/22 1343   06/28/22 1230  metroNIDAZOLE (FLAGYL) IVPB 500 mg  Status:  Discontinued        500 mg 100 mL/hr over 60 Minutes Intravenous  Once 06/28/22 1222 06/28/22 1231       REVIEW OF SYSTEMS:  Const:fever,  chills,  weight loss of 70 pounds she says Eyes: negative diplopia or visual changes, negative eye pain ENT: negative coryza, negative sore throat Resp:  cough, , dyspnea, blood stained sputum Cards: negative for chest pain, palpitations, lower extremity edema GU: negative for frequency, dysuria and hematuria GI: Negative for abdominal pain, diarrhea, bleeding, constipation Has poor appetite Skin: negative for rash and pruritus Heme: negative for easy bruising and gum/nose bleeding MS: fatigue Back pain for months- underwent kyphoplasty Neurolo:negative for headaches, dizziness, vertigo, memory problems  Psych: negative for feelings of anxiety, depression  Endocrine: negative for thyroid, diabetes Allergy/Immunology- penicillin Smoker until 3 weeks - 1 month ago Drinks alcohol Objective:  VITALS:  BP (!) 167/85 (BP Location: Left Arm)  Pulse 79   Temp 97.8 F (36.6 C)   Resp 20   Ht 5' 3.5" (1.613 m)   Wt 57.4 kg   SpO2 97%   BMI 22.06 kg/m   PHYSICAL EXAM:  General: Alert, cooperative, chronically ill Head: Normocephalic, without obvious abnormality, atraumatic. Eyes: Conjunctivae clear, anicteric sclerae. Pupils are equal ENT Nares normal. No drainage or sinus tenderness. Lips, mucosa, and tongue  normal. No Thrush Dentition fair Neck: Supple, symmetrical, no adenopathy, thyroid: non tender no carotid bruit and no JVD. Back: No CVA tenderness. Lungs:b/l air entry Crepts left side rhonchi Heart: s1s2 Abdomen: Soft, non-tender,not distended. Bowel sounds normal. No masses Extremities: atraumatic, no cyanosis. No edema. No clubbing Skin: No rashes or lesions. Or bruising Lymph: Cervical, supraclavicular normal. Neurologic: Grossly non-focal Pertinent Labs Lab Results CBC    Component Value Date/Time   WBC 20.3 (H) 07/02/2022 0755   RBC 2.54 (L) 07/02/2022 0755   HGB 8.0 (L) 07/02/2022 0755   HGB 14.5 10/12/2014 1239   HCT 23.8 (L) 07/02/2022 0755   HCT 43.6 10/12/2014 1239   PLT 320 07/02/2022 0755   PLT 248 10/12/2014 1239   MCV 93.7 07/02/2022 0755   MCV 104 (H) 10/12/2014 1239   MCH 31.5 07/02/2022 0755   MCHC 33.6 07/02/2022 0755   RDW 14.5 07/02/2022 0755   RDW 14.3 10/12/2014 1239   LYMPHSABS 0.5 (L) 06/30/2022 0454   LYMPHSABS 1.2 10/12/2014 1239   MONOABS 1.0 06/30/2022 0454   MONOABS 0.6 10/12/2014 1239   EOSABS 0.0 06/30/2022 0454   EOSABS 0.2 10/12/2014 1239   BASOSABS 0.1 06/30/2022 0454   BASOSABS 0.0 10/12/2014 1239       Latest Ref Rng & Units 07/02/2022    7:55 AM 07/01/2022    6:24 AM 06/30/2022    4:54 AM  CMP  Glucose 70 - 99 mg/dL 68  66  105   BUN 8 - 23 mg/dL '17  24  25   '$ Creatinine 0.44 - 1.00 mg/dL 0.86  0.90  1.01   Sodium 135 - 145 mmol/L 135  136  132   Potassium 3.5 - 5.1 mmol/L 3.9  4.0  3.9   Chloride 98 - 111 mmol/L 103  105  102   CO2 22 - 32 mmol/L '25  25  24   '$ Calcium 8.9 - 10.3 mg/dL 8.2  8.1  7.9       Microbiology: Recent Results (from the past 240 hour(s))  Urine Culture     Status: Abnormal   Collection Time: 06/28/22 10:05 AM   Specimen: Urine, Clean Catch  Result Value Ref Range Status   Specimen Description   Final    URINE, CLEAN CATCH Performed at [facility], [address]  [place], [place] [address]    Special Requests   Final    NONE Performed at [facility], [address] [place], [place] [address]    Culture (A)  Final    40,000 COLONIES/mL ENTEROCOCCUS FAECIUM VANCOMYCIN RESISTANT ENTEROCOCCUS ISOLATED    Report Status 07/02/2022 FINAL  Final   Organism ID, Bacteria ENTEROCOCCUS FAECIUM (A)  Final      Susceptibility   Enterococcus faecium - MIC*    AMPICILLIN >=32 RESISTANT Resistant     NITROFURANTOIN 64 INTERMEDIATE Intermediate     VANCOMYCIN >=32 RESISTANT Resistant     GENTAMICIN SYNERGY SENSITIVE Sensitive     LINEZOLID 2 SENSITIVE Sensitive     * 40,000 COLONIES/mL ENTEROCOCCUS FAECIUM  Culture,  blood (Routine x 2)     Status: None (Preliminary result)   Collection Time: 06/28/22 12:42 PM   Specimen: BLOOD  Result Value Ref Range Status   Specimen Description BLOOD  LEFT FOREARM  Final   Special Requests   Final    BOTTLES DRAWN AEROBIC AND ANAEROBIC Blood Culture adequate volume   Culture   Final    NO GROWTH 4 DAYS Performed at [facility], [address] [place], [place] [address]    Report Status PENDING  Incomplete  Culture, blood (Routine x 2)     Status: None (Preliminary result)   Collection Time: 06/28/22  3:16 PM   Specimen: BLOOD  Result Value Ref Range Status   Specimen Description BLOOD BLOOD LEFT ARM  Final   Special Requests   Final    BOTTLES DRAWN AEROBIC AND ANAEROBIC Blood Culture results may not be optimal due to an inadequate volume of blood received in culture bottles   Culture   Final    NO GROWTH 4 DAYS Performed at [facility], [address], [place], [place] [address]    Report Status PENDING  Incomplete  Culture, Respiratory w Gram Stain (tracheal aspirate)     Status: None   Collection Time: 06/28/22  3:16 PM   Specimen: SPU; Respiratory  Result Value Ref Range Status   Specimen Description   Final    SPUTUM Performed at [facility], [address],  [place], [place] [address]    Special Requests   Final    SPUTUM Performed at [facility], [address] [place], [place] [address]    Gram Stain   Final    ABUNDANT WBC PRESENT, PREDOMINANTLY PMN ABUNDANT GRAM POSITIVE COCCI IN CHAINS FEW YEAST FEW GRAM NEGATIVE RODS Performed at [facility], [place] [address], [place], [place] [address]    Culture RARE PSEUDOMONAS AERUGINOSA  Final   Report Status 07/01/2022 FINAL  Final   Organism ID, Bacteria PSEUDOMONAS AERUGINOSA  Final      Susceptibility   Pseudomonas aeruginosa - MIC*    CEFTAZIDIME 2 SENSITIVE Sensitive     CIPROFLOXACIN <=0.25 SENSITIVE Sensitive     GENTAMICIN 2 SENSITIVE Sensitive     IMIPENEM 2 SENSITIVE Sensitive     PIP/TAZO <=4 SENSITIVE Sensitive     CEFEPIME 1 SENSITIVE Sensitive     * RARE PSEUDOMONAS AERUGINOSA  MRSA Next Gen by PCR, Nasal     Status: None   Collection Time: 06/28/22  4:46 PM   Specimen: Nasal Mucosa; Nasal Swab  Result Value Ref Range Status   MRSA by PCR Next Gen NOT DETECTED NOT DETECTED Final    Comment: (NOTE) The GeneXpert MRSA Assay (FDA approved for NASAL specimens only), is one component of a comprehensive MRSA colonization surveillance program. It is not intended to diagnose MRSA infection nor to guide or monitor treatment for MRSA infections. Test performance is not FDA approved in patients less than 55 years old. Performed at [facility], [address] [place], [place] [address]   Expectorated Sputum Assessment w Gram Stain, Rflx to Resp Cult     Status: None   Collection Time: 06/29/22  1:25 AM   Specimen: Expectorated Sputum  Result Value Ref Range Status   Specimen Description EXPECTORATED SPUTUM  Final   Special Requests NONE  Final   Sputum evaluation   Final    THIS SPECIMEN IS ACCEPTABLE FOR SPUTUM CULTURE Performed at [facility]  Lab, [place], [place] [address]    Report Status 06/29/2022 FINAL  Final  Culture,  Respiratory w Gram Stain     Status: None   Collection Time: 06/29/22  1:25 AM  Result Value Ref Range Status   Specimen Description   Final    EXPECTORATED SPUTUM Performed at [facility], [address] [place], [place] [address]    Special Requests   Final    NONE Reflexed from [id number] Performed at [facility], [address] [place], [place] [address]    Gram Stain   Final    ABUNDANT WBC PRESENT, PREDOMINANTLY PMN MODERATE GRAM POSITIVE COCCI IN PAIRS IN CHAINS FEW GRAM NEGATIVE RODS FEW YEAST    Culture   Final    ABUNDANT Consistent with normal respiratory flora. No Pseudomonas species isolated Performed at [facility] [address] [place], [place] [address]    Report Status 07/01/2022 FINAL  Final  Respiratory (~20 pathogens) panel by PCR     Status: None   Collection Time: 06/29/22  5:11 PM   Specimen: Nasopharyngeal Swab; Respiratory  Result Value Ref Range Status   Adenovirus NOT DETECTED NOT DETECTED Final   Coronavirus 229E NOT DETECTED NOT DETECTED Final    Comment: (NOTE) The Coronavirus on the Respiratory Panel, DOES NOT test for the novel  Coronavirus (2019 nCoV)    Coronavirus HKU1 NOT DETECTED NOT DETECTED Final   Coronavirus NL63 NOT DETECTED NOT DETECTED Final   Coronavirus OC43 NOT DETECTED NOT DETECTED Final   Metapneumovirus NOT DETECTED NOT DETECTED Final   Rhinovirus / Enterovirus NOT DETECTED NOT DETECTED Final   Influenza A NOT DETECTED NOT DETECTED Final   Influenza B NOT DETECTED NOT DETECTED Final   Parainfluenza Virus 1 NOT DETECTED NOT DETECTED Final   Parainfluenza Virus 2 NOT DETECTED NOT DETECTED Final   Parainfluenza Virus 3 NOT DETECTED NOT DETECTED Final   Parainfluenza Virus 4 NOT DETECTED NOT DETECTED Final   Respiratory Syncytial Virus NOT DETECTED NOT DETECTED Final   Bordetella pertussis NOT DETECTED NOT DETECTED Final   Bordetella Parapertussis NOT DETECTED NOT DETECTED Final   Chlamydophila  pneumoniae NOT DETECTED NOT DETECTED Final   Mycoplasma pneumoniae NOT DETECTED NOT DETECTED Final    Comment: Performed at [facility], [place] [address] [place], [place] [address]  Resp panel by RT-PCR (RSV, Flu A&B, Covid) Anterior Nasal Swab     Status: None   Collection Time: 06/29/22  5:11 PM   Specimen: Anterior Nasal Swab  Result Value Ref Range Status   SARS Coronavirus 2 by RT PCR NEGATIVE NEGATIVE Final    Comment: (NOTE) SARS-CoV-2 target nucleic acids are NOT DETECTED.  The SARS-CoV-2 RNA is generally detectable in upper respiratory specimens during the acute phase of infection. The lowest concentration of SARS-CoV-2 viral copies this assay can detect is 138 copies/mL. A negative result does not preclude SARS-Cov-2 infection and should not be used as the sole basis for treatment or other patient management decisions. A negative result may occur with  improper specimen collection/handling, submission of specimen other than nasopharyngeal swab, presence of viral mutation(s) within the areas targeted by this assay, and inadequate number of viral copies(<138 copies/mL). A negative result must be combined with clinical observations, patient history, and epidemiological information. The expected result is Negative.  Fact Sheet for Patients:  EntrepreneurPulse.com.au  Fact Sheet for Healthcare Providers:  IncredibleEmployment.be  This test is no t yet approved or  cleared by the [place] and  has been authorized for detection and/or diagnosis of SARS-CoV-2 by FDA under an Emergency Use Authorization (EUA). This EUA will remain  in effect (meaning this test can be used) for the duration of the COVID-19 declaration under Section 564(b)(1) of the Act, 21 U.S.C.section 360bbb-3(b)(1), unless the authorization is terminated  or revoked sooner.       Influenza A by PCR NEGATIVE NEGATIVE Final   Influenza B by PCR NEGATIVE  NEGATIVE Final    Comment: (NOTE) The Xpert Xpress SARS-CoV-2/FLU/RSV plus assay is intended as an aid in the diagnosis of influenza from Nasopharyngeal swab specimens and should not be used as a sole basis for treatment. Nasal washings and aspirates are unacceptable for Xpert Xpress SARS-CoV-2/FLU/RSV testing.  Fact Sheet for Patients: EntrepreneurPulse.com.au  Fact Sheet for Healthcare Providers: IncredibleEmployment.be  This test is not yet approved or cleared by the [place] FDA and has been authorized for detection and/or diagnosis of SARS-CoV-2 by FDA under an Emergency Use Authorization (EUA). This EUA will remain in effect (meaning this test can be used) for the duration of the COVID-19 declaration under Section 564(b)(1) of the Act, 21 U.S.C. section 360bbb-3(b)(1), unless the authorization is terminated or revoked.     Resp Syncytial Virus by PCR NEGATIVE NEGATIVE Final    Comment: (NOTE) Fact Sheet for Patients: EntrepreneurPulse.com.au  Fact Sheet for Healthcare Providers: IncredibleEmployment.be  This test is not yet approved or cleared by the [place] FDA and has been authorized for detection and/or diagnosis of SARS-CoV-2 by FDA under an Emergency Use Authorization (EUA). This EUA will remain in effect (meaning this test can be used) for the duration of the COVID-19 declaration under Section 564(b)(1) of the Act, 21 U.S.C. section 360bbb-3(b)(1), unless the authorization is terminated or revoked.  Performed at [facility], [address] [place], [place] [address]     IMAGING RESULTS:  I have personally reviewed the films ?Dec 2023                               NOV 2023    Extensive left upper lobe and lingula consolidation with air /fluid cavity  Impression/Recommendation 66 yr female with h/o COPD, smoker, underlying emphysema presents with cough, fever, sob,  fatigue of 3 weeks?  Necrotizing pneumonia left upper lobe with leukemoid reaction, pseudomonas in culture Afb pending Underlying COPD/emphysema Currently on cefepime, vanco, flagyl Dc all and start meropenem She will need 4-6 weeks of antibiotic Will give IV as long as leucocytosis persist  COPD with acute hypoxic resp failure - on prednisone Anemia   H/o GI ulcer   HEPC treated- undetectable VL Cirrhosis  H/o Compression fracture of vertebrae - s/p kyphoplasty  H/o ca breast s/p lumpectomy - was on aromatase inhibitor but stopped because of SI- has missed her appt with oncology this year Need to make sure no pulmonary mets  ?? ___________________________________________________ Discussed with patient, requesting provider Note:  This document was prepared using Dragon voice recognition software and may include unintentional dictation errors.

## 2022-07-02 NOTE — Progress Notes (Signed)
PROGRESS NOTE    Regina Bernard  WUJ:[id number] DOB: Oct 08, 1955 DOA: 06/28/2022 PCP: Juluis Pitch, MD  128A/128A-AA  LOS: 4 days   Brief hospital course:   Assessment & Plan: Regina Bernard is a 66 year old female with a history of COPD, long standing smoking history (at least 50 pack years) as well as previous history of drug and alcohol use, and liver cirrhosis (History of EtOH use and HCV infection) who presented with shortness of breath, cough, fatigue of 3 weeks.  In the ED, she was noted to be hypotensive with significant leukocytosis. She was also hypoxic and had increased work of breathing, prompting initiation of BiPAP, with improvement in respiratory status. She was also started on broad spectrum antibiotics and a CT scan of the chest was performed. She was started on nor-epinephrine peripherally and admitted to the ICU for further care.  Regina Bernard was transferred to hospitalist service on 06/30/22.  # Necrotizing pneumonia left upper lobe  --started on cefepime, flagyl and azithromycin --ID consulted today --d/c all and start Meropenem --She will need 4-6 weeks of antibiotic --Will give IV as long as leucocytosis persist --cont mucomyst neb BID  Advanced COPD with exacerbation --cont steroid as prednisone 40 mg daily --cont home Trelegy --cont mucomyst neb BID  #Acute Hypoxic and Hypercapnic Respiratory Failure --2/2 necrotizing Pneumonia and COPD Exacerbation Hypoxic on presentation with blood gas showing respiratory acidosis (7.2/65) that improved with BiPAP.  -nocturnal BiPAP and with worsening of respiratory status --Continue supplemental O2 to keep sats >=90%, wean as tolerated  #Septic Shock # Lactic acidosis In the setting of pneumonia, initially on nor-epinephrine for vasopressor support. Lactic acid was elevated indicating compromised perfusion. Liver cirrhosis likely contributed to decreased LA clearance.  --d/c'ed hydrocortisone     #Liver Cirrhosis HEPC  treated- undetectable VL   #History of UGIB Has a history of PUD and H. Pylori with previous GI bleed. Hemoglobin is stable --cont PPI BID   #AKI, ruled out --does not meet criteria   #Hyponatremia  Chronic back pain --worsened during hospitalization  --cont gabapentin at home dose of 300 mg TID --Oxycodone 5 mg TID PRN  #Anxiety #Depression -continue Duloxetine 30 mg daily -resume lamotrigine 100 mg daily  HTN --resume home Toprol now that BP is trending up    DVT prophylaxis: Lovenox SQ Code Status: Full code  Family Communication:  Level of care: Med-Surg Dispo:   The patient is from: home Anticipated d/c is to: home Anticipated d/c date is: undetermined, need IV abx until leukocytosis improves.    Subjective and Interval History:  Regina Bernard reported feeling better today.  ID consulted today.   Objective: Vitals:   07/02/22 0751 07/02/22 1701 07/02/22 1929 07/02/22 2014  BP: (!) 167/85 (!) 154/82  (!) 145/80  Pulse: 79 90  87  Resp: 20 20    Temp: 97.8 F (36.6 C) 98 F (36.7 C)  98.1 F (36.7 C)  TempSrc:      SpO2: 97% 97% 98% 100%  Weight:      Height:        Intake/Output Summary (Last 24 hours) at 07/02/2022 2234 Last data filed at 07/02/2022 1526 Gross per 24 hour  Intake 1029.6 ml  Output --  Net 1029.6 ml   Filed Weights   06/28/22 1643 06/29/22 0500 06/30/22 0500  Weight: 58.3 kg 57.4 kg 57.4 kg    Examination:   Constitutional: NAD, AAOx3 HEENT: conjunctivae and lids normal, EOMI CV: No cyanosis.   RESP: normal  respiratory effort, on 2L Neuro: II - XII grossly intact.   Psych: Normal mood and affect.  Appropriate judgement and reason   Data Reviewed: I have personally reviewed labs and imaging studies  Time spent: 35 minutes  Enzo Bi, MD [facility] If 7PM-7AM, please contact night-coverage 07/02/2022, 10:34 PM

## 2022-07-02 NOTE — Evaluation (Signed)
Physical Therapy Evaluation Patient Details Name: Regina Bernard MRN: [id number] DOB: 01-10-1956 Today's Date: 07/02/2022  History of Present Illness  Pt is a 66 y.o. female presenting to hospital 06/28/22 with c/o 2 weeks of SOB, productive cough, subjective fevers, and weakness.  Pt admitted with septic shock, lactic acidosis, acute hypoxic and hypercapnic respiratory failure, cavitary PNA (pending r/o TB), advanced COPD with exacerbation, and hyponatremia.   PMH includes smoking, severe COPD on chronic 3 L O2, remote breast CA, lumpectomy R, h/o gastric ulcer and upper GI bleed, liver cirrhosis.  Clinical Impression  Prior to hospital admission, pt was independent with ambulation; uses 3 L home O2 baseline; lives with her husband.  Currently pt is modified independent with bed mobility; SBA with transfers; and CGA ambulating 30 feet around bed (intermittent UE support on bed rails for support/balance).  Anticipate pt would benefit from use of walker or cane for ambulation.  Limited distance ambulating d/t generalized weakness and fatigue.  O2 sats 90% or greater with activity and 94% or greater at rest on 2 L O2 via nasal cannula; pt's HR 92 bpm at rest but increased up to 136 bpm with activity (nurse notified).  Pt demonstrating decreased activity tolerance and generalized weakness.  Pt would benefit from skilled PT to address noted impairments and functional limitations (see below for any additional details).  Upon hospital discharge, pt would benefit from [place] and support from family.    Recommendations for follow up therapy are one component of a multi-disciplinary discharge planning process, led by the attending physician.  Recommendations may be updated based on patient status, additional functional criteria and insurance authorization.  Follow Up Recommendations Home health PT      Assistance Recommended at Discharge Intermittent Supervision/Assistance  Patient can return home with the  following  A little help with walking and/or transfers;A little help with bathing/dressing/bathroom;Assistance with cooking/housework;Assist for transportation;Help with stairs or ramp for entrance    Equipment Recommendations Rolling walker (2 wheels) (pt has RW at home already)  Recommendations for Other Services  OT consult    Functional Status Assessment Patient has had a recent decline in their functional status and demonstrates the ability to make significant improvements in function in a reasonable and predictable amount of time.     Precautions / Restrictions Precautions Precautions: Fall Restrictions Weight Bearing Restrictions: No      Mobility  Bed Mobility Overal bed mobility: Modified Independent             General bed mobility comments: HOB elevated; mild increased effort/time to perform on own    Transfers Overall transfer level: Needs assistance Equipment used:  (holding onto siderail of bed) Transfers: Sit to/from Stand Sit to Stand: Supervision           General transfer comment: mild increased effort to stand; steady with single UE support on bed rail    Ambulation/Gait Ambulation/Gait assistance: Min guard Gait Distance (Feet): 30 Feet Assistive device:  (pt intermittently holding onto bed rail on bed)   Gait velocity: decreased     General Gait Details: decreased B LE step length/foot clearance; intermittent UE support on bedrails for balance  Stairs            Wheelchair Mobility    Modified Rankin (Stroke Patients Only)       Balance Overall balance assessment: Needs assistance Sitting-balance support: No upper extremity supported, Feet supported Sitting balance-Leahy Scale: Normal Sitting balance - Comments: steady sitting reaching outside BOS  Standing balance support: Single extremity supported, During functional activity Standing balance-Leahy Scale: Fair Standing balance comment: pt requiring intermittent UE  support on bed rails for balance when ambulating around bed                             Pertinent Vitals/Pain Pain Assessment Pain Assessment: Faces Faces Pain Scale: Hurts little more Pain Location: chronic low back pain Pain Descriptors / Indicators: Aching Pain Intervention(s): Limited activity within patient's tolerance, Monitored during session, Repositioned, Patient requesting pain meds-RN notified    Home Living Family/patient expects to be discharged to:: Private residence Living Arrangements: Spouse/significant other Available Help at Discharge: Family;Available PRN/intermittently Type of Home: House (Currently staying at her mother in [place] home (husband doing work on their home)) Home Access: Stairs to enter Entrance Stairs-Rails: Right Entrance Stairs-Number of Steps: [place]: One level [place]: Conservation officer, nature (2 wheels);Cane - single point;Rollator (4 wheels);Shower seat;BSC/3in1;Grab bars - toilet;Grab bars - tub/shower Additional Comments: 3 L home O2 use baseline    Prior Function Prior Level of Function : Independent/Modified Independent                     Hand Dominance        Extremity/Trunk Assessment   Upper Extremity Assessment Upper Extremity Assessment: Generalized weakness    Lower Extremity Assessment Lower Extremity Assessment: Generalized weakness    Cervical / Trunk Assessment Cervical / Trunk Assessment: Normal  Communication   Communication: No difficulties  Cognition Arousal/Alertness: Awake/alert Behavior During Therapy: WFL for tasks assessed/performed Overall Cognitive Status: Within Functional Limits for tasks assessed                                          General Comments  Nursing cleared pt for participation in physical therapy.  Pt agreeable to PT session.    Exercises     Assessment/Plan    PT Assessment Patient needs continued PT services  PT Problem List  Decreased strength;Decreased activity tolerance;Decreased balance;Decreased mobility;Decreased knowledge of use of DME;Cardiopulmonary status limiting activity       PT Treatment Interventions DME instruction;Gait training;Stair training;Functional mobility training;Therapeutic activities;Therapeutic exercise;Balance training;Patient/family education    PT Goals (Current goals can be found in the Care Plan section)  Acute Rehab PT Goals Patient Stated Goal: to improve overall strength and mobility PT Goal Formulation: With patient Time For Goal Achievement: 07/16/22 Potential to Achieve Goals: Fair    Frequency Min 2X/week     Co-evaluation               AM-PAC PT "6 Clicks" Mobility  Outcome Measure Help needed turning from your back to your side while in a flat bed without using bedrails?: None Help needed moving from lying on your back to sitting on the side of a flat bed without using bedrails?: None Help needed moving to and from a bed to a chair (including a wheelchair)?: A Little Help needed standing up from a chair using your arms (e.g., wheelchair or bedside chair)?: A Little Help needed to walk in hospital room?: A Little Help needed climbing 3-5 steps with a railing? : A Little 6 Click Score: 20    End of Session Equipment Utilized During Treatment: Gait belt;Oxygen (2 L via nasal cannula) Activity Tolerance: Patient limited by fatigue Patient  left: in bed;with call bell/phone within reach;with bed alarm set Nurse Communication: Mobility status;Precautions;Patient requests pain meds;Other (comment) (pt's HR during session) PT Visit Diagnosis: Unsteadiness on feet (R26.81);Muscle weakness (generalized) (M62.81);Other abnormalities of gait and mobility (R26.89)    Time: 5974-1638 PT Time Calculation (min) (ACUTE ONLY): 24 min   Charges:   PT Evaluation $PT Eval Low Complexity: 1 Low PT Treatments $Therapeutic Activity: 8-22 mins       Leitha Bleak,  PT 07/02/22, 1:38 PM

## 2022-07-03 DIAGNOSIS — J850 Gangrene and necrosis of lung: Secondary | ICD-10-CM

## 2022-07-03 DIAGNOSIS — K7469 Other cirrhosis of liver: Secondary | ICD-10-CM | POA: Diagnosis not present

## 2022-07-03 DIAGNOSIS — J441 Chronic obstructive pulmonary disease with (acute) exacerbation: Secondary | ICD-10-CM | POA: Diagnosis not present

## 2022-07-03 DIAGNOSIS — J9601 Acute respiratory failure with hypoxia: Secondary | ICD-10-CM | POA: Diagnosis not present

## 2022-07-03 HISTORY — DX: Gangrene and necrosis of lung: J85.0

## 2022-07-03 LAB — BASIC METABOLIC PANEL
Anion gap: 5 (ref 5–15)
BUN: 15 mg/dL (ref 8–23)
CO2: 28 mmol/L (ref 22–32)
Calcium: 8.3 mg/dL — ABNORMAL LOW (ref 8.9–10.3)
Chloride: 101 mmol/L (ref 98–111)
Creatinine, Ser: 0.82 mg/dL (ref 0.44–1.00)
GFR, Estimated: >60 mL/min (ref >60)
Glucose, Bld: 82 mg/dL (ref 70–99)
Potassium: 4 mmol/L (ref 3.5–5.1)
Sodium: 134 mmol/L — ABNORMAL LOW (ref 135–145)

## 2022-07-03 LAB — CULTURE, BLOOD (ROUTINE X 2)
Culture: NO GROWTH
Culture: NO GROWTH
Special Requests: ADEQUATE

## 2022-07-03 LAB — MAGNESIUM: Magnesium: 1.9 mg/dL (ref 1.7–2.4)

## 2022-07-03 LAB — CBC
HCT: 24.3 % — ABNORMAL LOW (ref 36.0–46.0)
Hemoglobin: 8.2 g/dL — ABNORMAL LOW (ref 12.0–15.0)
MCH: 31.5 pg (ref 26.0–34.0)
MCHC: 33.7 g/dL (ref 30.0–36.0)
MCV: 93.5 fL (ref 80.0–100.0)
Platelets: 284 10*3/uL (ref 150–400)
RBC: 2.6 MIL/uL — ABNORMAL LOW (ref 3.87–5.11)
RDW: 14.6 % (ref 11.5–15.5)
WBC: 15.3 10*3/uL — ABNORMAL HIGH (ref 4.0–10.5)
nRBC: 0 % (ref 0.0–0.2)

## 2022-07-03 LAB — GLUCOSE, CAPILLARY
Glucose-Capillary: 58 mg/dL — ABNORMAL LOW (ref 70–99)
Glucose-Capillary: 104 mg/dL — ABNORMAL HIGH (ref 70–99)
Glucose-Capillary: 104 mg/dL — ABNORMAL HIGH (ref 70–99)
Glucose-Capillary: 166 mg/dL — ABNORMAL HIGH (ref 70–99)
Glucose-Capillary: 165 mg/dL — ABNORMAL HIGH (ref 70–99)

## 2022-07-03 LAB — LEGIONELLA PNEUMOPHILA SEROGP 1 UR AG: L. pneumophila Serogp 1 Ur Ag: NEGATIVE

## 2022-07-03 LAB — HIV ANTIBODY (ROUTINE TESTING W REFLEX): HIV Screen 4th Generation wRfx: NONREACTIVE

## 2022-07-03 MED ORDER — HYDRALAZINE HCL 20 MG/ML IJ SOLN: 10.0000 mg | Freq: Four times a day (QID) | INTRAMUSCULAR | Status: DC | PRN

## 2022-07-03 MED ORDER — ADULT MULTIVITAMIN W/MINERALS CH
1.0000 | ORAL_TABLET | Freq: Every day | ORAL | Status: DC
  Administered 2022-07-03 – 2022-07-06 (×4): 1 via ORAL
  Filled 2022-07-03 (×5): qty 1

## 2022-07-03 MED ORDER — ENSURE ENLIVE PO LIQD
237.0000 mL | Freq: Three times a day (TID) | ORAL | Status: DC
  Administered 2022-07-04 – 2022-07-06 (×5): 237 mL via ORAL

## 2022-07-03 NOTE — Progress Notes (Signed)
Physical Therapy Treatment Patient Details Name: Regina Bernard MRN: [id number] DOB: 10-Jul-1955 Today's Date: 07/03/2022   History of Present Illness Pt is a 66 year old female admitted with Necrotizing pneumonia left upper lobe, COPD exacerbation, septic shock, PMH significant for COPD, long standing smoking history (at least 50 pack years) as well as previous history of drug and alcohol use, and liver cirrhosis (History of EtOH use and HCV infection)    PT Comments    Pt received upright in recliner agreeable to PT services. Resting on 3.5 L/min. Spo2 > 90% at rest. Pt performing 3x40' laps in room with seated rest b/t laps. With first lap pt required minguard, variable step lengths, R/L drifting, frequent UE support to maintain balance. On last two bouts pt provided RW completing step through gait, improved stability, only needing supervision. Education and VC's provided on safe use of RW with excellent carryover. SPO2 initially 88% post gait but maintaining > 90% with activity with last two bouts. Re-education provided on PLB. Education provided on decreased falls risk with AD and improved energy conservation. Pt understanding and in agreement. Pt in recliner with all needs in reach. D/c recs remain appropriate.    Recommendations for follow up therapy are one component of a multi-disciplinary discharge planning process, led by the attending physician.  Recommendations may be updated based on patient status, additional functional criteria and insurance authorization.  Follow Up Recommendations  Home health PT     Assistance Recommended at Discharge Intermittent Supervision/Assistance  Patient can return home with the following A little help with walking and/or transfers;A little help with bathing/dressing/bathroom;Assistance with cooking/housework;Assist for transportation;Help with stairs or ramp for entrance   Equipment Recommendations  None recommended by PT    Recommendations for  Other Services       Precautions / Restrictions Precautions Precautions: Fall Restrictions Weight Bearing Restrictions: No     Mobility  Bed Mobility               General bed mobility comments: NT. In recliner pre and post session Patient Response: Cooperative  Transfers Overall transfer level: Needs assistance Equipment used: None Transfers: Sit to/from Stand Sit to Stand: Supervision                Ambulation/Gait Ambulation/Gait assistance: Supervision, Min guard Gait Distance (Feet): 120 Feet (3x40' trials with seated rest between bouts.) Assistive device: None, Rolling walker (2 wheels) Gait Pattern/deviations: Step-through pattern, Step-to pattern, Drifts right/left, Staggering right, Staggering left       General Gait Details: Staggering gait R/L with frequent need for UE support on room features to stay standing. With RW pt ambulates with supervision and step through gait.   Stairs             Wheelchair Mobility    Modified Rankin (Stroke Patients Only)       Balance Overall balance assessment: Needs assistance Sitting-balance support: No upper extremity supported, Feet supported Sitting balance-Leahy Scale: Normal     Standing balance support: During functional activity, No upper extremity supported Standing balance-Leahy Scale: Fair Standing balance comment: frequent UE support needed on AD or bed features.                            Cognition Arousal/Alertness: Awake/alert Behavior During Therapy: WFL for tasks assessed/performed Overall Cognitive Status: Within Functional Limits for tasks assessed  General Comments: motivated        Exercises Other Exercises Other Exercises: use of AD to reduce falls risk and assist in energy conservation. PLB.    General Comments General comments (skin integrity, edema, etc.): SPO2 on 3.5 with gait 88-92%.      Pertinent  Vitals/Pain Pain Assessment Pain Assessment: No/denies pain    Home Living Family/patient expects to be discharged to:: Private residence Living Arrangements: Spouse/significant other Available Help at Discharge: Family;Available PRN/intermittently Type of Home: House Home Access: Stairs to enter Entrance Stairs-Rails: Right Entrance Stairs-Number of Steps: 3   Home Layout: One level Home Equipment: Conservation officer, nature (2 wheels);Cane - single point;Rollator (4 wheels);Shower seat;BSC/3in1;Grab bars - toilet;Grab bars - tub/shower      Prior Function            PT Goals (current goals can now be found in the care plan section) Acute Rehab PT Goals Patient Stated Goal: to improve overall strength and mobility PT Goal Formulation: With patient Time For Goal Achievement: 07/16/22 Potential to Achieve Goals: Fair Progress towards PT goals: Progressing toward goals    Frequency    Min 2X/week      PT Plan Current plan remains appropriate    Co-evaluation              AM-PAC PT "6 Clicks" Mobility   Outcome Measure  Help needed turning from your back to your side while in a flat bed without using bedrails?: None Help needed moving from lying on your back to sitting on the side of a flat bed without using bedrails?: None Help needed moving to and from a bed to a chair (including a wheelchair)?: A Little Help needed standing up from a chair using your arms (e.g., wheelchair or bedside chair)?: A Little Help needed to walk in hospital room?: A Little Help needed climbing 3-5 steps with a railing? : A Little 6 Click Score: 20    End of Session Equipment Utilized During Treatment: Gait belt;Oxygen Activity Tolerance: Patient tolerated treatment well Patient left: in chair;with call bell/phone within reach;with chair alarm set Nurse Communication: Mobility status PT Visit Diagnosis: Unsteadiness on feet (R26.81);Muscle weakness (generalized) (M62.81);Other abnormalities  of gait and mobility (R26.89)     Time: 2458-0998 PT Time Calculation (min) (ACUTE ONLY): 23 min  Charges:  $Gait Training: 23-37 mins                     Salem Caster. Fairly IV, PT, DPT Physical Therapist- [facility]  07/03/2022, 11:58 AM

## 2022-07-03 NOTE — Progress Notes (Signed)
CSW reached out to pt via phone do discuss recs for discharge.  Pt is amenable to [facility].  She has no preference.  Pt declined 3-in-1 states that she already has the DME at home.  [facility] to provide [facility] services at discharge.

## 2022-07-03 NOTE — Progress Notes (Signed)
Initial Nutrition Assessment  DOCUMENTATION CODES:   Non-severe (moderate) malnutrition in context of chronic illness  INTERVENTION:   -Ensure Enlive po TID, each supplement provides 350 kcal and 20 grams of protein -MVI with minerals daily -Magic cup TID with meals, each supplement provides 290 kcal and 9 grams of protein   NUTRITION DIAGNOSIS:   Moderate Malnutrition related to chronic illness (COPD) as evidenced by mild fat depletion, moderate fat depletion, mild muscle depletion, moderate muscle depletion.  GOAL:   Patient will meet greater than or equal to 90% of their needs  MONITOR:   PO intake, Supplement acceptance  REASON FOR ASSESSMENT:   Malnutrition Screening Tool    ASSESSMENT:   Pt with history of COPD and tobacco abuse as well as prior history of polysubstance abuse and underlying liver cirrhosis from alcohol as well as HCV.  Presented to the hospital with shortness of breath and failure to thrive as evidenced by significant weight loss unintentional.  Pt admitted with necrotizing pneumonia and COPD exacerbation.   Reviewed I/O's: +1 L x 24 hours and +6 L since admission   Spoke with pt at bedside, who reports feeling better today. She reports that she has experienced a general decline in health over the past year. Pt shares that she has had multiple hospitalizations and health concerns and has become weaker during each one. Her appetite is poor and she reports she liad in bed for 2 weeks PTA without eating anything. Pt reports she has been eating bites at meals, but "the food just doesn't taste right". Per pt, she underwent chemotherapy around 7 years ago, which affected her taste buds. Per pt, she does not like the taste of meat and diet consists mainly of ice cream, fruits, and vegetables.   Per pt, she has lost about 70# over the past year secondary to multiple illnesses, including back issues, back surgeries, and pneumonia. Reviewed wt hx; wt has been  stable over the past 8 months.   Discussed importance of good meal and supplement intake to promote healing. Pt amenable to supplements. Emotional support and reflective listening provided.   Medications reviewed and include prednisone.   Lab Results  Component Value Date   HGBA1C 5.4 01/15/2018   PTA DM medications are none.   Labs reviewed: Na: 134, CBGS: 104-166 (inpatient orders for glycemic control are 0-6 units insulin aspart TID with meals).    NUTRITION - FOCUSED PHYSICAL EXAM:  Flowsheet Row Most Recent Value  Orbital Region Mild depletion  Upper Arm Region Moderate depletion  Thoracic and Lumbar Region Mild depletion  Buccal Region Moderate depletion  Temple Region Moderate depletion  Clavicle Bone Region Moderate depletion  Clavicle and Acromion Bone Region Moderate depletion  Scapular Bone Region Moderate depletion  Dorsal Hand Moderate depletion  Patellar Region Moderate depletion  Anterior Thigh Region Moderate depletion  Posterior Calf Region Moderate depletion  Edema (RD Assessment) None  Hair Reviewed  Eyes Reviewed  Mouth Reviewed  Skin Reviewed  Nails Reviewed       Diet Order:   Diet Order             Diet regular Room service appropriate? Yes; Fluid consistency: Thin  Diet effective now                   EDUCATION NEEDS:   Education needs have been addressed  Skin:  Skin Assessment: Reviewed RN Assessment  Last BM:  07/03/22 (type 5)  Height:   Ht Readings from  Last 1 Encounters:  06/28/22 5' 3.5" (1.613 m)    Weight:   Wt Readings from Last 1 Encounters:  07/03/22 62.2 kg    Ideal Body Weight:  53.4 kg  BMI:  Body mass index is 23.91 kg/m.  Estimated Nutritional Needs:   Kcal:  1700-1900  Protein:  90-105 grams  Fluid:  > 1.7 L    Loistine Chance, RD, LDN, [place] Registered Dietitian II Certified Diabetes Care and Education Specialist Please refer to [facility] for RD and/or RD on-call/weekend/after hours pager

## 2022-07-03 NOTE — Progress Notes (Addendum)
[facility] PROGRESS NOTE  Regina Bernard ZOX:[id number] DOB: [birth year] DOA: 06/28/2022 PCP: Juluis Pitch, MD  Status Remains inpatient appropriate because: Continues to require nasal cannula oxygen as well as IV antibiotics for necrotizing pneumonia.  Given patient's presenting symptoms was also concern for underlying TB therefore the patient remains on airborne isolation.  AFB from sputum as well as BAL have been negative.  AFB culture pending.  Fungitell serum miscellaneous send out is still pending.   Level of care: Med-Surg  Code Status: Full Family Communication: Patient only DVT prophylaxis: Lovenox   HPI: 66 year old female patient with history of COPD and tobacco abuse as well as prior history of polysubstance abuse and underlying liver cirrhosis from alcohol as well as HCV.  Presented to the hospital with shortness of breath and failure to thrive as evidenced by significant weight loss unintentional.  At presentation she was hypotensive and met sepsis criteria.  She had increased work of breathing that improved after application of BiPAP.  CT of the chest revealed very extensive new consolidation of the left upper lobe and lingula with a large air and fluid containing cavity within the lingula measuring 7.9 x 5.8 cm consistent with cavitary infection.  Radiologist documented that mass relatively excluded given rapid development of this finding in comparison to recent CT chest from 05/15/22.  Patient was admitted to the ICU for further evaluation and treatment.  She had clinical signs of sepsis with shock and required pressor support.  AFB sputum and BAL were sent given concern for possible TB at presentation.  She eventually was transferred out of the ICU to the hospitalist service on 06/30/2022.  Subjective: Sitting up in the bed without any specific complaints.  States baseline shortness of breath and fatigue much worse than chronic at baseline but has improved since  initial presentation to the hospital.  Has abdominal discomfort, cough or difficulty eating.  Objective: Vitals:   07/02/22 2014 07/03/22 0652  BP: (!) 145/80 (!) 146/83  Pulse: 87 88  Resp:  16  Temp: 98.1 F (36.7 C) 98.4 F (36.9 C)  SpO2: 100% 100%    Intake/Output Summary (Last 24 hours) at 07/03/2022 0831 Last data filed at 07/02/2022 1526 Gross per 24 hour  Intake 1029.6 ml  Output --  Net 1029.6 ml   Filed Weights   06/29/22 0500 06/30/22 0500 07/03/22 0249  Weight: 57.4 kg 57.4 kg 62.2 kg    Exam:  Constitutional: NAD, calm, comfortable-looks older than stated age Respiratory: clear to auscultation bilaterally, no wheezing, no crackles. Normal respiratory effort. No accessory muscle use.  Notable increased work of breathing after transitioning from bed to bedside commode.  3.5 L/min Cardiovascular: Regular rate and rhythm, no murmurs / rubs / gallops. No extremity edema. 2+ pedal pulses. Abdomen: no tenderness, no masses palpated. Bowel sounds positive.  Musculoskeletal: no clubbing / cyanosis. No joint deformity upper and lower extremities. Good ROM, no contractures. Normal muscle tone.  Skin: no rashes, lesions, ulcers. No induration Neurologic: CN 2-12 grossly intact. Sensation intact, DTR normal. Strength 5/5 x all 4 extremities.  Psychiatric: Normal judgment and insight. Alert and oriented x 3. Normal mood.    Assessment/Plan:  Necrotizing pneumonia left upper lobe  --Cefepime, flagyl and azithromycin discontinued by ID in favor of meropenem for total of 4 to 6 weeks of antibiotics --Continue IV formulation until leukocytosis resolved -as of 12/27 WBC remains elevated at 15,300 --cont mucomyst neb BID --Per ID okay to discontinue airborne precautions since AFB negative  x 3   Advanced COPD with exacerbation --Plan O2 requirements 2 L/min prior to admission --cont prednisone 40 mg daily --cont home Breo Ellipta and Incruse Ellipta to replace home  Trelegy --cont mucomyst neb BID   Acute Hypoxic and Hypercapnic Respiratory Failure --2/2 necrotizing Pneumonia and COPD Exacerbation --Hypoxic on presentation with blood gas showing respiratory acidosis (7.2/65) that improved with BiPAP.  --Has not utilized nocturnal BiPAP since transfer out of ICU -- Baseline O2 requirements at home 2 L/min and as of today she has increased to 3.5 L/min and continues to have increased work of breathing with activity   Septic Shock --Sepsis physiology has resolved and hydrocortisone for stress dosing also discontinued    Liver Cirrhosis --HEPC treated- undetectable VL  --These are stable with no transaminitis and normal total bilirubin.   History of UGIB --Has a history of PUD and H. Pylori with previous GI bleed. Hemoglobin is stable --cont PPI BID --Avoid NSAIDs for pain   Hyponatremia -Secondary to cirrhosis and associated hypoalbuminemia-sodium currently stable   Chronic back pain --worsened during hospitalization  --cont gabapentin at home dose of 300 mg TID --Oxycodone 5 mg TID PRN   Anxiety/Depression -continue Duloxetine 30 mg daily -resume lamotrigine 100 mg daily   HTN -- Continue home Toprol     Data Reviewed: Basic Metabolic Panel: Recent Labs  Lab 06/29/22 0450 06/30/22 0454 07/01/22 0624 07/02/22 0755 07/03/22 0655  NA 131* 132* 136 135 134*  K 4.2 3.9 4.0 3.9 4.0  CL 98 102 105 103 101  CO2 _0 GLUCOSE 149* 105* 66* 68* 82  BUN 25* 25* 24* 17 15  CREATININE 1.05* 1.01* 0.90 0.86 0.82  CALCIUM 7.9* 7.9* 8.1* 8.2* 8.3*  MG 1.4* 2.1 1.7 1.6* 1.9  PHOS 6.2* 3.9  --   --   --    Liver Function Tests: Recent Labs  Lab 06/28/22 1242 07/02/22 0754  AST 48* 24  ALT 27 14  ALKPHOS 223* 162*  BILITOT 2.8* 1.0  PROT 7.1 5.7*  ALBUMIN 2.2* 1.8*   No results for input(s): "LIPASE", "AMYLASE" in the last 168 hours. No results for input(s): "AMMONIA" in the last 168 hours. CBC: Recent Labs  Lab  06/28/22 1242 06/29/22 0450 06/29/22 1445 06/30/22 0454 07/01/22 0624 07/02/22 0755 07/03/22 0655  WBC 47.2*   < > 29.7* 26.9* 22.0* 20.3* 15.3*  NEUTROABS 43.2*  --  28.4* 25.1*  --   --   --   HGB 9.9*   < > 8.0* 7.6* 8.3* 8.0* 8.2*  HCT 30.3*   < > 24.1* 22.8* 24.6* 23.8* 24.3*  MCV 97.4   < > 97.2 95.8 94.3 93.7 93.5  PLT 520*   < > 398 348 340 320 284   < > = values in this interval not displayed.   Cardiac Enzymes: No results for input(s): "CKTOTAL", "CKMB", "CKMBINDEX", "TROPONINI" in the last 168 hours. BNP (last 3 results) Recent Labs    10/28/21 1858  BNP 151.2*    ProBNP (last 3 results) No results for input(s): "PROBNP" in the last 8760 hours.  CBG: Recent Labs  Lab 07/02/22 0750 07/02/22 0835 07/02/22 1203 07/02/22 1657 07/02/22 2213  GLUCAP 65* 108* 109* 146* 220*    Recent Results (from the past 240 hour(s))  Urine Culture     Status: Abnormal   Collection Time: 06/28/22 10:05 AM   Specimen: Urine, Clean Catch  Result Value Ref Range Status   Specimen Description  Final    URINE, CLEAN CATCH Performed at [facility], [address] [place], [place] [address]    Special Requests   Final    NONE Performed at [facility], [address] [place], [place] [address]    Culture (A)  Final    40,000 COLONIES/mL ENTEROCOCCUS FAECIUM VANCOMYCIN RESISTANT ENTEROCOCCUS ISOLATED    Report Status 07/02/2022 FINAL  Final   Organism ID, Bacteria ENTEROCOCCUS FAECIUM (A)  Final      Susceptibility   Enterococcus faecium - MIC*    AMPICILLIN >=32 RESISTANT Resistant     NITROFURANTOIN 64 INTERMEDIATE Intermediate     VANCOMYCIN >=32 RESISTANT Resistant     GENTAMICIN SYNERGY SENSITIVE Sensitive     LINEZOLID 2 SENSITIVE Sensitive     * 40,000 COLONIES/mL ENTEROCOCCUS FAECIUM  Culture, blood (Routine x 2)     Status: None (Preliminary result)   Collection Time: 06/28/22 12:42 PM   Specimen: BLOOD  Result Value Ref Range Status    Specimen Description BLOOD  LEFT FOREARM  Final   Special Requests   Final    BOTTLES DRAWN AEROBIC AND ANAEROBIC Blood Culture adequate volume   Culture   Final    NO GROWTH 4 DAYS Performed at [facility], [address], [place], [place] [address]    Report Status PENDING  Incomplete  Culture, blood (Routine x 2)     Status: None (Preliminary result)   Collection Time: 06/28/22  3:16 PM   Specimen: BLOOD  Result Value Ref Range Status   Specimen Description BLOOD BLOOD LEFT ARM  Final   Special Requests   Final    BOTTLES DRAWN AEROBIC AND ANAEROBIC Blood Culture results may not be optimal due to an inadequate volume of blood received in culture bottles   Culture   Final    NO GROWTH 4 DAYS Performed at [facility], [address] [place], [place] [address]    Report Status PENDING  Incomplete  Culture, Respiratory w Gram Stain (tracheal aspirate)     Status: None   Collection Time: 06/28/22  3:16 PM   Specimen: SPU; Respiratory  Result Value Ref Range Status   Specimen Description   Final    SPUTUM Performed at [facility], [address] [place], [place] [address]    Special Requests   Final    SPUTUM Performed at [facility], [address] [place], [place] [address]    Gram Stain   Final    ABUNDANT WBC PRESENT, PREDOMINANTLY PMN ABUNDANT GRAM POSITIVE COCCI IN CHAINS FEW YEAST FEW GRAM NEGATIVE RODS Performed at [facility], [place] [address] [place], [place] [address]    Culture RARE PSEUDOMONAS AERUGINOSA  Final   Report Status 07/01/2022 FINAL  Final   Organism ID, Bacteria PSEUDOMONAS AERUGINOSA  Final      Susceptibility   Pseudomonas aeruginosa - MIC*    CEFTAZIDIME 2 SENSITIVE Sensitive     CIPROFLOXACIN <=0.25 SENSITIVE Sensitive     GENTAMICIN 2 SENSITIVE Sensitive     IMIPENEM 2 SENSITIVE Sensitive     PIP/TAZO <=4 SENSITIVE Sensitive     CEFEPIME 1 SENSITIVE Sensitive     * RARE PSEUDOMONAS  AERUGINOSA  MRSA Next Gen by PCR, Nasal     Status: None   Collection Time: 06/28/22  4:46 PM   Specimen: Nasal Mucosa; Nasal Swab  Result Value Ref Range Status   MRSA by PCR Next Gen NOT DETECTED  NOT DETECTED Final    Comment: (NOTE) The GeneXpert MRSA Assay (FDA approved for NASAL specimens only), is one component of a comprehensive MRSA colonization surveillance program. It is not intended to diagnose MRSA infection nor to guide or monitor treatment for MRSA infections. Test performance is not FDA approved in patients less than 64 years old. Performed at [facility], [place], [place] [address]   Acid Fast Smear (AFB)     Status: None   Collection Time: 06/29/22  1:25 AM  Result Value Ref Range Status   AFB Specimen Processing Concentration  Final   Acid Fast Smear Negative  Final    Comment: (NOTE) Performed At: [facility] [place], [place] [id number] Rush Cadle MD YY:[id number]    Source (AFB) EXPECTORATED SPUTUM  Corrected    Comment: Performed at [facility], [address] [place], [place] [address] CORRECTED ON 12/23 AT 0130: PREVIOUSLY REPORTED AS BDC   Expectorated Sputum Assessment w Gram Stain, Rflx to Resp Cult     Status: None   Collection Time: 06/29/22  1:25 AM   Specimen: Expectorated Sputum  Result Value Ref Range Status   Specimen Description EXPECTORATED SPUTUM  Final   Special Requests NONE  Final   Sputum evaluation   Final    THIS SPECIMEN IS ACCEPTABLE FOR SPUTUM CULTURE Performed at [facility], [address], [place], [place] [address]    Report Status 06/29/2022 FINAL  Final  Culture, Respiratory w Gram Stain     Status: None   Collection Time: 06/29/22  1:25 AM  Result Value Ref Range Status   Specimen Description   Final    EXPECTORATED SPUTUM Performed at [facility], [address] [place], [place] [address]    Special Requests   Final    NONE  Reflexed from [id number] Performed at [facility], [address] [place], [place] [address]    Gram Stain   Final    ABUNDANT WBC PRESENT, PREDOMINANTLY PMN MODERATE GRAM POSITIVE COCCI IN PAIRS IN CHAINS FEW GRAM NEGATIVE RODS FEW YEAST    Culture   Final    ABUNDANT Consistent with normal respiratory flora. No Pseudomonas species isolated Performed at [place] [address] [place], [place] [address]    Report Status 07/01/2022 FINAL  Final  Acid Fast Smear (AFB)     Status: None   Collection Time: 06/29/22 10:03 AM   Specimen: Expectorated Sputum  Result Value Ref Range Status   AFB Specimen Processing Concentration  Final   Acid Fast Smear Negative  Final    Comment: (NOTE) Performed At: [facility] [address] [place], [place] [id number] Rush Steuber MD [id number]    Source (AFB) BRONCHIAL ALVEOLAR LAVAGE  Final    Comment: Performed at [facility], [place], [place] [address]  Acid Fast Smear (AFB)     Status: None   Collection Time: 06/29/22  2:45 PM   Specimen: Expectorated Sputum  Result Value Ref Range Status   AFB Specimen Processing Concentration  Final   Acid Fast Smear Negative  Final    Comment: (NOTE) Performed At: [facility] [place], [place] [id number] Rush Vanacker MD [id number]    Source (AFB) SPUTUM  Final    Comment: Performed at [facility], [name] [place], [place] [address]  Respiratory (~20 pathogens) panel by PCR     Status: None  Collection Time: 06/29/22  5:11 PM   Specimen: Nasopharyngeal Swab; Respiratory  Result Value Ref Range Status   Adenovirus NOT DETECTED NOT DETECTED Final   Coronavirus 229E NOT DETECTED NOT DETECTED Final    Comment: (NOTE) The Coronavirus on the Respiratory Panel, DOES NOT test for the novel  Coronavirus (2019 nCoV)    Coronavirus HKU1 NOT DETECTED NOT DETECTED Final   Coronavirus NL63 NOT DETECTED  NOT DETECTED Final   Coronavirus OC43 NOT DETECTED NOT DETECTED Final   Metapneumovirus NOT DETECTED NOT DETECTED Final   Rhinovirus / Enterovirus NOT DETECTED NOT DETECTED Final   Influenza A NOT DETECTED NOT DETECTED Final   Influenza B NOT DETECTED NOT DETECTED Final   Parainfluenza Virus 1 NOT DETECTED NOT DETECTED Final   Parainfluenza Virus 2 NOT DETECTED NOT DETECTED Final   Parainfluenza Virus 3 NOT DETECTED NOT DETECTED Final   Parainfluenza Virus 4 NOT DETECTED NOT DETECTED Final   Respiratory Syncytial Virus NOT DETECTED NOT DETECTED Final   Bordetella pertussis NOT DETECTED NOT DETECTED Final   Bordetella Parapertussis NOT DETECTED NOT DETECTED Final   Chlamydophila pneumoniae NOT DETECTED NOT DETECTED Final   Mycoplasma pneumoniae NOT DETECTED NOT DETECTED Final    Comment: Performed at [facility] [place], [address] [place], [place] [address]  Resp panel by RT-PCR (RSV, Flu A&B, Covid) Anterior Nasal Swab     Status: None   Collection Time: 06/29/22  5:11 PM   Specimen: Anterior Nasal Swab  Result Value Ref Range Status   SARS Coronavirus 2 by RT PCR NEGATIVE NEGATIVE Final    Comment: (NOTE) SARS-CoV-2 target nucleic acids are NOT DETECTED.  The SARS-CoV-2 RNA is generally detectable in upper respiratory specimens during the acute phase of infection. The lowest concentration of SARS-CoV-2 viral copies this assay can detect is 138 copies/mL. A negative result does not preclude SARS-Cov-2 infection and should not be used as the sole basis for treatment or other patient management decisions. A negative result may occur with  improper specimen collection/handling, submission of specimen other than nasopharyngeal swab, presence of viral mutation(s) within the areas targeted by this assay, and inadequate number of viral copies(<138 copies/mL). A negative result must be combined with clinical observations, patient history, and epidemiological information. The expected  result is Negative.  Fact Sheet for Patients:  EntrepreneurPulse.com.au  Fact Sheet for Healthcare Providers:  IncredibleEmployment.be  This test is no t yet approved or cleared by the [place] FDA and  has been authorized for detection and/or diagnosis of SARS-CoV-2 by FDA under an Emergency Use Authorization (EUA). This EUA will remain  in effect (meaning this test can be used) for the duration of the COVID-19 declaration under Section 564(b)(1) of the Act, 21 U.S.C.section 360bbb-3(b)(1), unless the authorization is terminated  or revoked sooner.       Influenza A by PCR NEGATIVE NEGATIVE Final   Influenza B by PCR NEGATIVE NEGATIVE Final    Comment: (NOTE) The Xpert Xpress SARS-CoV-2/FLU/RSV plus assay is intended as an aid in the diagnosis of influenza from Nasopharyngeal swab specimens and should not be used as a sole basis for treatment. Nasal washings and aspirates are unacceptable for Xpert Xpress SARS-CoV-2/FLU/RSV testing.  Fact Sheet for Patients: EntrepreneurPulse.com.au  Fact Sheet for Healthcare Providers: IncredibleEmployment.be  This test is not yet approved or cleared by the [place] FDA and has been authorized for detection and/or diagnosis of SARS-CoV-2 by FDA under an Emergency Use Authorization (EUA). This EUA will remain  in effect (meaning this test can be used) for the duration of the COVID-19 declaration under Section 564(b)(1) of the Act, 21 U.S.C. section 360bbb-3(b)(1), unless the authorization is terminated or revoked.     Resp Syncytial Virus by PCR NEGATIVE NEGATIVE Final    Comment: (NOTE) Fact Sheet for Patients: EntrepreneurPulse.com.au  Fact Sheet for Healthcare Providers: IncredibleEmployment.be  This test is not yet approved or cleared by the [place] FDA and has been authorized for detection and/or diagnosis of  SARS-CoV-2 by FDA under an Emergency Use Authorization (EUA). This EUA will remain in effect (meaning this test can be used) for the duration of the COVID-19 declaration under Section 564(b)(1) of the Act, 21 U.S.C. section 360bbb-3(b)(1), unless the authorization is terminated or revoked.  Performed at [facility], [address], [place], [place] [address]      Studies: No results found.  Scheduled Meds:  acetylcysteine  4 mL Nebulization BID   DULoxetine  30 mg Oral Daily   enoxaparin (LOVENOX) injection  40 mg Subcutaneous Q24H   fluticasone furoate-vilanterol  1 puff Inhalation Daily   And   umeclidinium bromide  1 puff Inhalation Daily   gabapentin  300 mg Oral TID   insulin aspart  0-6 Units Subcutaneous TID WC   ipratropium-albuterol  3 mL Nebulization BID   lamoTRIgine  100 mg Oral Daily   lidocaine  1 patch Transdermal Q24H   metoprolol succinate  100 mg Oral Daily   pantoprazole (PROTONIX) IV  40 mg Intravenous Q12H   predniSONE  40 mg Oral Q breakfast   Continuous Infusions:  meropenem (MERREM) IV 1 g (07/03/22 0600)    Principal Problem:   Acute hypoxic respiratory failure ([place]) Active Problems:   COPD with acute exacerbation (H[facility])   Liver cirrhosis ([place])   Aspiration pneumonia ([place])   Septic shock ([place])   AKI (acute kidney injury) ([place])   Hyponatremia   Consultants: Critical care medicine Infectious disease service  Procedures: None  Antibiotics: Flagyl 12/22 through 12/25 Cefepime 12/22 through 12/26 Flagyl 12/22 through 12/26 IV vancomycin x 1 dose 12/22  Meropenem 12/26 >>>   Time spent: 35 minutes    Erin Hearing ANP  [facility] 7 am - 330 pm/M-F for direct patient care and secure chat Please refer to Amion for contact info 5  days

## 2022-07-03 NOTE — Progress Notes (Signed)
Date of Admission:  06/28/2022      ID: Regina Bernard is a 66 y.o. female  Principal Problem:   Acute hypoxic respiratory failure ([place]) Active Problems:   COPD with acute exacerbation (HCC)   Liver cirrhosis (HCC)   Aspiration pneumonia (HCC)   Septic shock (HCC)   AKI (acute kidney injury) ([place])   Hyponatremia    Subjective: Feeling better Cough better Sob much better Says blood sugar high  Medications:   acetylcysteine  4 mL Nebulization BID   DULoxetine  30 mg Oral Daily   enoxaparin (LOVENOX) injection  40 mg Subcutaneous Q24H   fluticasone furoate-vilanterol  1 puff Inhalation Daily   And   umeclidinium bromide  1 puff Inhalation Daily   gabapentin  300 mg Oral TID   insulin aspart  0-6 Units Subcutaneous TID WC   ipratropium-albuterol  3 mL Nebulization BID   lamoTRIgine  100 mg Oral Daily   lidocaine  1 patch Transdermal Q24H   metoprolol succinate  100 mg Oral Daily   pantoprazole (PROTONIX) IV  40 mg Intravenous Q12H   predniSONE  40 mg Oral Q breakfast    Objective: Vital signs in last 24 hours: Temp:  [97.8 F (36.6 C)-98.4 F (36.9 C)] 97.8 F (36.6 C) (12/27 0859) Pulse Rate:  [87-90] 88 (12/27 0859) Resp:  [15-20] 15 (12/27 0859) BP: (145-181)/(80-95) 181/95 (12/27 0859) SpO2:  [94 %-100 %] 94 % (12/27 0859) Weight:  [62.2 kg] 62.2 kg (12/27 0249)    PHYSICAL EXAM:  General: Alert, cooperative, no distress, chronically ill Head: Normocephalic, without obvious abnormality, atraumatic. Eyes: Conjunctivae clear, anicteric sclerae. Pupils are equal ENT Nares normal. No drainage or sinus tenderness. Lips, mucosa, and tongue normal. No Thrush Neck: Supple, symmetrical, no adenopathy, thyroid: non tender no carotid bruit and no JVD. Back: No CVA tenderness. Lungs: b/l air entry- crepts left side Heart: Regular rate and rhythm, no murmur, rub or gallop. Abdomen: Soft, non-tender,not distended. Bowel sounds normal. No masses Extremities:  atraumatic, no cyanosis. No edema. No clubbing Skin: No rashes or lesions. Or bruising Lymph: Cervical, supraclavicular normal. Neurologic: Grossly non-focal  Lab Results Recent Labs    07/02/22 0755 07/03/22 0655  WBC 20.3* 15.3*  HGB 8.0* 8.2*  HCT 23.8* 24.3*  NA 135 134*  K 3.9 4.0  CL 103 101  CO2 25 28  BUN 17 15  CREATININE 0.86 0.82   Liver Panel Recent Labs    07/02/22 0754  PROT 5.7*  ALBUMIN 1.8*  AST 24  ALT 14  ALKPHOS 162*  BILITOT 1.0  BILIDIR 0.3*  IBILI 0.7    Microbiology:  Studies/Results: No results found.   Assessment/Plan:   Expand All Collapse All  NAME: Regina Bernard  DOB: [birth year]  MRN: [id number]  Date/Time: 07/02/2022 12:58 PM   REQUESTING PROVIDER: Huston Foley Subjective:  REASON FOR CONSULT: Necrotizing pneumonia  ? Regina Bernard is a 66 y.o. with a history of COPD, smoker, h/o breast cancer , anemia, GI bleed, vertebral compression fracture presents with not feeling well for more than 2 weeks. She had fever and night sweats, cough , for more than 2 weeks She had contacted her pulmonologist and was prescribed azithromycin and prednisone on 06/17/22, ( which she did not get it immediately as it was sent to a mail order pharmacy)was repeated twice after that.  In the ED vitals   06/28/22  BP 101/81  Temp 97.8 F (36.6 C)  Pulse Rate 72  Resp 22 !  SpO2 100 %  O2 Flow Rate (L/min) 5 L/min  Weight 128 lb 8.5 oz      Latest Reference Range & Units 06/28/22  WBC 4.0 - 10.5 K/uL 47.2 (H)  Hemoglobin 12.0 - 15.0 g/dL 9.9 (L)  HCT 36.0 - 46.0 % 30.3 (L)  Platelets 150 - 400 K/uL 520 (H)  Creatinine 0.44 - 1.00 mg/dL 1.02 (H)  CTA chest showed New, very extensive consolidation of the left upper lobe and lingula, with a large air and fluid containing cavitary component within the lingula measuring at least 7.9 x 5.8 cm. Underlying bronchiectasis Was initially admitted to ICU- was started on  vanco/cefepime/flagyl/azithromycin Sputum culture from 06/28/22 pseudomonas MRSA nares neg AFB X 3 sputums sent pending smear result Seen by pulmonary and work up for fungal infections have been sent I am asked to see the patient for the necrotizing pneumonia         Past Medical History:  Diagnosis Date   Abnormal CT scan, chest     Anxiety     Asthma     Breast cancer ([place])      Breast CA- Right    Breast cancer ([place]) 06/18/2011    right breast cancer   COPD (chronic obstructive pulmonary disease) ([place])     Endometriosis      tx with vaginal hysterectomy   Fatty liver     Hepatitis C 1998    [facility] trial treatment in-patient study 2005. HCV 740, 05/20/11.   History of substance abuse ([place])      Heroine, cocaine, marijuana. States all of them. Heavy use 1995 to 2000. Occasional use before and after. None since 2006.    Hot flashes related to aromatase inhibitor therapy     Hypertension     Lung mass     Peripheral neuropathy      in hands and feet         Past Surgical History:  Procedure Laterality Date   BREAST LUMPECTOMY Right 2013   COLONOSCOPY WITH PROPOFOL N/A 03/20/2022    Procedure: COLONOSCOPY WITH PROPOFOL;  Surgeon: [name], MD;  Location: [facility] ENDOSCOPY;  Service: Gastroenterology;  Laterality: N/A;   ESOPHAGOGASTRODUODENOSCOPY (EGD) WITH PROPOFOL N/A 10/29/2021    Procedure: ESOPHAGOGASTRODUODENOSCOPY (EGD) WITH PROPOFOL;  Surgeon: [name], MD;  Location[facility] END[facility]COPY;  Service: Gastroenterology;  Laterality: N/A;   ESOPHAGOGASTRODUODENOSCOPY (EGD) WITH PROPOFOL N/A 02/14/2022    Procedure: ESOPHAGOGASTRODUODENOSCOPY (EGD) WITH PROPOFOL;  Surgeon: [name], MD;  Location: [facility] ENDOSCOPY;  Service: Gastroenterology;  Laterality: N/A;  DRIVER 5 - Robinson   Upper right leg benign tumor removed       VAGINAL HYSTERECTOMY   2001    Social History         Socioeconomic History   Marital status:  Married      Spouse name: Not on file   Number of children: Not on file   Years of education: Not on file   Highest education level: Not on file  Occupational History   Not on file  Tobacco Use   Smoking status: Former      Types: E-cigarettes      Quit date: 09/2020      Years since quitting: 1.8   Smokeless tobacco: Never   Tobacco comments:      last cigeratte use 6 days ago (11/30/15)  Vaping Use   Vaping Use: Every day  Substance and Sexual Activity   Alcohol use: Yes      Comment: occ   Drug use: No   Sexual activity: Never  Other Topics Concern   Not on file  Social History Narrative   Not on file    Social Determinants of Health    Financial Resource Strain: Not on file  Food Insecurity: Not on file  Transportation Needs: Not on file  Physical Activity: Not on file  Stress: Not on file  Social Connections: Not on file  Intimate Partner Violence: Not on file         Family History  Problem Relation Age of Onset   Diabetes Maternal Grandmother     Lung cancer Father     Lung cancer Mother     Hypertension Mother     Heart disease Mother     Skin cancer Mother     Breast cancer Neg Hx           Allergies  Allergen Reactions   Penicillins Swelling and Rash      Has patient had a PCN reaction causing immediate rash, facial/tongue/throat swelling, SOB or lightheadedness with hypotension: Yes Has patient had a PCN reaction causing severe rash involving mucus membranes or skin necrosis: No Has patient had a PCN reaction that required hospitalization: No Has patient had a PCN reaction occurring within the last 10 years: No If all of the above answers are "NO", then may proceed with Cephalosporin use.      I?          Current Facility-Administered Medications  Medication Dose Route Frequency Provider Last Rate Last Admin   acetylcysteine (MUCOMYST) 20 % nebulizer / oral solution 4 mL  4 mL Nebulization BID Enzo Bi, MD   4 mL at 07/02/22 0823   albuterol  (PROVENTIL) (2.5 MG/3ML) 0.083% nebulizer solution 2.5 mg  2.5 mg Nebulization Q6H PRN Armando Reichert, MD   2.5 mg at 07/01/22 0825   azithromycin (ZITHROMAX) 500 mg in sodium chloride 0.9 % 250 mL IVPB  500 mg Intravenous Q24H Oswald Hillock, RPH 250 mL/hr at 07/01/22 1448 500 mg at 07/01/22 1448   ceFEPIme (MAXIPIME) 2 g in sodium chloride 0.9 % 100 mL IVPB  2 g Intravenous Q12H Dgayli, Berdine Addison, MD 200 mL/hr at 07/02/22 1059 Infusion Verify at 07/02/22 1059   docusate sodium (COLACE) capsule 100 mg  100 mg Oral BID PRN Armando Reichert, MD       DULoxetine (CYMBALTA) DR capsule 30 mg  30 mg Oral Daily Dgayli, Khabib, MD   30 mg at 07/02/22 1039   enoxaparin (LOVENOX) injection 40 mg  40 mg Subcutaneous Q24H Dgayli, Berdine Addison, MD   40 mg at 07/01/22 2337   fluticasone furoate-vilanterol (BREO ELLIPTA) 100-25 MCG/ACT 1 puff  1 puff Inhalation Daily Enzo Bi, MD   1 puff at 07/02/22 1039    And   umeclidinium bromide (INCRUSE ELLIPTA) 62.5 MCG/ACT 1 puff  1 puff Inhalation Daily Enzo Bi, MD   1 puff at 07/02/22 1038   gabapentin (NEURONTIN) capsule 300 mg  300 mg Oral TID Enzo Bi, MD   300 mg at 07/02/22 1039   insulin aspart (novoLOG) injection 0-6 Units  0-6 Units Subcutaneous TID WC Dgayli, Berdine Addison, MD   1 Units at 07/01/22 1700   ipratropium-albuterol (DUONEB) 0.5-2.5 (3) MG/3ML nebulizer solution 3 mL  3 mL Nebulization BID Enzo Bi, MD   3 mL at 07/02/22 0822   lidocaine (LIDODERM) 5 %  1 patch  1 patch Transdermal Q24H Foust, Katy L, NP   1 patch at 07/02/22 8850   magnesium sulfate IVPB 2 g 50 mL  2 g Intravenous Once Enzo Bi, MD 50 mL/hr at 07/02/22 1231 2 g at 07/02/22 1231   metroNIDAZOLE (FLAGYL) IVPB 500 mg  500 mg Intravenous Q8H Dgayli, Berdine Addison, MD 100 mL/hr at 07/02/22 1059 Infusion Verify at 07/02/22 1059   Oral care mouth rinse  15 mL Mouth Rinse PRN Armando Reichert, MD       oxyCODONE (Oxy IR/ROXICODONE) immediate release tablet 5 mg  5 mg Oral TID PRN Enzo Bi, MD   5 mg at  07/02/22 1051   pantoprazole (PROTONIX) injection 40 mg  40 mg Intravenous Q12H Armando Reichert, MD   40 mg at 07/02/22 1039   polyethylene glycol (MIRALAX / GLYCOLAX) packet 17 g  17 g Oral Daily PRN Armando Reichert, MD       predniSONE (DELTASONE) tablet 40 mg  40 mg Oral Q breakfast Enzo Bi, MD   40 mg at 07/02/22 1039      Abtx:  Anti-infectives (From admission, onward)        Start     Dose/Rate Route Frequency Ordered Stop    06/29/22 0100   ceFEPIme (MAXIPIME) 2 g in sodium chloride 0.9 % 100 mL IVPB        2 g 200 mL/hr over 30 Minutes Intravenous Every 12 hours 06/28/22 1542      06/28/22 1600   metroNIDAZOLE (FLAGYL) IVPB 500 mg        500 mg 100 mL/hr over 60 Minutes Intravenous Every 8 hours 06/28/22 1548      06/28/22 1500   azithromycin (ZITHROMAX) 500 mg in sodium chloride 0.9 % 250 mL IVPB        500 mg 250 mL/hr over 60 Minutes Intravenous Every 24 hours 06/28/22 1450 07/03/22 1459    06/28/22 1330   vancomycin (VANCOREADY) IVPB 1250 mg/250 mL        1,250 mg 166.7 mL/hr over 90 Minutes Intravenous  Once 06/28/22 1319 06/28/22 1619    06/28/22 1230   ceFEPIme (MAXIPIME) 2 g in sodium chloride 0.9 % 100 mL IVPB        2 g 200 mL/hr over 30 Minutes Intravenous  Once 06/28/22 1222 06/28/22 1343    06/28/22 1230   metroNIDAZOLE (FLAGYL) IVPB 500 mg  Status:  Discontinued        500 mg 100 mL/hr over 60 Minutes Intravenous  Once 06/28/22 1222 06/28/22 1231            REVIEW OF SYSTEMS:  Const:fever,  chills,  weight loss of 70 pounds she says Eyes: negative diplopia or visual changes, negative eye pain ENT: negative coryza, negative sore throat Resp:  cough, , dyspnea, blood stained sputum Cards: negative for chest pain, palpitations, lower extremity edema GU: negative for frequency, dysuria and hematuria GI: Negative for abdominal pain, diarrhea, bleeding, constipation Has poor appetite Skin: negative for rash and pruritus Heme: negative for easy bruising and  gum/nose bleeding MS: fatigue Back pain for months- underwent kyphoplasty Neurolo:negative for headaches, dizziness, vertigo, memory problems  Psych: negative for feelings of anxiety, depression  Endocrine: negative for thyroid, diabetes Allergy/Immunology- penicillin Smoker until 3 weeks - 1 month ago Drinks alcohol Objective:  VITALS:  BP (!) 167/85 (BP Location: Left Arm)   Pulse 79   Temp 97.8 F (36.6 C)   Resp 20   Ht  5' 3.5" (1.613 m)   Wt 57.4 kg   SpO2 97%   BMI 22.06 kg/m    PHYSICAL EXAM:  General: Alert, cooperative, chronically ill Head: Normocephalic, without obvious abnormality, atraumatic. Eyes: Conjunctivae clear, anicteric sclerae. Pupils are equal ENT Nares normal. No drainage or sinus tenderness. Lips, mucosa, and tongue normal. No Thrush Dentition fair Neck: Supple, symmetrical, no adenopathy, thyroid: non tender no carotid bruit and no JVD. Back: No CVA tenderness. Lungs:b/l air entry Crepts left side rhonchi Heart: s1s2 Abdomen: Soft, non-tender,not distended. Bowel sounds normal. No masses Extremities: atraumatic, no cyanosis. No edema. No clubbing Skin: No rashes or lesions. Or bruising Lymph: Cervical, supraclavicular normal. Neurologic: Grossly non-focal Pertinent Labs Lab Results CBC Labs (Brief)          Component Value Date/Time    WBC 20.3 (H) 07/02/2022 0755    RBC 2.54 (L) 07/02/2022 0755    HGB 8.0 (L) 07/02/2022 0755    HGB 14.5 10/12/2014 1239    HCT 23.8 (L) 07/02/2022 0755    HCT 43.6 10/12/2014 1239    PLT 320 07/02/2022 0755    PLT 248 10/12/2014 1239    MCV 93.7 07/02/2022 0755    MCV 104 (H) 10/12/2014 1239    MCH 31.5 07/02/2022 0755    MCHC 33.6 07/02/2022 0755    RDW 14.5 07/02/2022 0755    RDW 14.3 10/12/2014 1239    LYMPHSABS 0.5 (L) 06/30/2022 0454    LYMPHSABS 1.2 10/12/2014 1239    MONOABS 1.0 06/30/2022 0454    MONOABS 0.6 10/12/2014 1239    EOSABS 0.0 06/30/2022 0454    EOSABS 0.2 10/12/2014 1239     BASOSABS 0.1 06/30/2022 0454    BASOSABS 0.0 10/12/2014 1239            Latest Ref Rng & Units 07/02/2022    7:55 AM 07/01/2022    6:24 AM 06/30/2022    4:54 AM  CMP  Glucose 70 - 99 mg/dL 68  66  105   BUN 8 - 23 mg/dL '17  24  25   '$ Creatinine 0.44 - 1.00 mg/dL 0.86  0.90  1.01   Sodium 135 - 145 mmol/L 135  136  132   Potassium 3.5 - 5.1 mmol/L 3.9  4.0  3.9   Chloride 98 - 111 mmol/L 103  105  102   CO2 22 - 32 mmol/L '25  25  24   '$ Calcium 8.9 - 10.3 mg/dL 8.2  8.1  7.9           Microbiology:        Recent Results (from the past 240 hour(s))  Urine Culture     Status: Abnormal    Collection Time: 06/28/22 10:05 AM    Specimen: Urine, Clean Catch  Result Value Ref Range Status    Specimen Description     Final      URINE, CLEAN CATCH Performed at [facility], [address] [place], [place] [address]      Special Requests     Final      NONE Performed at [facility], [address] [place], [place] [address]      Culture (A)   Final      40,000 COLONIES/mL ENTEROCOCCUS FAECIUM VANCOMYCIN RESISTANT ENTEROCOCCUS ISOLATED      Report Status 07/02/2022 FINAL   Final    Organism ID, Bacteria ENTEROCOCCUS FAECIUM (A)   Final      Susceptibility  Enterococcus faecium - MIC*      AMPICILLIN >=32 RESISTANT Resistant        NITROFURANTOIN 64 INTERMEDIATE Intermediate        VANCOMYCIN >=32 RESISTANT Resistant        GENTAMICIN SYNERGY SENSITIVE Sensitive        LINEZOLID 2 SENSITIVE Sensitive       * 40,000 COLONIES/mL ENTEROCOCCUS FAECIUM  Culture, blood (Routine x 2)     Status: None (Preliminary result)    Collection Time: 06/28/22 12:42 PM    Specimen: BLOOD  Result Value Ref Range Status    Specimen Description BLOOD  LEFT FOREARM   Final    Special Requests     Final      BOTTLES DRAWN AEROBIC AND ANAEROBIC Blood Culture adequate volume    Culture     Final      NO GROWTH 4 DAYS Performed at [facility], [address], [place], [place] [address]      Report Status PENDING   Incomplete  Culture, blood (Routine x 2)     Status: None (Preliminary result)    Collection Time: 06/28/22  3:16 PM    Specimen: BLOOD  Result Value Ref Range Status    Specimen Description BLOOD BLOOD LEFT ARM   Final    Special Requests     Final      BOTTLES DRAWN AEROBIC AND ANAEROBIC Blood Culture results may not be optimal due to an inadequate volume of blood received in culture bottles    Culture     Final      NO GROWTH 4 DAYS Performed at [facility], [address], [place], [place] [address]      Report Status PENDING   Incomplete  Culture, Respiratory w Gram Stain (tracheal aspirate)     Status: None    Collection Time: 06/28/22  3:16 PM    Specimen: SPU; Respiratory  Result Value Ref Range Status    Specimen Description     Final      SPUTUM Performed at [facility], [address], [place], [place] [address]      Special Requests     Final      SPUTUM Performed at [facility], [address] [place], [place] [address]      Gram Stain     Final      ABUNDANT WBC PRESENT, PREDOMINANTLY PMN ABUNDANT GRAM POSITIVE COCCI IN CHAINS FEW YEAST FEW GRAM NEGATIVE RODS Performed at [facility], [place] [address], [place],  [address]      Culture RARE PSEUDOMONAS AERUGINOSA   Final    Report Status 07/01/2022 FINAL   Final    Organism ID, Bacteria PSEUDOMONAS AERUGINOSA   Final      Susceptibility    Pseudomonas aeruginosa - MIC*      CEFTAZIDIME 2 SENSITIVE Sensitive        CIPROFLOXACIN <=0.25 SENSITIVE Sensitive        GENTAMICIN 2 SENSITIVE Sensitive        IMIPENEM 2 SENSITIVE Sensitive        PIP/TAZO <=4 SENSITIVE Sensitive        CEFEPIME 1 SENSITIVE Sensitive       * RARE PSEUDOMONAS AERUGINOSA  MRSA Next Gen by PCR, Nasal     Status: None    Collection Time: 06/28/22  4:46 PM    Specimen: Nasal Mucosa; Nasal Swab  Result Value Ref Range Status  MRSA by  PCR Next Gen NOT DETECTED NOT DETECTED Final      Comment: (NOTE) The GeneXpert MRSA Assay (FDA approved for NASAL specimens only), is one component of a comprehensive MRSA colonization surveillance program. It is not intended to diagnose MRSA infection nor to guide or monitor treatment for MRSA infections. Test performance is not FDA approved in patients less than 68 years old. Performed at [facility], [address] [place], [place] [address]    Expectorated Sputum Assessment w Gram Stain, Rflx to Resp Cult     Status: None    Collection Time: 06/29/22  1:25 AM    Specimen: Expectorated Sputum  Result Value Ref Range Status    Specimen Description EXPECTORATED SPUTUM   Final    Special Requests NONE   Final    Sputum evaluation     Final      THIS SPECIMEN IS ACCEPTABLE FOR SPUTUM CULTURE Performed at [facility], [address], [place], [place] [address]      Report Status 06/29/2022 FINAL   Final  Culture, Respiratory w Gram Stain     Status: None    Collection Time: 06/29/22  1:25 AM  Result Value Ref Range Status    Specimen Description     Final      EXPECTORATED SPUTUM Performed at [facility], [address], [place], [place] [address]      Special Requests     Final      NONE Reflexed from [id number] Performed at [facility], [address] [place], [place] [address]      Gram Stain     Final      ABUNDANT WBC PRESENT, PREDOMINANTLY PMN MODERATE GRAM POSITIVE COCCI IN PAIRS IN CHAINS FEW GRAM NEGATIVE RODS FEW YEAST      Culture     Final      ABUNDANT Consistent with normal respiratory flora. No Pseudomonas species isolated Performed at [facility] [address], [place], [place] [address]      Report Status 07/01/2022 FINAL   Final  Respiratory (~20 pathogens) panel by PCR     Status: None    Collection Time: 06/29/22  5:11 PM    Specimen: Nasopharyngeal Swab; Respiratory  Result Value Ref Range Status     Adenovirus NOT DETECTED NOT DETECTED Final    Coronavirus 229E NOT DETECTED NOT DETECTED Final      Comment: (NOTE) The Coronavirus on the Respiratory Panel, DOES NOT test for the novel  Coronavirus (2019 nCoV)      Coronavirus HKU1 NOT DETECTED NOT DETECTED Final    Coronavirus NL63 NOT DETECTED NOT DETECTED Final    Coronavirus OC43 NOT DETECTED NOT DETECTED Final    Metapneumovirus NOT DETECTED NOT DETECTED Final    Rhinovirus / Enterovirus NOT DETECTED NOT DETECTED Final    Influenza A NOT DETECTED NOT DETECTED Final    Influenza B NOT DETECTED NOT DETECTED Final    Parainfluenza Virus 1 NOT DETECTED NOT DETECTED Final    Parainfluenza Virus 2 NOT DETECTED NOT DETECTED Final    Parainfluenza Virus 3 NOT DETECTED NOT DETECTED Final    Parainfluenza Virus 4 NOT DETECTED NOT DETECTED Final    Respiratory Syncytial Virus NOT DETECTED NOT DETECTED Final    Bordetella pertussis NOT DETECTED NOT DETECTED Final    Bordetella Parapertussis NOT DETECTED NOT DETECTED Final    Chlamydophila pneumoniae NOT DETECTED NOT DETECTED Final    Mycoplasma  pneumoniae NOT DETECTED NOT DETECTED Final      Comment: Performed at [facility], [place] [address] [place], [place] [address]  Resp panel by RT-PCR (RSV, Flu A&B, Covid) Anterior Nasal Swab     Status: None    Collection Time: 06/29/22  5:11 PM    Specimen: Anterior Nasal Swab  Result Value Ref Range Status    SARS Coronavirus 2 by RT PCR NEGATIVE NEGATIVE Final      Comment: (NOTE) SARS-CoV-2 target nucleic acids are NOT DETECTED.   The SARS-CoV-2 RNA is generally detectable in upper respiratory specimens during the acute phase of infection. The lowest concentration of SARS-CoV-2 viral copies this assay can detect is 138 copies/mL. A negative result does not preclude SARS-Cov-2 infection and should not be used as the sole basis for treatment or other patient management decisions. A negative result may occur with  improper specimen  collection/handling, submission of specimen other than nasopharyngeal swab, presence of viral mutation(s) within the areas targeted by this assay, and inadequate number of viral copies(<138 copies/mL). A negative result must be combined with clinical observations, patient history, and epidemiological information. The expected result is Negative.   Fact Sheet for Patients:  EntrepreneurPulse.com.au   Fact Sheet for Healthcare Providers:  IncredibleEmployment.be   This test is no t yet approved or cleared by the [place] FDA and  has been authorized for detection and/or diagnosis of SARS-CoV-2 by FDA under an Emergency Use Authorization (EUA). This EUA will remain  in effect (meaning this test can be used) for the duration of the COVID-19 declaration under Section 564(b)(1) of the Act, 21 U.S.C.section 360bbb-3(b)(1), unless the authorization is terminated  or revoked sooner.           Influenza A by PCR NEGATIVE NEGATIVE Final    Influenza B by PCR NEGATIVE NEGATIVE Final      Comment: (NOTE) The Xpert Xpress SARS-CoV-2/FLU/RSV plus assay is intended as an aid in the diagnosis of influenza from Nasopharyngeal swab specimens and should not be used as a sole basis for treatment. Nasal washings and aspirates are unacceptable for Xpert Xpress SARS-CoV-2/FLU/RSV testing.   Fact Sheet for Patients: EntrepreneurPulse.com.au   Fact Sheet for Healthcare Providers: IncredibleEmployment.be   This test is not yet approved or cleared by the [place] FDA and has been authorized for detection and/or diagnosis of SARS-CoV-2 by FDA under an Emergency Use Authorization (EUA). This EUA will remain in effect (meaning this test can be used) for the duration of the COVID-19 declaration under Section 564(b)(1) of the Act, 21 U.S.C. section 360bbb-3(b)(1), unless the authorization is terminated or revoked.         Resp Syncytial Virus by PCR NEGATIVE NEGATIVE Final      Comment: (NOTE) Fact Sheet for Patients: EntrepreneurPulse.com.au   Fact Sheet for Healthcare Providers: IncredibleEmployment.be   This test is not yet approved or cleared by the [place] FDA and has been authorized for detection and/or diagnosis of SARS-CoV-2 by FDA under an Emergency Use Authorization (EUA). This EUA will remain in effect (meaning this test can be used) for the duration of the COVID-19 declaration under Section 564(b)(1) of the Act, 21 U.S.C. section 360bbb-3(b)(1), unless the authorization is terminated or revoked.   Performed at [facility], [address] [place], [place] [address]        IMAGING RESULTS:  I have personally reviewed the films ?Dec 2023  NOV 2023                     Extensive left upper lobe and lingula consolidation with air /fluid cavity   Impression/Recommendation 66 yr female with h/o COPD, smoker, underlying emphysema presents with cough, fever, sob, fatigue of 3 weeks?   Necrotizing pneumonia left upper lobe with leukemoid reaction, pseudomonas in culture Afb negative X 3- can DC airborne Underlying COPD/emphysema cefepime, vanco, flagyl discontinued and switched to meropenem She will need 4-6 weeks of antibiotic Will give IV as long as leucocytosis persist and can then go on Cipro and flagyl?   COPD with acute hypoxic resp failure - on prednisone '40mg'$ - would consider quick taper because of hyperglycemia Anemia    H/o GI ulcer    HEPC treated- undetectable VL Cirrhosis   H/o Compression fracture of vertebrae - s/p kyphoplasty   H/o ca breast s/p lumpectomy - was on aromatase inhibitor but stopped because of SI- has missed her appt with oncology this year Need to make sure no pulmonary mets Discussed the management with th patient in detail. Discussed with care team

## 2022-07-03 NOTE — Evaluation (Signed)
Occupational Therapy Evaluation Patient Details Name: Regina Bernard MRN: [id number] DOB: [birth year] Today's Date: 07/03/2022   History of Present Illness Pt is a 66 year old female admitted with Necrotizing pneumonia left upper lobe, COPD exacerbation, septic shock, PMH significant for COPD, long standing smoking history (at least 50 pack years) as well as previous history of drug and alcohol use, and liver cirrhosis (History of EtOH use and HCV infection)   Clinical Impression   Chart reviewed, RN cleared pt for participation in OT evacuation. Pt is alert and oriented x4, good awareness of current deficits however reports feeling anxious throughout. Of note, pt with low blood sugar this morning, rechecked prior to mobility with it reading at 104. PTA pt reports MOD I in ADL/IADL however endorses she has started to "crash" after bouts of activity the last three months. Amb with no AD. Pt presents with deficits in activity tolerance, endurance, pulmonary status and strength affecting safe and optimal ADL completion. Provided extensive education re: energy conservation techniques during ADL task completion. Recommend HHOT to address functional deficits, carry over of energy conservation techniques. OT will continue to follow acutely.      Recommendations for follow up therapy are one component of a multi-disciplinary discharge planning process, led by the attending physician.  Recommendations may be updated based on patient status, additional functional criteria and insurance authorization.   Follow Up Recommendations  Home health OT     Assistance Recommended at Discharge Intermittent Supervision/Assistance  Patient can return home with the following A little help with bathing/dressing/bathroom    Functional Status Assessment  Patient has had a recent decline in their functional status and demonstrates the ability to make significant improvements in function in a reasonable and predictable  amount of time.  Equipment Recommendations  BSC/3in1    Recommendations for Other Services       Precautions / Restrictions Precautions Precautions: Fall Restrictions Weight Bearing Restrictions: No      Mobility Bed Mobility Overal bed mobility: Modified Independent                  Transfers Overall transfer level: Needs assistance Equipment used: None Transfers: Sit to/from Stand Sit to Stand: Supervision                  Balance Overall balance assessment: Needs assistance Sitting-balance support: No upper extremity supported, Feet supported Sitting balance-Leahy Scale: Normal     Standing balance support: During functional activity, No upper extremity supported Standing balance-Leahy Scale: Fair                             ADL either performed or assessed with clinical judgement   ADL Overall ADL's : Needs assistance/impaired                                       General ADL Comments: supervision for grooming tasks in sitting, vcs for energy conservation techniques throughout, simulated short amb toilet transfer with supervison; anticipate SETUP-supervision for dressing tasks     Vision Patient Visual Report: No change from baseline       Perception     Praxis      Pertinent Vitals/Pain Pain Assessment Pain Assessment: Faces Faces Pain Scale: Hurts a little bit Pain Location: generalized, lower back Pain Descriptors / Indicators: Aching, Discomfort Pain Intervention(s): Limited activity within  patient's tolerance, Monitored during session, Repositioned     Hand Dominance     Extremity/Trunk Assessment Upper Extremity Assessment Upper Extremity Assessment: Overall WFL for tasks assessed   Lower Extremity Assessment Lower Extremity Assessment: Generalized weakness   Cervical / Trunk Assessment Cervical / Trunk Assessment: Normal   Communication Communication Communication: No difficulties    Cognition Arousal/Alertness: Awake/alert Behavior During Therapy: WFL for tasks assessed/performed, Anxious Overall Cognitive Status: Within Functional Limits for tasks assessed                                       General Comments  spo2 >95% on 3 L via [place]    Exercises Other Exercises Other Exercises: edu pt re: role of OT, role of rehab, energy conservation techniques, home safety, falls prevention   Shoulder Instructions      Home Living Family/patient expects to be discharged to:: Private residence Living Arrangements: Spouse/significant other Available Help at Discharge: Family;Available PRN/intermittently Type of Home: House Home Access: Stairs to enter [employer] of Steps: 3 Entrance Stairs-Rails: Right Home Layout: One level     Bathroom Shower/Tub: Teacher, early years/pre: Standard     Home Equipment: Conservation officer, nature (2 wheels);Cane - single point;Rollator (4 wheels);Shower seat;BSC/3in1;Grab bars - toilet;Grab bars - tub/shower          Prior Functioning/Environment Prior Level of Function : Independent/Modified Independent             Mobility Comments: amb with no AD ADLs Comments: reports having lots of energy then "crashing" after performing ADL/IADL tasks        OT Problem List: Decreased strength;Decreased activity tolerance;Cardiopulmonary status limiting activity      OT Treatment/Interventions: Self-care/ADL training;Patient/family education;Therapeutic exercise;Energy conservation;Therapeutic activities;DME and/or AE instruction    OT Goals(Current goals can be found in the care plan section) Acute Rehab OT Goals Patient Stated Goal: get stronger OT Goal Formulation: With patient Time For Goal Achievement: 07/17/22 Potential to Achieve Goals: Good ADL Goals Pt Will Perform Grooming: with modified independence;standing;sitting Pt Will Perform Lower Body Dressing: with modified independence;sit  to/from stand Pt Will Transfer to Toilet: with modified independence;ambulating Pt Will Perform Toileting - Clothing Manipulation and hygiene: with modified independence;sit to/from stand  OT Frequency: Min 2X/week    Co-evaluation              AM-PAC OT "6 Clicks" Daily Activity     Outcome Measure Help from another person eating meals?: None Help from another person taking care of personal grooming?: None Help from another person toileting, which includes using toliet, bedpan, or urinal?: A Little Help from another person bathing (including washing, rinsing, drying)?: A Little Help from another person to put on and taking off regular upper body clothing?: None Help from another person to put on and taking off regular lower body clothing?: None 6 Click Score: 22   End of Session Equipment Utilized During Treatment: Oxygen Nurse Communication: Mobility status  Activity Tolerance: Patient tolerated treatment well Patient left: in chair;with call bell/phone within reach;with chair alarm set  OT Visit Diagnosis: Muscle weakness (generalized) (M62.81)                Time: 3810-1751 OT Time Calculation (min): 25 min Charges:  OT General Charges $OT Visit: 1 Visit OT Evaluation $OT Eval Moderate Complexity: 1 Mod  Shanon Payor, OTD OTR/L  07/03/22, 11:20 AM

## 2022-07-04 DIAGNOSIS — J441 Chronic obstructive pulmonary disease with (acute) exacerbation: Secondary | ICD-10-CM | POA: Diagnosis not present

## 2022-07-04 DIAGNOSIS — J9601 Acute respiratory failure with hypoxia: Secondary | ICD-10-CM | POA: Diagnosis not present

## 2022-07-04 DIAGNOSIS — Z87891 Personal history of nicotine dependence: Secondary | ICD-10-CM | POA: Diagnosis not present

## 2022-07-04 DIAGNOSIS — J850 Gangrene and necrosis of lung: Secondary | ICD-10-CM | POA: Diagnosis not present

## 2022-07-04 DIAGNOSIS — E440 Moderate protein-calorie malnutrition: Secondary | ICD-10-CM | POA: Insufficient documentation

## 2022-07-04 LAB — BASIC METABOLIC PANEL
Anion gap: 4 — ABNORMAL LOW (ref 5–15)
BUN: 19 mg/dL (ref 8–23)
CO2: 29 mmol/L (ref 22–32)
Calcium: 8.1 mg/dL — ABNORMAL LOW (ref 8.9–10.3)
Chloride: 99 mmol/L (ref 98–111)
Creatinine, Ser: 0.84 mg/dL (ref 0.44–1.00)
GFR, Estimated: >60 mL/min (ref >60)
Glucose, Bld: 89 mg/dL (ref 70–99)
Potassium: 4.3 mmol/L (ref 3.5–5.1)
Sodium: 132 mmol/L — ABNORMAL LOW (ref 135–145)

## 2022-07-04 LAB — MISC LABCORP TEST (SEND OUT): Labcorp test code: 832599

## 2022-07-04 LAB — CBC
HCT: 26.1 % — ABNORMAL LOW (ref 36.0–46.0)
Hemoglobin: 8.6 g/dL — ABNORMAL LOW (ref 12.0–15.0)
MCH: 30.8 pg (ref 26.0–34.0)
MCHC: 33 g/dL (ref 30.0–36.0)
MCV: 93.5 fL (ref 80.0–100.0)
Platelets: 311 10*3/uL (ref 150–400)
RBC: 2.79 MIL/uL — ABNORMAL LOW (ref 3.87–5.11)
RDW: 14.6 % (ref 11.5–15.5)
WBC: 14.1 10*3/uL — ABNORMAL HIGH (ref 4.0–10.5)
nRBC: 0 % (ref 0.0–0.2)

## 2022-07-04 LAB — GLUCOSE, CAPILLARY
Glucose-Capillary: 64 mg/dL — ABNORMAL LOW (ref 70–99)
Glucose-Capillary: 85 mg/dL (ref 70–99)
Glucose-Capillary: 92 mg/dL (ref 70–99)
Glucose-Capillary: 192 mg/dL — ABNORMAL HIGH (ref 70–99)
Glucose-Capillary: 183 mg/dL — ABNORMAL HIGH (ref 70–99)

## 2022-07-04 LAB — MAGNESIUM: Magnesium: 1.6 mg/dL — ABNORMAL LOW (ref 1.7–2.4)

## 2022-07-04 MED ORDER — MAGNESIUM SULFATE 2 GM/50ML IV SOLN
2.0000 g | Freq: Once | INTRAVENOUS | Status: AC
  Administered 2022-07-04: 2 g via INTRAVENOUS
  Filled 2022-07-04: qty 50

## 2022-07-04 MED ORDER — SACCHAROMYCES BOULARDII 250 MG PO CAPS
250.0000 mg | ORAL_CAPSULE | Freq: Two times a day (BID) | ORAL | Status: DC
  Administered 2022-07-04 – 2022-07-06 (×5): 250 mg via ORAL
  Filled 2022-07-04 (×5): qty 1

## 2022-07-04 MED ORDER — PREDNISONE 20 MG PO TABS
20.0000 mg | ORAL_TABLET | Freq: Every day | ORAL | Status: DC
  Administered 2022-07-04 – 2022-07-06 (×3): 20 mg via ORAL
  Filled 2022-07-04 (×3): qty 1

## 2022-07-04 MED ORDER — PREDNISONE 20 MG PO TABS: 20.0000 mg | ORAL_TABLET | Freq: Every day | ORAL | Status: DC

## 2022-07-04 NOTE — Progress Notes (Addendum)
Date of Admission:  06/28/2022      ID: Regina Bernard is a 66 y.o. female Principal Problem:   Acute hypoxic respiratory failure ([place]) Active Problems:   COPD with acute exacerbation ([facility])   Liver cirrhosis ([facility])   Aspiration pneumonia ([facility])   Septic shock (H[facility])   AKI (acute kidney injury) ([place])   Hyponatremia   Necrotizing pneumonia (H[facility])   Malnutrition of moderate degree    Subjective: Pt is doing better  Says coughing less But still has left sided chest pain  Medications:   DULoxetine  30 mg Oral Daily   enoxaparin (LOVENOX) injection  40 mg Subcutaneous Q24H   feeding supplement  237 mL Oral TID BM   fluticasone furoate-vilanterol  1 puff Inhalation Daily   And   umeclidinium bromide  1 puff Inhalation Daily   gabapentin  300 mg Oral TID   insulin aspart  0-6 Units Subcutaneous TID WC   ipratropium-albuterol  3 mL Nebulization BID   lamoTRIgine  100 mg Oral Daily   lidocaine  1 patch Transdermal Q24H   metoprolol succinate  100 mg Oral Daily   multivitamin with minerals  1 tablet Oral Daily   pantoprazole (PROTONIX) IV  40 mg Intravenous Q12H   predniSONE  20 mg Oral Q breakfast   saccharomyces boulardii  250 mg Oral BID    Objective: Vital signs in last 24 hours: Temp:  [97.6 F (36.4 C)-98.5 F (36.9 C)] 97.6 F (36.4 C) (12/28 1700) Pulse Rate:  [62-75] 68 (12/28 1700) Resp:  [17-20] 18 (12/28 1700) BP: (130-180)/(81-92) 145/89 (12/28 1700) SpO2:  [91 %-100 %] 99 % (12/28 1700) Weight:  [61.7 kg-63.6 kg] 61.7 kg (12/28 0500)    PHYSICAL EXAM:  General: Alert, cooperative, no distress, appears stated age.  Head: Normocephalic, without obvious abnormality, atraumatic. Eyes: Conjunctivae clear, anicteric sclerae. Pupils are equal ENT Nares normal. No drainage or sinus tenderness. Lips, mucosa, and tongue normal. No Thrush Neck: Supple, symmetrical, no adenopathy, thyroid: non tender no carotid bruit and no JVD. Back: No CVA tenderness. Lungs:  crepts left side Heart: Regular rate and rhythm, no murmur, rub or gallop. Abdomen: Soft, non-tender,not distended. Bowel sounds normal. No masses Extremities: atraumatic, no cyanosis. No edema. No clubbing Skin: No rashes or lesions. Or bruising Lymph: Cervical, supraclavicular normal. Neurologic: Grossly non-focal  Lab Results Recent Labs    07/03/22 0655 07/04/22 0431  WBC 15.3* 14.1*  HGB 8.2* 8.6*  HCT 24.3* 26.1*  NA 134* 132*  K 4.0 4.3  CL 101 99  CO2 28 29  BUN 15 19  CREATININE 0.82 0.84   Liver Panel Recent Labs    07/02/22 0754  PROT 5.7*  ALBUMIN 1.8*  AST 24  ALT 14  ALKPHOS 162*  BILITOT 1.0  BILIDIR 0.3*  IBILI 0.7  Microbiology: Pseudomonas sputum culture [employer] X 3 neg   Assessment/Plan:  Necrotizing pneumonia of the left upper lung and lingula Could have aspirated Also has pseudomonas Currently on meropenem Pt has PCN allergy- says she had a  rash when she was in her teens. She is not sure whether she has taken amoxicillin ( I do see a prescription for augmentin given in sept 2022 when she was hospitalized at [place] ) Need to check with her pharmacy whether it was given ) If she has taken it before then we can do Augmention+ cipro on discharge  COPD/Emphysema  H/o compression fracture of vertebrae s/p kyphoplasty   Discussed the  management with the patient

## 2022-07-04 NOTE — Progress Notes (Signed)
Mobility Specialist - Progress Note  During mobility:  SpO2(97)     07/04/22 1007  Mobility  Activity Stood at bedside;Ambulated with assistance in room;Dangled on edge of bed  Level of Assistance Contact guard assist, steadying assist  Assistive Device Front wheel walker  Distance Ambulated (ft) 3 ft  Activity Response Tolerated well  Mobility Referral Yes  $Mobility charge 1 Mobility   Pt resting in bed on 3L upon entry.  Pt expressed feeling fatigue,and weakness but was willing to STS 3x with RW and side stepped to [place]. Pt declined to ambulate to the chair. Pt retured to bed and left with needs in reach. RN Notified.   Loma Sender Mobility Specialist 07/04/22, 10:17 AM

## 2022-07-04 NOTE — Care Management Important Message (Signed)
Important Message  Patient Details  Name: Regina Bernard MRN: [id number] Date of Birth: 06-25-1956   Medicare Important Message Given:  Yes     Juliann Pulse A Storie Heffern 07/04/2022, 10:52 AM

## 2022-07-04 NOTE — Progress Notes (Signed)
PROGRESS NOTE  Regina Bernard VQX:[id number] DOB: [birth year] DOA: 06/28/2022 PCP: Juluis Pitch, MD   LOS: 6 days   Brief Narrative / Interim history: 66 year old female patient with history of COPD and tobacco abuse as well as prior history of polysubstance abuse and underlying liver cirrhosis from alcohol as well as HCV.  Presented to the hospital with shortness of breath and failure to thrive as evidenced by significant weight loss unintentional.  At presentation she was hypotensive and met sepsis criteria.  She had increased work of breathing that improved after application of BiPAP.  CT of the chest revealed very extensive new consolidation of the left upper lobe and lingula with a large air and fluid containing cavity within the lingula measuring 7.9 x 5.8 cm consistent with cavitary infection.  Radiologist documented that mass relatively excluded given rapid development of this finding in comparison to recent CT chest from 05/15/22.  Patient was admitted to the ICU for further evaluation and treatment.  She had clinical signs of sepsis with shock and required pressor support.  AFB sputum and BAL were sent given concern for possible TB at presentation.  She eventually was transferred out of the ICU to the hospitalist service on 06/30/2022.   Subjective / 24h Interval events: She is overall doing much better.  Assesement and Plan: Principal Problem:   Acute hypoxic respiratory failure (HCC) Active Problems:   Liver cirrhosis (HCC)   COPD with acute exacerbation (HCC)   Aspiration pneumonia (HCC)   Septic shock (HCC)   AKI (acute kidney injury) ([place])   Hyponatremia   Necrotizing pneumonia (HCC)   Malnutrition of moderate degree   Principal problem Septic shock due to necrotizing pneumonia left upper lobe -patient initially required admission to the ICU for vasopressors.  She was placed on broad-spectrum antibiotics.  ID consulted.  Currently she is on meropenem, with plans to  continue IV antibiotics until leukocytosis improves, and then transition to oral per ID.  Initially she was on airborne precautions, but this was discontinued since AFB is negative x 3.  Active problems Advanced COPD with exacerbation, chronic hypoxic respiratory failure -she is on 2 L at home.  Has been placed on steroids, continue to wean, decrease dose to 20 mg today.  Continue inhalers and nebulizers   Liver Cirrhosis - HCV treated- undetectable VL. Stable with no transaminitis and normal total bilirubin.   History of UGIB - Has a history of PUD and H. Pylori with previous GI bleed. Hemoglobin is stable   Hyponatremia -Secondary to cirrhosis and associated hypoalbuminemia-sodium currently stable   Chronic back pain -worsened during hospitalization, continue gabapentin,  Anxiety/Depression -continue Duloxetine, Lamictal   HTN - Continue home Toprol   Scheduled Meds:  acetylcysteine  4 mL Nebulization BID   DULoxetine  30 mg Oral Daily   enoxaparin (LOVENOX) injection  40 mg Subcutaneous Q24H   feeding supplement  237 mL Oral TID BM   fluticasone furoate-vilanterol  1 puff Inhalation Daily   And   umeclidinium bromide  1 puff Inhalation Daily   gabapentin  300 mg Oral TID   insulin aspart  0-6 Units Subcutaneous TID WC   ipratropium-albuterol  3 mL Nebulization BID   lamoTRIgine  100 mg Oral Daily   lidocaine  1 patch Transdermal Q24H   metoprolol succinate  100 mg Oral Daily   multivitamin with minerals  1 tablet Oral Daily   pantoprazole (PROTONIX) IV  40 mg Intravenous Q12H   predniSONE  20 mg Oral  Q breakfast   saccharomyces boulardii  250 mg Oral BID   Continuous Infusions:  meropenem (MERREM) IV 1 g (07/04/22 0523)   PRN Meds:.albuterol, docusate sodium, hydrALAZINE, mouth rinse, oxyCODONE, polyethylene glycol  Current Outpatient Medications  Medication Instructions   albuterol (PROVENTIL) 2.5 mg, Nebulization, Every 6 hours PRN   diazepam (VALIUM) 2 MG tablet  SMARTSIG:1 Tablet(s) By Mouth   DULoxetine (CYMBALTA) 30 mg, Oral, 2 times daily   gabapentin (NEURONTIN) 300 mg, Oral, 3 times daily   HYDROcodone-acetaminophen (NORCO/VICODIN) 5-325 MG tablet 1 tablet, 2 times daily PRN   lamoTRIgine (LAMICTAL) 100 mg, Oral, Daily   metoprolol succinate (TOPROL-XL) 100 mg, Oral, Daily, Take with or immediately following a meal.    Multiple Vitamin (MULTI-VITAMINS) TABS 1 tablet, Oral, Daily   nicotine (NICODERM CQ - DOSED IN MG/24 HOURS) 21 mg, Transdermal, Daily   omeprazole (PRILOSEC) 40 mg, Oral, Every morning   OXYGEN 2 L, Inhalation, at bedtime and repeat x1 PRN   QUEtiapine (SEROQUEL) 25 mg, Oral, At bedtime PRN   SENEXON-S 8.6-50 MG tablet 2 tablets, Oral, 2 times daily   TRELEGY ELLIPTA 100-62.5-25 MCG/INH AEPB 1 puff, Inhalation, Daily    Diet Orders (From admission, onward)     Start     Ordered   06/30/22 0550  Diet regular Room service appropriate? Yes; Fluid consistency: Thin  Diet effective now       Question Answer Comment  Room service appropriate? Yes   Fluid consistency: Thin      06/30/22 0549            DVT prophylaxis: enoxaparin (LOVENOX) injection 40 mg Start: 06/28/22 2200 SCDs Start: 06/28/22 1506   Lab Results  Component Value Date   PLT 311 07/04/2022      Code Status: Full Code  Family Communication: no family at bedside   Status is: Inpatient  Remains inpatient appropriate because: leukocytosis   Level of care: Med-Surg  Consultants:  ID PCCM  Objective: Vitals:   07/03/22 2300 07/04/22 0500 07/04/22 0525 07/04/22 0806  BP:   (!) 162/87 (!) 175/91  Pulse:   62 63  Resp:   18 18  Temp:   98 F (36.7 C) 97.7 F (36.5 C)  TempSrc:   Oral   SpO2:   91% 97%  Weight: 63.6 kg 61.7 kg    Height:        Intake/Output Summary (Last 24 hours) at 07/04/2022 1107 Last data filed at 07/04/2022 0400 Gross per 24 hour  Intake 300 ml  Output --  Net 300 ml   Wt Readings from Last 3 Encounters:   07/04/22 61.7 kg  03/20/22 56.2 kg  02/14/22 55.9 kg    Examination:  Constitutional: NAD Eyes: no scleral icterus ENMT: Mucous membranes are moist.  Neck: normal, supple Respiratory: Overall distant breath, faint end expiratory wheezing present Cardiovascular: Regular rate and rhythm, no murmurs / rubs / gallops. No LE edema.  Abdomen: non distended, no tenderness. Bowel sounds positive.  Musculoskeletal: no clubbing / cyanosis.  Skin: no rashes Neurologic: non focal   Data Reviewed: I have independently reviewed following labs and imaging studies   CBC Recent Labs  Lab 06/28/22 1242 06/29/22 0450 06/29/22 1445 06/30/22 0454 07/01/22 0624 07/02/22 0755 07/03/22 0655 07/04/22 0431  WBC 47.2*   < > 29.7* 26.9* 22.0* 20.3* 15.3* 14.1*  HGB 9.9*   < > 8.0* 7.6* 8.3* 8.0* 8.2* 8.6*  HCT 30.3*   < > 24.1* 22.8* 24.6*  23.8* 24.3* 26.1*  PLT 520*   < > 398 348 340 320 284 311  MCV 97.4   < > 97.2 95.8 94.3 93.7 93.5 93.5  MCH 31.8   < > 32.3 31.9 31.8 31.5 31.5 30.8  MCHC 32.7   < > 33.2 33.3 33.7 33.6 33.7 33.0  RDW 13.9   < > 14.2 14.2 14.2 14.5 14.6 14.6  LYMPHSABS 0.9  --  0.3* 0.5*  --   --   --   --   MONOABS 1.5*  --  0.6 1.0  --   --   --   --   EOSABS 0.1  --  0.1 0.0  --   --   --   --   BASOSABS 0.1  --  0.1 0.1  --   --   --   --    < > = values in this interval not displayed.    Recent Labs  Lab 06/28/22 1242 06/28/22 1516 06/28/22 2132 06/29/22 0450 06/30/22 0454 07/01/22 0624 07/02/22 0754 07/02/22 0755 07/03/22 0655 07/04/22 0431  NA 129*  --   --    < > 132* 136  --  135 134* 132*  K 3.9  --   --    < > 3.9 4.0  --  3.9 4.0 4.3  CL 93*  --   --    < > 102 105  --  103 101 99  CO2 24  --   --    < > 24 25  --  _0 GLUCOSE 124*  --   --    < > 105* 66*  --  68* 82 89  BUN 20  --   --    < > 25* 24*  --  _1 CREATININE 1.02*  --   --    < > 1.01* 0.90  --  0.86 0.82 0.84  CALCIUM 8.4*  --   --    < > 7.9* 8.1*  --  8.2* 8.3* 8.1*   AST 48*  --   --   --   --   --  24  --   --   --   ALT 27  --   --   --   --   --  14  --   --   --   ALKPHOS 223*  --   --   --   --   --  162*  --   --   --   BILITOT 2.8*  --   --   --   --   --  1.0  --   --   --   ALBUMIN 2.2*  --   --   --   --   --  1.8*  --   --   --   MG  --   --   --    < > 2.1 1.7  --  1.6* 1.9 1.6*  DDIMER  --   --  6.40*  --   --   --   --   --   --   --   PROCALCITON  --  1.12  --   --   --   --   --   --   --   --   LATICACIDVEN 5.5* 4.1* 1.7  --   --   --   --   --   --   --  INR 1.1  --   --   --   --   --   --   --   --   --    < > = values in this interval not displayed.    ------------------------------------------------------------------------------------------------------------------ No results for input(s): "CHOL", "HDL", "LDLCALC", "TRIG", "CHOLHDL", "LDLDIRECT" in the last 72 hours.  Lab Results  Component Value Date   HGBA1C 5.4 01/15/2018   ------------------------------------------------------------------------------------------------------------------ No results for input(s): "TSH", "T4TOTAL", "T3FREE", "THYROIDAB" in the last 72 hours.  Invalid input(s): "FREET3"  Cardiac Enzymes No results for input(s): "CKMB", "TROPONINI", "MYOGLOBIN" in the last 168 hours.  Invalid input(s): "CK" ------------------------------------------------------------------------------------------------------------------    Component Value Date/Time   BNP 151.2 (H) 10/28/2021 1858    CBG: Recent Labs  Lab 07/03/22 1153 07/03/22 1557 07/03/22 2104 07/04/22 0809 07/04/22 0859  GLUCAP 104* 166* 165* 64* 85    Recent Results (from the past 240 hour(s))  Urine Culture     Status: Abnormal   Collection Time: 06/28/22 10:05 AM   Specimen: Urine, Clean Catch  Result Value Ref Range Status   Specimen Description   Final    URINE, CLEAN CATCH Performed at [facility], [address] [place], [place] [address]    Special Requests    Final    NONE Performed at [facility], [address] [place], [place] [address]    Culture (A)  Final    40,000 COLONIES/mL ENTEROCOCCUS FAECIUM VANCOMYCIN RESISTANT ENTEROCOCCUS ISOLATED    Report Status 07/02/2022 FINAL  Final   Organism ID, Bacteria ENTEROCOCCUS FAECIUM (A)  Final      Susceptibility   Enterococcus faecium - MIC*    AMPICILLIN >=32 RESISTANT Resistant     NITROFURANTOIN 64 INTERMEDIATE Intermediate     VANCOMYCIN >=32 RESISTANT Resistant     GENTAMICIN SYNERGY SENSITIVE Sensitive     LINEZOLID 2 SENSITIVE Sensitive     * 40,000 COLONIES/mL ENTEROCOCCUS FAECIUM  Culture, blood (Routine x 2)     Status: None   Collection Time: 06/28/22 12:42 PM   Specimen: BLOOD  Result Value Ref Range Status   Specimen Description BLOOD  LEFT FOREARM  Final   Special Requests   Final    BOTTLES DRAWN AEROBIC AND ANAEROBIC Blood Culture adequate volume   Culture   Final    NO GROWTH 5 DAYS Performed at [facility], [address] [place], [place] [address]    Report Status 07/03/2022 FINAL  Final  Culture, blood (Routine x 2)     Status: None   Collection Time: 06/28/22  3:16 PM   Specimen: BLOOD  Result Value Ref Range Status   Specimen Description BLOOD BLOOD LEFT ARM  Final   Special Requests   Final    BOTTLES DRAWN AEROBIC AND ANAEROBIC Blood Culture results may not be optimal due to an inadequate volume of blood received in culture bottles   Culture   Final    NO GROWTH 5 DAYS Performed at [facility], [address] [place], [place] [address]    Report Status 07/03/2022 FINAL  Final  Culture, Respiratory w Gram Stain (tracheal aspirate)     Status: None   Collection Time: 06/28/22  3:16 PM   Specimen: SPU; Respiratory  Result Value Ref Range Status   Specimen Description   Final    SPUTUM Performed at [facility], [address], [place],  [address]    Special Requests   Final  SPUTUM Performed at  [facility], [address] [place], [place] [address]    Gram Stain   Final    ABUNDANT WBC PRESENT, PREDOMINANTLY PMN ABUNDANT GRAM POSITIVE COCCI IN CHAINS FEW YEAST FEW GRAM NEGATIVE RODS Performed at [facility], [place] [address], [place], [place] [address]    Culture RARE PSEUDOMONAS AERUGINOSA  Final   Report Status 07/01/2022 FINAL  Final   Organism ID, Bacteria PSEUDOMONAS AERUGINOSA  Final      Susceptibility   Pseudomonas aeruginosa - MIC*    CEFTAZIDIME 2 SENSITIVE Sensitive     CIPROFLOXACIN <=0.25 SENSITIVE Sensitive     GENTAMICIN 2 SENSITIVE Sensitive     IMIPENEM 2 SENSITIVE Sensitive     PIP/TAZO <=4 SENSITIVE Sensitive     CEFEPIME 1 SENSITIVE Sensitive     * RARE PSEUDOMONAS AERUGINOSA  MRSA Next Gen by PCR, Nasal     Status: None   Collection Time: 06/28/22  4:46 PM   Specimen: Nasal Mucosa; Nasal Swab  Result Value Ref Range Status   MRSA by PCR Next Gen NOT DETECTED NOT DETECTED Final    Comment: (NOTE) The GeneXpert MRSA Assay (FDA approved for NASAL specimens only), is one component of a comprehensive MRSA colonization surveillance program. It is not intended to diagnose MRSA infection nor to guide or monitor treatment for MRSA infections. Test performance is not FDA approved in patients less than 65 years old. Performed at [facility], [place], [place] [address]   Acid Fast Smear (AFB)     Status: None   Collection Time: 06/29/22  1:25 AM  Result Value Ref Range Status   AFB Specimen Processing Concentration  Final   Acid Fast Smear Negative  Final    Comment: (NOTE) Performed At: [facility] [place], [place] [id number] Rush Crounse MD [id number]    Source (AFB) EXPECTORATED SPUTUM  Corrected    Comment: Performed at [facility], [address] [place], [place] [address] CORRECTED ON 12/23 AT 0130: PREVIOUSLY REPORTED AS BDC   Expectorated Sputum Assessment w  Gram Stain, Rflx to Resp Cult     Status: None   Collection Time: 06/29/22  1:25 AM   Specimen: Expectorated Sputum  Result Value Ref Range Status   Specimen Description EXPECTORATED SPUTUM  Final   Special Requests NONE  Final   Sputum evaluation   Final    THIS SPECIMEN IS ACCEPTABLE FOR SPUTUM CULTURE Performed at [facility], [address] [place], [place] [address]    Report Status 06/29/2022 FINAL  Final  Culture, Respiratory w Gram Stain     Status: None   Collection Time: 06/29/22  1:25 AM  Result Value Ref Range Status   Specimen Description   Final    EXPECTORATED SPUTUM Performed at [facility], [address] [place], [place] [address]    Special Requests   Final    NONE Reflexed from [id number] Performed at [facility], [address] [place], [place] [address]    Gram Stain   Final    ABUNDANT WBC PRESENT, PREDOMINANTLY PMN MODERATE GRAM POSITIVE COCCI IN PAIRS IN CHAINS FEW GRAM NEGATIVE RODS FEW YEAST    Culture   Final    ABUNDANT Consistent with normal respiratory flora. No Pseudomonas species isolated Performed at [facility] [address] [place], [place] [address]    Report Status 07/01/2022 FINAL  Final  Acid Fast  Smear (AFB)     Status: None   Collection Time: 06/29/22 10:03 AM   Specimen: Expectorated Sputum  Result Value Ref Range Status   AFB Specimen Processing Concentration  Final   Acid Fast Smear Negative  Final    Comment: (NOTE) Performed At: [facility] [place], [place] [id number] Rush Neidert MD [id number]    Source (AFB) BRONCHIAL ALVEOLAR LAVAGE  Final    Comment: Performed at [facility], [address] [place], [place] [address]  Acid Fast Smear (AFB)     Status: None   Collection Time: 06/29/22  2:45 PM   Specimen: Expectorated Sputum  Result Value Ref Range Status   AFB Specimen Processing Concentration  Final   Acid Fast Smear Negative  Final     Comment: (NOTE) Performed At: [facility] [place] [address] [place], [place] [id number] Rush Timmins MD [id number]    Source (AFB) SPUTUM  Final    Comment: Performed at [facility], [address] [place], [place] [address]  Respiratory (~20 pathogens) panel by PCR     Status: None   Collection Time: 06/29/22  5:11 PM   Specimen: Nasopharyngeal Swab; Respiratory  Result Value Ref Range Status   Adenovirus NOT DETECTED NOT DETECTED Final   Coronavirus 229E NOT DETECTED NOT DETECTED Final    Comment: (NOTE) The Coronavirus on the Respiratory Panel, DOES NOT test for the novel  Coronavirus (2019 nCoV)    Coronavirus HKU1 NOT DETECTED NOT DETECTED Final   Coronavirus NL63 NOT DETECTED NOT DETECTED Final   Coronavirus OC43 NOT DETECTED NOT DETECTED Final   Metapneumovirus NOT DETECTED NOT DETECTED Final   Rhinovirus / Enterovirus NOT DETECTED NOT DETECTED Final   Influenza A NOT DETECTED NOT DETECTED Final   Influenza B NOT DETECTED NOT DETECTED Final   Parainfluenza Virus 1 NOT DETECTED NOT DETECTED Final   Parainfluenza Virus 2 NOT DETECTED NOT DETECTED Final   Parainfluenza Virus 3 NOT DETECTED NOT DETECTED Final   Parainfluenza Virus 4 NOT DETECTED NOT DETECTED Final   Respiratory Syncytial Virus NOT DETECTED NOT DETECTED Final   Bordetella pertussis NOT DETECTED NOT DETECTED Final   Bordetella Parapertussis NOT DETECTED NOT DETECTED Final   Chlamydophila pneumoniae NOT DETECTED NOT DETECTED Final   Mycoplasma pneumoniae NOT DETECTED NOT DETECTED Final    Comment: Performed at [facility], [place] [place], [place] [address]  Resp panel by RT-PCR (RSV, Flu A&B, Covid) Anterior Nasal Swab     Status: None   Collection Time: 06/29/22  5:11 PM   Specimen: Anterior Nasal Swab  Result Value Ref Range Status   SARS Coronavirus 2 by RT PCR NEGATIVE NEGATIVE Final    Comment: (NOTE) SARS-CoV-2 target nucleic acids are NOT DETECTED.  The  SARS-CoV-2 RNA is generally detectable in upper respiratory specimens during the acute phase of infection. The lowest concentration of SARS-CoV-2 viral copies this assay can detect is 138 copies/mL. A negative result does not preclude SARS-Cov-2 infection and should not be used as the sole basis for treatment or other patient management decisions. A negative result may occur with  improper specimen collection/handling, submission of specimen other than nasopharyngeal swab, presence of viral mutation(s) within the areas targeted by this assay, and inadequate number of viral copies(<138 copies/mL). A negative result must be combined with clinical observations, patient history, and epidemiological information. The expected result is Negative.  Fact Sheet for Patients:  EntrepreneurPulse.com.au  Fact Sheet for Healthcare  Providers:  IncredibleEmployment.be  This test is no t yet approved or cleared by the [place] and  has been authorized for detection and/or diagnosis of SARS-CoV-2 by FDA under an Emergency Use Authorization (EUA). This EUA will remain  in effect (meaning this test can be used) for the duration of the COVID-19 declaration under Section 564(b)(1) of the Act, 21 U.S.C.section 360bbb-3(b)(1), unless the authorization is terminated  or revoked sooner.       Influenza A by PCR NEGATIVE NEGATIVE Final   Influenza B by PCR NEGATIVE NEGATIVE Final    Comment: (NOTE) The Xpert Xpress SARS-CoV-2/FLU/RSV plus assay is intended as an aid in the diagnosis of influenza from Nasopharyngeal swab specimens and should not be used as a sole basis for treatment. Nasal washings and aspirates are unacceptable for Xpert Xpress SARS-CoV-2/FLU/RSV testing.  Fact Sheet for Patients: EntrepreneurPulse.com.au  Fact Sheet for Healthcare Providers: IncredibleEmployment.be  This test is not yet approved or  cleared by the [place] FDA and has been authorized for detection and/or diagnosis of SARS-CoV-2 by FDA under an Emergency Use Authorization (EUA). This EUA will remain in effect (meaning this test can be used) for the duration of the COVID-19 declaration under Section 564(b)(1) of the Act, 21 U.S.C. section 360bbb-3(b)(1), unless the authorization is terminated or revoked.     Resp Syncytial Virus by PCR NEGATIVE NEGATIVE Final    Comment: (NOTE) Fact Sheet for Patients: EntrepreneurPulse.com.au  Fact Sheet for Healthcare Providers: IncredibleEmployment.be  This test is not yet approved or cleared by the [place] FDA and has been authorized for detection and/or diagnosis of SARS-CoV-2 by FDA under an Emergency Use Authorization (EUA). This EUA will remain in effect (meaning this test can be used) for the duration of the COVID-19 declaration under Section 564(b)(1) of the Act, 21 U.S.C. section 360bbb-3(b)(1), unless the authorization is terminated or revoked.  Performed at [facility], [address], [place], [place] [address]      Radiology Studies: No results found.   Marzetta Board, MD, PhD [facility]  Between 7 am - 7 pm I am available, please contact me via Amion (for emergencies) or Securechat (non urgent messages)  Between 7 pm - 7 am I am not available, please contact night coverage MD/APP via Amion

## 2022-07-05 DIAGNOSIS — J850 Gangrene and necrosis of lung: Secondary | ICD-10-CM | POA: Diagnosis not present

## 2022-07-05 DIAGNOSIS — J9601 Acute respiratory failure with hypoxia: Secondary | ICD-10-CM | POA: Diagnosis not present

## 2022-07-05 LAB — BASIC METABOLIC PANEL
Anion gap: 6 (ref 5–15)
BUN: 21 mg/dL (ref 8–23)
CO2: 28 mmol/L (ref 22–32)
Calcium: 8 mg/dL — ABNORMAL LOW (ref 8.9–10.3)
Chloride: 99 mmol/L (ref 98–111)
Creatinine, Ser: 0.82 mg/dL (ref 0.44–1.00)
GFR, Estimated: >60 mL/min (ref >60)
Glucose, Bld: 93 mg/dL (ref 70–99)
Potassium: 4.1 mmol/L (ref 3.5–5.1)
Sodium: 133 mmol/L — ABNORMAL LOW (ref 135–145)

## 2022-07-05 LAB — CBC
HCT: 23.3 % — ABNORMAL LOW (ref 36.0–46.0)
Hemoglobin: 8 g/dL — ABNORMAL LOW (ref 12.0–15.0)
MCH: 32.1 pg (ref 26.0–34.0)
MCHC: 34.3 g/dL (ref 30.0–36.0)
MCV: 93.6 fL (ref 80.0–100.0)
Platelets: 306 10*3/uL (ref 150–400)
RBC: 2.49 MIL/uL — ABNORMAL LOW (ref 3.87–5.11)
RDW: 15.2 % (ref 11.5–15.5)
WBC: 14.8 10*3/uL — ABNORMAL HIGH (ref 4.0–10.5)
nRBC: 0 % (ref 0.0–0.2)

## 2022-07-05 LAB — GLUCOSE, CAPILLARY
Glucose-Capillary: 107 mg/dL — ABNORMAL HIGH (ref 70–99)
Glucose-Capillary: 57 mg/dL — ABNORMAL LOW (ref 70–99)
Glucose-Capillary: 71 mg/dL (ref 70–99)
Glucose-Capillary: 213 mg/dL — ABNORMAL HIGH (ref 70–99)
Glucose-Capillary: 186 mg/dL — ABNORMAL HIGH (ref 70–99)

## 2022-07-05 LAB — MAGNESIUM: Magnesium: 1.8 mg/dL (ref 1.7–2.4)

## 2022-07-05 MED ORDER — CEFADROXIL 500 MG PO CAPS
500.0000 mg | ORAL_CAPSULE | Freq: Two times a day (BID) | ORAL | Status: DC
  Administered 2022-07-05 – 2022-07-06 (×3): 500 mg via ORAL
  Filled 2022-07-05 (×3): qty 1

## 2022-07-05 MED ORDER — METRONIDAZOLE 500 MG PO TABS
500.0000 mg | ORAL_TABLET | Freq: Two times a day (BID) | ORAL | Status: DC
  Administered 2022-07-05 – 2022-07-06 (×3): 500 mg via ORAL
  Filled 2022-07-05 (×4): qty 1

## 2022-07-05 MED ORDER — CIPROFLOXACIN HCL 500 MG PO TABS
500.0000 mg | ORAL_TABLET | Freq: Two times a day (BID) | ORAL | Status: DC
  Administered 2022-07-05 – 2022-07-06 (×2): 500 mg via ORAL
  Filled 2022-07-05 (×2): qty 1

## 2022-07-05 MED ORDER — LOPERAMIDE HCL 2 MG PO CAPS
2.0000 mg | ORAL_CAPSULE | ORAL | Status: DC | PRN
  Administered 2022-07-05 – 2022-07-06 (×2): 2 mg via ORAL
  Filled 2022-07-05 (×2): qty 1

## 2022-07-05 NOTE — Progress Notes (Addendum)
   Date of Admission:  06/28/2022      ID: Regina Bernard is a 65 y.o. female Principal Problem:   Acute hypoxic respiratory failure ([place]) Active Problems:   COPD with acute exacerbation (HCC)   Liver cirrhosis (HCC)   Aspiration pneumonia (HCC)   Septic shock (HCC)   AKI (acute kidney injury) ([place])   Hyponatremia   Necrotizing pneumonia (HCC)   Malnutrition of moderate degree    Subjective: Pt is doing better  Working with PT Has some back pain which is chronic but controlled Medications:   cefadroxil  500 mg Oral BID   ciprofloxacin  500 mg Oral BID   DULoxetine  30 mg Oral Daily   enoxaparin (LOVENOX) injection  40 mg Subcutaneous Q24H   feeding supplement  237 mL Oral TID BM   fluticasone furoate-vilanterol  1 puff Inhalation Daily   And   umeclidinium bromide  1 puff Inhalation Daily   gabapentin  300 mg Oral TID   insulin aspart  0-6 Units Subcutaneous TID WC   ipratropium-albuterol  3 mL Nebulization BID   lamoTRIgine  100 mg Oral Daily   lidocaine  1 patch Transdermal Q24H   metoprolol succinate  100 mg Oral Daily   metroNIDAZOLE  500 mg Oral Q12H   multivitamin with minerals  1 tablet Oral Daily   pantoprazole (PROTONIX) IV  40 mg Intravenous Q12H   predniSONE  20 mg Oral Q breakfast   saccharomyces boulardii  250 mg Oral BID    Objective: Vital signs in last 24 hours: Temp:  [97.6 F (36.4 C)-98.5 F (36.9 C)] 98.3 F (36.8 C) (12/29 0510) Pulse Rate:  [65-69] 65 (12/29 0732) Resp:  [16-20] 20 (12/29 0732) BP: (130-154)/(77-91) 154/89 (12/29 0732) SpO2:  [96 %-99 %] 98 % (12/29 0732)    PHYSICAL EXAM:  General: Alert, cooperative, no distress, appears stated age.  Lungs: crepts left side Heart: Regular rate and rhythm, no murmur, rub or gallop. Abdomen: Soft, non-tender,not distended. Bowel sounds normal. No masses Extremities: atraumatic, no cyanosis. No edema. No clubbing Skin: No rashes or lesions. Or bruising Lymph: Cervical,  supraclavicular normal. Neurologic: Grossly non-focal  Lab Results Recent Labs    07/04/22 0431 07/05/22 0441  WBC 14.1* 14.8*  HGB 8.6* 8.0*  HCT 26.1* 23.3*  NA 132* 133*  K 4.3 4.1  CL 99 99  CO2 29 28  BUN 19 21  CREATININE 0.84 0.82   Microbiology: Pseudomonas sputum culture Afb X 3 neg   Assessment/Plan:  Necrotizing pneumonia of the left upper lung and lingula Could have aspirated Also has pseudomonas Currently on meropenem Pt has PCN allergy- says she had a  rash when she was in her teens. She is not sure whether she has taken amoxicillin ( I do see a prescription for augmentin given in sept 2022 when she was hospitalized at [facility] ) but it was never prescribed   So  she will be given cipro+ cefadroxil+flagyl for 3 more weeks until 07/27/22 Follow up with pulmonary as OP  COPD/Emphysema  H/o compression fracture of vertebrae s/p kyphoplasty  Pharmacist to explain side-effects of cipro and give material to read before discharge  Discussed the management with the patient and care team ID will follow her peripherally this weekend- call if needed

## 2022-07-05 NOTE — Progress Notes (Signed)
Physical Therapy Treatment Patient Details Name: Regina Bernard MRN: [id number] DOB: 1955-12-23 Today's Date: 07/05/2022   History of Present Illness Pt is a 66 year old female admitted with Necrotizing pneumonia left upper lobe, COPD exacerbation, septic shock, PMH significant for COPD, long standing smoking history (at least 50 pack years) as well as previous history of drug and alcohol use, and liver cirrhosis (History of EtOH use and HCV infection)    PT Comments    Pt received upright in bed agreeable to PT. Pt with [place] on but not connected to O2 on wall. Pt satting low 80's. Pt returned to 3L/min with quick return to 88-90% with PLB. Pt transfer seated EOB mod-I and dons new socks. Pt performing x2 gait bouts with PT this date. Pt performing 41' without AD with quick report of LE weakness, mild drifting R/L variable step lengths needing titration to 4 L/min and minguard support. Pt returns sitting EOB at 88% on 4 L/min with increased WOB. Pt quickly returning to > 90% then performing ~140' with RW at supervision performing step through gait and improvement in energy conservation and balance with AD maintaining SPO2 at 91% post gait bout. Educated pt on clear necessity for use of 4WW at home for O2 demand, balance, energy conservation and for her LE weakness. Pt in agreement. Also educated pt on possible benefits of OP cardiopulmonary rehab after return home completing [facility]. Pt understanding and appears interested. D/c recs remain appropriate. Pt supine in bed with all needs in reach titrated to 3 L/min.    Recommendations for follow up therapy are one component of a multi-disciplinary discharge planning process, led by the attending physician.  Recommendations may be updated based on patient status, additional functional criteria and insurance authorization.  Follow Up Recommendations  Home health PT     Assistance Recommended at Discharge Intermittent Supervision/Assistance  Patient can  return home with the following A little help with walking and/or transfers;A little help with bathing/dressing/bathroom;Assistance with cooking/housework;Assist for transportation;Help with stairs or ramp for entrance   Equipment Recommendations  None recommended by PT    Recommendations for Other Services       Precautions / Restrictions Precautions Precautions: Fall Restrictions Weight Bearing Restrictions: No     Mobility  Bed Mobility               General bed mobility comments: NT. In recliner pre and post session Patient Response: Cooperative  Transfers Overall transfer level: Needs assistance Equipment used: None Transfers: Sit to/from Stand Sit to Stand: Supervision                Ambulation/Gait Ambulation/Gait assistance: Supervision, Min guard Gait Distance (Feet):  (140' + 65'.) Assistive device: None, Rolling walker (2 wheels) Gait Pattern/deviations: Step-through pattern, Step-to pattern, Drifts right/left, Staggering right, Staggering left       General Gait Details: Improved gait tolerance with RW performing > 100' without need for seated rest.   Stairs             Wheelchair Mobility    Modified Rankin (Stroke Patients Only)       Balance Overall balance assessment: Needs assistance Sitting-balance support: No upper extremity supported, Feet supported Sitting balance-Leahy Scale: Normal     Standing balance support: During functional activity, No upper extremity supported Standing balance-Leahy Scale: Fair  Cognition Arousal/Alertness: Awake/alert Behavior During Therapy: WFL for tasks assessed/performed Overall Cognitive Status: Within Functional Limits for tasks assessed                                          Exercises Other Exercises Other Exercises: Benefits of possible OP Cardiopulmonary rehab. Continued use of AD for energy conservation/LE  weakness/balance.    General Comments        Pertinent Vitals/Pain Pain Assessment Pain Assessment: No/denies pain    Home Living                          Prior Function            PT Goals (current goals can now be found in the care plan section) Acute Rehab PT Goals Patient Stated Goal: to improve overall strength and mobility PT Goal Formulation: With patient Time For Goal Achievement: 07/16/22 Progress towards PT goals: Progressing toward goals    Frequency    Min 2X/week      PT Plan Current plan remains appropriate    Co-evaluation              AM-PAC PT "6 Clicks" Mobility   Outcome Measure  Help needed turning from your back to your side while in a flat bed without using bedrails?: None Help needed moving from lying on your back to sitting on the side of a flat bed without using bedrails?: None Help needed moving to and from a bed to a chair (including a wheelchair)?: A Little Help needed standing up from a chair using your arms (e.g., wheelchair or bedside chair)?: A Little Help needed to walk in hospital room?: A Little Help needed climbing 3-5 steps with a railing? : A Little 6 Click Score: 20    End of Session Equipment Utilized During Treatment: Gait belt;Oxygen Activity Tolerance: Patient tolerated treatment well Patient left: in bed;with call bell/phone within reach;with bed alarm set;with family/visitor present Nurse Communication: Mobility status PT Visit Diagnosis: Unsteadiness on feet (R26.81);Muscle weakness (generalized) (M62.81);Other abnormalities of gait and mobility (R26.89)     Time: 8850-2774 PT Time Calculation (min) (ACUTE ONLY): 25 min  Charges:  $Gait Training: 23-37 mins                     Salem Caster. Fairly IV, PT, DPT Physical Therapist- [facility]  07/05/2022, 1:10 PM

## 2022-07-05 NOTE — Progress Notes (Signed)
Occupational Therapy Treatment Patient Details Name: Regina Bernard MRN: [id number] DOB: 07/07/1956 Today's Date: 07/05/2022   History of present illness Pt is a 66 year old female admitted with Necrotizing pneumonia left upper lobe, COPD exacerbation, septic shock, PMH significant for COPD, long standing smoking history (at least 50 pack years) as well as previous history of drug and alcohol use, and liver cirrhosis (History of EtOH use and HCV infection)   OT comments  Ms. Matthew made good effort today, performing bed mobility and transfers w/ SUPV-CGA. Engages in UE and LE therex in standing while maintaining O2 sats at 95% or > on 3L O2. Minor LOB during ambulation, w/ pt able to self-correct. Provided educ re: ECS, breathing techniques, with pt endorsing understanding and providing teach-back. Pt endorses 6/10 back pain but states this is better than her baseline and that she is making an effort to "stay on top of my pain." After 10+ minutes of OOB activity, pt states she is feeling very fatigued and requests returning to bed. DC recs home with [facility] remain appropriate.   Recommendations for follow up therapy are one component of a multi-disciplinary discharge planning process, led by the attending physician.  Recommendations may be updated based on patient status, additional functional criteria and insurance authorization.    Follow Up Recommendations  Home health OT     Assistance Recommended at Discharge Intermittent Supervision/Assistance  Patient can return home with the following  A little help with bathing/dressing/bathroom   Equipment Recommendations  None recommended by OT    Recommendations for Other Services      Precautions / Restrictions Precautions Precautions: Fall Precaution Comments: contact precautions Restrictions Weight Bearing Restrictions: No       Mobility Bed Mobility Overal bed mobility: Needs Assistance Bed Mobility: Supine to Sit, Sit to Supine      Supine to sit: Supervision Sit to supine: Supervision   General bed mobility comments: increased time and effort    Transfers Overall transfer level: Needs assistance Equipment used: Rolling walker (2 wheels) Transfers: Sit to/from Stand Sit to Stand: Min guard                 Balance Overall balance assessment: Needs assistance Sitting-balance support: No upper extremity supported, Feet supported Sitting balance-Leahy Scale: Good     Standing balance support: During functional activity, Bilateral upper extremity supported, Single extremity supported Standing balance-Leahy Scale: Fair Standing balance comment: 1-handed UE on RW; fatigues quickly with standing                           ADL either performed or assessed with clinical judgement   ADL                                              Extremity/Trunk Assessment Upper Extremity Assessment Upper Extremity Assessment: Overall WFL for tasks assessed   Lower Extremity Assessment Lower Extremity Assessment: Generalized weakness        Vision       Perception     Praxis      Cognition Arousal/Alertness: Awake/alert Behavior During Therapy: WFL for tasks assessed/performed Overall Cognitive Status: Within Functional Limits for tasks assessed  Exercises Other Exercises Other Exercises: ECS, safe use of DME, falls prevention, breathing techniques    Shoulder Instructions       General Comments Spo2 95% with OOB mobility and 3L O2    Pertinent Vitals/ Pain       Pain Assessment Pain Score: 6  Pain Location: generalized, lower back Pain Descriptors / Indicators: Aching Pain Intervention(s): Monitored during session, Repositioned, Premedicated before session  Home Living                                          Prior Functioning/Environment              Frequency  Min 2X/week         Progress Toward Goals  OT Goals(current goals can now be found in the care plan section)  Progress towards OT goals: Progressing toward goals  Acute Rehab OT Goals OT Goal Formulation: With patient Time For Goal Achievement: 07/17/22 Potential to Achieve Goals: Good  Plan Discharge plan remains appropriate;Frequency remains appropriate    Co-evaluation                 AM-PAC OT "6 Clicks" Daily Activity     Outcome Measure   Help from another person eating meals?: None Help from another person taking care of personal grooming?: None Help from another person toileting, which includes using toliet, bedpan, or urinal?: A Little Help from another person bathing (including washing, rinsing, drying)?: A Little Help from another person to put on and taking off regular upper body clothing?: None Help from another person to put on and taking off regular lower body clothing?: None 6 Click Score: 22    End of Session Equipment Utilized During Treatment: Rolling walker (2 wheels);Oxygen      Activity Tolerance Patient tolerated treatment well   Patient Left in bed;with call bell/phone within reach;with bed alarm set;Other (comment) (MD in room)   Nurse Communication          Time: 9983-3825 OT Time Calculation (min): 17 min  Charges: OT General Charges $OT Visit: 1 Visit OT Treatments $Self Care/Home Management : 8-22 mins Josiah Lobo, PhD, MS, OTR/L 07/05/22, 4:23 PM

## 2022-07-05 NOTE — Progress Notes (Signed)
PROGRESS NOTE  Regina Bernard QBV:[id number] DOB: [birth year] DOA: 06/28/2022 PCP: Juluis Pitch, MD   LOS: 7 days   Brief Narrative / Interim history: 66 year old female patient with history of COPD and tobacco abuse as well as prior history of polysubstance abuse and underlying liver cirrhosis from alcohol as well as HCV.  Presented to the hospital with shortness of breath and failure to thrive as evidenced by significant weight loss unintentional.  At presentation she was hypotensive and met sepsis criteria.  She had increased work of breathing that improved after application of BiPAP.  CT of the chest revealed very extensive new consolidation of the left upper lobe and lingula with a large air and fluid containing cavity within the lingula measuring 7.9 x 5.8 cm consistent with cavitary infection.  Radiologist documented that mass relatively excluded given rapid development of this finding in comparison to recent CT chest from 05/15/22.  Patient was admitted to the ICU for further evaluation and treatment.  She had clinical signs of sepsis with shock and required pressor support.  AFB sputum and BAL were sent given concern for possible TB at presentation.  She eventually was transferred out of the ICU to the hospitalist service on 06/30/2022.   Subjective / 24h Interval events: Feels weak today, she thinks she needs SNF.  Assesement and Plan: Principal Problem:   Acute hypoxic respiratory failure (HCC) Active Problems:   Liver cirrhosis (HCC)   COPD with acute exacerbation (HCC)   Aspiration pneumonia (HCC)   Septic shock (HCC)   AKI (acute kidney injury) ([place])   Hyponatremia   Necrotizing pneumonia (HCC)   Malnutrition of moderate degree   Principal problem Septic shock due to necrotizing pneumonia left upper lobe -patient initially required admission to the ICU for vasopressors.  She was placed on broad-spectrum antibiotics.  ID consulted.  Currently she is on  meropenem.leukocytosis improving, still in the 14 range but she is also on steroids.  Discussed with ID, change to ciprofloxacin, cefadroxil as well as metronidazole.  If clinically stable over the next 24 hours plan discharge home this weekend  Active problems Advanced COPD with exacerbation, chronic hypoxic respiratory failure -she is on 2 L at home.  Has been placed on steroids, dose decreased to 20 mg.   Liver Cirrhosis - HCV treated- undetectable VL. Stable with no transaminitis and normal total bilirubin.   History of UGIB - Has a history of PUD and H. Pylori with previous GI bleed. Hemoglobin is stable   Hyponatremia -Secondary to cirrhosis and associated hypoalbuminemia-sodium currently stable   Chronic back pain -worsened during hospitalization, continue gabapentin,  Anxiety/Depression -continue Duloxetine, Lamictal   HTN - Continue home Toprol   Scheduled Meds:  DULoxetine  30 mg Oral Daily   enoxaparin (LOVENOX) injection  40 mg Subcutaneous Q24H   feeding supplement  237 mL Oral TID BM   fluticasone furoate-vilanterol  1 puff Inhalation Daily   And   umeclidinium bromide  1 puff Inhalation Daily   gabapentin  300 mg Oral TID   insulin aspart  0-6 Units Subcutaneous TID WC   ipratropium-albuterol  3 mL Nebulization BID   lamoTRIgine  100 mg Oral Daily   lidocaine  1 patch Transdermal Q24H   metoprolol succinate  100 mg Oral Daily   multivitamin with minerals  1 tablet Oral Daily   pantoprazole (PROTONIX) IV  40 mg Intravenous Q12H   predniSONE  20 mg Oral Q breakfast   saccharomyces boulardii  250 mg Oral  BID   Continuous Infusions:  meropenem (MERREM) IV 1 g (07/05/22 0506)   PRN Meds:.albuterol, docusate sodium, hydrALAZINE, mouth rinse, oxyCODONE, polyethylene glycol  Current Outpatient Medications  Medication Instructions   albuterol (PROVENTIL) 2.5 mg, Nebulization, Every 6 hours PRN   diazepam (VALIUM) 2 MG tablet SMARTSIG:1 Tablet(s) By Mouth   DULoxetine  (CYMBALTA) 30 mg, Oral, 2 times daily   gabapentin (NEURONTIN) 300 mg, Oral, 3 times daily   HYDROcodone-acetaminophen (NORCO/VICODIN) 5-325 MG tablet 1 tablet, 2 times daily PRN   lamoTRIgine (LAMICTAL) 100 mg, Oral, Daily   metoprolol succinate (TOPROL-XL) 100 mg, Oral, Daily, Take with or immediately following a meal.    Multiple Vitamin (MULTI-VITAMINS) TABS 1 tablet, Oral, Daily   nicotine (NICODERM CQ - DOSED IN MG/24 HOURS) 21 mg, Transdermal, Daily   omeprazole (PRILOSEC) 40 mg, Oral, Every morning   OXYGEN 2 L, Inhalation, at bedtime and repeat x1 PRN   QUEtiapine (SEROQUEL) 25 mg, Oral, At bedtime PRN   SENEXON-S 8.6-50 MG tablet 2 tablets, Oral, 2 times daily   TRELEGY ELLIPTA 100-62.5-25 MCG/INH AEPB 1 puff, Inhalation, Daily    Diet Orders (From admission, onward)     Start     Ordered   06/30/22 0550  Diet regular Room service appropriate? Yes; Fluid consistency: Thin  Diet effective now       Question Answer Comment  Room service appropriate? Yes   Fluid consistency: Thin      06/30/22 0549            DVT prophylaxis: enoxaparin (LOVENOX) injection 40 mg Start: 06/28/22 2200 SCDs Start: 06/28/22 1506   Lab Results  Component Value Date   PLT 306 07/05/2022      Code Status: Full Code  Family Communication: no family at bedside   Status is: Inpatient  Remains inpatient appropriate because: leukocytosis   Level of care: Med-Surg  Consultants:  ID PCCM  Objective: Vitals:   07/04/22 1700 07/04/22 2025 07/05/22 0510 07/05/22 0732  BP: (!) 145/89 (!) 141/91 (!) 146/77 (!) 154/89  Pulse: 68 65 66 65  Resp: _0 Temp: 97.6 F (36.4 C) 98.2 F (36.8 C) 98.3 F (36.8 C)   TempSrc:      SpO2: 99% 97% 96% 98%  Weight:      Height:       No intake or output data in the 24 hours ending 07/05/22 1226  Wt Readings from Last 3 Encounters:  07/04/22 61.7 kg  03/20/22 56.2 kg  02/14/22 55.9 kg    Examination:  Constitutional:  NAD Eyes: lids and conjunctivae normal, no scleral icterus ENMT: mmm Neck: normal, supple Respiratory: clear to auscultation bilaterally, no wheezing, no crackles. Normal respiratory effort.  Cardiovascular: Regular rate and rhythm, no murmurs / rubs / gallops. No LE edema. Abdomen: soft, no distention, no tenderness. Bowel sounds positive.  Skin: no rashes Neurologic: no focal deficits, equal strength   Data Reviewed: I have independently reviewed following labs and imaging studies   CBC Recent Labs  Lab 06/28/22 1242 06/29/22 0450 06/29/22 1445 06/30/22 0454 07/01/22 0624 07/02/22 0755 07/03/22 0655 07/04/22 0431 07/05/22 0441  WBC 47.2*   < > 29.7* 26.9* 22.0* 20.3* 15.3* 14.1* 14.8*  HGB 9.9*   < > 8.0* 7.6* 8.3* 8.0* 8.2* 8.6* 8.0*  HCT 30.3*   < > 24.1* 22.8* 24.6* 23.8* 24.3* 26.1* 23.3*  PLT 520*   < > 398 348 340 320 284 311 306  MCV 97.4   < >  97.2 95.8 94.3 93.7 93.5 93.5 93.6  MCH 31.8   < > 32.3 31.9 31.8 31.5 31.5 30.8 32.1  MCHC 32.7   < > 33.2 33.3 33.7 33.6 33.7 33.0 34.3  RDW 13.9   < > 14.2 14.2 14.2 14.5 14.6 14.6 15.2  LYMPHSABS 0.9  --  0.3* 0.5*  --   --   --   --   --   MONOABS 1.5*  --  0.6 1.0  --   --   --   --   --   EOSABS 0.1  --  0.1 0.0  --   --   --   --   --   BASOSABS 0.1  --  0.1 0.1  --   --   --   --   --    < > = values in this interval not displayed.     Recent Labs  Lab 06/28/22 1242 06/28/22 1516 06/28/22 2132 06/29/22 0450 07/01/22 0624 07/02/22 0754 07/02/22 0755 07/03/22 0655 07/04/22 0431 07/05/22 0441  NA 129*  --   --    < > 136  --  135 134* 132* 133*  K 3.9  --   --    < > 4.0  --  3.9 4.0 4.3 4.1  CL 93*  --   --    < > 105  --  103 101 99 99  CO2 24  --   --    < > 25  --  _0 GLUCOSE 124*  --   --    < > 66*  --  68* 82 89 93  BUN 20  --   --    < > 24*  --  _1 CREATININE 1.02*  --   --    < > 0.90  --  0.86 0.82 0.84 0.82  CALCIUM 8.4*  --   --    < > 8.1*  --  8.2* 8.3* 8.1* 8.0*   AST 48*  --   --   --   --  24  --   --   --   --   ALT 27  --   --   --   --  14  --   --   --   --   ALKPHOS 223*  --   --   --   --  162*  --   --   --   --   BILITOT 2.8*  --   --   --   --  1.0  --   --   --   --   ALBUMIN 2.2*  --   --   --   --  1.8*  --   --   --   --   MG  --   --   --    < > 1.7  --  1.6* 1.9 1.6* 1.8  DDIMER  --   --  6.40*  --   --   --   --   --   --   --   PROCALCITON  --  1.12  --   --   --   --   --   --   --   --   LATICACIDVEN 5.5* 4.1* 1.7  --   --   --   --   --   --   --   INR  1.1  --   --   --   --   --   --   --   --   --    < > = values in this interval not displayed.     ------------------------------------------------------------------------------------------------------------------ No results for input(s): "CHOL", "HDL", "LDLCALC", "TRIG", "CHOLHDL", "LDLDIRECT" in the last 72 hours.  Lab Results  Component Value Date   HGBA1C 5.4 01/15/2018   ------------------------------------------------------------------------------------------------------------------ No results for input(s): "TSH", "T4TOTAL", "T3FREE", "THYROIDAB" in the last 72 hours.  Invalid input(s): "FREET3"  Cardiac Enzymes No results for input(s): "CKMB", "TROPONINI", "MYOGLOBIN" in the last 168 hours.  Invalid input(s): "CK" ------------------------------------------------------------------------------------------------------------------    Component Value Date/Time   BNP 151.2 (H) 10/28/2021 1858    CBG: Recent Labs  Lab 07/04/22 1608 07/04/22 2019 07/05/22 0730 07/05/22 0810 07/05/22 1212  GLUCAP 192* 183* 57* 107* 71     Recent Results (from the past 240 hour(s))  Urine Culture     Status: Abnormal   Collection Time: 06/28/22 10:05 AM   Specimen: Urine, Clean Catch  Result Value Ref Range Status   Specimen Description   Final    URINE, CLEAN CATCH Performed at [facility], [address] [place], [place] [address]    Special Requests    Final    NONE Performed at [facility], [address] [place], [place] [address]    Culture (A)  Final    40,000 COLONIES/mL ENTEROCOCCUS FAECIUM VANCOMYCIN RESISTANT ENTEROCOCCUS ISOLATED    Report Status 07/02/2022 FINAL  Final   Organism ID, Bacteria ENTEROCOCCUS FAECIUM (A)  Final      Susceptibility   Enterococcus faecium - MIC*    AMPICILLIN >=32 RESISTANT Resistant     NITROFURANTOIN 64 INTERMEDIATE Intermediate     VANCOMYCIN >=32 RESISTANT Resistant     GENTAMICIN SYNERGY SENSITIVE Sensitive     LINEZOLID 2 SENSITIVE Sensitive     * 40,000 COLONIES/mL ENTEROCOCCUS FAECIUM  Culture, blood (Routine x 2)     Status: None   Collection Time: 06/28/22 12:42 PM   Specimen: BLOOD  Result Value Ref Range Status   Specimen Description BLOOD  LEFT FOREARM  Final   Special Requests   Final    BOTTLES DRAWN AEROBIC AND ANAEROBIC Blood Culture adequate volume   Culture   Final    NO GROWTH 5 DAYS Performed at [facility], [address] [place], [place] [address]    Report Status 07/03/2022 FINAL  Final  Culture, blood (Routine x 2)     Status: None   Collection Time: 06/28/22  3:16 PM   Specimen: BLOOD  Result Value Ref Range Status   Specimen Description BLOOD BLOOD LEFT ARM  Final   Special Requests   Final    BOTTLES DRAWN AEROBIC AND ANAEROBIC Blood Culture results may not be optimal due to an inadequate volume of blood received in culture bottles   Culture   Final    NO GROWTH 5 DAYS Performed at [facility], [address] [place], [place] [address]    Report Status 07/03/2022 FINAL  Final  Culture, Respiratory w Gram Stain (tracheal aspirate)     Status: None   Collection Time: 06/28/22  3:16 PM   Specimen: SPU; Respiratory  Result Value Ref Range Status   Specimen Description   Final    SPUTUM Performed at [facility], [address], [place], [place] [address]    Special Requests   Final  SPUTUM Performed at  [facility], [address] [place], [place] [address]    Gram Stain   Final    ABUNDANT WBC PRESENT, PREDOMINANTLY PMN ABUNDANT GRAM POSITIVE COCCI IN CHAINS FEW YEAST FEW GRAM NEGATIVE RODS Performed at [facility], [place] [address], [place], [place] [address]    Culture RARE PSEUDOMONAS AERUGINOSA  Final   Report Status 07/01/2022 FINAL  Final   Organism ID, Bacteria PSEUDOMONAS AERUGINOSA  Final      Susceptibility   Pseudomonas aeruginosa - MIC*    CEFTAZIDIME 2 SENSITIVE Sensitive     CIPROFLOXACIN <=0.25 SENSITIVE Sensitive     GENTAMICIN 2 SENSITIVE Sensitive     IMIPENEM 2 SENSITIVE Sensitive     PIP/TAZO <=4 SENSITIVE Sensitive     CEFEPIME 1 SENSITIVE Sensitive     * RARE PSEUDOMONAS AERUGINOSA  MRSA Next Gen by PCR, Nasal     Status: None   Collection Time: 06/28/22  4:46 PM   Specimen: Nasal Mucosa; Nasal Swab  Result Value Ref Range Status   MRSA by PCR Next Gen NOT DETECTED NOT DETECTED Final    Comment: (NOTE) The GeneXpert MRSA Assay (FDA approved for NASAL specimens only), is one component of a comprehensive MRSA colonization surveillance program. It is not intended to diagnose MRSA infection nor to guide or monitor treatment for MRSA infections. Test performance is not FDA approved in patients less than 68 years old. Performed at [facility], [place], [place] [address]   Acid Fast Smear (AFB)     Status: None   Collection Time: 06/29/22  1:25 AM  Result Value Ref Range Status   AFB Specimen Processing Concentration  Final   Acid Fast Smear Negative  Final    Comment: (NOTE) Performed At: [facility] [place], [place] [id number] Rush Luzier MD [id number]    Source (AFB) EXPECTORATED SPUTUM  Corrected    Comment: Performed at [facility], [address] [place], [place] [address] CORRECTED ON 12/23 AT 0130: PREVIOUSLY REPORTED AS BDC   Expectorated Sputum Assessment w  Gram Stain, Rflx to Resp Cult     Status: None   Collection Time: 06/29/22  1:25 AM   Specimen: Expectorated Sputum  Result Value Ref Range Status   Specimen Description EXPECTORATED SPUTUM  Final   Special Requests NONE  Final   Sputum evaluation   Final    THIS SPECIMEN IS ACCEPTABLE FOR SPUTUM CULTURE Performed at [facility], [address], [place], [place] [address]    Report Status 06/29/2022 FINAL  Final  Culture, Respiratory w Gram Stain     Status: None   Collection Time: 06/29/22  1:25 AM  Result Value Ref Range Status   Specimen Description   Final    EXPECTORATED SPUTUM Performed at [facility], [address] [place], [place] [address]    Special Requests   Final    NONE Reflexed from [id number] Performed at [facility], [address] [place], [place] [address]    Gram Stain   Final    ABUNDANT WBC PRESENT, PREDOMINANTLY PMN MODERATE GRAM POSITIVE COCCI IN PAIRS IN CHAINS FEW GRAM NEGATIVE RODS FEW YEAST    Culture   Final    ABUNDANT Consistent with normal respiratory flora. No Pseudomonas species isolated Performed at [facility] [address], [place], [place] [address]    Report Status 07/01/2022 FINAL  Final  Acid Fast  Smear (AFB)     Status: None   Collection Time: 06/29/22 10:03 AM   Specimen: Expectorated Sputum  Result Value Ref Range Status   AFB Specimen Processing Concentration  Final   Acid Fast Smear Negative  Final    Comment: (NOTE) Performed At: [facility] [place], [place] [id number] Rush Eastep MD [id number]    Source (AFB) BRONCHIAL ALVEOLAR LAVAGE  Final    Comment: Performed at [facility], [address] [place], [place] [address]  Acid Fast Smear (AFB)     Status: None   Collection Time: 06/29/22  2:45 PM   Specimen: Expectorated Sputum  Result Value Ref Range Status   AFB Specimen Processing Concentration  Final   Acid Fast Smear Negative  Final     Comment: (NOTE) Performed At: [facility] [address] [place], [place] [id number] Rush Lowder MD [id number]    Source (AFB) SPUTUM  Final    Comment: Performed at [facility], [address] [place], [place] [address]  Respiratory (~20 pathogens) panel by PCR     Status: None   Collection Time: 06/29/22  5:11 PM   Specimen: Nasopharyngeal Swab; Respiratory  Result Value Ref Range Status   Adenovirus NOT DETECTED NOT DETECTED Final   Coronavirus 229E NOT DETECTED NOT DETECTED Final    Comment: (NOTE) The Coronavirus on the Respiratory Panel, DOES NOT test for the novel  Coronavirus (2019 nCoV)    Coronavirus HKU1 NOT DETECTED NOT DETECTED Final   Coronavirus NL63 NOT DETECTED NOT DETECTED Final   Coronavirus OC43 NOT DETECTED NOT DETECTED Final   Metapneumovirus NOT DETECTED NOT DETECTED Final   Rhinovirus / Enterovirus NOT DETECTED NOT DETECTED Final   Influenza A NOT DETECTED NOT DETECTED Final   Influenza B NOT DETECTED NOT DETECTED Final   Parainfluenza Virus 1 NOT DETECTED NOT DETECTED Final   Parainfluenza Virus 2 NOT DETECTED NOT DETECTED Final   Parainfluenza Virus 3 NOT DETECTED NOT DETECTED Final   Parainfluenza Virus 4 NOT DETECTED NOT DETECTED Final   Respiratory Syncytial Virus NOT DETECTED NOT DETECTED Final   Bordetella pertussis NOT DETECTED NOT DETECTED Final   Bordetella Parapertussis NOT DETECTED NOT DETECTED Final   Chlamydophila pneumoniae NOT DETECTED NOT DETECTED Final   Mycoplasma pneumoniae NOT DETECTED NOT DETECTED Final    Comment: Performed at [facility], [place]. [address] [place], [place] [address]  Resp panel by RT-PCR (RSV, Flu A&B, Covid) Anterior Nasal Swab     Status: None   Collection Time: 06/29/22  5:11 PM   Specimen: Anterior Nasal Swab  Result Value Ref Range Status   SARS Coronavirus 2 by RT PCR NEGATIVE NEGATIVE Final    Comment: (NOTE) SARS-CoV-2 target nucleic acids are NOT DETECTED.  The  SARS-CoV-2 RNA is generally detectable in upper respiratory specimens during the acute phase of infection. The lowest concentration of SARS-CoV-2 viral copies this assay can detect is 138 copies/mL. A negative result does not preclude SARS-Cov-2 infection and should not be used as the sole basis for treatment or other patient management decisions. A negative result may occur with  improper specimen collection/handling, submission of specimen other than nasopharyngeal swab, presence of viral mutation(s) within the areas targeted by this assay, and inadequate number of viral copies(<138 copies/mL). A negative result must be combined with clinical observations, patient history, and epidemiological information. The expected result is Negative.  Fact Sheet for Patients:  EntrepreneurPulse.com.au  Fact Sheet for Healthcare  Providers:  IncredibleEmployment.be  This test is no t yet approved or cleared by the [place] and  has been authorized for detection and/or diagnosis of SARS-CoV-2 by FDA under an Emergency Use Authorization (EUA). This EUA will remain  in effect (meaning this test can be used) for the duration of the COVID-19 declaration under Section 564(b)(1) of the Act, 21 U.S.C.section 360bbb-3(b)(1), unless the authorization is terminated  or revoked sooner.       Influenza A by PCR NEGATIVE NEGATIVE Final   Influenza B by PCR NEGATIVE NEGATIVE Final    Comment: (NOTE) The Xpert Xpress SARS-CoV-2/FLU/RSV plus assay is intended as an aid in the diagnosis of influenza from Nasopharyngeal swab specimens and should not be used as a sole basis for treatment. Nasal washings and aspirates are unacceptable for Xpert Xpress SARS-CoV-2/FLU/RSV testing.  Fact Sheet for Patients: EntrepreneurPulse.com.au  Fact Sheet for Healthcare Providers: IncredibleEmployment.be  This test is not yet approved or  cleared by the [place] FDA and has been authorized for detection and/or diagnosis of SARS-CoV-2 by FDA under an Emergency Use Authorization (EUA). This EUA will remain in effect (meaning this test can be used) for the duration of the COVID-19 declaration under Section 564(b)(1) of the Act, 21 U.S.C. section 360bbb-3(b)(1), unless the authorization is terminated or revoked.     Resp Syncytial Virus by PCR NEGATIVE NEGATIVE Final    Comment: (NOTE) Fact Sheet for Patients: EntrepreneurPulse.com.au  Fact Sheet for Healthcare Providers: IncredibleEmployment.be  This test is not yet approved or cleared by the [place] FDA and has been authorized for detection and/or diagnosis of SARS-CoV-2 by FDA under an Emergency Use Authorization (EUA). This EUA will remain in effect (meaning this test can be used) for the duration of the COVID-19 declaration under Section 564(b)(1) of the Act, 21 U.S.C. section 360bbb-3(b)(1), unless the authorization is terminated or revoked.  Performed at [facility], [address] [place], [place] [address]      Radiology Studies: No results found.   Marzetta Board, MD, PhD [facility]  Between 7 am - 7 pm I am available, please contact me via Amion (for emergencies) or Securechat (non urgent messages)  Between 7 pm - 7 am I am not available, please contact night coverage MD/APP via Amion

## 2022-07-06 DIAGNOSIS — J9601 Acute respiratory failure with hypoxia: Secondary | ICD-10-CM | POA: Diagnosis not present

## 2022-07-06 LAB — BASIC METABOLIC PANEL
Anion gap: 3 — ABNORMAL LOW (ref 5–15)
BUN: 23 mg/dL (ref 8–23)
CO2: 34 mmol/L — ABNORMAL HIGH (ref 22–32)
Calcium: 8.4 mg/dL — ABNORMAL LOW (ref 8.9–10.3)
Chloride: 98 mmol/L (ref 98–111)
Creatinine, Ser: 0.78 mg/dL (ref 0.44–1.00)
GFR, Estimated: >60 mL/min (ref >60)
Glucose, Bld: 76 mg/dL (ref 70–99)
Potassium: 4.6 mmol/L (ref 3.5–5.1)
Sodium: 135 mmol/L (ref 135–145)

## 2022-07-06 LAB — CBC
HCT: 26.2 % — ABNORMAL LOW (ref 36.0–46.0)
Hemoglobin: 8.5 g/dL — ABNORMAL LOW (ref 12.0–15.0)
MCH: 31 pg (ref 26.0–34.0)
MCHC: 32.4 g/dL (ref 30.0–36.0)
MCV: 95.6 fL (ref 80.0–100.0)
Platelets: 300 10*3/uL (ref 150–400)
RBC: 2.74 MIL/uL — ABNORMAL LOW (ref 3.87–5.11)
RDW: 15.6 % — ABNORMAL HIGH (ref 11.5–15.5)
WBC: 13 10*3/uL — ABNORMAL HIGH (ref 4.0–10.5)
nRBC: 0 % (ref 0.0–0.2)

## 2022-07-06 LAB — GLUCOSE, CAPILLARY
Glucose-Capillary: 78 mg/dL (ref 70–99)
Glucose-Capillary: 71 mg/dL (ref 70–99)

## 2022-07-06 LAB — MAGNESIUM: Magnesium: 1.7 mg/dL (ref 1.7–2.4)

## 2022-07-06 MED ORDER — ALBUTEROL SULFATE HFA 108 (90 BASE) MCG/ACT IN AERS: 2.0000 | INHALATION_SPRAY | Freq: Four times a day (QID) | RESPIRATORY_TRACT | 2 refills | Status: AC | PRN

## 2022-07-06 MED ORDER — METRONIDAZOLE 500 MG PO TABS: 500.0000 mg | ORAL_TABLET | Freq: Two times a day (BID) | ORAL | 0 refills | Status: AC

## 2022-07-06 MED ORDER — CIPROFLOXACIN HCL 500 MG PO TABS: 500.0000 mg | ORAL_TABLET | Freq: Two times a day (BID) | ORAL | 0 refills | Status: AC

## 2022-07-06 MED ORDER — INFLUENZA VAC A&B SA ADJ QUAD 0.5 ML IM PRSY: 0.5000 mL | PREFILLED_SYRINGE | INTRAMUSCULAR | Status: DC

## 2022-07-06 MED ORDER — PREDNISONE 20 MG PO TABS: 20.0000 mg | ORAL_TABLET | Freq: Every day | ORAL | 0 refills | Status: AC

## 2022-07-06 MED ORDER — INFLUENZA VAC A&B SA ADJ QUAD 0.5 ML IM PRSY
0.5000 mL | PREFILLED_SYRINGE | Freq: Once | INTRAMUSCULAR | Status: AC
  Administered 2022-07-06: 0.5 mL via INTRAMUSCULAR
  Filled 2022-07-06: qty 0.5

## 2022-07-06 MED ORDER — SACCHAROMYCES BOULARDII 250 MG PO CAPS: 250.0000 mg | ORAL_CAPSULE | Freq: Two times a day (BID) | ORAL | 0 refills | Status: AC

## 2022-07-06 MED ORDER — LOPERAMIDE HCL 2 MG PO CAPS: 2.0000 mg | ORAL_CAPSULE | ORAL | 0 refills | Status: DC | PRN

## 2022-07-06 MED ORDER — OXYCODONE HCL 5 MG PO TABS: 5.0000 mg | ORAL_TABLET | Freq: Three times a day (TID) | ORAL | 0 refills | Status: AC | PRN

## 2022-07-06 MED ORDER — CEFADROXIL 500 MG PO CAPS: 500.0000 mg | ORAL_CAPSULE | Freq: Two times a day (BID) | ORAL | 0 refills | Status: AC

## 2022-07-06 NOTE — Discharge Summary (Signed)
Physician Discharge Summary  Regina Bernard NLG:[id number] DOB: [birth year] DOA: 06/28/2022  PCP: Juluis Pitch, MD  Admit date: 06/28/2022 Discharge date: 07/06/2022  Admitted From: home Disposition:  home  Recommendations for Outpatient Follow-up:  Follow up with PCP in 1-2 weeks Please obtain BMP/CBC in one week  Home Health: PT, OT Equipment/Devices: home O2  Discharge Condition: stable CODE STATUS: Full code Diet Orders (From admission, onward)     Start     Ordered   06/30/22 0550  Diet regular Room service appropriate? Yes; Fluid consistency: Thin  Diet effective now       Question Answer Comment  Room service appropriate? Yes   Fluid consistency: Thin      06/30/22 0549            HPI: Per admitting MD, [name] is a pleasant 66 year old female with a history of COPD and liver cirrhosis (History of EtOH use and HCV infection) who is presenting to the hospital with shortness of breath and failure to thrive. She reports her symptoms started around 2 weeks ago with increased fatigue, lethargy, and shortness of breath. She also developed a cough that was productive of greenish sputum. She also reports fevers for the past week up to 102 as well as chills. She has been feeling increasingly short of breath and that has lead to some chest discomfort. She does not have any abdominal pain or increased abdominal swelling. Her lower extremities are not swollen and there has been no redness. She doesn't think that she was exposed to any illness. Her husband had been caring for her over the past two weeks. She tells me that she'd been sleeping more secondary to her illness. Denies any aspiration. There was confusion about hematemesis but she denied, no other GI symptoms reported. She follows with Dr. Raul Del at [facility] pulmonology and is maintained on inhalers and recently prescribed three courses of azithromycin and steroids for a COPD exacerbation (12/11, 12/15, 12/19). She  has a long standing smoking history (at least 50 pack years) as well as previous history of drug and alcohol use. In the ED, she was noted to be hypotensive with significant leukocytosis. She was also hypoxic and had increased work of breathing, prompting initiation of BiPAP, with improvement in respiratory status. She was also started on broad spectrum antibiotics and a CT scan of the chest was performed. She was started on nor-epinephrine peripherally and admitted to the ICU for further care.  Hospital Course / Discharge diagnoses: Principal Problem:   Acute hypoxic respiratory failure (HCC) Active Problems:   Liver cirrhosis (HCC)   COPD with acute exacerbation (HCC)   Aspiration pneumonia (HCC)   Septic shock (HCC)   AKI (acute kidney injury) ([place])   Hyponatremia   Necrotizing pneumonia (HCC)   Malnutrition of moderate degree   Principal problem Septic shock due to necrotizing pneumonia left upper lobe -patient initially required admission to the ICU for vasopressors.  She was placed on broad-spectrum antibiotics.  ID consulted.  She was maintained on meropenem, and eventually transition to a combination of ciprofloxacin, cefadroxil and metronidazole.  Sputum cultures showed GPC in chains, few yeast and gram-negative rods which speciated to Pseudomonas.  She has improved, has returned to baseline, white count is improving and will be discharged home in stable condition.  She will be on 3 more weeks of antibiotics as per ID, and will have outpatient follow-up.  Active problems Advanced COPD with exacerbation, chronic hypoxic respiratory failure -she  is on 2 L at home.  Initially was placed on steroids in the ICU, tapering down and will have 5 days left at the time of discharge.  Liver Cirrhosis - HCV treated- undetectable VL. Stable with no transaminitis and normal total bilirubin. History of UGIB - Has a history of PUD and H. Pylori with previous GI bleed. Hemoglobin is stable Hyponatremia  -Secondary to cirrhosis and associated hypoalbuminemia-sodium currently stable Chronic back pain -worsened during hospitalization, continue gabapentin, and she was given a short course of oxycodone since her PCPs office is closed over the holidays. Anxiety/Depression -continue Duloxetine, Lamictal HTN - Continue home Toprol   Discharge Instructions   Allergies as of 07/06/2022       Reactions   Penicillins Swelling, Rash   Has patient had a PCN reaction causing immediate rash, facial/tongue/throat swelling, SOB or lightheadedness with hypotension: Yes Has patient had a PCN reaction causing severe rash involving mucus membranes or skin necrosis: No Has patient had a PCN reaction that required hospitalization: No Has patient had a PCN reaction occurring within the last 10 years: No If all of the above answers are "NO", then may proceed with Cephalosporin use.        Medication List     STOP taking these medications    diazepam 2 MG tablet Commonly known as: VALIUM   HYDROcodone-acetaminophen 5-325 MG tablet Commonly known as: NORCO/VICODIN       TAKE these medications    albuterol (2.5 MG/3ML) 0.083% nebulizer solution Commonly known as: PROVENTIL Take 2.5 mg by nebulization every 6 (six) hours as needed for wheezing.   cefadroxil 500 MG capsule Commonly known as: DURICEF Take 1 capsule (500 mg total) by mouth 2 (two) times daily for 21 days.   ciprofloxacin 500 MG tablet Commonly known as: CIPRO Take 1 tablet (500 mg total) by mouth 2 (two) times daily for 21 days.   DULoxetine 30 MG capsule Commonly known as: CYMBALTA Take 1 capsule (30 mg total) by mouth 2 (two) times daily.   gabapentin 300 MG capsule Commonly known as: NEURONTIN Take 1 capsule (300 mg total) by mouth 3 (three) times daily.   lamoTRIgine 100 MG tablet Commonly known as: LAMICTAL Take 100 mg by mouth daily.   loperamide 2 MG capsule Commonly known as: IMODIUM Take 1 capsule (2 mg total)  by mouth as needed for diarrhea or loose stools.   metoprolol succinate 100 MG 24 hr tablet Commonly known as: TOPROL-XL Take 100 mg by mouth daily. Take with or immediately following a meal.   metroNIDAZOLE 500 MG tablet Commonly known as: FLAGYL Take 1 tablet (500 mg total) by mouth every 12 (twelve) hours for 21 days.   Multi-Vitamins Tabs Take 1 tablet by mouth daily.   nicotine 21 mg/24hr patch Commonly known as: NICODERM CQ - dosed in mg/24 hours Place 1 patch (21 mg total) onto the skin daily.   omeprazole 20 MG capsule Commonly known as: PRILOSEC Take 40 mg by mouth every morning.   oxyCODONE 5 MG immediate release tablet Commonly known as: Oxy IR/ROXICODONE Take 1 tablet (5 mg total) by mouth every 8 (eight) hours as needed for up to 5 days for moderate pain or severe pain.   OXYGEN Inhale 2 L into the lungs at bedtime and may repeat dose one time if needed.   predniSONE 20 MG tablet Commonly known as: DELTASONE Take 1 tablet (20 mg total) by mouth daily with breakfast for 5 days.   QUEtiapine 25  MG tablet Commonly known as: SEROQUEL Take 25 mg by mouth at bedtime as needed (sleep).   saccharomyces boulardii 250 MG capsule Commonly known as: FLORASTOR Take 1 capsule (250 mg total) by mouth 2 (two) times daily.   Senexon-S 8.6-50 MG tablet Generic drug: senna-docusate Take 2 tablets by mouth 2 (two) times daily.   Trelegy Ellipta 100-62.5-25 MCG/ACT Aepb Generic drug: Fluticasone-Umeclidin-Vilant Inhale 1 puff into the lungs daily.               Durable Medical Equipment  (From admission, onward)           Start     Ordered   07/03/22 1121  For home use only DME 3 n 1  Once        07/03/22 1121           Consultations: Pulmonology ID  Procedures/Studies:  DG Chest Port 1 View  Result Date: 06/28/2022 CLINICAL DATA:  Central line placement EXAM: PORTABLE CHEST 1 VIEW COMPARISON:  Same-day chest radiographs and CT FINDINGS: Left  chest vascular catheter, with tip near the superior cavoatrial junction. Cardiac contours partially obscured. Very dense consolidation of the left lung with cavitary component as seen by prior CT. Osseous structures unremarkable. IMPRESSION: 1. Left chest vascular catheter, with tip near the superior cavoatrial junction. 2. Very dense consolidation of the left lung with cavitary component as seen by prior CT. Electronically Signed   By: Delanna Ahmadi M.D.   On: 06/28/2022 22:05   CT Angio Chest PE W/Cm &/Or Wo Cm  Result Date: 06/28/2022 CLINICAL DATA:  PE suspected, COPD, shortness of breath EXAM: CT ANGIOGRAPHY CHEST WITH CONTRAST TECHNIQUE: Multidetector CT imaging of the chest was performed using the standard protocol during bolus administration of intravenous contrast. Multiplanar CT image reconstructions and MIPs were obtained to evaluate the vascular anatomy. RADIATION DOSE REDUCTION: This exam was performed according to the departmental dose-optimization program which includes automated exposure control, adjustment of the mA and/or kV according to patient size and/or use of iterative reconstruction technique. CONTRAST:  75m OMNIPAQUE IOHEXOL 350 MG/ML SOLN COMPARISON:  05/15/2022 FINDINGS: Cardiovascular: Satisfactory opacification of the pulmonary arteries to the segmental level. No evidence of pulmonary embolism. Normal heart size. Left coronary artery calcifications. No pericardial effusion. Aortic atherosclerosis. Mediastinum/Nodes: Numerous newly enlarged mediastinal and hilar lymph nodes. Thyroid gland, trachea, and esophagus demonstrate no significant findings. Lungs/Pleura: Severe centrilobular emphysema and diffuse bilateral bronchial wall thickening. New, very extensive consolidation of the left upper lobe and lingula, with a large air and fluid containing cavitary component within the lingula measuring at least 7.9 x 5.8 cm (series 6, image 64). No pleural effusion or pneumothorax. Upper  Abdomen: No acute abnormality. Coarse, nodular cirrhotic morphology of the liver. Musculoskeletal: No chest wall abnormality. No acute osseous findings. Unchanged severe wedge deformity of L1 with severe, destructive L1-L2 arthrosis and vertebral cement augmentation of L1 and L2. Review of the MIP images confirms the above findings. IMPRESSION: 1. Negative examination for pulmonary embolism. 2. New, very extensive consolidation of the left upper lobe and lingula, with a large air and fluid containing cavitary component within the lingula measuring at least 7.9 x 5.8 cm. Findings are consistent with cavitary infection. Mass is relatively excluded given very rapid development of this finding in comparison to recent examination dated 05/15/2022. 3. Numerous newly enlarged mediastinal and hilar lymph nodes, likely reactive. 4. Severe emphysema and diffuse bilateral bronchial wall thickening. 5. Coronary artery disease. 6. Cirrhosis. Aortic Atherosclerosis (ICD10-I70.0) and  Emphysema (ICD10-J43.9). Electronically Signed   By: Delanna Ahmadi M.D.   On: 06/28/2022 14:41   DG Chest Port 1 View  Result Date: 06/28/2022 CLINICAL DATA:  Sepsis EXAM: PORTABLE CHEST 1 VIEW COMPARISON:  Radiograph 10/28/2021, chest CT 05/15/2022 FINDINGS: Obscured left aspect of the cardiomediastinal silhouette. There is a new large opacity which has an obtuse angle with the pleural at the superior margin, spanning the mid to lower lung, relatively sparing the apex and lung base. There is pleuroparenchymal scarring and bronchiectasis in the left apex. Background of emphysema. Unchanged left apical pleuroparenchymal scarring and bronchiectasis. Persistent reticulonodular opacities in the periphery of the right upper lung. No evidence of pneumothorax. Scoliosis. No acute osseous abnormality. IMPRESSION: New large opacity in the mid to lower left lung, concerning for pneumonia with suspicion for loculated pleural effusion. Recommend chest CT  with contrast. Electronically Signed   By: Maurine Simmering M.D.   On: 06/28/2022 13:06     Subjective: - no chest pain, shortness of breath, no abdominal pain, nausea or vomiting.   Discharge Exam: BP (!) 156/82 (BP Location: Left Arm)   Pulse 65   Temp 97.9 F (36.6 C)   Resp 18   Ht 5' 3.5" (1.613 m)   Wt 61.7 kg   SpO2 100%   BMI 23.72 kg/m   General: Pt is alert, awake, not in acute distress Cardiovascular: RRR, S1/S2 +, no rubs, no gallops Respiratory: CTA bilaterally Abdominal: Soft, NT, ND, bowel sounds + Extremities: no edema, no cyanosis    The results of significant diagnostics from this hospitalization (including imaging, microbiology, ancillary and laboratory) are listed below for reference.     Microbiology: Recent Results (from the past 240 hour(s))  Urine Culture     Status: Abnormal   Collection Time: 06/28/22 10:05 AM   Specimen: Urine, Clean Catch  Result Value Ref Range Status   Specimen Description   Final    URINE, CLEAN CATCH Performed at [facility], [address], [place], [place] [address]    Special Requests   Final    NONE Performed at [facility], [address] [place], [place] [address]    Culture (A)  Final    40,000 COLONIES/mL ENTEROCOCCUS FAECIUM VANCOMYCIN RESISTANT ENTEROCOCCUS ISOLATED    Report Status 07/02/2022 FINAL  Final   Organism ID, Bacteria ENTEROCOCCUS FAECIUM (A)  Final      Susceptibility   Enterococcus faecium - MIC*    AMPICILLIN >=32 RESISTANT Resistant     NITROFURANTOIN 64 INTERMEDIATE Intermediate     VANCOMYCIN >=32 RESISTANT Resistant     GENTAMICIN SYNERGY SENSITIVE Sensitive     LINEZOLID 2 SENSITIVE Sensitive     * 40,000 COLONIES/mL ENTEROCOCCUS FAECIUM  Culture, blood (Routine x 2)     Status: None   Collection Time: 06/28/22 12:42 PM   Specimen: BLOOD  Result Value Ref Range Status   Specimen Description BLOOD  LEFT FOREARM  Final   Special Requests   Final    BOTTLES  DRAWN AEROBIC AND ANAEROBIC Blood Culture adequate volume   Culture   Final    NO GROWTH 5 DAYS Performed at [facility], [address] [place], [place] [address]    Report Status 07/03/2022 FINAL  Final  Culture, blood (Routine x 2)     Status: None   Collection Time: 06/28/22  3:16 PM   Specimen: BLOOD  Result Value Ref Range Status   Specimen Description BLOOD BLOOD LEFT ARM  Final   Special Requests   Final    BOTTLES DRAWN AEROBIC AND ANAEROBIC Blood Culture results may not be optimal due to an inadequate volume of blood received in culture bottles   Culture   Final    NO GROWTH 5 DAYS Performed at [facility], [address] [place], [place] [address]    Report Status 07/03/2022 FINAL  Final  Culture, Respiratory w Gram Stain (tracheal aspirate)     Status: None   Collection Time: 06/28/22  3:16 PM   Specimen: SPU; Respiratory  Result Value Ref Range Status   Specimen Description   Final    SPUTUM Performed at [facility], [address] [place], [place] [address]    Special Requests   Final    SPUTUM Performed at [facility], [address] [place], [place] [address]    Gram Stain   Final    ABUNDANT WBC PRESENT, PREDOMINANTLY PMN ABUNDANT GRAM POSITIVE COCCI IN CHAINS FEW YEAST FEW GRAM NEGATIVE RODS Performed at [facility], [place] [address] [place], [place] [address]    Culture RARE PSEUDOMONAS AERUGINOSA  Final   Report Status 07/01/2022 FINAL  Final   Organism ID, Bacteria PSEUDOMONAS AERUGINOSA  Final      Susceptibility   Pseudomonas aeruginosa - MIC*    CEFTAZIDIME 2 SENSITIVE Sensitive     CIPROFLOXACIN <=0.25 SENSITIVE Sensitive     GENTAMICIN 2 SENSITIVE Sensitive     IMIPENEM 2 SENSITIVE Sensitive     PIP/TAZO <=4 SENSITIVE Sensitive     CEFEPIME 1 SENSITIVE Sensitive     * RARE PSEUDOMONAS AERUGINOSA  MRSA Next Gen by PCR, Nasal     Status: None   Collection Time: 06/28/22  4:46 PM    Specimen: Nasal Mucosa; Nasal Swab  Result Value Ref Range Status   MRSA by PCR Next Gen NOT DETECTED NOT DETECTED Final    Comment: (NOTE) The GeneXpert MRSA Assay (FDA approved for NASAL specimens only), is one component of a comprehensive MRSA colonization surveillance program. It is not intended to diagnose MRSA infection nor to guide or monitor treatment for MRSA infections. Test performance is not FDA approved in patients less than 55 years old. Performed at [facility], [place], [place] [address]   Acid Fast Smear (AFB)     Status: None   Collection Time: 06/29/22  1:25 AM  Result Value Ref Range Status   AFB Specimen Processing Concentration  Final   Acid Fast Smear Negative  Final    Comment: (NOTE) Performed At: [facility] [place], [place] [id number] Rush Wareing MD CL:[id number]    Source (AFB) EXPECTORATED SPUTUM  Corrected    Comment: Performed at [facility], [address] [place], [place] [address] CORRECTED ON 12/23 AT 0130: PREVIOUSLY REPORTED AS BDC   Expectorated Sputum Assessment w Gram Stain, Rflx to Resp Cult     Status: None   Collection Time: 06/29/22  1:25 AM   Specimen: Expectorated Sputum  Result Value Ref Range Status   Specimen Description EXPECTORATED SPUTUM  Final   Special Requests NONE  Final   Sputum evaluation   Final    THIS SPECIMEN IS ACCEPTABLE FOR SPUTUM CULTURE Performed at [facility], [address] [place], [place] [address]    Report Status 06/29/2022 FINAL  Final  Culture, Respiratory w Gram Stain     Status: None   Collection Time: 06/29/22  1:25 AM  Result Value Ref Range Status   Specimen Description   Final    EXPECTORATED SPUTUM Performed at [facility], [address] [place], [place] [address]    Special Requests   Final    NONE Reflexed from [id number] Performed at [facility], [address] [place], [place] [address]     Gram Stain   Final    ABUNDANT WBC PRESENT, PREDOMINANTLY PMN MODERATE GRAM POSITIVE COCCI IN PAIRS IN CHAINS FEW GRAM NEGATIVE RODS FEW YEAST    Culture   Final    ABUNDANT Consistent with normal respiratory flora. No Pseudomonas species isolated Performed at [facility] [address], [place], [place] [address]    Report Status 07/01/2022 FINAL  Final  Acid Fast Smear (AFB)     Status: None   Collection Time: 06/29/22 10:03 AM   Specimen: Expectorated Sputum  Result Value Ref Range Status   AFB Specimen Processing Concentration  Final   Acid Fast Smear Negative  Final    Comment: (NOTE) Performed At: [facility] [address] [place], [place] [id number] Rush Mussell MD LM:[id number]    Source (AFB) BRONCHIAL ALVEOLAR LAVAGE  Final    Comment: Performed at [facility], [place], [place] [address]  Acid Fast Smear (AFB)     Status: None   Collection Time: 06/29/22  2:45 PM   Specimen: Expectorated Sputum  Result Value Ref Range Status   AFB Specimen Processing Concentration  Final   Acid Fast Smear Negative  Final    Comment: (NOTE) Performed At: [facility] [address] [place], [place] [id number] Rush Irby MD PQ:[id number]    Source (AFB) SPUTUM  Final    Comment: Performed at [facility], [address] [place], [place] [address]  Respiratory (~20 pathogens) panel by PCR     Status: None   Collection Time: 06/29/22  5:11 PM   Specimen: Nasopharyngeal Swab; Respiratory  Result Value Ref Range Status   Adenovirus NOT DETECTED NOT DETECTED Final   Coronavirus 229E NOT DETECTED NOT DETECTED Final    Comment: (NOTE) The Coronavirus on the Respiratory Panel, DOES NOT test for the novel  Coronavirus (2019 nCoV)    Coronavirus HKU1 NOT DETECTED NOT DETECTED Final   Coronavirus NL63 NOT DETECTED NOT DETECTED Final   Coronavirus OC43 NOT DETECTED NOT DETECTED Final   Metapneumovirus NOT DETECTED  NOT DETECTED Final   Rhinovirus / Enterovirus NOT DETECTED NOT DETECTED Final   Influenza A NOT DETECTED NOT DETECTED Final   Influenza B NOT DETECTED NOT DETECTED Final   Parainfluenza Virus 1 NOT DETECTED NOT DETECTED Final   Parainfluenza Virus 2 NOT DETECTED NOT DETECTED Final   Parainfluenza Virus 3 NOT DETECTED NOT DETECTED Final   Parainfluenza Virus 4 NOT DETECTED NOT DETECTED Final   Respiratory Syncytial Virus NOT DETECTED NOT DETECTED Final   Bordetella pertussis NOT DETECTED NOT DETECTED Final   Bordetella Parapertussis NOT DETECTED NOT DETECTED Final   Chlamydophila pneumoniae NOT DETECTED NOT DETECTED Final   Mycoplasma pneumoniae NOT DETECTED NOT DETECTED Final    Comment: Performed at [facility], [place], [place], [place] [address]  Resp panel by RT-PCR (RSV, Flu A&B, Covid) Anterior Nasal Swab     Status: None   Collection Time: 06/29/22  5:11 PM   Specimen: Anterior Nasal Swab  Result Value Ref Range Status   SARS Coronavirus 2 by RT PCR NEGATIVE NEGATIVE  Final    Comment: (NOTE) SARS-CoV-2 target nucleic acids are NOT DETECTED.  The SARS-CoV-2 RNA is generally detectable in upper respiratory specimens during the acute phase of infection. The lowest concentration of SARS-CoV-2 viral copies this assay can detect is 138 copies/mL. A negative result does not preclude SARS-Cov-2 infection and should not be used as the sole basis for treatment or other patient management decisions. A negative result may occur with  improper specimen collection/handling, submission of specimen other than nasopharyngeal swab, presence of viral mutation(s) within the areas targeted by this assay, and inadequate number of viral copies(<138 copies/mL). A negative result must be combined with clinical observations, patient history, and epidemiological information. The expected result is Negative.  Fact Sheet for Patients:  EntrepreneurPulse.com.au  Fact  Sheet for Healthcare Providers:  IncredibleEmployment.be  This test is no t yet approved or cleared by the [place] FDA and  has been authorized for detection and/or diagnosis of SARS-CoV-2 by FDA under an Emergency Use Authorization (EUA). This EUA will remain  in effect (meaning this test can be used) for the duration of the COVID-19 declaration under Section 564(b)(1) of the Act, 21 U.S.C.section 360bbb-3(b)(1), unless the authorization is terminated  or revoked sooner.       Influenza A by PCR NEGATIVE NEGATIVE Final   Influenza B by PCR NEGATIVE NEGATIVE Final    Comment: (NOTE) The Xpert Xpress SARS-CoV-2/FLU/RSV plus assay is intended as an aid in the diagnosis of influenza from Nasopharyngeal swab specimens and should not be used as a sole basis for treatment. Nasal washings and aspirates are unacceptable for Xpert Xpress SARS-CoV-2/FLU/RSV testing.  Fact Sheet for Patients: EntrepreneurPulse.com.au  Fact Sheet for Healthcare Providers: IncredibleEmployment.be  This test is not yet approved or cleared by the [place] FDA and has been authorized for detection and/or diagnosis of SARS-CoV-2 by FDA under an Emergency Use Authorization (EUA). This EUA will remain in effect (meaning this test can be used) for the duration of the COVID-19 declaration under Section 564(b)(1) of the Act, 21 U.S.C. section 360bbb-3(b)(1), unless the authorization is terminated or revoked.     Resp Syncytial Virus by PCR NEGATIVE NEGATIVE Final    Comment: (NOTE) Fact Sheet for Patients: EntrepreneurPulse.com.au  Fact Sheet for Healthcare Providers: IncredibleEmployment.be  This test is not yet approved or cleared by the [place] FDA and has been authorized for detection and/or diagnosis of SARS-CoV-2 by FDA under an Emergency Use Authorization (EUA). This EUA will remain in effect  (meaning this test can be used) for the duration of the COVID-19 declaration under Section 564(b)(1) of the Act, 21 U.S.C. section 360bbb-3(b)(1), unless the authorization is terminated or revoked.  Performed at [facility], [address] [place], [place] [address]      Labs: Basic Metabolic Panel: Recent Labs  Lab 06/30/22 0454 07/01/22 6045 07/02/22 0755 07/03/22 0655 07/04/22 0431 07/05/22 0441 07/06/22 0634  NA 132*   < > 135 134* 132* 133* 135  K 3.9   < > 3.9 4.0 4.3 4.1 4.6  CL 102   < > 103 101 99 99 98  CO2 24   < > '25 28 29 28 '$ 34*  GLUCOSE 105*   < > 68* 82 89 93 76  BUN 25*   < > '17 15 19 21 23  '$ CREATININE 1.01*   < > 0.86 0.82 0.84 0.82 0.78  CALCIUM 7.9*   < > 8.2* 8.3* 8.1* 8.0* 8.4*  MG 2.1   < >  1.6* 1.9 1.6* 1.8 1.7  PHOS 3.9  --   --   --   --   --   --    < > = values in this interval not displayed.   Liver Function Tests: Recent Labs  Lab 07/02/22 0754  AST 24  ALT 14  ALKPHOS 162*  BILITOT 1.0  PROT 5.7*  ALBUMIN 1.8*   CBC: Recent Labs  Lab 06/29/22 1445 06/30/22 0454 07/01/22 0624 07/02/22 0755 07/03/22 0655 07/04/22 0431 07/05/22 0441 07/06/22 0634  WBC 29.7* 26.9*   < > 20.3* 15.3* 14.1* 14.8* 13.0*  NEUTROABS 28.4* 25.1*  --   --   --   --   --   --   HGB 8.0* 7.6*   < > 8.0* 8.2* 8.6* 8.0* 8.5*  HCT 24.1* 22.8*   < > 23.8* 24.3* 26.1* 23.3* 26.2*  MCV 97.2 95.8   < > 93.7 93.5 93.5 93.6 95.6  PLT 398 348   < > 320 284 311 306 300   < > = values in this interval not displayed.   CBG: Recent Labs  Lab 07/05/22 1212 07/05/22 1654 07/05/22 2157 07/06/22 0653 07/06/22 0747  GLUCAP 71 213* 186* 78 71   Hgb A1c No results for input(s): "HGBA1C" in the last 72 hours. Lipid Profile No results for input(s): "CHOL", "HDL", "LDLCALC", "TRIG", "CHOLHDL", "LDLDIRECT" in the last 72 hours. Thyroid function studies No results for input(s): "TSH", "T4TOTAL", "T3FREE", "THYROIDAB" in the last 72 hours.  Invalid  input(s): "FREET3" Urinalysis    Component Value Date/Time   COLORURINE YELLOW (A) 06/28/2022 1005   APPEARANCEUR HAZY (A) 06/28/2022 1005   APPEARANCEUR Hazy 03/08/2013 1341   LABSPEC 1.030 06/28/2022 1005   LABSPEC 1.019 03/08/2013 1341   PHURINE 5.0 06/28/2022 1005   GLUCOSEU NEGATIVE 06/28/2022 1005   GLUCOSEU Negative 03/08/2013 1341   HGBUR NEGATIVE 06/28/2022 1005   BILIRUBINUR NEGATIVE 06/28/2022 1005   BILIRUBINUR Negative 03/08/2013 1341   KETONESUR NEGATIVE 06/28/2022 1005   PROTEINUR NEGATIVE 06/28/2022 1005   NITRITE NEGATIVE 06/28/2022 1005   LEUKOCYTESUR NEGATIVE 06/28/2022 1005   LEUKOCYTESUR Negative 03/08/2013 1341    FURTHER DISCHARGE INSTRUCTIONS:   Get Medicines reviewed and adjusted: Please take all your medications with you for your next visit with your Primary MD   Laboratory/radiological data: Please request your Primary MD to go over all hospital tests and procedure/radiological results at the follow up, please ask your Primary MD to get all Hospital records sent to his/her office.   In some cases, they will be blood work, cultures and biopsy results pending at the time of your discharge. Please request that your primary care M.D. goes through all the records of your hospital data and follows up on these results.   Also Note the following: If you experience worsening of your admission symptoms, develop shortness of breath, life threatening emergency, suicidal or homicidal thoughts you must seek medical attention immediately by calling 911 or calling your MD immediately  if symptoms less severe.   You must read complete instructions/literature along with all the possible adverse reactions/side effects for all the Medicines you take and that have been prescribed to you. Take any new Medicines after you have completely understood and accpet all the possible adverse reactions/side effects.    Do not drive when taking Pain medications or sleeping medications  (Benzodaizepines)   Do not take more than prescribed Pain, Sleep and Anxiety Medications. It is not advisable to combine anxiety,sleep and  pain medications without talking with your primary care practitioner   Special Instructions: If you have smoked or chewed Tobacco  in the last 2 yrs please stop smoking, stop any regular Alcohol  and or any Recreational drug use.   Wear Seat belts while driving.   Please note: You were cared for by a hospitalist during your hospital stay. Once you are discharged, your primary care physician will handle any further medical issues. Please note that NO REFILLS for any discharge medications will be authorized once you are discharged, as it is imperative that you return to your primary care physician (or establish a relationship with a primary care physician if you do not have one) for your post hospital discharge needs so that they can reassess your need for medications and monitor your lab values.  Time coordinating discharge: 40 minutes  SIGNED:  Marzetta Board, MD, PhD 07/06/2022, 8:09 AM

## 2022-07-06 NOTE — Progress Notes (Signed)
Received MD order to discharge patient to home.  I reviewed discharge instructions, prescriptions, homes meds and follow up appointments with patient and patient verbalized understanding

## 2022-07-07 LAB — BLOOD GAS, VENOUS
Acid-base deficit: 3.9 mmol/L — ABNORMAL HIGH (ref 0.0–2.0)
Bicarbonate: 25.4 mmol/L (ref 20.0–28.0)
Patient temperature: 37
pCO2, Ven: 65 mmHg — ABNORMAL HIGH (ref 44–60)
pH, Ven: 7.2 — ABNORMAL LOW (ref 7.25–7.43)

## 2022-07-23 ENCOUNTER — Encounter: Payer: Self-pay | Admitting: Infectious Diseases

## 2022-07-23 ENCOUNTER — Ambulatory Visit
Admission: RE | Admit: 2022-07-23 | Discharge: 2022-07-23 | Disposition: A | Discharge status: Home / Self Care | Payer: Medicare Other | Source: Ambulatory Visit | Attending: Infectious Diseases | Admitting: Infectious Diseases

## 2022-07-23 ENCOUNTER — Ambulatory Visit: Payer: Medicare Other | Attending: Infectious Diseases | Admitting: Infectious Diseases

## 2022-07-23 VITALS — BP 130/82 | HR 77 | Temp 97.2°F | Ht 66.0 in | Wt 126.0 lb

## 2022-07-23 DIAGNOSIS — J189 Pneumonia, unspecified organism: Secondary | ICD-10-CM | POA: Diagnosis not present

## 2022-07-23 DIAGNOSIS — Z87891 Personal history of nicotine dependence: Secondary | ICD-10-CM | POA: Diagnosis not present

## 2022-07-23 DIAGNOSIS — J439 Emphysema, unspecified: Secondary | ICD-10-CM | POA: Insufficient documentation

## 2022-07-23 DIAGNOSIS — J850 Gangrene and necrosis of lung: Secondary | ICD-10-CM | POA: Diagnosis not present

## 2022-07-23 DIAGNOSIS — J4489 Other specified chronic obstructive pulmonary disease: Secondary | ICD-10-CM | POA: Insufficient documentation

## 2022-07-23 DIAGNOSIS — Z853 Personal history of malignant neoplasm of breast: Secondary | ICD-10-CM | POA: Insufficient documentation

## 2022-07-23 MED ORDER — NYSTATIN 100000 UNIT/ML MT SUSP: 5.0000 mL | Freq: Four times a day (QID) | OROMUCOSAL | 0 refills | Status: DC

## 2022-07-23 NOTE — Patient Instructions (Addendum)
You are here for follow up of the necrotizing pneumonia left lung- you will be completing 4 weeks of Po Cipro/cefadroxil and flagyl until 07/27/22 You have sore mouth- will give nystatin 1 tsp swish and swallow four times a day. Also take a probiotic 1 a day- [facility] or something similar to that. Today will get CXR

## 2022-07-23 NOTE — Progress Notes (Signed)
NAME: Regina Bernard  DOB: 1956-02-06  MRN: [id number]  Date/Time: 07/23/2022 11:42 AM   Subjective:   ? Regina Bernard is a 67 y.o. with a history of COPD, emphysema, compression fracture vertebrae with kyphoplasty disabled because of pain , h/o breast ca was in [facility] 06/28/22-07/06/22 For necrotizing pneumonia of left lung- sputum culture pseudomonas Sputum AFB X 3 smear neg- culture pending She wa son IV antibiotic and later discharged on cipro+ cefadroxil+flagyl to complete 4 weeks  until 07/27/22- Here for follow up- doing  better Cough better, breathing better But back pain persist 100% adherent to antibiotics  Past Medical History:  Diagnosis Date   Abnormal CT scan, chest    Anxiety    Asthma    Breast cancer ([place])    Breast CA- Right    Breast cancer ([place]) 06/18/2011   right breast cancer   COPD (chronic obstructive pulmonary disease) ([place])    Endometriosis    tx with vaginal hysterectomy   Fatty liver    Hepatitis C 1998   [facility] trial treatment in-patient study 2005. HCV 740, 05/20/11.   History of substance abuse ([place])    Heroine, cocaine, marijuana. States all of them. Heavy use 1995 to 2000. Occasional use before and after. None since 2006.    Hot flashes related to aromatase inhibitor therapy    Hypertension    Lung mass    Peripheral neuropathy    in hands and feet    Past Surgical History:  Procedure Laterality Date   BREAST LUMPECTOMY Right 2013   COLONOSCOPY WITH PROPOFOL N/A 03/20/2022   Procedure: COLONOSCOPY WITH PROPOFOL;  Surgeon: [name], MD;  Location: [facility] ENDOSCOPY;  Service: Gastroenterology;  Laterality: N/A;   ESOPHAGOGASTRODUODENOSCOPY (EGD) WITH PROPOFOL N/A 10/29/2021   Procedure: ESOPHAGOGASTRODUODENOSCOPY (EGD) WITH PROPOFOL;  Surgeon: [name], MD;  Location: [facility] ENDOSCOPY;  Service: Gastroenterology;  Laterality: N/A;   ESOPHAGOGASTRODUODENOSCOPY (EGD) WITH PROPOFOL N/A 02/14/2022   Procedure:  ESOPHAGOGASTRODUODENOSCOPY (EGD) WITH PROPOFOL;  Surgeon: [name], MD;  Location: [facility] ENDOSCOPY;  Service: Gastroenterology;  Laterality: N/A;  DRIVER 5 - [place]   Upper right leg benign tumor removed     VAGINAL HYSTERECTOMY  2001    Social History   Socioeconomic History   Marital status: Married    Spouse name: Not on file   Number of children: Not on file   Years of education: Not on file   Highest education level: Not on file  Occupational History   Not on file  Tobacco Use   Smoking status: Former    Types: E-cigarettes    Quit date: 09/2020    Years since quitting: 1.8   Smokeless tobacco: Never   Tobacco comments:    last cigeratte use 6 days ago (11/30/15)  Vaping Use   Vaping Use: Every day  Substance and Sexual Activity   Alcohol use: Yes    Comment: occ   Drug use: No   Sexual activity: Never  Other Topics Concern   Not on file  Social History Narrative   Not on file   Social Determinants of Health   Financial Resource Strain: Not on file  Food Insecurity: Not on file  Transportation Needs: Not on file  Physical Activity: Not on file  Stress: Not on file  Social Connections: Not on file  Intimate Partner Violence: Not on file    Family History  Problem Relation Age of Onset  Diabetes Maternal Grandmother    Lung cancer Father    Lung cancer Mother    Hypertension Mother    Heart disease Mother    Skin cancer Mother    Breast cancer Neg Hx    Allergies  Allergen Reactions   Penicillins Swelling and Rash    Has patient had a PCN reaction causing immediate rash, facial/tongue/throat swelling, SOB or lightheadedness with hypotension: Yes Has patient had a PCN reaction causing severe rash involving mucus membranes or skin necrosis: No Has patient had a PCN reaction that required hospitalization: No Has patient had a PCN reaction occurring within the last 10 years: No If all of the above answers are "NO",  then may proceed with Cephalosporin use.    I? Current Outpatient Medications  Medication Sig Dispense Refill   albuterol (PROVENTIL) (2.5 MG/3ML) 0.083% nebulizer solution Take 2.5 mg by nebulization every 6 (six) hours as needed for wheezing.      albuterol (VENTOLIN HFA) 108 (90 Base) MCG/ACT inhaler Inhale 2 puffs into the lungs every 6 (six) hours as needed for wheezing or shortness of breath. 8 g 2   cefadroxil (DURICEF) 500 MG capsule Take 1 capsule (500 mg total) by mouth 2 (two) times daily for 21 days. 42 capsule 0   ciprofloxacin (CIPRO) 500 MG tablet Take 1 tablet (500 mg total) by mouth 2 (two) times daily for 21 days. 42 tablet 0   DULoxetine (CYMBALTA) 30 MG capsule Take 1 capsule (30 mg total) by mouth 2 (two) times daily. 180 capsule 3   gabapentin (NEURONTIN) 300 MG capsule Take 1 capsule (300 mg total) by mouth 3 (three) times daily. 270 capsule 3   lamoTRIgine (LAMICTAL) 100 MG tablet Take 100 mg by mouth daily.     loperamide (IMODIUM) 2 MG capsule Take 1 capsule (2 mg total) by mouth as needed for diarrhea or loose stools. 20 capsule 0   metoprolol succinate (TOPROL-XL) 100 MG 24 hr tablet Take 100 mg by mouth daily. Take with or immediately following a meal.     metroNIDAZOLE (FLAGYL) 500 MG tablet Take 1 tablet (500 mg total) by mouth every 12 (twelve) hours for 21 days. 42 tablet 0   Multiple Vitamin (MULTI-VITAMINS) TABS Take 1 tablet by mouth daily.      omeprazole (PRILOSEC) 20 MG capsule Take 40 mg by mouth every morning.     OXYGEN Inhale 2 L into the lungs at bedtime and may repeat dose one time if needed.      QUEtiapine (SEROQUEL) 25 MG tablet Take 25 mg by mouth at bedtime as needed (sleep).     saccharomyces boulardii (FLORASTOR) 250 MG capsule Take 1 capsule (250 mg total) by mouth 2 (two) times daily. 60 capsule 0   SENEXON-S 8.6-50 MG tablet Take 2 tablets by mouth 2 (two) times daily.     TRELEGY ELLIPTA 100-62.5-25 MCG/INH AEPB Inhale 1 puff into the  lungs daily.     No current facility-administered medications for this visit.     Abtx:  Anti-infectives (From admission, onward)    None       REVIEW OF SYSTEMS:  Const: negative fever, negative chills, negative weight loss Eyes: negative diplopia or visual changes, negative eye pain ENT: negative coryza, negative sore throat Resp: + cough, hemoptysis, less dyspnea Cards: negative for chest pain, palpitations, lower extremity edema GU: negative for frequency, dysuria and hematuria GI: Negative for abdominal pain, diarrhea, bleeding, constipation Skin: negative for rash and pruritus Heme:  negative for easy bruising and gum/nose bleeding MS: back pain Neurolo:negative for headaches, dizziness, vertigo, memory problems  Psych:  anxiety, depression  Endocrine: negative for thyroid, diabetes Allergy/Immunology- PCN  Objective:  VITALS:  BP 130/82   Pulse 77   Temp (!) 97.2 F (36.2 C) (Temporal)   Ht '5\' 6"'$  (1.676 m)   Wt 126 lb (57.2 kg)   BMI 20.34 kg/m   PHYSICAL EXAM:  General: Alert, cooperative, emaciated no carotid bruit and no JVD. Lungs: b/l rhonchi Crepts left side Heart: Regular rate and rhythm, no murmur, rub or gallop. Abdomen: Soft, non-tender,not distended. Bowel sounds normal. No masses Extremities: atraumatic, no cyanosis. No edema. No clubbing Skin: No rashes or lesions. Or bruising Lymph: Cervical, supraclavicular normal. Neurologic: Grossly non-focal Pertinent Labs Lab Results CBC 07/22/22 CBC Wbc 10.7  Hb 9.7 PLT 502 Cr 0.9  IMAGING RESULTS: Pt sent for CXR ? Impression/Recommendation Cavitary  pneumonia of the left upper lung and lingula- possible aspiration Also rare pseudomonas in culture Received meropenem while in hospital and discharged on cipro+ cefadroxil+ flagyl ?no side effects from antibiotics Will finish 4 weeks on 07/27/22 As WBC 10.6 may extend antibiotic by a week Will repeat CXR  She needs to follow up with  pulmonologist  COPD/Emphysema   H/o compression fracture of vertebrae s/p kyphoplasty?  H/o ca breats H/o treated HEPC  CXR today'need to follow up with pulmonary _____________________________________.

## 2022-07-25 ENCOUNTER — Telehealth: Payer: Self-pay

## 2022-07-25 NOTE — Telephone Encounter (Signed)
Spoke with Regina Bernard, relayed that chest xray looked much improved and that Dr. Delaine Lame advises she will not need oral antibiotics anymore after 07/27/22. Patient verbalized understanding and has no further questions.   Beryle Flock, RN

## 2022-07-25 NOTE — Telephone Encounter (Signed)
-----  Message from Tsosie Billing, MD sent at 07/24/2022  9:15 PM EST ----- Please let the patient know that CXR is so much imrpved and after 07/27/22 she wont need any more PO antibiotic. thx ----- Message ----- From: Interface, Rad Results In Sent: 07/24/2022   4:05 PM EST To: Tsosie Billing, MD

## 2022-08-14 LAB — ACID FAST CULTURE WITH REFLEXED SENSITIVITIES (MYCOBACTERIA): Acid Fast Culture: NEGATIVE

## 2022-08-15 LAB — ACID FAST CULTURE WITH REFLEXED SENSITIVITIES (MYCOBACTERIA): Acid Fast Culture: NEGATIVE

## 2022-08-16 LAB — ACID FAST CULTURE WITH REFLEXED SENSITIVITIES (MYCOBACTERIA): Acid Fast Culture: NEGATIVE

## 2022-09-16 ENCOUNTER — Other Ambulatory Visit: Payer: Self-pay

## 2022-09-16 DIAGNOSIS — R918 Other nonspecific abnormal finding of lung field: Secondary | ICD-10-CM

## 2022-10-02 ENCOUNTER — Ambulatory Visit
Admission: RE | Admit: 2022-10-02 | Discharge: 2022-10-02 | Disposition: A | Discharge status: Home / Self Care | Payer: Medicare Other | Source: Ambulatory Visit | Attending: Specialist | Admitting: Specialist

## 2022-10-02 DIAGNOSIS — R918 Other nonspecific abnormal finding of lung field: Secondary | ICD-10-CM | POA: Diagnosis present

## 2022-11-13 ENCOUNTER — Ambulatory Visit: Payer: Medicare Other | Admitting: Student in an Organized Health Care Education/Training Program

## 2022-11-21 ENCOUNTER — Ambulatory Visit: Payer: Medicare Other | Admitting: Student in an Organized Health Care Education/Training Program

## 2022-12-24 ENCOUNTER — Encounter: Payer: Self-pay | Admitting: Student in an Organized Health Care Education/Training Program

## 2022-12-24 ENCOUNTER — Ambulatory Visit
Payer: Medicare Other | Attending: Student in an Organized Health Care Education/Training Program | Admitting: Student in an Organized Health Care Education/Training Program

## 2022-12-24 VITALS — BP 128/114 | HR 72 | Temp 97.9°F | Resp 18 | Ht 66.0 in | Wt 130.0 lb

## 2022-12-24 DIAGNOSIS — M48061 Spinal stenosis, lumbar region without neurogenic claudication: Secondary | ICD-10-CM

## 2022-12-24 DIAGNOSIS — G894 Chronic pain syndrome: Secondary | ICD-10-CM | POA: Diagnosis present

## 2022-12-24 DIAGNOSIS — M48062 Spinal stenosis, lumbar region with neurogenic claudication: Secondary | ICD-10-CM | POA: Diagnosis present

## 2022-12-24 DIAGNOSIS — F1910 Other psychoactive substance abuse, uncomplicated: Secondary | ICD-10-CM | POA: Diagnosis present

## 2022-12-24 DIAGNOSIS — Z9889 Other specified postprocedural states: Secondary | ICD-10-CM | POA: Diagnosis present

## 2022-12-24 DIAGNOSIS — S32010A Wedge compression fracture of first lumbar vertebra, initial encounter for closed fracture: Secondary | ICD-10-CM | POA: Insufficient documentation

## 2022-12-24 DIAGNOSIS — S32010S Wedge compression fracture of first lumbar vertebra, sequela: Secondary | ICD-10-CM | POA: Insufficient documentation

## 2022-12-24 HISTORY — DX: Wedge compression fracture of first lumbar vertebra, initial encounter for closed fracture: S32.010A

## 2022-12-24 HISTORY — DX: Spinal stenosis, lumbar region without neurogenic claudication: M48.061

## 2022-12-24 HISTORY — DX: Chronic pain syndrome: G89.4

## 2022-12-24 NOTE — Progress Notes (Signed)
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 of lumbar region   3. Compression fracture of L1 vertebra, sequela   4. History of kyphoplasty   5. Polysubstance abuse (HCC) (hx of)   6. Chronic pain syndrome    Plan of Care (Initial workup plan)  I reviewed the patient's lumbar MRI with her in detail.  She does have significant multilevel spinal canal stenosis as well as neuroforaminal stenosis that is contributing to her symptoms.  We discussed spinal cord stimulation and what that entails.  Unfortunately, given her kyphoplasty at L1, she would not be a candidate for percutaneous spinal cord stim trial.  I informed her that the cement material in her vertebral body does not allow proper visualization of the percutaneous leads on AP  and lateral imaging.  She is inquiring about additional injections and I encouraged her to follow back up with Dr. Mariah Milling for  that she since she is already had spinal injections with her.  Future considerations would also include referral to neurosurgery for potential surgical decompression.  I did discuss TENS unit with her that she can get over-the-counter.  Nurse was present in the room for the duration of the encounter. Provider-requested follow-up: Return for patient will call to schedule F2F appt prn.  No future appointments.  Duration of encounter: .  Total time on encounter, as per AMA guidelines included both the face-to-face and non-face-to-face time personally spent by the physician and/or other qualified health care professional(s) on the day of the encounter (includes time in activities that require the physician or other qualified health care professional and does not include time in activities normally performed by clinical staff). Physician's time may include the following activities when performed: Preparing to see the patient (e.g., pre-charting review of records, searching for previously ordered imaging, lab work, and nerve conduction tests) Review of prior analgesic pharmacotherapies. Reviewing PMP Interpreting ordered tests (e.g., lab work, imaging, nerve conduction tests) Performing post-procedure evaluations, including interpretation of diagnostic procedures Obtaining and/or reviewing separately obtained history Performing a medically appropriate examination and/or evaluation Counseling and educating the patient/family/caregiver Ordering medications, tests, or procedures Referring and communicating with other health care professionals (when not separately reported) Documenting clinical information in the electronic or other health record Independently interpreting results (not separately reported) and communicating results to the patient/ family/caregiver Care coordination (not separately reported)  Note by: Edward Jolly, MD (TTS technology used. I apologize for any typographical errors  that were not detected and corrected.) Date: 12/24/2022; Time: 2:51 PM

## 2022-12-24 NOTE — Progress Notes (Signed)
Safety precautions to be maintained throughout the outpatient stay will include: orient to surroundings, keep bed in low position, maintain call bell within reach at all times, provide assistance with transfer out of bed and ambulation.  

## 2022-12-24 NOTE — Patient Instructions (Signed)
Thanks for seeing Regina Bernard today. We reviewed your lumbar MRI from August 2023. Based upon your kyphoplasty and associated cement at L1 and L2, you would not be a candidate for a percutaneous spinal cord stimulator trial.  Please follow back up with Dr Mariah Milling to consider repeat injections. If those are not helpful, please seek a neurosurgery referral.

## 2023-02-06 ENCOUNTER — Telehealth: Payer: Self-pay | Admitting: Gastroenterology

## 2023-02-06 NOTE — Telephone Encounter (Signed)
Pt left message to schedule appointment for blackstools I informed her that she would need to get a referral from her PCP to schedule an appointment because she had never been seen in our office

## 2023-04-25 ENCOUNTER — Inpatient Hospital Stay: Payer: Medicare Other

## 2023-04-25 ENCOUNTER — Other Ambulatory Visit: Payer: Self-pay

## 2023-04-25 ENCOUNTER — Emergency Department: Payer: Medicare Other

## 2023-04-25 ENCOUNTER — Inpatient Hospital Stay
Admission: EM | Admit: 2023-04-25 | Discharge: 2023-05-02 | DRG: 481 | Disposition: A | Discharge status: Skilled Nursing Facility | Payer: Medicare Other | Attending: Internal Medicine | Admitting: Internal Medicine

## 2023-04-25 DIAGNOSIS — Z853 Personal history of malignant neoplasm of breast: Secondary | ICD-10-CM

## 2023-04-25 DIAGNOSIS — W010XXA Fall on same level from slipping, tripping and stumbling without subsequent striking against object, initial encounter: Secondary | ICD-10-CM | POA: Diagnosis present

## 2023-04-25 DIAGNOSIS — D62 Acute posthemorrhagic anemia: Secondary | ICD-10-CM | POA: Diagnosis not present

## 2023-04-25 DIAGNOSIS — S72142A Displaced intertrochanteric fracture of left femur, initial encounter for closed fracture: Principal | ICD-10-CM | POA: Diagnosis present

## 2023-04-25 DIAGNOSIS — J441 Chronic obstructive pulmonary disease with (acute) exacerbation: Secondary | ICD-10-CM | POA: Diagnosis present

## 2023-04-25 DIAGNOSIS — F17210 Nicotine dependence, cigarettes, uncomplicated: Secondary | ICD-10-CM | POA: Diagnosis present

## 2023-04-25 DIAGNOSIS — Z8249 Family history of ischemic heart disease and other diseases of the circulatory system: Secondary | ICD-10-CM

## 2023-04-25 DIAGNOSIS — E872 Acidosis, unspecified: Secondary | ICD-10-CM | POA: Diagnosis present

## 2023-04-25 DIAGNOSIS — Z880 Allergy status to penicillin: Secondary | ICD-10-CM

## 2023-04-25 DIAGNOSIS — Z923 Personal history of irradiation: Secondary | ICD-10-CM | POA: Diagnosis not present

## 2023-04-25 DIAGNOSIS — Y92009 Unspecified place in unspecified non-institutional (private) residence as the place of occurrence of the external cause: Secondary | ICD-10-CM | POA: Diagnosis not present

## 2023-04-25 DIAGNOSIS — Z23 Encounter for immunization: Secondary | ICD-10-CM

## 2023-04-25 DIAGNOSIS — I10 Essential (primary) hypertension: Secondary | ICD-10-CM | POA: Diagnosis present

## 2023-04-25 DIAGNOSIS — R079 Chest pain, unspecified: Secondary | ICD-10-CM | POA: Diagnosis present

## 2023-04-25 DIAGNOSIS — K760 Fatty (change of) liver, not elsewhere classified: Secondary | ICD-10-CM | POA: Diagnosis present

## 2023-04-25 DIAGNOSIS — S72002A Fracture of unspecified part of neck of left femur, initial encounter for closed fracture: Secondary | ICD-10-CM | POA: Diagnosis present

## 2023-04-25 DIAGNOSIS — M62838 Other muscle spasm: Secondary | ICD-10-CM | POA: Diagnosis present

## 2023-04-25 DIAGNOSIS — Z9221 Personal history of antineoplastic chemotherapy: Secondary | ICD-10-CM

## 2023-04-25 DIAGNOSIS — F17290 Nicotine dependence, other tobacco product, uncomplicated: Secondary | ICD-10-CM | POA: Diagnosis present

## 2023-04-25 DIAGNOSIS — Z90710 Acquired absence of both cervix and uterus: Secondary | ICD-10-CM

## 2023-04-25 DIAGNOSIS — J9611 Chronic respiratory failure with hypoxia: Secondary | ICD-10-CM | POA: Diagnosis present

## 2023-04-25 DIAGNOSIS — Z2239 Carrier of other specified bacterial diseases: Secondary | ICD-10-CM

## 2023-04-25 DIAGNOSIS — K7469 Other cirrhosis of liver: Secondary | ICD-10-CM | POA: Diagnosis not present

## 2023-04-25 DIAGNOSIS — W19XXXA Unspecified fall, initial encounter: Secondary | ICD-10-CM | POA: Diagnosis not present

## 2023-04-25 DIAGNOSIS — F53 Postpartum depression: Secondary | ICD-10-CM | POA: Insufficient documentation

## 2023-04-25 DIAGNOSIS — F419 Anxiety disorder, unspecified: Secondary | ICD-10-CM | POA: Diagnosis present

## 2023-04-25 DIAGNOSIS — Z9981 Dependence on supplemental oxygen: Secondary | ICD-10-CM

## 2023-04-25 DIAGNOSIS — F32A Depression, unspecified: Secondary | ICD-10-CM | POA: Diagnosis present

## 2023-04-25 DIAGNOSIS — K279 Peptic ulcer, site unspecified, unspecified as acute or chronic, without hemorrhage or perforation: Secondary | ICD-10-CM | POA: Diagnosis present

## 2023-04-25 DIAGNOSIS — F418 Other specified anxiety disorders: Secondary | ICD-10-CM | POA: Diagnosis not present

## 2023-04-25 DIAGNOSIS — N179 Acute kidney failure, unspecified: Secondary | ICD-10-CM | POA: Diagnosis present

## 2023-04-25 DIAGNOSIS — S72002S Fracture of unspecified part of neck of left femur, sequela: Secondary | ICD-10-CM | POA: Diagnosis not present

## 2023-04-25 DIAGNOSIS — E875 Hyperkalemia: Secondary | ICD-10-CM | POA: Diagnosis present

## 2023-04-25 DIAGNOSIS — E440 Moderate protein-calorie malnutrition: Secondary | ICD-10-CM | POA: Diagnosis present

## 2023-04-25 DIAGNOSIS — E871 Hypo-osmolality and hyponatremia: Secondary | ICD-10-CM | POA: Diagnosis present

## 2023-04-25 DIAGNOSIS — K7460 Unspecified cirrhosis of liver: Secondary | ICD-10-CM | POA: Diagnosis present

## 2023-04-25 DIAGNOSIS — Z6820 Body mass index (BMI) 20.0-20.9, adult: Secondary | ICD-10-CM

## 2023-04-25 DIAGNOSIS — Z1152 Encounter for screening for COVID-19: Secondary | ICD-10-CM

## 2023-04-25 DIAGNOSIS — Z9851 Tubal ligation status: Secondary | ICD-10-CM

## 2023-04-25 DIAGNOSIS — C50811 Malignant neoplasm of overlapping sites of right female breast: Secondary | ICD-10-CM

## 2023-04-25 DIAGNOSIS — Z79899 Other long term (current) drug therapy: Secondary | ICD-10-CM

## 2023-04-25 HISTORY — DX: Fracture of unspecified part of neck of left femur, initial encounter for closed fracture: S72.002A

## 2023-04-25 LAB — BASIC METABOLIC PANEL
Anion gap: 10 (ref 5–15)
BUN: 28 mg/dL — ABNORMAL HIGH (ref 8–23)
CO2: 28 mmol/L (ref 22–32)
Calcium: 9.4 mg/dL (ref 8.9–10.3)
Chloride: 94 mmol/L — ABNORMAL LOW (ref 98–111)
Creatinine, Ser: 1.41 mg/dL — ABNORMAL HIGH (ref 0.44–1.00)
GFR, Estimated: 41 mL/min — ABNORMAL LOW (ref >60)
Glucose, Bld: 102 mg/dL — ABNORMAL HIGH (ref 70–99)
Potassium: 5.5 mmol/L — ABNORMAL HIGH (ref 3.5–5.1)
Sodium: 132 mmol/L — ABNORMAL LOW (ref 135–145)

## 2023-04-25 LAB — TROPONIN I (HIGH SENSITIVITY)
Troponin I (High Sensitivity): 6 ng/L (ref <18)
Troponin I (High Sensitivity): 6 ng/L (ref <18)

## 2023-04-25 LAB — CBC
HCT: 34.2 % — ABNORMAL LOW (ref 36.0–46.0)
Hemoglobin: 11.1 g/dL — ABNORMAL LOW (ref 12.0–15.0)
MCH: 33.3 pg (ref 26.0–34.0)
MCHC: 32.5 g/dL (ref 30.0–36.0)
MCV: 102.7 fL — ABNORMAL HIGH (ref 80.0–100.0)
Platelets: 213 10*3/uL (ref 150–400)
RBC: 3.33 MIL/uL — ABNORMAL LOW (ref 3.87–5.11)
RDW: 13.3 % (ref 11.5–15.5)
WBC: 9.4 10*3/uL (ref 4.0–10.5)
nRBC: 0 % (ref 0.0–0.2)

## 2023-04-25 LAB — TYPE AND SCREEN
ABO/RH(D): A POS
Antibody Screen: NEGATIVE

## 2023-04-25 LAB — APTT: aPTT: 33 s (ref 24–36)

## 2023-04-25 LAB — PROTIME-INR
INR: 1 (ref 0.8–1.2)
Prothrombin Time: 13.7 s (ref 11.4–15.2)

## 2023-04-25 LAB — CBG MONITORING, ED: Glucose-Capillary: 179 mg/dL — ABNORMAL HIGH (ref 70–99)

## 2023-04-25 MED ORDER — ONDANSETRON HCL 4 MG/2ML IJ SOLN: 4.0000 mg | Freq: Three times a day (TID) | INTRAMUSCULAR | Status: DC | PRN

## 2023-04-25 MED ORDER — ACETAMINOPHEN 325 MG PO TABS: 650.0000 mg | ORAL_TABLET | Freq: Four times a day (QID) | ORAL | Status: DC | PRN

## 2023-04-25 MED ORDER — SENNOSIDES-DOCUSATE SODIUM 8.6-50 MG PO TABS: 1.0000 | ORAL_TABLET | Freq: Every evening | ORAL | Status: DC | PRN

## 2023-04-25 MED ORDER — NICOTINE 21 MG/24HR TD PT24
21.0000 mg | MEDICATED_PATCH | Freq: Every day | TRANSDERMAL | Status: DC
  Administered 2023-04-25 – 2023-05-02 (×8): 21 mg via TRANSDERMAL
  Filled 2023-04-25 (×8): qty 1

## 2023-04-25 MED ORDER — HYDROMORPHONE HCL 1 MG/ML IJ SOLN: 1.0000 mg | INTRAMUSCULAR | Status: DC

## 2023-04-25 MED ORDER — ADULT MULTIVITAMIN W/MINERALS CH
1.0000 | ORAL_TABLET | Freq: Every day | ORAL | Status: DC
  Administered 2023-04-26 – 2023-05-02 (×7): 1 via ORAL
  Filled 2023-04-25 (×7): qty 1

## 2023-04-25 MED ORDER — AZITHROMYCIN 500 MG PO TABS
500.0000 mg | ORAL_TABLET | Freq: Every day | ORAL | Status: AC
  Administered 2023-04-25: 500 mg via ORAL
  Filled 2023-04-25: qty 1

## 2023-04-25 MED ORDER — SODIUM ZIRCONIUM CYCLOSILICATE 10 G PO PACK
10.0000 g | PACK | Freq: Once | ORAL | Status: AC
  Administered 2023-04-25: 10 g via ORAL
  Filled 2023-04-25: qty 1

## 2023-04-25 MED ORDER — METHOCARBAMOL 500 MG PO TABS: 500.0000 mg | ORAL_TABLET | Freq: Three times a day (TID) | ORAL | Status: DC | PRN

## 2023-04-25 MED ORDER — METHYLPREDNISOLONE SODIUM SUCC 125 MG IJ SOLR
125.0000 mg | INTRAMUSCULAR | Status: AC
  Administered 2023-04-25: 125 mg via INTRAVENOUS
  Filled 2023-04-25: qty 2

## 2023-04-25 MED ORDER — DIAZEPAM 5 MG PO TABS
5.0000 mg | ORAL_TABLET | Freq: Once | ORAL | Status: AC
  Administered 2023-04-25: 5 mg via ORAL
  Filled 2023-04-25: qty 1

## 2023-04-25 MED ORDER — IOHEXOL 350 MG/ML SOLN
75.0000 mL | Freq: Once | INTRAVENOUS | Status: AC | PRN
  Administered 2023-04-25: 75 mL via INTRAVENOUS

## 2023-04-25 MED ORDER — LIDOCAINE 5 % EX PTCH
1.0000 | MEDICATED_PATCH | CUTANEOUS | Status: DC
  Administered 2023-04-25 – 2023-05-01 (×7): 1 via TRANSDERMAL
  Filled 2023-04-25 (×7): qty 1

## 2023-04-25 MED ORDER — NITROGLYCERIN 0.4 MG SL SUBL: 0.4000 mg | SUBLINGUAL_TABLET | SUBLINGUAL | Status: DC | PRN

## 2023-04-25 MED ORDER — OXYCODONE-ACETAMINOPHEN 5-325 MG PO TABS: 1.0000 | ORAL_TABLET | ORAL | Status: DC | PRN

## 2023-04-25 MED ORDER — ALBUTEROL SULFATE (2.5 MG/3ML) 0.083% IN NEBU
5.0000 mg | INHALATION_SOLUTION | Freq: Once | RESPIRATORY_TRACT | Status: AC
  Administered 2023-04-25: 5 mg via RESPIRATORY_TRACT
  Filled 2023-04-25: qty 6

## 2023-04-25 MED ORDER — TRANEXAMIC ACID-NACL 1000-0.7 MG/100ML-% IV SOLN
1000.0000 mg | INTRAVENOUS | Status: AC
  Administered 2023-04-26: 1000 mg via INTRAVENOUS
  Filled 2023-04-25: qty 100

## 2023-04-25 MED ORDER — QUETIAPINE FUMARATE 25 MG PO TABS: 25.0000 mg | ORAL_TABLET | Freq: Every evening | ORAL | Status: DC | PRN

## 2023-04-25 MED ORDER — METOPROLOL SUCCINATE ER 50 MG PO TB24
100.0000 mg | ORAL_TABLET | Freq: Every day | ORAL | Status: DC
  Administered 2023-04-27 – 2023-05-02 (×6): 100 mg via ORAL
  Filled 2023-04-25 (×7): qty 2

## 2023-04-25 MED ORDER — HYDRALAZINE HCL 20 MG/ML IJ SOLN
5.0000 mg | INTRAMUSCULAR | Status: DC | PRN
  Administered 2023-04-30: 5 mg via INTRAVENOUS
  Filled 2023-04-25: qty 1

## 2023-04-25 MED ORDER — DEXTROSE 50 % IV SOLN
50.0000 mL | Freq: Once | INTRAVENOUS | Status: AC
  Administered 2023-04-25: 50 mL via INTRAVENOUS
  Filled 2023-04-25: qty 50

## 2023-04-25 MED ORDER — METHYLPREDNISOLONE SODIUM SUCC 40 MG IJ SOLR
40.0000 mg | Freq: Two times a day (BID) | INTRAMUSCULAR | Status: DC
  Administered 2023-04-26 – 2023-04-27 (×3): 40 mg via INTRAVENOUS
  Filled 2023-04-25 (×3): qty 1

## 2023-04-25 MED ORDER — LOPERAMIDE HCL 2 MG PO CAPS: 2.0000 mg | ORAL_CAPSULE | ORAL | Status: DC | PRN

## 2023-04-25 MED ORDER — GABAPENTIN 300 MG PO CAPS
300.0000 mg | ORAL_CAPSULE | Freq: Three times a day (TID) | ORAL | Status: DC
  Administered 2023-04-25 – 2023-05-02 (×20): 300 mg via ORAL
  Filled 2023-04-25 (×21): qty 1

## 2023-04-25 MED ORDER — IPRATROPIUM-ALBUTEROL 0.5-2.5 (3) MG/3ML IN SOLN
3.0000 mL | Freq: Once | RESPIRATORY_TRACT | Status: AC
  Administered 2023-04-25: 3 mL via RESPIRATORY_TRACT
  Filled 2023-04-25: qty 3

## 2023-04-25 MED ORDER — LAMOTRIGINE 25 MG PO TABS
100.0000 mg | ORAL_TABLET | Freq: Every day | ORAL | Status: DC
  Administered 2023-04-26 – 2023-05-02 (×7): 100 mg via ORAL
  Filled 2023-04-25 (×7): qty 4

## 2023-04-25 MED ORDER — CHLORHEXIDINE GLUCONATE 4 % EX SOLN: Freq: Once | CUTANEOUS | Status: DC

## 2023-04-25 MED ORDER — AZITHROMYCIN 250 MG PO TABS
250.0000 mg | ORAL_TABLET | Freq: Every day | ORAL | Status: AC
  Administered 2023-04-27 – 2023-04-29 (×3): 250 mg via ORAL
  Filled 2023-04-25 (×3): qty 1

## 2023-04-25 MED ORDER — DULOXETINE HCL 30 MG PO CPEP
30.0000 mg | ORAL_CAPSULE | Freq: Two times a day (BID) | ORAL | Status: DC
  Administered 2023-04-25 – 2023-05-02 (×14): 30 mg via ORAL
  Filled 2023-04-25 (×14): qty 1

## 2023-04-25 MED ORDER — GABAPENTIN 300 MG PO CAPS
400.0000 mg | ORAL_CAPSULE | Freq: Two times a day (BID) | ORAL | Status: DC
Start: 2023-04-25 — End: 2023-04-25

## 2023-04-25 MED ORDER — MORPHINE SULFATE (PF) 4 MG/ML IV SOLN
4.0000 mg | Freq: Once | INTRAVENOUS | Status: AC
  Administered 2023-04-25: 4 mg via INTRAVENOUS
  Filled 2023-04-25: qty 1

## 2023-04-25 MED ORDER — ALBUTEROL SULFATE (2.5 MG/3ML) 0.083% IN NEBU
2.5000 mg | INHALATION_SOLUTION | RESPIRATORY_TRACT | Status: DC | PRN
  Administered 2023-04-26: 2.5 mg via RESPIRATORY_TRACT
  Filled 2023-04-25: qty 3

## 2023-04-25 MED ORDER — DM-GUAIFENESIN ER 30-600 MG PO TB12: 1.0000 | ORAL_TABLET | Freq: Two times a day (BID) | ORAL | Status: DC | PRN

## 2023-04-25 MED ORDER — HYDROCODONE-ACETAMINOPHEN 5-325 MG PO TABS
1.0000 | ORAL_TABLET | ORAL | Status: DC | PRN
  Administered 2023-04-25: 1 via ORAL
  Filled 2023-04-25: qty 1

## 2023-04-25 MED ORDER — MORPHINE SULFATE (PF) 2 MG/ML IV SOLN
2.0000 mg | INTRAVENOUS | Status: DC | PRN
  Administered 2023-04-26 – 2023-04-29 (×3): 2 mg via INTRAVENOUS
  Filled 2023-04-25 (×3): qty 1

## 2023-04-25 MED ORDER — SODIUM CHLORIDE 0.9 % IV SOLN: INTRAVENOUS | Status: DC

## 2023-04-25 MED ORDER — INSULIN ASPART 100 UNIT/ML IV SOLN
8.0000 [IU] | Freq: Once | INTRAVENOUS | Status: AC
  Administered 2023-04-25: 8 [IU] via INTRAVENOUS
  Filled 2023-04-25: qty 0.08

## 2023-04-25 MED ORDER — CEFAZOLIN SODIUM-DEXTROSE 2-4 GM/100ML-% IV SOLN
2.0000 g | INTRAVENOUS | Status: DC
  Filled 2023-04-25: qty 100

## 2023-04-25 MED ORDER — IPRATROPIUM-ALBUTEROL 0.5-2.5 (3) MG/3ML IN SOLN
3.0000 mL | RESPIRATORY_TRACT | Status: DC
  Administered 2023-04-25 – 2023-04-27 (×8): 3 mL via RESPIRATORY_TRACT
  Filled 2023-04-25 (×7): qty 3

## 2023-04-25 MED ORDER — PANTOPRAZOLE SODIUM 40 MG PO TBEC
40.0000 mg | DELAYED_RELEASE_TABLET | Freq: Every day | ORAL | Status: DC
  Administered 2023-04-27 – 2023-05-02 (×6): 40 mg via ORAL
  Filled 2023-04-25 (×7): qty 1

## 2023-04-25 NOTE — ED Provider Notes (Signed)
[facility] Provider Note    Event Date/Time   First MD Initiated Contact with Patient 04/25/23 1706     (approximate)   History   Chief Complaint: Fall and Chest Pain   HPI  Regina Bernard is a 67 y.o. female with a history of COPD on 3 L nasal cannula at home, hypertension, liver cirrhosis, breast cancer, polysubstance abuse who comes to the ED complaining of left hip pain and inability to bear weight after a fall at home yesterday.  She was in her usual state of health, went to get up out of her recliner, and did not see that her dog was laying on her oxygen tubing.  This caused her to get tripped and fall over.  She had immediate pain, and was helped back into the recliner, unable to get up since yesterday.  No chest pain shortness of breath or head injury.     Physical Exam   Triage Vital Signs: ED Triage Vitals [04/25/23 1726]  Encounter Vitals Group     BP      Systolic BP Percentile      Diastolic BP Percentile      Pulse      Resp      Temp 97.9 F (36.6 C)     Temp Source Axillary     SpO2      Weight 130 lb 1.1 oz (59 kg)     Height      Head Circumference      Peak Flow      Pain Score 5     Pain Loc      Pain Education      Exclude from Growth Chart     Most recent vital signs: Vitals:   04/25/23 1726 04/25/23 1727  BP:  110/68  Pulse:  66  Resp:  16  Temp: 97.9 F (36.6 C)   SpO2:  95%    General: Awake, no distress.  CV:  Good peripheral perfusion.  Regular rate and rhythm Resp:  Normal effort.  Diffuse expiratory wheezing, prolonged expiratory phase Abd:  No distention.  Soft nontender Other:  Exquisite tenderness at the left hip.  There is shortening and external rotation of the left leg.  Neurovascular intact.  No open wounds. Tenderness at the left knee, distal femur component.  No effusion.  Dry oral mucosa   ED Results / Procedures / Treatments   Labs (all labs ordered are listed, but only abnormal  results are displayed) Labs Reviewed  CBC - Abnormal; Notable for the following components:      Result Value   RBC 3.33 (*)    Hemoglobin 11.1 (*)    HCT 34.2 (*)    MCV 102.7 (*)    All other components within normal limits  BASIC METABOLIC PANEL - Abnormal; Notable for the following components:   Sodium 132 (*)    Potassium 5.5 (*)    Chloride 94 (*)    Glucose, Bld 102 (*)    BUN 28 (*)    Creatinine, Ser 1.41 (*)    GFR, Estimated 41 (*)    All other components within normal limits  TROPONIN I (HIGH SENSITIVITY)  TROPONIN I (HIGH SENSITIVITY)     EKG Interpreted by me Sinus rhythm, rate of 60.  Normal axis and intervals.  Normal QRS ST segments and T waves.  Slight, less than 1 mm ST elevation diffusely, likely due to LVH.  Not significantly changed compared  to prior EKG on 06/28/2022.  Repeat EKG shows sinus rhythm rate of 62, no evolving ischemic changes   RADIOLOGY X-ray left hip interpreted by me, shows displaced intertrochanteric fracture of the left femur.  Radiology report reviewed Chest x-ray and knee x-ray unremarkable   PROCEDURES:  Procedures   MEDICATIONS ORDERED IN ED: Medications  diazepam (VALIUM) tablet 5 mg (has no administration in time range)  methylPREDNISolone sodium succinate (SOLU-MEDROL) 125 mg/2 mL injection 125 mg (125 mg Intravenous Given 04/25/23 1730)  ipratropium-albuterol (DUONEB) 0.5-2.5 (3) MG/3ML nebulizer solution 3 mL (3 mLs Nebulization Given 04/25/23 1826)  albuterol (PROVENTIL) (2.5 MG/3ML) 0.083% nebulizer solution 5 mg (5 mg Nebulization Given 04/25/23 1825)  morphine (PF) 4 MG/ML injection 4 mg (4 mg Intravenous Given 04/25/23 1731)     IMPRESSION / MDM / ASSESSMENT AND PLAN / ED COURSE  I reviewed the triage vital signs and the nursing notes.  DDx: Hip fracture, knee fracture, COPD exacerbation, pneumonia, dehydration, AKI  Patient's presentation is most consistent with acute presentation with potential threat to  life or bodily function.  Patient presents with left hip pain after a mechanical fall at home yesterday.  Clinically apparent hip fracture.  X-ray confirms this, displaced intertrochanteric fracture.  No other apparent injuries.  Labs show mild AKI, and she appears dehydrated from minimal oral intake over the last 24 hours.  Also appears to be having COPD exacerbation, possibly from breathing close to the floor and being exposed to large amount of dust and smoke residue.  Will give Solu-Medrol, bronchodilators.  Case discussed with orthopedics, Dr. Hyacinth Meeker will undertake surgical repair tomorrow.  Will contact hospitalist for further management.       FINAL CLINICAL IMPRESSION(S) / ED DIAGNOSES   Final diagnoses:  Closed intertrochanteric fracture of left femur, initial encounter (HCC)  Chronic respiratory failure with hypoxia (HCC)  AKI (acute kidney injury) (HCC)     Rx / DC Orders   ED Discharge Orders     None        Note:  This document was prepared using Dragon voice recognition software and may include unintentional dictation errors.   Sharman Cheek, MD 04/25/23 [phone]

## 2023-04-25 NOTE — ED Triage Notes (Signed)
Pt to ED via [facility] from home. Pt fell last pm on left side. Pt reports she tripped over her oxygen cord. Pt denies LOC or head trauma. Pt has been unable to bear weight since fall. Husband placed pt in bed and pain was unbearable today and called EMS. Pt reports left hip pain, left knee pain and centralized CP. Pt with audible wheezes on arrival. Pt wears 3L [place] chronically for COPD.   EMS gave of fentanyl initial and another  PTA. 20g LAC.

## 2023-04-25 NOTE — H&P (Signed)
History and Physical    Regina Bernard HYQ:[id number] DOB: 1955/08/31 DOA: 04/25/2023  Referring MD/NP/PA:   PCP: Dorothey Baseman, MD   Patient coming from:  The patient is coming from home.     Chief Complaint: SOB, chest pain, fall and left hip pain  HPI: Regina Bernard is a 67 y.o. female with medical history significant of COPD on 2L O2, HTN, GI bleeding, PUD, depression with anxiety, polysubstance abuse, chronic pain, liver cirrhosis, breast cancer, who presents with shortness of breath, chest pain, fall and left hip pain.  Patient states that she has shortness of breath in the past several days, which has been progressively worsening.  Patient has wheezing and cough with little mucus production.  She states that she does not have chest pain at rest, but coughing and deep breath induce pleuritic chest pain, which is moderate, sharp, nonradiating.  No fever or chills.  Patient does not have nausea, vomiting, diarrhea or abdominal pain.  No symptoms of UTI.  Pt states that she fell 2 days ago. She reports she tripped over her oxygen cord and fell. She injured her left hip and left knee, causing pain in left hip and knee.  The left hip pain is worse, which is constant, sharp, severe, nonradiating, aggravated by movement.  Denies loss of consciousness.  Strongly denies any head or neck injury.  She refused CT scan of head and neck.  Data reviewed independently and ED Course: pt was found to have WBC 9.4, potassium 5.5, AKI with creatinine of 1.41, BUN 28, GFR 41.  Temperature normal, blood pressure 110/68, heart rate 66, RR 16, oxygen saturation 95% on 3 L oxygen (patient is using 2 L oxygen normally).  Chest x-ray negative.  CTA negative for PE.  X-ray of left knee is negative.  X-ray of left hip/pelvis that showed displaced intertrochanteric left proximal femur fracture. Patient is admitted to telemetry bed as inpatient.  Dr. Hyacinth Meeker of Ortho is consulted.   EKG: I have personally  reviewed.  Sinus rhythm, QTc 437, LVH, nonspecific T wave change.  Review of Systems:   General: no fevers, chills, no body weight gain, has fatigue HEENT: no blurry vision, hearing changes or sore throat Respiratory: has dyspnea, coughing, wheezing CV: has chest pain, no palpitations GI: no nausea, vomiting, abdominal pain, diarrhea, constipation GU: no dysuria, burning on urination, increased urinary frequency, hematuria  Ext: no leg edema Neuro: no unilateral weakness, numbness, or tingling, no vision change or hearing loss. Has fall Skin: no rash, no skin tear. MSK: has pain in left hip and knee Heme: No easy bruising.  Travel history: No recent long distant travel.   Allergy:  Allergies  Allergen Reactions   Penicillins Swelling and Rash    Has patient had a PCN reaction causing immediate rash, facial/tongue/throat swelling, SOB or lightheadedness with hypotension: Yes Has patient had a PCN reaction causing severe rash involving mucus membranes or skin necrosis: No Has patient had a PCN reaction that required hospitalization: No Has patient had a PCN reaction occurring within the last 10 years: No If all of the above answers are "NO", then may proceed with Cephalosporin use.     Past Medical History:  Diagnosis Date   Abnormal CT scan, chest    Anxiety    Asthma    Breast cancer (HCC)    Breast CA- Right    Breast cancer (HCC) 06/18/2011   right breast cancer   COPD (chronic obstructive pulmonary disease) (HCC)  Endometriosis    tx with vaginal hysterectomy   Fatty liver    Hepatitis C 1998   [facility] trial treatment in-patient study 2005. HCV 740, 05/20/11.   History of substance abuse (HCC)    Heroine, cocaine, marijuana. States all of them. Heavy use 1995 to 2000. Occasional use before and after. None since 2006.    Hot flashes related to aromatase inhibitor therapy    Hypertension    Lung mass    Peripheral neuropathy    in hands and feet    Past Surgical  History:  Procedure Laterality Date   BREAST LUMPECTOMY Right 2013   COLONOSCOPY WITH PROPOFOL N/A 03/20/2022   Procedure: COLONOSCOPY WITH PROPOFOL;  Surgeon: Toney Reil, MD;  Location: [facility] ENDOSCOPY;  Service: Gastroenterology;  Laterality: N/A;   ESOPHAGOGASTRODUODENOSCOPY (EGD) WITH PROPOFOL N/A 10/29/2021   Procedure: ESOPHAGOGASTRODUODENOSCOPY (EGD) WITH PROPOFOL;  Surgeon: Toney Reil, MD;  Location: [facility] ENDOSCOPY;  Service: Gastroenterology;  Laterality: N/A;   ESOPHAGOGASTRODUODENOSCOPY (EGD) WITH PROPOFOL N/A 02/14/2022   Procedure: ESOPHAGOGASTRODUODENOSCOPY (EGD) WITH PROPOFOL;  Surgeon: Toney Reil, MD;  Location: [facility] ENDOSCOPY;  Service: Gastroenterology;  Laterality: N/A;  DRIVER 5 - 10 MINUTES AWAY   TUBAL LIGATION  1996   Upper right leg benign tumor removed     VAGINAL HYSTERECTOMY  2001    Social History:  reports that she has been smoking e-cigarettes. She has never used smokeless tobacco. She reports current alcohol use. She reports that she does not use drugs.  Family History:  Family History  Problem Relation Age of Onset   Diabetes Maternal Grandmother    Lung cancer Father    Lung cancer Mother    Hypertension Mother    Heart disease Mother    Skin cancer Mother    Breast cancer Neg Hx      Prior to Admission medications   Medication Sig Start Date End Date Taking? Authorizing Provider  albuterol (PROVENTIL) (2.5 MG/3ML) 0.083% nebulizer solution Take 2.5 mg by nebulization every 6 (six) hours as needed for wheezing.  09/16/14   [provider]  albuterol (VENTOLIN HFA) 108 (90 Base) MCG/ACT inhaler Inhale 2 puffs into the lungs every 6 (six) hours as needed for wheezing or shortness of breath. 07/06/22   Leatha Gilding, MD  DULoxetine (CYMBALTA) 30 MG capsule Take 1 capsule (30 mg total) by mouth 2 (two) times daily. 09/19/17   Earna Coder, MD  gabapentin (NEURONTIN) 300 MG capsule Take 1 capsule (300 mg total) by  mouth 3 (three) times daily. 10/25/20   Earna Coder, MD  lamoTRIgine (LAMICTAL) 100 MG tablet Take 100 mg by mouth daily. 11/07/21   [provider]  loperamide (IMODIUM) 2 MG capsule Take 1 capsule (2 mg total) by mouth as needed for diarrhea or loose stools. 07/06/22   Leatha Gilding, MD  metoprolol succinate (TOPROL-XL) 100 MG 24 hr tablet Take 100 mg by mouth daily. Take with or immediately following a meal.    [provider]  Multiple Vitamin (MULTI-VITAMINS) TABS Take 1 tablet by mouth daily.     [provider]  nystatin (MYCOSTATIN) 100000 UNIT/ML suspension Take 5 mLs (500,000 Units total) by mouth 4 (four) times daily. Patient not taking: Reported on 12/24/2022 07/23/22   Lynn Ito, MD  omeprazole (PRILOSEC) 20 MG capsule Take 40 mg by mouth every morning. 02/14/22   [provider]  OXYGEN Inhale 2 L into the lungs at bedtime and may repeat dose  one time if needed.     [provider]  QUEtiapine (SEROQUEL) 25 MG tablet Take 25 mg by mouth at bedtime as needed (sleep). 08/24/21   [provider]  SENEXON-S 8.6-50 MG tablet Take 2 tablets by mouth 2 (two) times daily. Patient not taking: Reported on 12/24/2022 12/18/21   [provider]  TRELEGY ELLIPTA 100-62.5-25 MCG/INH AEPB Inhale 1 puff into the lungs daily. 11/06/18   [provider]    Physical Exam: Vitals:   04/25/23 2010 04/25/23 2200 04/25/23 2232 04/25/23 2233  BP: 99/73 113/78 103/66   Pulse: 70 61  63  Resp: 20 13 16    Temp:   98.2 F (36.8 C)   TempSrc:      SpO2: 94% 96% 95% 95%  Weight:       General: Not in acute distress HEENT:       Eyes: PERRL, EOMI, no jaundice       ENT: No discharge from the ears and nose, no pharynx injection, no tonsillar enlargement.        Neck: No JVD, no bruit, no mass felt. Heme: No neck lymph node enlargement. Cardiac: S1/S2, RRR, No murmurs, No gallops or rubs. Respiratory: has wheezing  bilaterally GI: Soft, nondistended, nontender, no rebound pain, no organomegaly, BS present. GU: No hematuria Ext: No pitting leg edema bilaterally. 1+DP/PT pulse bilaterally. Musculoskeletal: Has tenderness in the left hip and left knee, left leg is externally rotated Skin: No rashes.  Neuro: Alert, oriented X3, cranial nerves II-XII grossly intact, moves all extremities Psych: Patient is not psychotic, no suicidal or hemocidal ideation.  Labs on Admission: I have personally reviewed following labs and imaging studies  CBC: Recent Labs  Lab 04/25/23 1719  WBC 9.4  HGB 11.1*  HCT 34.2*  MCV 102.7*  PLT 213   Basic Metabolic Panel: Recent Labs  Lab 04/25/23 1821  NA 132*  K 5.5*  CL 94*  CO2 28  GLUCOSE 102*  BUN 28*  CREATININE 1.41*  CALCIUM 9.4   GFR: Estimated Creatinine Clearance: 36.6 mL/min (A) (by C-G formula based on SCr of 1.41 mg/dL (H)). Liver Function Tests: No results for input(s): "AST", "ALT", "ALKPHOS", "BILITOT", "PROT", "ALBUMIN" in the last 168 hours. No results for input(s): "LIPASE", "AMYLASE" in the last 168 hours. No results for input(s): "AMMONIA" in the last 168 hours. Coagulation Profile: Recent Labs  Lab 04/25/23 1719  INR 1.0   Cardiac Enzymes: No results for input(s): "CKTOTAL", "CKMB", "CKMBINDEX", "TROPONINI" in the last 168 hours. BNP (last 3 results) No results for input(s): "PROBNP" in the last 8760 hours. HbA1C: No results for input(s): "HGBA1C" in the last 72 hours. CBG: Recent Labs  Lab 04/25/23 2036  GLUCAP 179*   Lipid Profile: No results for input(s): "CHOL", "HDL", "LDLCALC", "TRIG", "CHOLHDL", "LDLDIRECT" in the last 72 hours. Thyroid Function Tests: No results for input(s): "TSH", "T4TOTAL", "FREET4", "T3FREE", "THYROIDAB" in the last 72 hours. Anemia Panel: No results for input(s): "VITAMINB12", "FOLATE", "FERRITIN", "TIBC", "IRON", "RETICCTPCT" in the last 72 hours. Urine analysis:    Component Value  Date/Time   COLORURINE YELLOW (A) 06/28/2022 1005   APPEARANCEUR HAZY (A) 06/28/2022 1005   APPEARANCEUR Hazy 03/08/2013 1341   LABSPEC 1.030 06/28/2022 1005   LABSPEC 1.019 03/08/2013 1341   PHURINE 5.0 06/28/2022 1005   GLUCOSEU NEGATIVE 06/28/2022 1005   GLUCOSEU Negative 03/08/2013 1341   HGBUR NEGATIVE 06/28/2022 1005   BILIRUBINUR NEGATIVE 06/28/2022 1005   BILIRUBINUR Negative 03/08/2013 1341  KETONESUR NEGATIVE 06/28/2022 1005   PROTEINUR NEGATIVE 06/28/2022 1005   NITRITE NEGATIVE 06/28/2022 1005   LEUKOCYTESUR NEGATIVE 06/28/2022 1005   LEUKOCYTESUR Negative 03/08/2013 1341   Sepsis Labs: @LABRCNTIP (procalcitonin:4,lacticidven:4) )No results found for this or any previous visit (from the past 240 hour(s)).   Radiological Exams on Admission: CT Angio Chest Pulmonary Embolism (PE) W or WO Contrast  Result Date: 04/25/2023 CLINICAL DATA:  Recent fall with left-sided chest pain, initial encounter EXAM: CT ANGIOGRAPHY CHEST WITH CONTRAST TECHNIQUE: Multidetector CT imaging of the chest was performed using the standard protocol during bolus administration of intravenous contrast. Multiplanar CT image reconstructions and MIPs were obtained to evaluate the vascular anatomy. RADIATION DOSE REDUCTION: This exam was performed according to the departmental dose-optimization program which includes automated exposure control, adjustment of the mA and/or kV according to patient size and/or use of iterative reconstruction technique. CONTRAST:  75mL OMNIPAQUE IOHEXOL 350 MG/ML SOLN COMPARISON:  Chest x-ray from earlier the same day, 10/02/2022. FINDINGS: Cardiovascular: Atherosclerotic calcifications of the thoracic aorta are noted. No aneurysmal dilatation is seen. No cardiac enlargement is noted. The pulmonary artery is well visualized within normal branching pattern bilaterally. No filling defect to suggest pulmonary embolism is noted. Mediastinum/Nodes: Thoracic inlet is within normal limits.  The esophagus as visualized is unremarkable. No hilar or mediastinal adenopathy is noted. Scattered calcifications are noted in the hilar nodes likely related to prior granulomatous disease. Lungs/Pleura: Emphysematous changes are seen similar to that noted on the prior exam. Cavitary lesions are seen in the left upper lobe and lingula stable from the prior exam. No parenchymal nodule is seen. No effusion or focal infiltrate is seen. Upper Abdomen: No acute abnormality. Musculoskeletal: Degenerative changes of the thoracic spine are noted. No acute rib abnormality is noted. Scoliosis concave to the left is noted in the thoracic spine. Review of the MIP images confirms the above findings. IMPRESSION: No evidence of pulmonary emboli. Chronic emphysematous changes with cavitary lesion stable from the prior exam. No acute rib abnormality is noted. Aortic Atherosclerosis (ICD10-I70.0) and Emphysema (ICD10-J43.9). Electronically Signed   By: Alcide Clever M.D.   On: 04/25/2023 23:32   DG Knee Complete 4 Views Left  Result Date: 04/25/2023 CLINICAL DATA:  Fall on left side, left hip pain. EXAM: LEFT KNEE - COMPLETE 4+ VIEW COMPARISON:  None Available. FINDINGS: No evidence of fracture, dislocation, or joint effusion. The alignment and joint spaces are preserved. There is chondrocalcinosis. No evidence of arthropathy or other focal bone abnormality. Soft tissues are unremarkable. IMPRESSION: 1. No fracture or dislocation of the left knee. 2. Chondrocalcinosis. Electronically Signed   By: Narda Rutherford M.D.   On: 04/25/2023 18:54   DG Chest Port 1 View  Result Date: 04/25/2023 CLINICAL DATA:  Shortness of breath.  Fall on left side. EXAM: PORTABLE CHEST 1 VIEW COMPARISON:  Radiograph 07/15/2022, CT 10/02/2022 FINDINGS: Emphysema with chronic left perihilar scarring. Previous cavitary lesion in the left lung on prior CT is less well-defined on the current exam. Chronic subpleural reticular markings at the right  lung base. There is no acute airspace disease, pleural effusion, or pneumothorax. The heart is normal in size. Prominent thoracic scoliosis. IMPRESSION: 1. No acute abnormality. 2. Chronic lung disease. Emphysema with chronic left perihilar scarring. Electronically Signed   By: Narda Rutherford M.D.   On: 04/25/2023 18:54   DG Hip Unilat W or Wo Pelvis 2-3 Views Left  Result Date: 04/25/2023 CLINICAL DATA:  Fall with left hip pain. EXAM: DG HIP (  WITH OR WITHOUT PELVIS) 2-3V LEFT COMPARISON:  None Available. FINDINGS: Displaced intertrochanteric left proximal femur fracture. The femoral head is well seated, no hip dislocation. Intact bony pelvis, including pubic rami. No pubic symphyseal or sacroiliac diastasis. IMPRESSION: Displaced intertrochanteric left proximal femur fracture. Electronically Signed   By: Narda Rutherford M.D.   On: 04/25/2023 18:51      Assessment/Plan Principal Problem:   Closed left hip fracture (HCC) Active Problems:   Fall at home, initial encounter   COPD with acute exacerbation (HCC)   Liver cirrhosis (HCC)   Chest pain   Hyperkalemia   AKI (acute kidney injury) (HCC)   PUD (peptic ulcer disease)   Essential hypertension   Depression with anxiety   Assessment and Plan:  Closed left hip fracture (HCC):  X-ray of left hip/pelvis that showed displaced intertrochanteric left proximal femur fracture. No neurovascular compromise. Orthopedic surgeon, Dr. Hyacinth Meeker was consulted by EDP. Since pt has COPD exacerbation, I cannot clear her for surgery tonight.  Patient needs to be reevaluated in the morning to decide if she can safely have surgery.  - will admit to tele bed - Pain control: prn morphine, percocet and tylenol - When necessary Zofran for nausea - Robaxin for muscle spasm - Lidoderm patch for pain - type and cross - INR/PTT  Fall at home, initial encounter - PT/OT when able to (not ordered now)  COPD with acute exacerbation [facility]): Patient has worsening  shortness of breath, wheezing, due to COPD exacerbation.  Patient has increased oxygen requirement from 2 L to 3 L.  CTA negative for PE -Bronchodilators -As needed Mucinex -Start Z-Pak -Solu-Medrol 40 mg twice daily (patient received 125 mg of Solu-Medrol)  History of liver cirrhosis (HCC): Mental status normal. -Check ammonia level -Check INR/PTT  Chest pain: Patient has pleuritic chest pain.  Troponin negative x 2.  CTA negative for PE. -Supportive care -Pain control as above -Check A1c, FLP  Hyperkalemia: Potassium 5.5 -Treated with D50 and NovoLog 8 unit -Leukoma 10 g  AKI (acute kidney injury) (HCC): -IV fluid: 75 cc/h  PUD (peptic ulcer disease) -Protonix  Essential hypertension -IV hydralazine as needed -Metoprolol  Depression with anxiety -Seroquel, Lamictal, Cymbalta       DVT ppx: SCD  Code Status: Full code   Family Communication: not done, no family member is at bed side.   Disposition Plan:  Anticipate discharge back to previous environment  Consults called:   Dr. Hyacinth Meeker of Ortho is consulted.  Admission status and Level of care: Telemetry Medical:    as inpt      Dispo: The patient is from: Home              Anticipated d/c is to: Home              Anticipated d/c date is: 2 days              Patient currently is not medically stable to d/c.    Severity of Illness:  The appropriate patient status for this patient is INPATIENT. Inpatient status is judged to be reasonable and necessary in order to provide the required intensity of service to ensure the patient's safety. The patient's presenting symptoms, physical exam findings, and initial radiographic and laboratory data in the context of their chronic comorbidities is felt to place them at high risk for further clinical deterioration. Furthermore, it is not anticipated that the patient will be medically stable for discharge from the hospital within 2 midnights  of admission.   * I certify  that at the point of admission it is my clinical judgment that the patient will require inpatient hospital care spanning beyond 2 midnights from the point of admission due to high intensity of service, high risk for further deterioration and high frequency of surveillance required.*       Date of Service 04/26/2023    Lorretta Harp [facility]   If 7PM-7AM, please contact night-coverage [email or web address] 04/26/2023, 1:09 AM

## 2023-04-26 ENCOUNTER — Inpatient Hospital Stay: Payer: Medicare Other | Admitting: Anesthesiology

## 2023-04-26 ENCOUNTER — Inpatient Hospital Stay: Payer: Medicare Other

## 2023-04-26 ENCOUNTER — Encounter
Admission: EM | Disposition: A | Discharge status: Skilled Nursing Facility | Payer: Self-pay | Source: Home / Self Care | Attending: Internal Medicine

## 2023-04-26 ENCOUNTER — Other Ambulatory Visit: Payer: Self-pay

## 2023-04-26 DIAGNOSIS — J441 Chronic obstructive pulmonary disease with (acute) exacerbation: Secondary | ICD-10-CM | POA: Diagnosis not present

## 2023-04-26 DIAGNOSIS — S72002A Fracture of unspecified part of neck of left femur, initial encounter for closed fracture: Secondary | ICD-10-CM | POA: Diagnosis not present

## 2023-04-26 DIAGNOSIS — N179 Acute kidney failure, unspecified: Secondary | ICD-10-CM | POA: Diagnosis not present

## 2023-04-26 HISTORY — PX: INTRAMEDULLARY (IM) NAIL INTERTROCHANTERIC: SHX5875

## 2023-04-26 LAB — URINALYSIS, ROUTINE W REFLEX MICROSCOPIC
Bilirubin Urine: NEGATIVE
Glucose, UA: 50 mg/dL — AB
Hgb urine dipstick: NEGATIVE
Ketones, ur: NEGATIVE mg/dL
Nitrite: NEGATIVE
Protein, ur: NEGATIVE mg/dL
Specific Gravity, Urine: 1.009 (ref 1.005–1.030)
pH: 5 (ref 5.0–8.0)

## 2023-04-26 LAB — BASIC METABOLIC PANEL
Anion gap: 8 (ref 5–15)
BUN: 26 mg/dL — ABNORMAL HIGH (ref 8–23)
CO2: 21 mmol/L — ABNORMAL LOW (ref 22–32)
Calcium: 7.8 mg/dL — ABNORMAL LOW (ref 8.9–10.3)
Chloride: 100 mmol/L (ref 98–111)
Creatinine, Ser: 1.26 mg/dL — ABNORMAL HIGH (ref 0.44–1.00)
GFR, Estimated: 47 mL/min — ABNORMAL LOW (ref >60)
Glucose, Bld: 147 mg/dL — ABNORMAL HIGH (ref 70–99)
Potassium: 3.5 mmol/L (ref 3.5–5.1)
Sodium: 129 mmol/L — ABNORMAL LOW (ref 135–145)

## 2023-04-26 LAB — LIPID PANEL
Cholesterol: 121 mg/dL (ref 0–200)
HDL: 49 mg/dL (ref >40)
LDL Cholesterol: 62 mg/dL (ref 0–99)
Total CHOL/HDL Ratio: 2.5 {ratio}
Triglycerides: 48 mg/dL (ref <150)
VLDL: 10 mg/dL (ref 0–40)

## 2023-04-26 LAB — CBC
HCT: 31.3 % — ABNORMAL LOW (ref 36.0–46.0)
Hemoglobin: 10.2 g/dL — ABNORMAL LOW (ref 12.0–15.0)
MCH: 33.3 pg (ref 26.0–34.0)
MCHC: 32.6 g/dL (ref 30.0–36.0)
MCV: 102.3 fL — ABNORMAL HIGH (ref 80.0–100.0)
Platelets: 179 10*3/uL (ref 150–400)
RBC: 3.06 MIL/uL — ABNORMAL LOW (ref 3.87–5.11)
RDW: 13.2 % (ref 11.5–15.5)
WBC: 6.5 10*3/uL (ref 4.0–10.5)
nRBC: 0 % (ref 0.0–0.2)

## 2023-04-26 LAB — SARS CORONAVIRUS 2 BY RT PCR: SARS Coronavirus 2 by RT PCR: NEGATIVE

## 2023-04-26 LAB — AMMONIA: Ammonia: 16 umol/L (ref 9–35)

## 2023-04-26 LAB — HEMOGLOBIN A1C
Hgb A1c MFr Bld: 4.5 % — ABNORMAL LOW (ref 4.8–5.6)
Mean Plasma Glucose: 82.45 mg/dL

## 2023-04-26 SURGERY — FIXATION, FRACTURE, INTERTROCHANTERIC, WITH INTRAMEDULLARY ROD
Anesthesia: General | Site: Hip | Laterality: Left

## 2023-04-26 MED ORDER — 0.9 % SODIUM CHLORIDE (POUR BTL) OPTIME
TOPICAL | Status: DC | PRN
  Administered 2023-04-26: 1000 mL

## 2023-04-26 MED ORDER — OXYCODONE HCL 5 MG PO TABS
ORAL_TABLET | ORAL | Status: AC
  Filled 2023-04-26: qty 1

## 2023-04-26 MED ORDER — ENOXAPARIN SODIUM 30 MG/0.3ML IJ SOSY
30.0000 mg | PREFILLED_SYRINGE | INTRAMUSCULAR | Status: DC
  Administered 2023-04-27 – 2023-05-02 (×6): 30 mg via SUBCUTANEOUS
  Filled 2023-04-26 (×6): qty 0.3

## 2023-04-26 MED ORDER — FENTANYL CITRATE (PF) 100 MCG/2ML IJ SOLN
25.0000 ug | INTRAMUSCULAR | Status: DC | PRN
  Administered 2023-04-26 (×3): 50 ug via INTRAVENOUS

## 2023-04-26 MED ORDER — FENTANYL CITRATE (PF) 100 MCG/2ML IJ SOLN
INTRAMUSCULAR | Status: AC
  Filled 2023-04-26: qty 2

## 2023-04-26 MED ORDER — MIDAZOLAM HCL 5 MG/5ML IJ SOLN
INTRAMUSCULAR | Status: DC | PRN
  Administered 2023-04-26: 2 mg via INTRAVENOUS

## 2023-04-26 MED ORDER — SODIUM CHLORIDE 1 G PO TABS
1.0000 g | ORAL_TABLET | Freq: Three times a day (TID) | ORAL | Status: DC
  Administered 2023-04-26 – 2023-04-28 (×6): 1 g via ORAL
  Filled 2023-04-26 (×6): qty 1

## 2023-04-26 MED ORDER — CEFAZOLIN SODIUM-DEXTROSE 2-4 GM/100ML-% IV SOLN
INTRAVENOUS | Status: AC
  Filled 2023-04-26: qty 100

## 2023-04-26 MED ORDER — SODIUM CHLORIDE 0.9% FLUSH
3.0000 mL | Freq: Two times a day (BID) | INTRAVENOUS | Status: DC
  Administered 2023-04-26 – 2023-05-02 (×11): 3 mL via INTRAVENOUS

## 2023-04-26 MED ORDER — HYDROCODONE-ACETAMINOPHEN 5-325 MG PO TABS
1.0000 | ORAL_TABLET | ORAL | Status: DC | PRN
Start: 2023-04-26 — End: 2023-04-29
  Administered 2023-04-26 – 2023-04-27 (×3): 2 via ORAL
  Administered 2023-04-27: 1 via ORAL
  Administered 2023-04-27: 2 via ORAL
  Administered 2023-04-27 – 2023-04-28 (×2): 1 via ORAL
  Administered 2023-04-28: 2 via ORAL
  Administered 2023-04-28: 1 via ORAL
  Administered 2023-04-29: 2 via ORAL
  Filled 2023-04-26: qty 2
  Filled 2023-04-26: qty 1
  Filled 2023-04-26 (×3): qty 2
  Filled 2023-04-26 (×2): qty 1
  Filled 2023-04-26 (×2): qty 2
  Filled 2023-04-26 (×2): qty 1
  Filled 2023-04-26: qty 2

## 2023-04-26 MED ORDER — PHENOL 1.4 % MT LIQD: 1.0000 | OROMUCOSAL | Status: DC | PRN

## 2023-04-26 MED ORDER — CEFAZOLIN SODIUM-DEXTROSE 2-4 GM/100ML-% IV SOLN
2.0000 g | Freq: Three times a day (TID) | INTRAVENOUS | Status: AC
  Administered 2023-04-26 – 2023-04-27 (×3): 2 g via INTRAVENOUS
  Filled 2023-04-26 (×3): qty 100

## 2023-04-26 MED ORDER — TRANEXAMIC ACID-NACL 1000-0.7 MG/100ML-% IV SOLN
1000.0000 mg | Freq: Once | INTRAVENOUS | Status: AC
  Administered 2023-04-26: 1000 mg via INTRAVENOUS
  Filled 2023-04-26: qty 100

## 2023-04-26 MED ORDER — DEXAMETHASONE SODIUM PHOSPHATE 10 MG/ML IJ SOLN
INTRAMUSCULAR | Status: DC | PRN
  Administered 2023-04-26: 10 mg via INTRAVENOUS

## 2023-04-26 MED ORDER — ALUM & MAG HYDROXIDE-SIMETH 200-200-20 MG/5ML PO SUSP: 30.0000 mL | ORAL | Status: DC | PRN

## 2023-04-26 MED ORDER — ENSURE ENLIVE PO LIQD
237.0000 mL | Freq: Two times a day (BID) | ORAL | Status: DC
  Administered 2023-04-27 – 2023-05-02 (×9): 237 mL via ORAL

## 2023-04-26 MED ORDER — METOCLOPRAMIDE HCL 10 MG PO TABS: 5.0000 mg | ORAL_TABLET | Freq: Three times a day (TID) | ORAL | Status: DC | PRN

## 2023-04-26 MED ORDER — SODIUM CHLORIDE 0.45 % IV SOLN: INTRAVENOUS | Status: DC

## 2023-04-26 MED ORDER — HYDROMORPHONE HCL 1 MG/ML IJ SOLN
0.5000 mg | INTRAMUSCULAR | Status: DC | PRN
Start: 2023-04-26 — End: 2023-04-26
  Administered 2023-04-26: 0.5 mg via INTRAVENOUS

## 2023-04-26 MED ORDER — BUPIVACAINE-EPINEPHRINE (PF) 0.25% -1:200000 IJ SOLN
INTRAMUSCULAR | Status: AC
  Filled 2023-04-26: qty 30

## 2023-04-26 MED ORDER — HYDROMORPHONE HCL 1 MG/ML IJ SOLN
INTRAMUSCULAR | Status: AC
  Filled 2023-04-26: qty 1

## 2023-04-26 MED ORDER — FLEET ENEMA RE ENEM
1.0000 | ENEMA | Freq: Once | RECTAL | Status: DC | PRN
Start: 2023-04-26 — End: 2023-05-02

## 2023-04-26 MED ORDER — PHENYLEPHRINE 80 MCG/ML (10ML) SYRINGE FOR IV PUSH (FOR BLOOD PRESSURE SUPPORT)
PREFILLED_SYRINGE | INTRAVENOUS | Status: DC | PRN
  Administered 2023-04-26: 80 ug via INTRAVENOUS

## 2023-04-26 MED ORDER — FENTANYL CITRATE (PF) 100 MCG/2ML IJ SOLN
INTRAMUSCULAR | Status: DC | PRN
  Administered 2023-04-26 (×3): 25 ug via INTRAVENOUS

## 2023-04-26 MED ORDER — ACETAMINOPHEN 10 MG/ML IV SOLN
INTRAVENOUS | Status: DC | PRN
  Administered 2023-04-26: 1000 mg via INTRAVENOUS

## 2023-04-26 MED ORDER — FERROUS SULFATE 325 (65 FE) MG PO TABS
325.0000 mg | ORAL_TABLET | Freq: Every day | ORAL | Status: DC
Start: 2023-04-27 — End: 2023-05-02
  Administered 2023-04-27 – 2023-05-02 (×6): 325 mg via ORAL
  Filled 2023-04-26 (×6): qty 1

## 2023-04-26 MED ORDER — GLYCOPYRROLATE 0.2 MG/ML IJ SOLN
INTRAMUSCULAR | Status: DC | PRN
  Administered 2023-04-26: .2 mg via INTRAVENOUS

## 2023-04-26 MED ORDER — KETAMINE HCL 50 MG/5ML IJ SOSY
PREFILLED_SYRINGE | INTRAMUSCULAR | Status: DC | PRN
Start: 2023-04-26 — End: 2023-04-26
  Administered 2023-04-26 (×5): 10 mg via INTRAVENOUS

## 2023-04-26 MED ORDER — SENNA 8.6 MG PO TABS
1.0000 | ORAL_TABLET | Freq: Two times a day (BID) | ORAL | Status: DC
  Administered 2023-04-26 – 2023-05-01 (×9): 8.6 mg via ORAL
  Filled 2023-04-26 (×13): qty 1

## 2023-04-26 MED ORDER — BUPIVACAINE-EPINEPHRINE (PF) 0.5% -1:200000 IJ SOLN
INTRAMUSCULAR | Status: AC
  Filled 2023-04-26: qty 30

## 2023-04-26 MED ORDER — PROPOFOL 10 MG/ML IV BOLUS
INTRAVENOUS | Status: DC | PRN
  Administered 2023-04-26 (×5): 20 mg via INTRAVENOUS

## 2023-04-26 MED ORDER — SODIUM CHLORIDE 0.9 % IR SOLN
Status: DC | PRN
  Administered 2023-04-26: 250 mL

## 2023-04-26 MED ORDER — OXYCODONE HCL 5 MG/5ML PO SOLN: 5.0000 mg | Freq: Once | ORAL | Status: AC | PRN

## 2023-04-26 MED ORDER — ACETAMINOPHEN 10 MG/ML IV SOLN
INTRAVENOUS | Status: AC
  Filled 2023-04-26: qty 100

## 2023-04-26 MED ORDER — IPRATROPIUM-ALBUTEROL 0.5-2.5 (3) MG/3ML IN SOLN
3.0000 mL | RESPIRATORY_TRACT | Status: DC
  Administered 2023-04-26: 3 mL via RESPIRATORY_TRACT

## 2023-04-26 MED ORDER — TRANEXAMIC ACID-NACL 1000-0.7 MG/100ML-% IV SOLN
INTRAVENOUS | Status: AC
  Filled 2023-04-26: qty 100

## 2023-04-26 MED ORDER — ONDANSETRON HCL 4 MG/2ML IJ SOLN
INTRAMUSCULAR | Status: DC | PRN
  Administered 2023-04-26: 4 mg via INTRAVENOUS

## 2023-04-26 MED ORDER — IPRATROPIUM-ALBUTEROL 0.5-2.5 (3) MG/3ML IN SOLN
RESPIRATORY_TRACT | Status: AC
  Filled 2023-04-26: qty 3

## 2023-04-26 MED ORDER — KETAMINE HCL 50 MG/5ML IJ SOSY
PREFILLED_SYRINGE | INTRAMUSCULAR | Status: AC
  Filled 2023-04-26: qty 5

## 2023-04-26 MED ORDER — BUPIVACAINE-EPINEPHRINE (PF) 0.25% -1:200000 IJ SOLN
INTRAMUSCULAR | Status: DC | PRN
  Administered 2023-04-26: 47 mL via PERINEURAL

## 2023-04-26 MED ORDER — PROPOFOL 500 MG/50ML IV EMUL
INTRAVENOUS | Status: DC | PRN
  Administered 2023-04-26: 100 ug/kg/min via INTRAVENOUS

## 2023-04-26 MED ORDER — GENTAMICIN SULFATE 40 MG/ML IJ SOLN
INTRAMUSCULAR | Status: AC
  Filled 2023-04-26: qty 2

## 2023-04-26 MED ORDER — MIDAZOLAM HCL 2 MG/2ML IJ SOLN
INTRAMUSCULAR | Status: AC
  Filled 2023-04-26: qty 2

## 2023-04-26 MED ORDER — OXYCODONE HCL 5 MG PO TABS
5.0000 mg | ORAL_TABLET | Freq: Once | ORAL | Status: AC | PRN
  Administered 2023-04-26: 5 mg via ORAL

## 2023-04-26 MED ORDER — BISACODYL 10 MG RE SUPP: 10.0000 mg | Freq: Every day | RECTAL | Status: DC | PRN

## 2023-04-26 MED ORDER — METOCLOPRAMIDE HCL 5 MG/ML IJ SOLN: 5.0000 mg | Freq: Three times a day (TID) | INTRAMUSCULAR | Status: DC | PRN

## 2023-04-26 MED ORDER — SODIUM CHLORIDE 0.9% FLUSH
3.0000 mL | Freq: Two times a day (BID) | INTRAVENOUS | Status: DC
  Administered 2023-04-26: 3 mL via INTRAVENOUS

## 2023-04-26 MED ORDER — PHENYLEPHRINE 80 MCG/ML (10ML) SYRINGE FOR IV PUSH (FOR BLOOD PRESSURE SUPPORT)
PREFILLED_SYRINGE | INTRAVENOUS | Status: AC
  Filled 2023-04-26: qty 10

## 2023-04-26 MED ORDER — MENTHOL 3 MG MT LOZG: 1.0000 | LOZENGE | OROMUCOSAL | Status: DC | PRN

## 2023-04-26 SURGICAL SUPPLY — 45 items
APL PRP STRL LF DISP 70% ISPRP (MISCELLANEOUS) ×2
BIT DRILL CANN 16 HIP (BIT) IMPLANT
BIT DRILL CANN STP 6/9 HIP (BIT) IMPLANT
BIT DRILL LONG 4.2 (BIT) IMPLANT
BLADE HELICAL TFNA 105 (Anchor) IMPLANT
BNDG CMPR 5X4 CHSV STRCH STRL (GAUZE/BANDAGES/DRESSINGS) ×1
BNDG COHESIVE 4X5 TAN STRL LF (GAUZE/BANDAGES/DRESSINGS) ×1 IMPLANT
CHLORAPREP W/TINT 26 (MISCELLANEOUS) ×2 IMPLANT
DRAPE C-ARMOR (DRAPES) IMPLANT
DRAPE INCISE 23X17 STRL (DRAPES) ×1 IMPLANT
DRAPE INCISE IOBAN 23X17 STRL (DRAPES) ×1 IMPLANT
DRSG AQUACEL AG ADV 3.5X10 (GAUZE/BANDAGES/DRESSINGS) IMPLANT
DRSG AQUACEL AG ADV 3.5X14 (GAUZE/BANDAGES/DRESSINGS) IMPLANT
ELECT REM PT RETURN 9FT ADLT (ELECTROSURGICAL) ×1
ELECTRODE REM PT RTRN 9FT ADLT (ELECTROSURGICAL) ×1 IMPLANT
GAUZE SPONGE 4X4 12PLY STRL (GAUZE/BANDAGES/DRESSINGS) ×1 IMPLANT
GAUZE XEROFORM 1X8 LF (GAUZE/BANDAGES/DRESSINGS) IMPLANT
GLOVE INDICATOR 8.0 STRL GRN (GLOVE) ×1 IMPLANT
GLOVE SURG ORTHO 8.5 STRL (GLOVE) ×1 IMPLANT
GOWN STRL REUS W/ TWL LRG LVL3 (GOWN DISPOSABLE) ×1 IMPLANT
GOWN STRL REUS W/TWL LRG LVL3 (GOWN DISPOSABLE) ×1
GOWN STRL REUS W/TWL LRG LVL4 (GOWN DISPOSABLE) ×1 IMPLANT
GUIDEWIRE 3.2X400 (WIRE) IMPLANT
IMPL DEG TI CANN 11MM/130 (Orthopedic Implant) IMPLANT
IMPLANT DEG TI CANN 11MM/130 (Orthopedic Implant) ×1 IMPLANT
KIT TURNOVER KIT A (KITS) ×1 IMPLANT
MANIFOLD NEPTUNE II (INSTRUMENTS) ×1 IMPLANT
MAT ABSORB FLUID 56X50 GRAY (MISCELLANEOUS) ×1 IMPLANT
NDL SPNL 18GX3.5 QUINCKE PK (NEEDLE) ×1 IMPLANT
NEEDLE SPNL 18GX3.5 QUINCKE PK (NEEDLE) ×1 IMPLANT
NS IRRIG 500ML POUR BTL (IV SOLUTION) ×1 IMPLANT
PACK HIP COMPR (MISCELLANEOUS) ×1 IMPLANT
PAD ABD DERMACEA PRESS 5X9 (GAUZE/BANDAGES/DRESSINGS) IMPLANT
SCREW LOCK STAR 5X36 (Screw) IMPLANT
SOL PREP PVP 2OZ (MISCELLANEOUS) ×1
SOLUTION PREP PVP 2OZ (MISCELLANEOUS) ×1 IMPLANT
STAPLER SKIN PROX 35W (STAPLE) ×1 IMPLANT
SUCTION TUBE FRAZIER 10FR DISP (SUCTIONS) ×1 IMPLANT
SUT VIC AB 0 CT1 36 (SUTURE) ×1 IMPLANT
SUT VIC AB 2-0 CT1 27 (SUTURE) ×1
SUT VIC AB 2-0 CT1 TAPERPNT 27 (SUTURE) ×1 IMPLANT
SYR 30ML LL (SYRINGE) ×1 IMPLANT
TAPE MICROFOAM 4IN (TAPE) IMPLANT
TRAP FLUID SMOKE EVACUATOR (MISCELLANEOUS) ×1 IMPLANT
WATER STERILE IRR 500ML POUR (IV SOLUTION) ×1 IMPLANT

## 2023-04-26 NOTE — Anesthesia Procedure Notes (Signed)
Spinal  Patient location during procedure: OR Start time: 04/26/2023 11:20 AM End time: 04/26/2023 11:46 AM Reason for block: surgical anesthesia Staffing Performed: anesthesiologist and resident/CRNA  Anesthesiologist: Stephanie Coup, MD Resident/CRNA: Stormy Fabian, CRNA Performed by: Stormy Fabian, CRNA Authorized by: Stephanie Coup, MD   Preanesthetic Checklist Completed: patient identified, IV checked, site marked, risks and benefits discussed, surgical consent, monitors and equipment checked, pre-op evaluation and timeout performed Spinal Block Patient position: sitting Prep: ChloraPrep Patient monitoring: heart rate, continuous pulse ox, blood pressure and cardiac monitor Approach: midline Location: L3-4 Injection technique: single-shot Needle Needle type: Whitacre and Introducer  Needle gauge: 24 G Needle length: 9 cm Assessment Sensory level: T10 Events: CSF return Additional Notes Sterile aseptic technique used throughout the procedure . Expiration date of kit checked and confirmed. With multiple attempts at the placement of the spinal by MDA and CRNA, we were to place the spinal anesthetic. Patient tolerated procedure well, without complications.

## 2023-04-26 NOTE — Hospital Course (Addendum)
Regina Bernard is a 67 y.o. female with medical history significant of COPD on 2L O2, HTN, GI bleeding, PUD, depression with anxiety, polysubstance abuse, chronic pain, liver cirrhosis, breast cancer, who presents with shortness of breath, chest pain, fall and left hip pain.  INTERTROCHANTERIC left hip nail performed on 10/19.  10/23.  Patient does complain of some shortness of breath and wheezing.  She states she always wheezes.  She has not had her inhaler.  Starting Solu-Medrol. 10/24.  Hemoglobin 7.8 will give a unit of blood.  Lungs sound better today.  Continue Solu-Medrol and inhalers 10/25.  Hemoglobin up to 10.2 after transfusion yesterday.  Still having pain in her left hip.  Breathing is better.

## 2023-04-26 NOTE — Plan of Care (Signed)
Patient to floor post OP.  Pain controled with PO. Vitals stable and site looks good. Bed rest till AM with PT

## 2023-04-26 NOTE — Progress Notes (Signed)
Initial Nutrition Assessment  DOCUMENTATION CODES:   Not applicable  INTERVENTION:  Once diet resumes, recommend: Regular diet  Ensure Enlive po BID, each supplement provides 350 kcal and 20 grams of protein. MVI with minerals daily  NUTRITION DIAGNOSIS:   Increased nutrient needs related to hip fracture as evidenced by estimated needs.  GOAL:   Patient will meet greater than or equal to 90% of their needs   MONITOR:   Diet advancement, Labs, Weight trends, Supplement acceptance, Skin  REASON FOR ASSESSMENT:   Consult Hip fracture protocol  ASSESSMENT:   Pt admitted with SOB, chest pain, fall and L hip pain. PMH significant for COPD, HTN, GIB, PUD, depression and anxiety, polysubstance use, chronic pain, liver cirrhosis, breast cancer.  Xray on admission with findings of displaced intertrochanteric L proximal femur fracture. Ortho consult pending. Pt remains in NPO and in the ED. Unable to reach pt to obtain detailed nutrition related history.   Pt noted to have been followed outpatient throughout the last few months for ongoing spinal stenosis and chronic back pain. Question whether/if this has affected her ability to maintain adequate PO intake. Will initiate oral nutrition supplements in the setting of hip fracture and increased nutrition needs and attempt to obtain nutrition history on follow up as appropriate.   Unfortunately, there is limited weight history on file to review over the last year. Current weight is likely stated/pulled from 6/18 OP MD visit. Will continue to monitor trends throughout admission.   Medications: abx, solu-medrol, MVI, protonix, sodium chloride tablet TID  Labs: sodium 129, BUN  26, Cr 1.26, GFR 47, CBG 179  NUTRITION - FOCUSED PHYSICAL EXAM: RD working remotely. Deferred to follow up.   Diet Order:   Diet Order             Diet NPO time specified Except for: Ice Chips, Sips with Meds  Diet effective midnight                    EDUCATION NEEDS:   No education needs have been identified at this time  Skin:  Skin Assessment: Reviewed RN Assessment  Last BM:  unknown/PTA  Height:   Ht Readings from Last 1 Encounters:  12/24/22 5\' 6"  (1.676 m)    Weight:   Wt Readings from Last 1 Encounters:  04/25/23 59 kg   BMI:  Body mass index is 20.99 kg/m.  Estimated Nutritional Needs:   Kcal:  1600-1800  Protein:  80-95g  Fluid:  >/=1.6L  Drusilla Kanner, RDN, LDN Clinical Nutrition

## 2023-04-26 NOTE — H&P (Signed)
THE PATIENT WAS SEEN PRIOR TO SURGERY TODAY.  HISTORY, ALLERGIES, HOME MEDICATIONS AND OPERATIVE PROCEDURE WERE REVIEWED. RISKS AND BENEFITS OF SURGERY DISCUSSED WITH PATIENT AGAIN.  NO CHANGES FROM INITIAL HISTORY AND PHYSICAL NOTED.    

## 2023-04-26 NOTE — Anesthesia Preprocedure Evaluation (Addendum)
Anesthesia Evaluation  Patient identified by MRN, date of birth, ID band Patient awake    Reviewed: Allergy & Precautions, NPO status , Patient's Chart, lab work & pertinent test results  History of Anesthesia Complications Negative for: history of anesthetic complications  Airway Mallampati: II  TM Distance: >3 FB Neck ROM: full    Dental  (+) Dental Advidsory Given, Missing, Poor Dentition Multiple missing molars bilaterally  :   Pulmonary shortness of breath and with exertion, COPD,  COPD inhaler and oxygen dependent, Current Smoker and Patient abstained from smoking. 2l home O2   Pulmonary exam normal breath sounds clear to auscultation       Cardiovascular Exercise Tolerance: Good hypertension, Pt. on medications Normal cardiovascular exam Rhythm:Regular Rate:Normal     Neuro/Psych  PSYCHIATRIC DISORDERS Anxiety     negative neurological ROS     GI/Hepatic Neg liver ROS,GERD  Medicated,,(+) C  Endo/Other  negative endocrine ROS    Renal/GU Renal disease     Musculoskeletal   Abdominal   Peds  Hematology negative hematology ROS (+)   Anesthesia Other Findings Past Medical History: No date: Abnormal CT scan, chest No date: Anxiety No date: Asthma No date: Breast cancer [facility])     Comment:  Breast CA- Right  06/18/2011: Breast cancer (HCC)     Comment:  right breast cancer No date: COPD (chronic obstructive pulmonary disease) (HCC) No date: Endometriosis     Comment:  tx with vaginal hysterectomy No date: Fatty liver 1998: Hepatitis C     Comment:  [facility] trial treatment in-patient study 2005. HCV 740,               05/20/11. No date: History of substance abuse (HCC)     Comment:  Heroine, cocaine, marijuana. States all of them. Heavy               use 1995 to 2000. Occasional use before and after. None               since 2006.  No date: Hot flashes related to aromatase inhibitor therapy No date:  Hypertension No date: Lung mass No date: Peripheral neuropathy     Comment:  in hands and feet  Past Surgical History: 2013: BREAST LUMPECTOMY; Right 03/20/2022: COLONOSCOPY WITH PROPOFOL; N/A     Comment:  Procedure: COLONOSCOPY WITH PROPOFOL;  Surgeon: Toney Reil, MD;  Location: [facility] ENDOSCOPY;  Service:               Gastroenterology;  Laterality: N/A; 10/29/2021: ESOPHAGOGASTRODUODENOSCOPY (EGD) WITH PROPOFOL; N/A     Comment:  Procedure: ESOPHAGOGASTRODUODENOSCOPY (EGD) WITH               PROPOFOL;  Surgeon: Toney Reil, MD;  Location:               [facility] ENDOSCOPY;  Service: Gastroenterology;  Laterality:               N/A; 02/14/2022: ESOPHAGOGASTRODUODENOSCOPY (EGD) WITH PROPOFOL; N/A     Comment:  Procedure: ESOPHAGOGASTRODUODENOSCOPY (EGD) WITH               PROPOFOL;  Surgeon: Toney Reil, MD;  Location:               [facility] ENDOSCOPY;  Service: Gastroenterology;  Laterality:               N/A;  DRIVER 5 - 10 MINUTES AWAY 1996: TUBAL LIGATION No date: Upper right leg benign tumor removed 2001: VAGINAL HYSTERECTOMY  BMI    Body Mass Index: 20.99 kg/m      Reproductive/Obstetrics negative OB ROS                             Anesthesia Physical Anesthesia Plan  ASA: 3  Anesthesia Plan: General and Spinal   Post-op Pain Management:    Induction:   PONV Risk Score and Plan: 3 and Ondansetron, Dexamethasone, Propofol infusion, TIVA and Midazolam  Airway Management Planned: Natural Airway and Nasal Cannula  Additional Equipment:   Intra-op Plan:   Post-operative Plan:   Informed Consent: I have reviewed the patients History and Physical, chart, labs and discussed the procedure including the risks, benefits and alternatives for the proposed anesthesia with the patient or authorized representative who has indicated his/her understanding and acceptance.     Dental Advisory Given  Plan Discussed with:  Anesthesiologist, CRNA and Surgeon  Anesthesia Plan Comments: (Patient reports no bleeding problems and no anticoagulant use.  Plan for spinal with backup GA  Patient consented for risks of anesthesia including but not limited to:  - adverse reactions to medications - damage to eyes, teeth, lips or other oral mucosa - nerve damage due to positioning  - risk of bleeding, infection and or nerve damage from spinal that could lead to paralysis - risk of headache or failed spinal - damage to teeth, lips or other oral mucosa - sore throat or hoarseness - damage to heart, brain, nerves, lungs, other parts of body or loss of life  Patient voiced understanding and assent.)       Anesthesia Quick Evaluation

## 2023-04-26 NOTE — Op Note (Signed)
DATE OF SURGERY:  04/26/2023  TIME: 12:51 PM  PATIENT NAME:  Regina Bernard  AGE: 67 y.o.  PRE-OPERATIVE DIAGNOSIS: Displaced intertrochanteric fracture left hip  POST-OPERATIVE DIAGNOSIS:  SAME  PROCEDURE:  INTRAMEDULLARY (IM) NAIL INTERTROCHANTERIC left hip  SURGEON:  Valinda Hoar  ASST:  EBL: 20 cc  COMPLICATIONS: None  OPERATIVE IMPLANTS: Synthes trochanteric femoral nail 11 mm / 130 degrees with interlocking helical blade 105  mm and distal locking screw  36 mm.  PREOPERATIVE INDICATIONS:  [name] is a 67 y.o. year old who fell and suffered a hip fracture. She was brought into the ER and then admitted and optimized and then elected for surgical intervention.    The risks benefits and alternatives were discussed with the patient including but not limited to the risks of nonoperative treatment, versus surgical intervention including infection, bleeding, nerve injury, malunion, nonunion, hardware prominence, hardware failure, need for hardware removal, blood clots, cardiopulmonary complications, morbidity, mortality, among others, and they were willing to proceed.    OPERATIVE PROCEDURE:  The patient was brought to the operating room and placed in the supine position.  Local 1% Marcaine with epinephrine and MAC anesthesia was administered, with a foley. She was placed on the fracture table.  Closed reduction was performed under C-arm guidance. The length of the femur was also measured using fluoroscopy. Time out was then performed after sterile prep and drape. She received preoperative antibiotics.  Incision was made proximal to the greater trochanter. A guidewire was placed in the appropriate position. Confirmation was made on AP and lateral views. The above-named nail was opened. I opened the proximal femur with a reamer. I then placed the nail by hand easily down. I did not need to ream the femur.  Once the nail was completely seated, I placed a guidepin into the  femoral head into the center center position through a second incision.  I measured the length, and then reamed the lateral cortex and up into the head. I then placed the helical blade. Slight compression was applied. Anatomic fixation achieved. Bone quality was mediocre.  I then secured the proximal interlock.  The distal locking screw was then placed and after confirming the position of the fracture fragments and hardware I then removed the instruments, and took final C-arm pictures AP and lateral the entire length of the leg. Anatomic reconstruction was achieved, and the wounds were irrigated copiously and closed with Vicryl  followed by staples and dry sterile dressing. Sponge and needle count were correct.   The patient was awakened and returned to PACU in stable and satisfactory condition. There no complications and the patient tolerated the procedure well.  She will be partial weightbearing as tolerated, and will be on Lovenox  For DVT prophylaxis.     Valinda Hoar, M.D.

## 2023-04-26 NOTE — Consult Note (Signed)
ORTHOPAEDIC CONSULTATION  REQUESTING PHYSICIAN: Marrion Coy, MD  Chief Complaint: Left hip pain  HPI: Regina Bernard is a 67 y.o. female who complains of left hip pain after a fall at home 2 nights ago.  She tripped over her oxygen tubing.  Her friend helped her back into her recliner but she was unable to get up and walk.  She was brought to the emergency room last night for exam and x-rays revealed a minimally displaced intertrochanteric/base neck fracture of the left proximal femur.  Patient is admitted for medical evaluation and surgery.  She has chronic pulmonary disease.  She has been cleared by the medical service for surgery today.  Risks and benefits of surgery been discussed with her.  She wished to proceed.  Past Medical History:  Diagnosis Date   Abnormal CT scan, chest    Anxiety    Asthma    Breast cancer (HCC)    Breast CA- Right    Breast cancer (HCC) 06/18/2011   right breast cancer   COPD (chronic obstructive pulmonary disease) (HCC)    Endometriosis    tx with vaginal hysterectomy   Fatty liver    Hepatitis C 1998   [facility] trial treatment in-patient study 2005. HCV 740, 05/20/11.   History of substance abuse (HCC)    Heroine, cocaine, marijuana. States all of them. Heavy use 1995 to 2000. Occasional use before and after. None since 2006.    Hot flashes related to aromatase inhibitor therapy    Hypertension    Lung mass    Peripheral neuropathy    in hands and feet   Past Surgical History:  Procedure Laterality Date   BREAST LUMPECTOMY Right 2013   COLONOSCOPY WITH PROPOFOL N/A 03/20/2022   Procedure: COLONOSCOPY WITH PROPOFOL;  Surgeon: Toney Reil, MD;  Location: [facility] ENDOSCOPY;  Service: Gastroenterology;  Laterality: N/A;   ESOPHAGOGASTRODUODENOSCOPY (EGD) WITH PROPOFOL N/A 10/29/2021   Procedure: ESOPHAGOGASTRODUODENOSCOPY (EGD) WITH PROPOFOL;  Surgeon: Toney Reil, MD;  Location: [facility] ENDOSCOPY;  Service: Gastroenterology;  Laterality:  N/A;   ESOPHAGOGASTRODUODENOSCOPY (EGD) WITH PROPOFOL N/A 02/14/2022   Procedure: ESOPHAGOGASTRODUODENOSCOPY (EGD) WITH PROPOFOL;  Surgeon: Toney Reil, MD;  Location: [facility] ENDOSCOPY;  Service: Gastroenterology;  Laterality: N/A;  DRIVER 5 - 10 MINUTES AWAY   TUBAL LIGATION  1996   Upper right leg benign tumor removed     VAGINAL HYSTERECTOMY  2001   Social History   Socioeconomic History   Marital status: Married    Spouse name: Not on file   Number of children: Not on file   Years of education: Not on file   Highest education level: Not on file  Occupational History   Not on file  Tobacco Use   Smoking status: Every Day    Types: E-cigarettes   Smokeless tobacco: Never   Tobacco comments:    last cigeratte use 6 days ago (11/30/15)  Vaping Use   Vaping status: Every Day  Substance and Sexual Activity   Alcohol use: Yes    Comment: occ   Drug use: No   Sexual activity: Never  Other Topics Concern   Not on file  Social History Narrative   Not on file   Social Determinants of Health   Financial Resource Strain: Not on file  Food Insecurity: Not on file  Transportation Needs: Not on file  Physical Activity: Not on file  Stress: Not on file  Social Connections: Not on file   Family History  Problem  Relation Age of Onset   Diabetes Maternal Grandmother    Lung cancer Father    Lung cancer Mother    Hypertension Mother    Heart disease Mother    Skin cancer Mother    Breast cancer Neg Hx    Allergies  Allergen Reactions   Penicillins Swelling and Rash    Has patient had a PCN reaction causing immediate rash, facial/tongue/throat swelling, SOB or lightheadedness with hypotension: Yes Has patient had a PCN reaction causing severe rash involving mucus membranes or skin necrosis: No Has patient had a PCN reaction that required hospitalization: No Has patient had a PCN reaction occurring within the last 10 years: No If all of the above answers are "NO", then  may proceed with Cephalosporin use.    Prior to Admission medications   Medication Sig Start Date End Date Taking? Authorizing Provider  albuterol (PROVENTIL) (2.5 MG/3ML) 0.083% nebulizer solution Take 2.5 mg by nebulization every 6 (six) hours as needed for wheezing.  09/16/14  Yes [provider]  albuterol (VENTOLIN HFA) 108 (90 Base) MCG/ACT inhaler Inhale 2 puffs into the lungs every 6 (six) hours as needed for wheezing or shortness of breath. 07/06/22  Yes Gherghe, Daylene Katayama, MD  DULoxetine (CYMBALTA) 30 MG capsule Take 1 capsule (30 mg total) by mouth 2 (two) times daily. 09/19/17  Yes Earna Coder, MD  gabapentin (NEURONTIN) 300 MG capsule Take 1 capsule (300 mg total) by mouth 3 (three) times daily. 10/25/20  Yes Earna Coder, MD  lamoTRIgine (LAMICTAL) 100 MG tablet Take 100 mg by mouth daily. 11/07/21  Yes [provider]  loperamide (IMODIUM) 2 MG capsule Take 1 capsule (2 mg total) by mouth as needed for diarrhea or loose stools. 07/06/22  Yes Leatha Gilding, MD  metoprolol succinate (TOPROL-XL) 100 MG 24 hr tablet Take 100 mg by mouth daily. Take with or immediately following a meal.   Yes [provider]  Multiple Vitamin (MULTI-VITAMINS) TABS Take 1 tablet by mouth daily.    Yes [provider]  omeprazole (PRILOSEC) 20 MG capsule Take 40 mg by mouth every morning. 02/14/22  Yes [provider]  QUEtiapine (SEROQUEL) 25 MG tablet Take 25 mg by mouth at bedtime as needed (sleep). 08/24/21  Yes [provider]  TRELEGY ELLIPTA 100-62.5-25 MCG/INH AEPB Inhale 1 puff into the lungs daily. 11/06/18  Yes [provider]  nystatin (MYCOSTATIN) 100000 UNIT/ML suspension Take 5 mLs (500,000 Units total) by mouth 4 (four) times daily. Patient not taking: Reported on 12/24/2022 07/23/22   Lynn Ito, MD  OXYGEN Inhale 2 L into the lungs at bedtime and may repeat dose one time if needed.     [provider]  SENEXON-S 8.6-50 MG tablet Take 2 tablets by mouth 2 (two) times daily. Patient not taking: Reported on 12/24/2022 12/18/21   [provider]   CT Angio Chest Pulmonary Embolism (PE) W or WO Contrast  Result Date: 04/25/2023 CLINICAL DATA:  Recent fall with left-sided chest pain, initial encounter EXAM: CT ANGIOGRAPHY CHEST WITH CONTRAST TECHNIQUE: Multidetector CT imaging of the chest was performed using the standard protocol during bolus administration of intravenous contrast. Multiplanar CT image reconstructions and MIPs were obtained to evaluate the vascular anatomy. RADIATION DOSE REDUCTION: This exam was performed according to the departmental dose-optimization program which includes automated exposure control, adjustment of the mA and/or kV according to patient size and/or use of iterative reconstruction technique. CONTRAST:  75mL OMNIPAQUE IOHEXOL  350 MG/ML SOLN COMPARISON:  Chest x-ray from earlier the same day, 10/02/2022. FINDINGS: Cardiovascular: Atherosclerotic calcifications of the thoracic aorta are noted. No aneurysmal dilatation is seen. No cardiac enlargement is noted. The pulmonary artery is well visualized within normal branching pattern bilaterally. No filling defect to suggest pulmonary embolism is noted. Mediastinum/Nodes: Thoracic inlet is within normal limits. The esophagus as visualized is unremarkable. No hilar or mediastinal adenopathy is noted. Scattered calcifications are noted in the hilar nodes likely related to prior granulomatous disease. Lungs/Pleura: Emphysematous changes are seen similar to that noted on the prior exam. Cavitary lesions are seen in the left upper lobe and lingula stable from the prior exam. No parenchymal nodule is seen. No effusion or focal infiltrate is seen. Upper Abdomen: No acute abnormality. Musculoskeletal: Degenerative changes of the thoracic spine are noted. No acute rib abnormality is noted. Scoliosis concave to the left is  noted in the thoracic spine. Review of the MIP images confirms the above findings. IMPRESSION: No evidence of pulmonary emboli. Chronic emphysematous changes with cavitary lesion stable from the prior exam. No acute rib abnormality is noted. Aortic Atherosclerosis (ICD10-I70.0) and Emphysema (ICD10-J43.9). Electronically Signed   By: Alcide Clever M.D.   On: 04/25/2023 23:32   DG Knee Complete 4 Views Left  Result Date: 04/25/2023 CLINICAL DATA:  Fall on left side, left hip pain. EXAM: LEFT KNEE - COMPLETE 4+ VIEW COMPARISON:  None Available. FINDINGS: No evidence of fracture, dislocation, or joint effusion. The alignment and joint spaces are preserved. There is chondrocalcinosis. No evidence of arthropathy or other focal bone abnormality. Soft tissues are unremarkable. IMPRESSION: 1. No fracture or dislocation of the left knee. 2. Chondrocalcinosis. Electronically Signed   By: Narda Rutherford M.D.   On: 04/25/2023 18:54   DG Chest Port 1 View  Result Date: 04/25/2023 CLINICAL DATA:  Shortness of breath.  Fall on left side. EXAM: PORTABLE CHEST 1 VIEW COMPARISON:  Radiograph 07/15/2022, CT 10/02/2022 FINDINGS: Emphysema with chronic left perihilar scarring. Previous cavitary lesion in the left lung on prior CT is less well-defined on the current exam. Chronic subpleural reticular markings at the right lung base. There is no acute airspace disease, pleural effusion, or pneumothorax. The heart is normal in size. Prominent thoracic scoliosis. IMPRESSION: 1. No acute abnormality. 2. Chronic lung disease. Emphysema with chronic left perihilar scarring. Electronically Signed   By: Narda Rutherford M.D.   On: 04/25/2023 18:54   DG Hip Unilat W or Wo Pelvis 2-3 Views Left  Result Date: 04/25/2023 CLINICAL DATA:  Fall with left hip pain. EXAM: DG HIP (WITH OR WITHOUT PELVIS) 2-3V LEFT COMPARISON:  None Available. FINDINGS: Displaced intertrochanteric left proximal femur fracture. The femoral head is well  seated, no hip dislocation. Intact bony pelvis, including pubic rami. No pubic symphyseal or sacroiliac diastasis. IMPRESSION: Displaced intertrochanteric left proximal femur fracture. Electronically Signed   By: Narda Rutherford M.D.   On: 04/25/2023 18:51    Positive ROS: All other systems have been reviewed and were otherwise negative with the exception of those mentioned in the HPI and as above.  Physical Exam: General: Alert, no acute distress Cardiovascular: No pedal edema Respiratory: No cyanosis, no use of accessory musculature GI: No organomegaly, abdomen is soft and non-tender Skin: No lesions in the area of chief complaint Neurologic: Sensation intact distally Psychiatric: Patient is competent for consent with normal mood and affect Lymphatic: No axillary or cervical lymphadenopathy  MUSCULOSKELETAL: Left leg is shortened and externally rotated.  There  is minimal swelling.  There is pain with range of motion of the hip.  Neurovascular status good distally.  The skin is intact.  The right lower extremity is normal.  Both upper extremities are normal to exam.  Spine is nontender.  Assessment: Displaced intertrochanteric fracture left hip  Plan: ORIF left hip fracture with trochanteric fixation nail    Valinda Hoar, MD [phone]   04/26/2023 11:04 AM Your husband here

## 2023-04-26 NOTE — Progress Notes (Signed)
Progress Note   Patient: Regina Bernard:[id number] DOB: 08/26/1955 DOA: 04/25/2023     1 DOS: the patient was seen and examined on 04/26/2023   Brief hospital course: Regina Bernard is a 67 y.o. female with medical history significant of COPD on 2L O2, HTN, GI bleeding, PUD, depression with anxiety, polysubstance abuse, chronic pain, liver cirrhosis, breast cancer, who presents with shortness of breath, chest pain, fall and left hip pain.  INTERTROCHANTERIC left hip nail performed on 10/19.   Principal Problem:   Closed left hip fracture [facility]) Active Problems:   Fall at home, initial encounter   COPD with acute exacerbation (HCC)   Liver cirrhosis (HCC)   Chest pain   Hyperkalemia   AKI (acute kidney injury) (HCC)   PUD (peptic ulcer disease)   Essential hypertension   Depression with anxiety   Assessment and Plan:  Closed left hip fracture (HCC): Fall at home, initial encounter   X-ray of left hip/pelvis that showed displaced intertrochanteric left proximal femur fracture.  Hip surgery performed 10/19.  Start PT/OT.     COPD with acute exacerbation [facility]): Patient has worsening shortness of breath, wheezing, due to COPD exacerbation.  Patient has increased oxygen requirement from 2 L to 3 L.  CTA negative for PE Continue bronchodilator and steroids.   History of liver cirrhosis (HCC): Mental status normal. Ammonia not elevated.   Chest pain: Patient has pleuritic chest pain.  Troponin negative x 2.  CTA negative for PE.    Hyperkalemia: Potassium 5.5 Hyponatremia. Mild metabolic acidosis. AKI (acute kidney injury) (HCC): Received IV fluids, potassium dropped down.  Renal function better. Sodium level dropped down to 129 from 132, started fluid restriction.   PUD (peptic ulcer disease) -Protonix   Essential hypertension Continue metoprolol.   Depression with anxiety -Seroquel, Lamictal, Cymbalta        Subjective:  Seen patient postop, still  sleepy.  Good saturation.  Physical Exam: Vitals:   04/26/23 1256 04/26/23 1300 04/26/23 1315 04/26/23 1330  BP: (!) 152/85 (!) 160/91 (!) 166/102 (!) 173/118  Pulse: 74     Resp: 11 16    Temp: (!) 97.2 F (36.2 C)     TempSrc: Temporal     SpO2: 100% 100% 99% 100%  Weight:       General exam: Appears calm and comfortable  Respiratory system: Decreased breath sounds. Respiratory effort normal. Cardiovascular system: S1 & S2 heard, RRR. No JVD, murmurs, rubs, gallops or clicks. No pedal edema. Gastrointestinal system: Abdomen is nondistended, soft and nontender. No organomegaly or masses felt. Normal bowel sounds heard. Central nervous system: Drowsy. Extremities: Symmetric 5 x 5 power. Skin: No rashes, lesions or ulcers    Data Reviewed:  X-ray and lab results reviewed.  Family Communication: None  Disposition: Status is: Inpatient Remains inpatient appropriate because: Operative disease, IV treatment, postop day #1.     Time spent: 35 minutes  Author: Marrion Coy, MD 04/26/2023 1:54 PM  For on call review [email or web address].

## 2023-04-26 NOTE — ED Notes (Signed)
Discussed obtaining consent for surgery with patient. Patient refusing to sign at this time stating "I haven't talked to anyone about the surgery". Wishes to wait until speaking with surgery before signing consent.

## 2023-04-26 NOTE — Anesthesia Postprocedure Evaluation (Signed)
Anesthesia Post Note  Patient: Regina Bernard  Procedure(s) Performed: INTRAMEDULLARY (IM) NAIL INTERTROCHANTERIC (Left: Hip)  Patient location during evaluation: PACU Anesthesia Type: Spinal Level of consciousness: awake and alert Pain management: pain level controlled Vital Signs Assessment: post-procedure vital signs reviewed and stable Respiratory status: spontaneous breathing, nonlabored ventilation, respiratory function stable and patient connected to nasal cannula oxygen Cardiovascular status: blood pressure returned to baseline and stable Postop Assessment: no apparent nausea or vomiting Anesthetic complications: no  No notable events documented.   Last Vitals:  Vitals:   04/26/23 1415 04/26/23 1500  BP: 135/89 111/74  Pulse: 70 71  Resp: 17 18  Temp:    SpO2: 96% 96%    Last Pain:  Vitals:   04/26/23 1400  TempSrc:   PainSc: 8                  Stephanie Coup

## 2023-04-26 NOTE — Anesthesia Procedure Notes (Signed)
Date/Time: 04/26/2023 11:19 AM  Performed by: Stormy Fabian, CRNAPre-anesthesia Checklist: Patient identified, Emergency Drugs available, Suction available and Patient being monitored Patient Re-evaluated:Patient Re-evaluated prior to induction Oxygen Delivery Method: Simple face mask Induction Type: IV induction Dental Injury: Teeth and Oropharynx as per pre-operative assessment

## 2023-04-26 NOTE — Transfer of Care (Signed)
Immediate Anesthesia Transfer of Care Note  Patient: Regina Bernard  Procedure(s) Performed: INTRAMEDULLARY (IM) NAIL INTERTROCHANTERIC (Left: Hip)  Patient Location: PACU  Anesthesia Type:General  Level of Consciousness: drowsy  Airway & Oxygen Therapy: Patient Spontanous Breathing and Patient connected to face mask oxygen  Post-op Assessment: Report given to RN and Post -op Vital signs reviewed and stable  Post vital signs: Reviewed and stable  Last Vitals:  Vitals Value Taken Time  BP 152/85 04/26/23 1256  Temp 36.2 C 04/26/23 1256  Pulse 74 04/26/23 1256  Resp 17 04/26/23 1259  SpO2 100 % 04/26/23 1256  Vitals shown include unfiled device data.  Last Pain:  Vitals:   04/26/23 1256  TempSrc: Temporal  PainSc:       Patients Stated Pain Goal: 2 (04/26/23 0927)  Complications: No notable events documented.

## 2023-04-26 NOTE — ED Notes (Signed)
Pt educated on NPO status at midnight

## 2023-04-27 DIAGNOSIS — E871 Hypo-osmolality and hyponatremia: Secondary | ICD-10-CM | POA: Diagnosis not present

## 2023-04-27 DIAGNOSIS — J441 Chronic obstructive pulmonary disease with (acute) exacerbation: Secondary | ICD-10-CM | POA: Diagnosis not present

## 2023-04-27 DIAGNOSIS — S72002A Fracture of unspecified part of neck of left femur, initial encounter for closed fracture: Secondary | ICD-10-CM | POA: Diagnosis not present

## 2023-04-27 LAB — URINALYSIS, W/ REFLEX TO CULTURE (INFECTION SUSPECTED)
Bilirubin Urine: NEGATIVE
Glucose, UA: NEGATIVE mg/dL
Hgb urine dipstick: NEGATIVE
Ketones, ur: NEGATIVE mg/dL
Nitrite: NEGATIVE
Protein, ur: NEGATIVE mg/dL
Specific Gravity, Urine: 1.029 (ref 1.005–1.030)
pH: 5 (ref 5.0–8.0)

## 2023-04-27 LAB — BASIC METABOLIC PANEL
Anion gap: 12 (ref 5–15)
BUN: 38 mg/dL — ABNORMAL HIGH (ref 8–23)
CO2: 24 mmol/L (ref 22–32)
Calcium: 8.7 mg/dL — ABNORMAL LOW (ref 8.9–10.3)
Chloride: 89 mmol/L — ABNORMAL LOW (ref 98–111)
Creatinine, Ser: 1.78 mg/dL — ABNORMAL HIGH (ref 0.44–1.00)
GFR, Estimated: 31 mL/min — ABNORMAL LOW (ref >60)
Glucose, Bld: 197 mg/dL — ABNORMAL HIGH (ref 70–99)
Potassium: 4.5 mmol/L (ref 3.5–5.1)
Sodium: 125 mmol/L — ABNORMAL LOW (ref 135–145)

## 2023-04-27 LAB — CBC
HCT: 28.3 % — ABNORMAL LOW (ref 36.0–46.0)
Hemoglobin: 9.1 g/dL — ABNORMAL LOW (ref 12.0–15.0)
MCH: 33.5 pg (ref 26.0–34.0)
MCHC: 32.2 g/dL (ref 30.0–36.0)
MCV: 104 fL — ABNORMAL HIGH (ref 80.0–100.0)
Platelets: 237 10*3/uL (ref 150–400)
RBC: 2.72 MIL/uL — ABNORMAL LOW (ref 3.87–5.11)
RDW: 13.1 % (ref 11.5–15.5)
WBC: 18.7 10*3/uL — ABNORMAL HIGH (ref 4.0–10.5)
nRBC: 0 % (ref 0.0–0.2)

## 2023-04-27 LAB — EXPECTORATED SPUTUM ASSESSMENT W GRAM STAIN, RFLX TO RESP C

## 2023-04-27 LAB — SODIUM: Sodium: 128 mmol/L — ABNORMAL LOW (ref 135–145)

## 2023-04-27 LAB — MAGNESIUM: Magnesium: 1.9 mg/dL (ref 1.7–2.4)

## 2023-04-27 MED ORDER — SODIUM CHLORIDE 0.9 % IV SOLN: INTRAVENOUS | Status: DC

## 2023-04-27 MED ORDER — IPRATROPIUM-ALBUTEROL 0.5-2.5 (3) MG/3ML IN SOLN
3.0000 mL | Freq: Four times a day (QID) | RESPIRATORY_TRACT | Status: DC
  Administered 2023-04-27 – 2023-04-30 (×13): 3 mL via RESPIRATORY_TRACT
  Filled 2023-04-27 (×14): qty 3

## 2023-04-27 MED ORDER — PREDNISONE 20 MG PO TABS
40.0000 mg | ORAL_TABLET | Freq: Every day | ORAL | Status: DC
  Administered 2023-04-28 – 2023-04-29 (×2): 40 mg via ORAL
  Filled 2023-04-27 (×2): qty 2

## 2023-04-27 NOTE — Evaluation (Signed)
Occupational Therapy Evaluation Patient Details Name: Regina Bernard MRN: [id number] DOB: Jul 05, 1956 Today's Date: 04/27/2023   History of Present Illness Pt is a 67 year old female presenting with presents with shortness of breath, chest pain, fall and left hip pain; L IMN performed on 10/19    PMH significant for COPD on 2L O2, HTN, GI bleeding, PUD, depression with anxiety, polysubstance abuse, chronic pain, liver cirrhosis, breast cancer   Clinical Impression   Chart reviewed, pt greeted in bed, agreeable to OT evaluation. Pt is alert and oriented x4, appears anxious with mobility. PTA pt reports she was generally MOD I with ADL, assist for IADL, PRN use of rollator household distance. Pt presents with deficits in strength, endurance, activity tolerance, balance affecting safe and optimal ADL completion. MOD A required for bed mobility, MOD A +2 for STS with RW, appropriate adherence to PWBing in static stand however pt able to tolerate approx 10 seconds of standing before requiring a seated rest break due to reported SOB. Spo2 >90% on 3L after returning to bed.  MAX A required for LB dressing, SET UP for seated feeding/grooming tasks. Pt educated on importance of progressing mobility, participation in ADL tasks such as transferring to bedside commode. Pt is left as received, nurse in room, all needs met. Pt will benefit from ongoing OT to address deficits and to facilitate return to PLOF. OT will follow acutely.       If plan is discharge home, recommend the following: A lot of help with walking and/or transfers;A lot of help with bathing/dressing/bathroom;Assist for transportation;Help with stairs or ramp for entrance;Assistance with cooking/housework    Functional Status Assessment  Patient has had a recent decline in their functional status and demonstrates the ability to make significant improvements in function in a reasonable and predictable amount of time.  Equipment  Recommendations  Other (comment) (per next venue of care)    Recommendations for Other Services       Precautions / Restrictions Precautions Precautions: Fall Restrictions Weight Bearing Restrictions: Yes LLE Weight Bearing: Partial weight bearing LLE Partial Weight Bearing Percentage or Pounds: 50%      Mobility Bed Mobility Overal bed mobility: Needs Assistance Bed Mobility: Supine to Sit, Sit to Supine     Supine to sit: Mod assist, HOB elevated Sit to supine: Mod assist, HOB elevated        Transfers Overall transfer level: Needs assistance Equipment used: Rolling walker (2 wheels) Transfers: Sit to/from Stand Sit to Stand: Mod assist, +2 physical assistance           General transfer comment: approrpiate PWBing, however tolerated standing for approx 10 seconds before requiring seated rest break      Balance Overall balance assessment: Needs assistance Sitting-balance support: Feet supported, Bilateral upper extremity supported Sitting balance-Leahy Scale: Good     Standing balance support: Bilateral upper extremity supported, During functional activity, Reliant on assistive device for balance Standing balance-Leahy Scale: Poor                             ADL either performed or assessed with clinical judgement   ADL Overall ADL's : Needs assistance/impaired Eating/Feeding: Set up;Sitting   Grooming: Wash/dry face;Sitting;Set up               Lower Body Dressing: Maximal assistance;Bed level Lower Body Dressing Details (indicate cue type and reason): socks     Toileting- Clothing Manipulation and Hygiene:  Maximal assistance               Vision Patient Visual Report: No change from baseline       Perception         Praxis         Pertinent Vitals/Pain Pain Assessment Pain Assessment: 0-10 Pain Score: 7  Pain Location: L hip Pain Descriptors / Indicators: Aching Pain Intervention(s): Limited activity within  patient's tolerance, Monitored during session, Repositioned, Patient requesting pain meds-RN notified     Extremity/Trunk Assessment Upper Extremity Assessment Upper Extremity Assessment: Generalized weakness   Lower Extremity Assessment Lower Extremity Assessment: LLE deficits/detail LLE Deficits / Details: s/p L IMN LLE: Unable to fully assess due to pain LLE Sensation: WNL       Communication Communication Communication: No apparent difficulties Cueing Techniques: Verbal cues;Visual cues   Cognition Arousal: Alert Behavior During Therapy: WFL for tasks assessed/performed, anxious regarding mobility  Overall Cognitive Status: Within Functional Limits for tasks assessed                                       General Comments  pt reports SOB with attempted mobility, spo2 continously monitored on 3 L via [place], ?pleth when standing in the 80s, but returned to >90% in supine; BP 106/77 (MAP 83), HR in the 80s seated on edge of bed    Exercises Other Exercises Other Exercises: edu re: role of OT, role of rehab, safe ADL completion, importance of progressing mobility   Shoulder Instructions      Home Living Family/patient expects to be discharged to:: Private residence Living Arrangements: Spouse/significant other Available Help at Discharge: Family;Available PRN/intermittently Type of Home: House Home Access: Stairs to enter [employer] of Steps: 3 Entrance Stairs-Rails: Right Home Layout: One level     Bathroom Shower/Tub: Chief Strategy Officer: Standard Bathroom Accessibility: No   Home Equipment: Agricultural consultant (2 wheels);Rollator (4 wheels);Shower seat;Grab bars - toilet;Grab bars - tub/shower   Additional Comments: 2L O2 at home;      Prior Functioning/Environment Prior Level of Function : Independent/Modified Independent             Mobility Comments: amb PRN use of rollator ADLs Comments: MOD I in ADL with energy  conservation techniques, assist for cooking, cleaning, IADLs from husband (who works long hrs per pt report)        OT Problem List: Decreased strength;Decreased activity tolerance;Decreased knowledge of use of DME or AE;Decreased safety awareness;Impaired balance (sitting and/or standing)      OT Treatment/Interventions: Self-care/ADL training;DME and/or AE instruction;Therapeutic activities;Balance training;Therapeutic exercise;Patient/family education    OT Goals(Current goals can be found in the care plan section) Acute Rehab OT Goals Patient Stated Goal: return to PLOF OT Goal Formulation: With patient Time For Goal Achievement: 05/11/23 Potential to Achieve Goals: Good ADL Goals Pt Will Perform Grooming: with modified independence;standing Pt Will Perform Lower Body Dressing: with modified independence;sit to/from stand Pt Will Transfer to Toilet: with modified independence;ambulating Pt Will Perform Toileting - Clothing Manipulation and hygiene: with modified independence;sit to/from stand  OT Frequency: Min 1X/week    Co-evaluation              AM-PAC OT "6 Clicks" Daily Activity     Outcome Measure Help from another person eating meals?: None Help from another person taking care of personal grooming?: None Help from another person  toileting, which includes using toliet, bedpan, or urinal?: A Lot Help from another person bathing (including washing, rinsing, drying)?: A Lot Help from another person to put on and taking off regular upper body clothing?: A Little Help from another person to put on and taking off regular lower body clothing?: A Lot 6 Click Score: 17   End of Session Equipment Utilized During Treatment: Gait belt;Rolling walker (2 wheels) Nurse Communication: Mobility status  Activity Tolerance: Patient limited by pain (nurse in room during session, providing pain meds) Patient left: in bed;with call bell/phone within reach;with bed alarm set;with  nursing/sitter in room  OT Visit Diagnosis: Other abnormalities of gait and mobility (R26.89);Muscle weakness (generalized) (M62.81)                Time: 1914-7829 OT Time Calculation (min): 27 min Charges:  OT General Charges $OT Visit: 1 Visit OT Evaluation $OT Eval Moderate Complexity: 1 Mod  Oleta Mouse, OTD OTR/L  04/27/23, 12:49 PM

## 2023-04-27 NOTE — Plan of Care (Signed)

## 2023-04-27 NOTE — Progress Notes (Signed)
Physical Therapy Evaluation Patient Details Name: Regina Bernard MRN: [id number] DOB: [birth year] Today's Date: 04/27/2023  History of Present Illness  Pt is a 67 year old female presenting with presents with shortness of breath, chest pain, fall and left hip pain; L IMN performed on 10/19    PMH significant for COPD on 2L O2, HTN, GI bleeding, PUD, depression with anxiety, polysubstance abuse, chronic pain, liver cirrhosis, breast cancer   Clinical Impression  Patient supine in bed upon arrival, agreeable to PT evaluation this date. PTA, patient reports ambulated with Rollator intermittent, does ambulate some without device but has history of falls. Patient is on 2L supplemental oxygen at baseline. Limited assessment of LLE strength due to pain. Patient require Mod A with bed mobility, assist for LLE and to come seated EOB. Increased time/rest breaks required due to pain levels despite being pre-medicated. Pt require Mod to Max A to stand from EOB with use of RW, able to maintain WBing precautions, with limited weight placed on LLE. Pt refuse transfer to recliner despite max encouragement. Patient limited during session overall due to pain/fear of mobility. Patient will benefit from acute skilled PT services to address impairments (see below for additional details) and to maximize function mobility to return to PLOF. Patient left with all needs in reach, bed alarm set, and SCDs reapplied. Anticipate PT follow up after acute discharge. Will follow acutely.       If plan is discharge home, recommend the following: A lot of help with walking and/or transfers;A lot of help with bathing/dressing/bathroom;Help with stairs or ramp for entrance;Assist for transportation   Can travel by private vehicle   No    Equipment Recommendations None recommended by PT (TBD at next level of venue)  Recommendations for Other Services       Functional Status Assessment Patient has had a recent decline in their  functional status and demonstrates the ability to make significant improvements in function in a reasonable and predictable amount of time.     Precautions / Restrictions Precautions Precautions: Fall Restrictions Weight Bearing Restrictions: Yes LLE Weight Bearing: Partial weight bearing LLE Partial Weight Bearing Percentage or Pounds: 50%      Mobility  Bed Mobility Overal bed mobility: Needs Assistance Bed Mobility: Supine to Sit, Sit to Supine     Supine to sit: Mod assist Sit to supine: Mod assist   General bed mobility comments: Pt able to facilitate supine <> sit with use of bed rail, but require Mod A and freq rest breaks due to pain in L Hip. Mod A with use of chuck pad to assist to scooting to EOB; patient with reports of SOB but Sp02 high 90's with mobility    Transfers Overall transfer level: Needs assistance Equipment used: Rolling walker (2 wheels) Transfers: Sit to/from Stand Sit to Stand: Mod assist, Max assist           General transfer comment: completed sit > stand from EOB with Mod to Max A ranging on fear/pain. Completed with RW, cues for hand placement. PT able to maintain 50% PWB precautions on LLE with transfer. PT providing max encouragement for transfer from bed > recliner, patient refusing to trial this date despite encouragement due to pain, requested to return supine.    Ambulation/Gait               General Gait Details: unable to assess due to pain  Stairs  Wheelchair Mobility     Tilt Bed    Modified Rankin (Stroke Patients Only)       Balance Overall balance assessment: Needs assistance Sitting-balance support: Feet supported, Bilateral upper extremity supported Sitting balance-Leahy Scale: Good     Standing balance support: Bilateral upper extremity supported, During functional activity, Reliant on assistive device for balance Standing balance-Leahy Scale: Fair Standing balance comment: increased  reliance on RW with standing; poor balance, decreased weight shift onto RLE                             Pertinent Vitals/Pain Pain Assessment Pain Assessment: 0-10 Pain Score: 7  Pain Location: L hip Pain Descriptors / Indicators: Discomfort Pain Intervention(s): Limited activity within patient's tolerance, Monitored during session, Repositioned, Premedicated before session    Home Living Family/patient expects to be discharged to:: Private residence Living Arrangements: Spouse/significant other Available Help at Discharge: Family;Available PRN/intermittently Type of Home: House Home Access: Stairs to enter Entrance Stairs-Rails: Right Entrance Stairs-Number of Steps: 3   Home Layout: One level Home Equipment: Agricultural consultant (2 wheels);Rollator (4 wheels);Shower seat;Grab bars - toilet;Grab bars - tub/shower Additional Comments: 2L O2 at home;    Prior Function Prior Level of Function : Independent/Modified Independent             Mobility Comments: amb PRN use of rollator ADLs Comments: MOD I in ADL with energy conservation techniques, assist for cooking, cleaning, IADLs from husband (who works long hrs per pt report)     Extremity/Trunk Assessment   Upper Extremity Assessment Upper Extremity Assessment: Generalized weakness    Lower Extremity Assessment Lower Extremity Assessment: LLE deficits/detail LLE Deficits / Details: s/p L IMN LLE: Unable to fully assess due to pain LLE Sensation: WNL       Communication   Communication Communication: No apparent difficulties Cueing Techniques: Verbal cues;Visual cues  Cognition Arousal: Alert Behavior During Therapy: WFL for tasks assessed/performed Overall Cognitive Status: Within Functional Limits for tasks assessed                                          General Comments General comments (skin integrity, edema, etc.): pt reports SOB with attempted mobility, spo2 continously  monitored on 3 L via [place], ?pleth when standing in the 80s, but returned to >90% in supine    Exercises Other Exercises Other Exercises: Educated on PT role; importance of OOB mobility   Assessment/Plan    PT Assessment Patient needs continued PT services  PT Problem List Decreased strength;Decreased range of motion;Decreased activity tolerance;Decreased balance;Decreased mobility;Pain       PT Treatment Interventions DME instruction;Gait training;Stair training;Functional mobility training;Therapeutic activities;Therapeutic exercise;Balance training    PT Goals (Current goals can be found in the Care Plan section)  Acute Rehab PT Goals Patient Stated Goal: get rid of this pain PT Goal Formulation: With patient Time For Goal Achievement: 05/11/23 Potential to Achieve Goals: Good    Frequency 7X/week     Co-evaluation               AM-PAC PT "6 Clicks" Mobility  Outcome Measure Help needed turning from your back to your side while in a flat bed without using bedrails?: A Little Help needed moving from lying on your back to sitting on the side of a flat bed without using bedrails?: A  Little Help needed moving to and from a bed to a chair (including a wheelchair)?: A Lot Help needed standing up from a chair using your arms (e.g., wheelchair or bedside chair)?: A Lot Help needed to walk in hospital room?: A Lot Help needed climbing 3-5 steps with a railing? : A Lot 6 Click Score: 14    End of Session Equipment Utilized During Treatment: Gait belt Activity Tolerance: Patient limited by pain Patient left: in bed;with bed alarm set;with call bell/phone within reach;with SCD's reapplied Nurse Communication: Mobility status PT Visit Diagnosis: Other abnormalities of gait and mobility (R26.89);Difficulty in walking, not elsewhere classified (R26.2);History of falling (Z91.81);Muscle weakness (generalized) (M62.81);Unsteadiness on feet (R26.81);Pain Pain - Right/Left: Left Pain  - part of body: Hip    Time: 1005-1031 PT Time Calculation (min) (ACUTE ONLY): 26 min   Charges:   PT Evaluation $PT Eval Moderate Complexity: 1 Mod   PT General Charges $$ ACUTE PT VISIT: 1 Visit         Howie Ill, PT, DPT 04/27/23 12:36 PM

## 2023-04-27 NOTE — Progress Notes (Addendum)
Progress Note   Patient: Regina Bernard MWU:[id number] DOB: 06/18/1956 DOA: 04/25/2023     2 DOS: the patient was seen and examined on 04/27/2023   Brief hospital course: Regina Bernard is a 67 y.o. female with medical history significant of COPD on 2L O2, HTN, GI bleeding, PUD, depression with anxiety, polysubstance abuse, chronic pain, liver cirrhosis, breast cancer, who presents with shortness of breath, chest pain, fall and left hip pain.  INTERTROCHANTERIC left hip nail performed on 10/19.   Principal Problem:   Closed left hip fracture [facility]) Active Problems:   Fall at home, initial encounter   COPD with acute exacerbation (HCC)   Liver cirrhosis (HCC)   Chest pain   Hyperkalemia   AKI (acute kidney injury) (HCC)   PUD (peptic ulcer disease)   Essential hypertension   Depression with anxiety   Assessment and Plan:  Closed left hip fracture (HCC): Fall at home, initial encounter   X-ray of left hip/pelvis that showed displaced intertrochanteric left proximal femur fracture.  Hip surgery performed 10/19.  Patient doing well after surgery, was able to stand up with physical therapy.     COPD with acute exacerbation (HCC) Tobacco abuse : Patient has worsening shortness of breath, wheezing, due to COPD exacerbation.  Patient has increased oxygen requirement from 2 L to 3 L.  CTA negative for PE Respiratory status improving, change steroids to prednisone 40 mg daily. Patient currently still smokes 2 packs of cigarette a day while on oxygen. Had a long discussion with patient and husband, strongly encouraged her to quit   History of liver cirrhosis [employer]): Mental status normal. Ammonia not elevated.   Chest pain: Patient has pleuritic chest pain.  Troponin negative x 2.  CTA negative for PE.     Hyperkalemia: Potassium 5.5 Hyponatremia. Mild metabolic acidosis. AKI (acute kidney injury) (HCC): Sodium level further dropped down to 125 today after giving half-normal  saline postop.  Discontinued half-normal saline, started on normal saline for postop until this afternoon.  Continue sodium 1 g 3 times a day, restarted for restriction. Renal function had improved.   PUD (peptic ulcer disease) -Protonix   Essential hypertension Continue metoprolol.   Depression with anxiety -Seroquel, Lamictal, Cymbalta       Subjective:  Patient feels better postop.  Still has some pain, otherwise doing well.  Physical Exam: Vitals:   04/27/23 0444 04/27/23 0725 04/27/23 0845 04/27/23 1144  BP: 118/68  120/66   Pulse: 67  81   Resp: (!) 22  16   Temp: (!) 97.5 F (36.4 C)  97.8 F (36.6 C)   TempSrc:   Oral   SpO2: 100% 90% 99% 99%  Weight:       General exam: Appears calm and comfortable  Respiratory system: Decreased breath sounds. Respiratory effort normal. Cardiovascular system: S1 & S2 heard, RRR. No JVD, murmurs, rubs, gallops or clicks. No pedal edema. Gastrointestinal system: Abdomen is nondistended, soft and nontender. No organomegaly or masses felt. Normal bowel sounds heard. Central nervous system: Alert and oriented. No focal neurological deficits. Extremities: Symmetric 5 x 5 power. Skin: No rashes, lesions or ulcers Psychiatry: Judgement and insight appear normal. Mood & affect appropriate.    Data Reviewed:  Lab results reviewed.  Family Communication: husband updated  The second thing or second or second disposition: Status is: Inpatient Remains inpatient appropriate because: Severity of disease, postop and IV treatment.     Time spent: 35 minutes  Author: Marrion Coy, MD  04/27/2023 12:35 PM  For on call review [email or web address].

## 2023-04-27 NOTE — Progress Notes (Addendum)
Subjective: 1 Day Post-Op Procedure(s) (LRB): INTRAMEDULLARY (IM) NAIL INTERTROCHANTERIC (Left) Patient is alert and awake sitting up in her bed.  Breathing is mildly labored.  Her husband is at the bedside.  She has moderate pain. White blood count elevated to 18,700.  Some of this may be due to trauma.  She remains afebrile. Hemoglobin is 9.1.  Minimal change since yesterday. Serum sodium is down to 125 however.  Patient reports pain as moderate.  Objective:   VITALS:   Vitals:   04/27/23 0845 04/27/23 1144  BP: 120/66   Pulse: 81   Resp: 16   Temp: 97.8 F (36.6 C)   SpO2: 99% 99%    Neurologically intact Sensation intact distally Intact pulses distally Dorsiflexion/Plantar flexion intact Incision: dressing C/D/I  LABS Recent Labs    04/25/23 1719 04/26/23 0139 04/27/23 0533  HGB 11.1* 10.2* 9.1*  HCT 34.2* 31.3* 28.3*  WBC 9.4 6.5 18.7*  PLT 213 179 237    Recent Labs    04/25/23 1821 04/26/23 0139 04/27/23 0533  NA 132* 129* 125*  K 5.5* 3.5 4.5  BUN 28* 26* 38*  CREATININE 1.41* 1.26* 1.78*  GLUCOSE 102* 147* 197*    Recent Labs    04/25/23 1719  INR 1.0     Assessment/Plan: 1 Day Post-Op Procedure(s) (LRB): INTRAMEDULLARY (IM) NAIL INTERTROCHANTERIC (Left)   Advance diet Up with therapy D/C IV fluids Discharge to SNF

## 2023-04-28 ENCOUNTER — Encounter: Payer: Self-pay | Admitting: Specialist

## 2023-04-28 DIAGNOSIS — J441 Chronic obstructive pulmonary disease with (acute) exacerbation: Secondary | ICD-10-CM | POA: Diagnosis not present

## 2023-04-28 DIAGNOSIS — D62 Acute posthemorrhagic anemia: Secondary | ICD-10-CM

## 2023-04-28 DIAGNOSIS — S72002A Fracture of unspecified part of neck of left femur, initial encounter for closed fracture: Secondary | ICD-10-CM | POA: Diagnosis not present

## 2023-04-28 LAB — CBC
HCT: 24.5 % — ABNORMAL LOW (ref 36.0–46.0)
Hemoglobin: 8.1 g/dL — ABNORMAL LOW (ref 12.0–15.0)
MCH: 33.5 pg (ref 26.0–34.0)
MCHC: 33.1 g/dL (ref 30.0–36.0)
MCV: 101.2 fL — ABNORMAL HIGH (ref 80.0–100.0)
Platelets: 214 10*3/uL (ref 150–400)
RBC: 2.42 MIL/uL — ABNORMAL LOW (ref 3.87–5.11)
RDW: 13.3 % (ref 11.5–15.5)
WBC: 12.9 10*3/uL — ABNORMAL HIGH (ref 4.0–10.5)
nRBC: 0 % (ref 0.0–0.2)

## 2023-04-28 LAB — BASIC METABOLIC PANEL
Anion gap: 6 (ref 5–15)
BUN: 52 mg/dL — ABNORMAL HIGH (ref 8–23)
CO2: 26 mmol/L (ref 22–32)
Calcium: 8.8 mg/dL — ABNORMAL LOW (ref 8.9–10.3)
Chloride: 97 mmol/L — ABNORMAL LOW (ref 98–111)
Creatinine, Ser: 1.6 mg/dL — ABNORMAL HIGH (ref 0.44–1.00)
GFR, Estimated: 35 mL/min — ABNORMAL LOW (ref >60)
Glucose, Bld: 92 mg/dL (ref 70–99)
Potassium: 4.9 mmol/L (ref 3.5–5.1)
Sodium: 129 mmol/L — ABNORMAL LOW (ref 135–145)

## 2023-04-28 LAB — IRON AND TIBC
Iron: 28 ug/dL (ref 28–170)
Saturation Ratios: 11 % (ref 10.4–31.8)
TIBC: 260 ug/dL (ref 250–450)
UIBC: 232 ug/dL

## 2023-04-28 LAB — MAGNESIUM: Magnesium: 2.2 mg/dL (ref 1.7–2.4)

## 2023-04-28 MED ORDER — SODIUM CHLORIDE 1 G PO TABS
1.0000 g | ORAL_TABLET | Freq: Two times a day (BID) | ORAL | Status: DC
  Administered 2023-04-28: 1 g via ORAL
  Filled 2023-04-28: qty 1

## 2023-04-28 NOTE — Progress Notes (Signed)
Physical Therapy Treatment Patient Details Name: Regina Bernard MRN: [id number] DOB: 27-May-1956 Today's Date: 04/28/2023   History of Present Illness Pt is a 67 year old female presenting with presents with shortness of breath, chest pain, fall and left hip pain; L IMN performed on 10/19    PMH significant for COPD on 2L O2, HTN, GI bleeding, PUD, depression with anxiety, polysubstance abuse, chronic pain, liver cirrhosis, breast cancer    PT Comments  Pt alert, agreeable to PT but did display some emotional lability, premedicated prior to session. Pt very fearful of pain throughout, and was limited by SOB (pt requested rescue inhaler once seated in recliner). Supine to sit with minA for management of LLE. Sit <> stand with RW and modA, and pt able to pivot on her RLE to recliner in room. Predominantly stayed touch down weight bearing or NWB on LLE as her preference, intermittently minA for steadying. Unable to progress mobility further due to SOB and pt pain. Pt in recliner with needs in reach. The patient would benefit from further skilled PT intervention to continue to progress towards goals.  Placed on 3L for mobility per pt baseline, and returned to 2L at end of session. Pt endorsed significant SOB, spO2 95% at rest, RN notified of pt status.       If plan is discharge home, recommend the following: A lot of help with walking and/or transfers;A lot of help with bathing/dressing/bathroom;Help with stairs or ramp for entrance;Assist for transportation   Can travel by private vehicle     No  Equipment Recommendations  Other (comment) (TBD)    Recommendations for Other Services       Precautions / Restrictions Precautions Precautions: Fall Restrictions Weight Bearing Restrictions: Yes LLE Weight Bearing: Partial weight bearing LLE Partial Weight Bearing Percentage or Pounds: 50     Mobility  Bed Mobility Overal bed mobility: Needs Assistance Bed Mobility: Supine to Sit, Sit  to Supine     Supine to sit: Mod assist, HOB elevated, Min assist     General bed mobility comments: minA throughout for LLE    Transfers Overall transfer level: Needs assistance Equipment used: Rolling walker (2 wheels) Transfers: Sit to/from Stand, Bed to chair/wheelchair/BSC Sit to Stand: Mod assist Stand pivot transfers: Min assist         General transfer comment: touchdown or NWB per pt preference, minA for intermittent steadying due to posterior lean    Ambulation/Gait               General Gait Details: unable to tolerate   Stairs             Wheelchair Mobility     Tilt Bed    Modified Rankin (Stroke Patients Only)       Balance Overall balance assessment: Needs assistance Sitting-balance support: Feet supported, Bilateral upper extremity supported Sitting balance-Leahy Scale: Good     Standing balance support: Bilateral upper extremity supported, During functional activity, Reliant on assistive device for balance Standing balance-Leahy Scale: Poor Standing balance comment: increased reliance on RW with standing; poor balance, decreased weight shift onto RLE                            Cognition Arousal: Alert Behavior During Therapy: WFL for tasks assessed/performed Overall Cognitive Status: Within Functional Limits for tasks assessed  Exercises      General Comments        Pertinent Vitals/Pain Pain Assessment Pain Assessment: 0-10 Pain Score: 6  Pain Location: L hip Pain Descriptors / Indicators: Aching Pain Intervention(s): Limited activity within patient's tolerance, Monitored during session, Repositioned, Premedicated before session    Home Living                          Prior Function            PT Goals (current goals can now be found in the care plan section) Progress towards PT goals: Progressing toward goals    Frequency     7X/week      PT Plan      Co-evaluation              AM-PAC PT "6 Clicks" Mobility   Outcome Measure  Help needed turning from your back to your side while in a flat bed without using bedrails?: A Little Help needed moving from lying on your back to sitting on the side of a flat bed without using bedrails?: A Little Help needed moving to and from a bed to a chair (including a wheelchair)?: A Lot Help needed standing up from a chair using your arms (e.g., wheelchair or bedside chair)?: A Lot Help needed to walk in hospital room?: Total Help needed climbing 3-5 steps with a railing? : Total 6 Click Score: 12    End of Session Equipment Utilized During Treatment: Gait belt Activity Tolerance: Patient limited by pain Patient left: in chair;with call bell/phone within reach;with chair alarm set Nurse Communication: Mobility status PT Visit Diagnosis: Other abnormalities of gait and mobility (R26.89);Difficulty in walking, not elsewhere classified (R26.2);History of falling (Z91.81);Muscle weakness (generalized) (M62.81);Unsteadiness on feet (R26.81);Pain Pain - Right/Left: Left Pain - part of body: Hip     Time: 1610-9604 PT Time Calculation (min) (ACUTE ONLY): 19 min  Charges:    $Therapeutic Activity: 8-22 mins PT General Charges $$ ACUTE PT VISIT: 1 Visit                     Olga Coaster PT, DPT 12:29 PM,04/28/23

## 2023-04-28 NOTE — Plan of Care (Signed)

## 2023-04-28 NOTE — Progress Notes (Signed)
Progress Note   Patient: Regina Bernard ACZ:[id number] DOB: 1956-06-18 DOA: 04/25/2023     3 DOS: the patient was seen and examined on 04/28/2023   Brief hospital course: CHARM BROTHER is a 67 y.o. female with medical history significant of COPD on 2L O2, HTN, GI bleeding, PUD, depression with anxiety, polysubstance abuse, chronic pain, liver cirrhosis, breast cancer, who presents with shortness of breath, chest pain, fall and left hip pain.  INTERTROCHANTERIC left hip nail performed on 10/19.   Principal Problem:   Closed left hip fracture [facility]) Active Problems:   Fall at home, initial encounter   COPD with acute exacerbation (HCC)   Liver cirrhosis (HCC)   Chest pain   Hyperkalemia   AKI (acute kidney injury) (HCC)   PUD (peptic ulcer disease)   Essential hypertension   Depression with anxiety   Assessment and Plan: Closed left hip fracture (HCC): Fall at home, initial encounter   X-ray of left hip/pelvis that showed displaced intertrochanteric left proximal femur fracture.  Hip surgery performed 10/19.  Patient doing well after surgery, was able to stand up with physical therapy.   Acute blood loss anemia Hemoglobin dropped down to 8.1, continue to monitor, add iron B12 level.   COPD with acute exacerbation (HCC) Tobacco abuse : Patient has worsening shortness of breath, wheezing, due to COPD exacerbation.  Patient has increased oxygen requirement from 2 L to 3 L.  CTA negative for PE Respiratory status improving, change steroids to prednisone 40 mg daily. Patient currently still smokes 2 packs of cigarette a day while on oxygen. Had a long discussion with patient and husband, strongly encouraged her to quit Condition is improving, continue prednisone and bronchodilator.   History of liver cirrhosis (HCC): Mental status normal. Ammonia not elevated.   Chest pain: Patient has pleuritic chest pain.  Troponin negative x 2.  CTA negative for PE.     Hyperkalemia:  Potassium 5.5 Hyponatremia. Mild metabolic acidosis. AKI (acute kidney injury) (HCC): Sodium level further dropped down to 125 today after giving half-normal saline postop.  Discontinued half-normal saline, started on normal saline for postop until this afternoon.  Continue sodium 1 g 3 times a day, restarted for restriction. Renal function had improved.  Sodium level is better, reduce sodium to 1 g twice a day.   PUD (peptic ulcer disease) -Protonix   Essential hypertension Continue metoprolol.   Depression with anxiety -Seroquel, Lamictal, Cymbalta      Subjective:  Still complains of short of breath with exertion, leg pain.  Physical Exam: Vitals:   04/27/23 1409 04/27/23 2154 04/28/23 0728 04/28/23 0748  BP:  110/62  120/71  Pulse:  75  73  Resp:  20  14  Temp:  97.7 F (36.5 C)  97.6 F (36.4 C)  TempSrc:  Oral    SpO2: 97% 99% 97% 93%  Weight:       General exam: Appears calm and comfortable  Respiratory system: Significant decreased breathing sounds. Respiratory effort normal. Cardiovascular system: S1 & S2 heard, RRR. No JVD, murmurs, rubs, gallops or clicks. No pedal edema. Gastrointestinal system: Abdomen is nondistended, soft and nontender. No organomegaly or masses felt. Normal bowel sounds heard. Central nervous system: Alert and oriented. No focal neurological deficits. Extremities: Symmetric 5 x 5 power. Skin: No rashes, lesions or ulcers Psychiatry: Judgement and insight appear normal. Mood & affect appropriate.    Data Reviewed:  Lab results reviewed.  Family Communication: None  Disposition: Status is: Inpatient Remains  inpatient appropriate because: Severity of disease, postop day #2.     Time spent: 35 minutes  Author: Marrion Coy, MD 04/28/2023 12:15 PM  For on call review [email or web address].

## 2023-04-28 NOTE — Progress Notes (Addendum)
Subjective: 2 Days Post-Op Procedure(s) (LRB): INTRAMEDULLARY (IM) NAIL INTERTROCHANTERIC (Left) Patient is very somnolent today.  Arousable.  Her dressing is dry and there is mild pain with motion. Hemoglobin 8.1 today. Sputum culture produced Pseudomonas. She remains afebrile. Serum sodium up to 129. Iron levels are at the absolute lowest normal reading at 28.  Patient reports pain as moderate.  Objective:   VITALS:   Vitals:   04/28/23 1337 04/28/23 1524  BP:  110/68  Pulse:  77  Resp:  14  Temp:  98.9 F (37.2 C)  SpO2: 94% 100%    Neurologically intact Sensation intact distally Dorsiflexion/Plantar flexion intact Incision: dressing C/D/I  LABS Recent Labs    04/26/23 0139 04/27/23 0533 04/28/23 0556  HGB 10.2* 9.1* 8.1*  HCT 31.3* 28.3* 24.5*  WBC 6.5 18.7* 12.9*  PLT 179 237 214    Recent Labs    04/26/23 0139 04/27/23 0533 04/27/23 1233 04/28/23 0556  NA 129* 125* 128* 129*  K 3.5 4.5  --  4.9  BUN 26* 38*  --  52*  CREATININE 1.26* 1.78*  --  1.60*  GLUCOSE 147* 197*  --  92    Recent Labs    04/25/23 1719  INR 1.0     Assessment/Plan: 2 Days Post-Op Procedure(s) (LRB): INTRAMEDULLARY (IM) NAIL INTERTROCHANTERIC (Left)   Advance diet Up with therapy Discharge to SNF

## 2023-04-28 NOTE — Progress Notes (Signed)
Nutrition Follow-up  DOCUMENTATION CODES:   Non-severe (moderate) malnutrition in context of chronic illness  INTERVENTION:   -Obtain new wt -Continue Ensure Enlive po BID, each supplement provides 350 kcal and 20 grams of protein -MVI with minerals daily -Continue regular diet  NUTRITION DIAGNOSIS:   Moderate Malnutrition related to chronic illness (COPD) as evidenced by mild fat depletion, moderate fat depletion, mild muscle depletion, moderate muscle depletion.  Ongoing  GOAL:   Patient will meet greater than or equal to 90% of their needs  Progressing   MONITOR:   PO intake, Supplement acceptance  REASON FOR ASSESSMENT:   Consult Hip fracture protocol  ASSESSMENT:   Pt admitted with SOB, chest pain, fall and L hip pain. PMH significant for COPD, HTN, GIB, PUD, depression and anxiety, polysubstance use, chronic pain, liver cirrhosis, breast cancer.  10/21- s/p PROCEDURE:  INTRAMEDULLARY (IM) NAIL INTERTROCHANTERIC left hip   Reviewed I/O's: -701 ml x 24 hours and -61 since admission  UOP: 1.4 L x 24 hours  Spoke with pt, who was sitting in recliner chair at time of visit. Pt reports feeling groggy, but well today. Pt shares she has a good appetite, eating most of her meals. Pt estimates she consumed 50% of her omelette today. Noted documented meal completions 50-100%.   Per pt, her intake has improved over the past year and she has regained some weight back. Pt reports that she lost 70# due to multiple hospitalizations and chronic health issues last years, but has since gained back most of her weight "by eating more". Pt usually consumes one meal per day (salad and a "big bowl of spaghetti with meat sauce"). Pt also supplements with 2 Boost supplements daily.   Reviewed wt hx; wt has been stable over the past 4 months. Noted improvement in exam in comparison to visit last year.   Discussed importance of good meal and supplement intake to promote healing. Offered  chocolate Boost, but pt would like to continue Ensure. Encouraged continued use of supplements at home.   Medications reviewed and include lovenox, ferrous sulfate, neurontin, deltasone, senokot, and sodium chloride.   Labs reviewed: Na: 129, CBGS: 179 (inpatient orders for glycemic control are none).    NUTRITION - FOCUSED PHYSICAL EXAM:  Flowsheet Row Most Recent Value  Orbital Region Mild depletion  Upper Arm Region Mild depletion  Thoracic and Lumbar Region No depletion  Buccal Region Mild depletion  Temple Region Mild depletion  Clavicle Bone Region Mild depletion  Clavicle and Acromion Bone Region Mild depletion  Scapular Bone Region Mild depletion  Dorsal Hand Mild depletion  Patellar Region Mild depletion  Anterior Thigh Region Mild depletion  Posterior Calf Region Mild depletion  Edema (RD Assessment) None  Hair Reviewed  Eyes Reviewed  Mouth Reviewed  Skin Reviewed  Nails Reviewed       Diet Order:   Diet Order             Diet regular Room service appropriate? Yes; Fluid consistency: Thin; Fluid restriction: 1200 mL Fluid  Diet effective now                   EDUCATION NEEDS:   Education needs have been addressed  Skin:  Skin Assessment: Reviewed RN Assessment  Last BM:  04/28/23 (type 6)  Height:   Ht Readings from Last 1 Encounters:  12/24/22 5\' 6"  (1.676 m)    Weight:   Wt Readings from Last 1 Encounters:  04/25/23 59 kg  Ideal Body Weight:  59.1 kg  BMI:  Body mass index is 20.99 kg/m.  Estimated Nutritional Needs:   Kcal:  1750-1950  Protein:  90-105 grams  Fluid:  > 1.7 L    Levada Schilling, RD, LDN, CDCES Registered Dietitian III Certified Diabetes Care and Education Specialist Please refer to [facility] for RD and/or RD on-call/weekend/after hours pager

## 2023-04-29 DIAGNOSIS — S72002A Fracture of unspecified part of neck of left femur, initial encounter for closed fracture: Secondary | ICD-10-CM | POA: Diagnosis not present

## 2023-04-29 DIAGNOSIS — D62 Acute posthemorrhagic anemia: Secondary | ICD-10-CM | POA: Diagnosis not present

## 2023-04-29 DIAGNOSIS — J441 Chronic obstructive pulmonary disease with (acute) exacerbation: Secondary | ICD-10-CM | POA: Diagnosis not present

## 2023-04-29 LAB — CBC
HCT: 25.3 % — ABNORMAL LOW (ref 36.0–46.0)
Hemoglobin: 8.1 g/dL — ABNORMAL LOW (ref 12.0–15.0)
MCH: 33.3 pg (ref 26.0–34.0)
MCHC: 32 g/dL (ref 30.0–36.0)
MCV: 104.1 fL — ABNORMAL HIGH (ref 80.0–100.0)
Platelets: 213 10*3/uL (ref 150–400)
RBC: 2.43 MIL/uL — ABNORMAL LOW (ref 3.87–5.11)
RDW: 13.4 % (ref 11.5–15.5)
WBC: 10.8 10*3/uL — ABNORMAL HIGH (ref 4.0–10.5)
nRBC: 0 % (ref 0.0–0.2)

## 2023-04-29 LAB — POTASSIUM: Potassium: 5.1 mmol/L (ref 3.5–5.1)

## 2023-04-29 LAB — BASIC METABOLIC PANEL
Anion gap: 6 (ref 5–15)
BUN: 51 mg/dL — ABNORMAL HIGH (ref 8–23)
CO2: 29 mmol/L (ref 22–32)
Calcium: 9 mg/dL (ref 8.9–10.3)
Chloride: 100 mmol/L (ref 98–111)
Creatinine, Ser: 1.26 mg/dL — ABNORMAL HIGH (ref 0.44–1.00)
GFR, Estimated: 47 mL/min — ABNORMAL LOW (ref >60)
Glucose, Bld: 91 mg/dL (ref 70–99)
Potassium: 5.9 mmol/L — ABNORMAL HIGH (ref 3.5–5.1)
Sodium: 135 mmol/L (ref 135–145)

## 2023-04-29 LAB — VITAMIN B12: Vitamin B-12: 297 pg/mL (ref 180–914)

## 2023-04-29 MED ORDER — PREDNISONE 20 MG PO TABS
20.0000 mg | ORAL_TABLET | Freq: Every day | ORAL | Status: DC
  Filled 2023-04-29: qty 1

## 2023-04-29 MED ORDER — VITAMIN B-12 1000 MCG PO TABS
1000.0000 ug | ORAL_TABLET | Freq: Every day | ORAL | Status: DC
  Administered 2023-04-29 – 2023-05-02 (×4): 1000 ug via ORAL
  Filled 2023-04-29 (×4): qty 1

## 2023-04-29 MED ORDER — OXYCODONE-ACETAMINOPHEN 5-325 MG PO TABS
1.0000 | ORAL_TABLET | ORAL | Status: DC | PRN
  Administered 2023-04-29 – 2023-05-01 (×10): 1 via ORAL
  Administered 2023-05-01: 2 via ORAL
  Administered 2023-05-01 – 2023-05-02 (×7): 1 via ORAL
  Filled 2023-04-29 (×13): qty 1
  Filled 2023-04-29: qty 2
  Filled 2023-04-29 (×2): qty 1
  Filled 2023-04-29: qty 2
  Filled 2023-04-29: qty 1

## 2023-04-29 MED ORDER — SODIUM ZIRCONIUM CYCLOSILICATE 10 G PO PACK
10.0000 g | PACK | Freq: Once | ORAL | Status: AC
  Administered 2023-04-29: 10 g via ORAL
  Filled 2023-04-29: qty 1

## 2023-04-29 NOTE — Progress Notes (Signed)
Progress Note   Patient: Regina Bernard VHQ:[id number] DOB: 11/06/1955 DOA: 04/25/2023     4 DOS: the patient was seen and examined on 04/29/2023   Brief hospital course: Regina Bernard is a 67 y.o. female with medical history significant of COPD on 2L O2, HTN, GI bleeding, PUD, depression with anxiety, polysubstance abuse, chronic pain, liver cirrhosis, breast cancer, who presents with shortness of breath, chest pain, fall and left hip pain.  INTERTROCHANTERIC left hip nail performed on 10/19.   Principal Problem:   Closed left hip fracture (HCC) Active Problems:   ABLA (acute blood loss anemia)   Fall at home, initial encounter   COPD with acute exacerbation (HCC)   Liver cirrhosis (HCC)   Chest pain   Hyperkalemia   AKI (acute kidney injury) (HCC)   PUD (peptic ulcer disease)   Essential hypertension   Depression with anxiety   Protein-calorie malnutrition, moderate (HCC)   Assessment and Plan: Closed left hip fracture (HCC): Fall at home, initial encounter   X-ray of left hip/pelvis that showed displaced intertrochanteric left proximal femur fracture.  Hip surgery performed 10/19.   Patient doing well with the physical therapy, pending nursing home placement.   Acute blood loss anemia Hemoglobin dropped down to 8.1, iron level normal, B12 level borderline at 297, added homocystine level, started B12 supplement.   COPD with acute exacerbation (HCC) Tobacco abuse : Patient has worsening shortness of breath, wheezing, due to COPD exacerbation.  Patient has increased oxygen requirement from 2 L to 3 L.  CTA negative for PE Patient currently still smokes 2 packs of cigarette a day while on oxygen.  Advised patient to quit smoking. Bronchospasm much improved, reduce prednisone to 20 mg daily.  Continue bronchodilator.   History of liver cirrhosis (HCC): Mental status normal. Ammonia not elevated.   Chest pain: Patient has pleuritic chest pain.  Troponin negative x 2.   CTA negative for PE.     Hyperkalemia:  Hyponatremia. Mild metabolic acidosis. AKI (acute kidney injury) (HCC): Sodium level is better after giving salt tablets, discontinue salt tablets, continue fluid striction. Renal function has improved.  Patient developed hyperkalemia again today, give another dose of Lokelma, repeat potassium better.   PUD (peptic ulcer disease) -Protonix   Essential hypertension Continue metoprolol.   Depression with anxiety -Seroquel, Lamictal, Cymbalta       Subjective:  Short of breath much better, still has significant pain from surgery site  Physical Exam: Vitals:   04/29/23 0244 04/29/23 0724 04/29/23 0811 04/29/23 1426  BP: 115/74  (!) 140/73 (!) 141/84  Pulse: 75  73 92  Resp: 17  14 14   Temp: 98.3 F (36.8 C)  97.8 F (36.6 C) 98.1 F (36.7 C)  TempSrc:      SpO2: 100% 100% 100% 100%  Weight:       General exam: Appears calm and comfortable  Respiratory system: Decreased breath sounds. Respiratory effort normal. Cardiovascular system: S1 & S2 heard, RRR. No JVD, murmurs, rubs, gallops or clicks. No pedal edema. Gastrointestinal system: Abdomen is nondistended, soft and nontender. No organomegaly or masses felt. Normal bowel sounds heard. Central nervous system: Alert and oriented. No focal neurological deficits. Extremities: Symmetric 5 x 5 power. Skin: No rashes, lesions or ulcers Psychiatry: Judgement and insight appear normal. Mood & affect appropriate.    Data Reviewed:  There are no new results to review at this time.  Family Communication:   Disposition: Status is: Inpatient Remains inpatient appropriate because:  Unsafe discharge, pending nursing placement. Patient is medically ready for discharge.     Time spent: 35 minutes  Author: Marrion Coy, MD 04/29/2023 2:32 PM  For on call review [email or web address].

## 2023-04-29 NOTE — [place] FL2 (Signed)
[place] MEDICAID FL2 LEVEL OF CARE FORM     IDENTIFICATION  Patient Name: Regina Bernard Birthdate: 04/06/1956 Sex: female Admission Date (Current Location): 04/25/2023  [facility] and [place] Number:  Chiropodist and Address:  [facility], [address], [place], [place] [address]      Provider Number: [id number]  Attending Physician Name and Address:  Marrion Coy, MD  Relative Name and Phone Number:  Greggory Stallion SPouse [phone]    Current Level of Care: Hospital Recommended Level of Care: Skilled Nursing Facility Prior Approval Number:    Date Approved/Denied:   PASRR Number: [id number]  Discharge Plan:      Current Diagnoses: Patient Active Problem List   Diagnosis Date Noted   Closed left hip fracture (HCC) 04/25/2023   Fall at home, initial encounter 04/25/2023   Postpartum depression associated with seventh pregnancy 04/25/2023   Depression with anxiety 04/25/2023   Chest pain 04/25/2023   Hyperkalemia 04/25/2023   Spinal stenosis, lumbar region, with neurogenic claudication 12/24/2022   Foraminal stenosis of lumbar region 12/24/2022   Compression fracture of L1 lumbar vertebra (HCC) 12/24/2022   History of kyphoplasty 12/24/2022   Polysubstance abuse (HCC) (hx of) 12/24/2022   Chronic pain syndrome 12/24/2022   Protein-calorie malnutrition, moderate (HCC) 07/04/2022   Necrotizing pneumonia (HCC) 07/03/2022   Aspiration pneumonia (HCC) 06/28/2022   Acute hypoxic respiratory failure (HCC) 06/28/2022   Septic shock (HCC) 06/28/2022   AKI (acute kidney injury) (HCC) 06/28/2022   Hyponatremia 06/28/2022   Screening for colon cancer    Ulceration of intestine    PUD (peptic ulcer disease)    History of Helicobacter pylori infection    Vapes nicotine containing substance 11/01/2021   H pylori ulcer    Upper GI bleed 10/28/2021   Generalized anxiety disorder 10/28/2021   Essential hypertension 10/28/2021   ABLA (acute  blood loss anemia) 10/28/2021   Liver cirrhosis (HCC) 10/28/2021   COPD with acute exacerbation (HCC) 01/14/2018   Carcinoma of overlapping sites of right breast in female, estrogen receptor positive (HCC) 04/18/2016   Severe chronic obstructive pulmonary disease (HCC) 11/16/2013    Orientation RESPIRATION BLADDER Height & Weight     Self, Time, Situation, Place  Normal Continent Weight: 59.2 kg Height:     BEHAVIORAL SYMPTOMS/MOOD NEUROLOGICAL BOWEL NUTRITION STATUS      Continent Diet (see DC summary)  AMBULATORY STATUS COMMUNICATION OF NEEDS Skin   Extensive Assist Verbally Normal                       Personal Care Assistance Level of Assistance  Bathing, Feeding, Dressing Bathing Assistance: Limited assistance Feeding assistance: Independent Dressing Assistance: Limited assistance     Functional Limitations Info  Sight, Hearing, Speech Sight Info: Adequate Hearing Info: Adequate Speech Info: Adequate    SPECIAL CARE FACTORS FREQUENCY  PT (By licensed PT), OT (By licensed OT)     PT Frequency: 5 times per week OT Frequency: 5 times per week            Contractures Contractures Info: Not present    Additional Factors Info  Code Status, Allergies Code Status Info: full code Allergies Info: penicillin           Current Medications (04/29/2023):  This is the current hospital active medication list Current Facility-Administered Medications  Medication Dose Route Frequency Provider Last Rate Last Admin   acetaminophen (TYLENOL) tablet 650 mg  650 mg Oral Q6H PRN  Deeann Saint, MD       albuterol (PROVENTIL) (2.5 MG/3ML) 0.083% nebulizer solution 2.5 mg  2.5 mg Nebulization Q4H PRN Deeann Saint, MD   2.5 mg at 04/26/23 0918   alum & mag hydroxide-simeth (MAALOX/MYLANTA) 200-200-20 MG/5ML suspension 30 mL  30 mL Oral Q4H PRN Deeann Saint, MD       azithromycin [facility]) tablet 250 mg  250 mg Oral Daily Deeann Saint, MD   250 mg at 04/29/23 5638    bisacodyl (DULCOLAX) suppository 10 mg  10 mg Rectal Daily PRN Deeann Saint, MD       cyanocobalamin (VITAMIN B12) tablet 1,000 mcg  1,000 mcg Oral Daily Marrion Coy, MD       dextromethorphan-guaiFENesin [facility]) 30-600 MG per 12 hr tablet 1 tablet  1 tablet Oral BID PRN Deeann Saint, MD       DULoxetine (CYMBALTA) DR capsule 30 mg  30 mg Oral BID Deeann Saint, MD   30 mg at 04/29/23 0938   enoxaparin (LOVENOX) injection 30 mg  30 mg Subcutaneous Q24H Deeann Saint, MD   30 mg at 04/29/23 7564   feeding supplement (ENSURE ENLIVE / ENSURE PLUS) liquid 237 mL  237 mL Oral BID BM Deeann Saint, MD   237 mL at 04/29/23 1004   ferrous sulfate tablet 325 mg  325 mg Oral Q breakfast Deeann Saint, MD   325 mg at 04/29/23 3329   gabapentin (NEURONTIN) capsule 300 mg  300 mg Oral TID Deeann Saint, MD   300 mg at 04/29/23 1129   hydrALAZINE (APRESOLINE) injection 5 mg  5 mg Intravenous Q2H PRN Deeann Saint, MD       ipratropium-albuterol (DUONEB) 0.5-2.5 (3) MG/3ML nebulizer solution 3 mL  3 mL Nebulization Q6H Marrion Coy, MD   3 mL at 04/29/23 5188   lamoTRIgine (LAMICTAL) tablet 100 mg  100 mg Oral Daily Deeann Saint, MD   100 mg at 04/29/23 1129   lidocaine (LIDODERM) 5 % 1 patch  1 patch Transdermal Q24H Deeann Saint, MD   1 patch at 04/28/23 2040   loperamide (IMODIUM) capsule 2 mg  2 mg Oral PRN Deeann Saint, MD       menthol-cetylpyridinium (CEPACOL) lozenge 3 mg  1 lozenge Oral PRN Deeann Saint, MD       Or   phenol (CHLORASEPTIC) mouth spray 1 spray  1 spray Mouth/Throat PRN Deeann Saint, MD       methocarbamol (ROBAXIN) tablet 500 mg  500 mg Oral Q8H PRN Deeann Saint, MD       metoCLOPramide (REGLAN) tablet 5-10 mg  5-10 mg Oral Q8H PRN Deeann Saint, MD       Or   metoCLOPramide (REGLAN) injection 5-10 mg  5-10 mg Intravenous Q8H PRN Deeann Saint, MD       metoprolol succinate (TOPROL-XL) 24 hr tablet 100 mg  100 mg Oral Daily Deeann Saint, MD   100 mg at  04/29/23 4166   multivitamin with minerals tablet 1 tablet  1 tablet Oral Daily Deeann Saint, MD   1 tablet at 04/29/23 0630   nicotine (NICODERM CQ - dosed in mg/24 hours) patch 21 mg  21 mg Transdermal Daily Deeann Saint, MD   21 mg at 04/29/23 0941   nitroGLYCERIN (NITROSTAT) SL tablet 0.4 mg  0.4 mg Sublingual Q5 min PRN Deeann Saint, MD       ondansetron [facility]) injection 4 mg  4 mg Intravenous Q8H PRN Deeann Saint, MD  oxyCODONE-acetaminophen (PERCOCET/ROXICET) 5-325 MG per tablet 1-2 tablet  1-2 tablet Oral Q4H PRN Marrion Coy, MD   1 tablet at 04/29/23 1423   pantoprazole (PROTONIX) EC tablet 40 mg  40 mg Oral Daily Deeann Saint, MD   40 mg at 04/29/23 0938   [START ON 04/30/2023] predniSONE (DELTASONE) tablet 20 mg  20 mg Oral Q breakfast Marrion Coy, MD       QUEtiapine (SEROQUEL) tablet 25 mg  25 mg Oral QHS PRN Deeann Saint, MD       senna M[facility]) tablet 8.6 mg  1 tablet Oral BID Deeann Saint, MD   8.6 mg at 04/29/23 0865   senna-docusate (Senokot-S) tablet 1 tablet  1 tablet Oral QHS PRN Deeann Saint, MD       sodium chloride flush (NS) 0.9 % injection 3 mL  3 mL Intravenous Q12H Deeann Saint, MD   3 mL at 04/29/23 1424   sodium phosphate (FLEET) enema 1 enema  1 enema Rectal Once PRN Deeann Saint, MD         Discharge Medications: Please see discharge summary for a list of discharge medications.  Relevant Imaging Results:  Relevant Lab Results:   Additional Information SS#[id number]  Marlowe Sax, RN

## 2023-04-29 NOTE — Plan of Care (Signed)

## 2023-04-29 NOTE — Progress Notes (Signed)
Occupational Therapy Treatment Patient Details Name: Regina Bernard MRN: [id number] DOB: [birth year] Today's Date: 04/29/2023   History of present illness Pt is a 67 year old female presenting with presents with shortness of breath, chest pain, fall and left hip pain; L IMN performed on 10/19    PMH significant for COPD on 2L O2, HTN, GI bleeding, PUD, depression with anxiety, polysubstance abuse, chronic pain, liver cirrhosis, breast cancer   OT comments  Pt is supine in bed on arrival. Easily arousable and agreeable to OT session. She reports 7/10 pain to L hip and was premedicated by nurse prior to session. Pt performed bed mobility during session with Min A for LLE management. While seated EOB she performed oral hygiene and face washing with set up assist. Agreeable to STS from EOB to RW with Mod A, verb cues to push up from bed and Min A for steadying balance in standing with ability to take x3 lateral steps/slides to [facility] with little to no WB through LLE d/t pt preference. 02 sats WFL throughout session and pt returned to bed with all needs in place and will cont to require skilled acute OT services to maximize her safety and IND to return to PLOF.        If plan is discharge home, recommend the following:  A lot of help with walking and/or transfers;A lot of help with bathing/dressing/bathroom;Assist for transportation;Help with stairs or ramp for entrance;Assistance with cooking/housework   Equipment Recommendations  Other (comment) (defer)    Recommendations for Other Services      Precautions / Restrictions Precautions Precautions: Fall Restrictions Weight Bearing Restrictions: Yes LLE Weight Bearing: Partial weight bearing LLE Partial Weight Bearing Percentage or Pounds: 50%       Mobility Bed Mobility Overal bed mobility: Needs Assistance Bed Mobility: Supine to Sit, Sit to Supine     Supine to sit: Min assist, HOB elevated, Used rails Sit to supine: HOB elevated,  Min assist   General bed mobility comments: Min A for LLE management for bed mobility tasks    Transfers Overall transfer level: Needs assistance Equipment used: Rolling walker (2 wheels) Transfers: Sit to/from Stand Sit to Stand: Mod assist, From elevated surface           General transfer comment: MOD A for STS from EOB with it slightly elevated for increased ease to LLE pain; Min A for steadying to take 3 lateral steps/slides to [facility] with pt maintaining WB precautions     Balance Overall balance assessment: Needs assistance Sitting-balance support: Feet supported, Bilateral upper extremity supported Sitting balance-Leahy Scale: Good     Standing balance support: Bilateral upper extremity supported, During functional activity, Reliant on assistive device for balance Standing balance-Leahy Scale: Poor Standing balance comment: increased reliance on RW with standing; poor balance, decreased weight shift onto RLE                           ADL either performed or assessed with clinical judgement   ADL Overall ADL's : Needs assistance/impaired     Grooming: Wash/dry face;Sitting;Set up;Oral care                                 General ADL Comments: seated EOB with S/U assist    Extremity/Trunk Assessment     Lower Extremity Assessment Lower Extremity Assessment: Generalized weakness;LLE deficits/detail LLE Deficits / Details:  s/p L IMN        Vision       Perception     Praxis      Cognition Arousal: Alert Behavior During Therapy: WFL for tasks assessed/performed, Anxious Overall Cognitive Status: Within Functional Limits for tasks assessed                                          Exercises Other Exercises Other Exercises: Edu on importance of participating with therapy during visits to promote mobility, decreased stiffness, etc. Pt verbalized understanding.    Shoulder Instructions       General Comments       Pertinent Vitals/ Pain       Pain Assessment Pain Assessment: 0-10 Pain Score: 7  Pain Location: L hip Pain Descriptors / Indicators: Aching, Grimacing, Guarding, Moaning Pain Intervention(s): Monitored during session, Premedicated before session, Limited activity within patient's tolerance  Home Living                                          Prior Functioning/Environment              Frequency  Min 1X/week        Progress Toward Goals  OT Goals(current goals can now be found in the care plan section)  Progress towards OT goals: Progressing toward goals  Acute Rehab OT Goals Patient Stated Goal: improve strength and decrease pain OT Goal Formulation: With patient Time For Goal Achievement: 05/11/23 Potential to Achieve Goals: Good  Plan      Co-evaluation                 AM-PAC OT "6 Clicks" Daily Activity     Outcome Measure   Help from another person eating meals?: None Help from another person taking care of personal grooming?: None Help from another person toileting, which includes using toliet, bedpan, or urinal?: A Lot Help from another person bathing (including washing, rinsing, drying)?: A Lot Help from another person to put on and taking off regular upper body clothing?: A Little Help from another person to put on and taking off regular lower body clothing?: A Lot 6 Click Score: 17    End of Session Equipment Utilized During Treatment: Gait belt;Rolling walker (2 wheels)  OT Visit Diagnosis: Other abnormalities of gait and mobility (R26.89);Muscle weakness (generalized) (M62.81)   Activity Tolerance Patient limited by pain;Patient tolerated treatment well   Patient Left in bed;with call bell/phone within reach;with bed alarm set   Nurse Communication Mobility status        Time: 1450-1520 OT Time Calculation (min): 30 min  Charges: OT General Charges $OT Visit: 1 Visit OT Treatments $Self Care/Home  Management : 8-22 mins $Therapeutic Activity: 8-22 mins  Lexy Meininger, OTR/L  04/29/23, 4:30 PM  Constance Goltz 04/29/2023, 4:28 PM

## 2023-04-29 NOTE — Plan of Care (Signed)

## 2023-04-29 NOTE — Progress Notes (Signed)
Physical Therapy Treatment Patient Details Name: Regina Bernard MRN: [id number] DOB: 04/18/1956 Today's Date: 04/29/2023   History of Present Illness Pt is a 67 year old female presenting with presents with shortness of breath, chest pain, fall and left hip pain; L IMN performed on 10/19    PMH significant for COPD on 2L O2, HTN, GI bleeding, PUD, depression with anxiety, polysubstance abuse, chronic pain, liver cirrhosis, breast cancer    PT Comments  Pt wakes to voice but reported significant pain with any mobility. Also noted for elevated K+, session limited due to this and pain levels. Pt performed supine LLE AAROM exercises, but exhibited significant pain signs/symptoms throughout. Repositioned with needs in reach, PT to attempt OOB mobility as able next session. RN/MD notified of pt pain complaints. The patient would benefit from further skilled PT intervention to progress towards goals.    If plan is discharge home, recommend the following: A lot of help with walking and/or transfers;A lot of help with bathing/dressing/bathroom;Help with stairs or ramp for entrance;Assist for transportation   Can travel by private vehicle     No  Equipment Recommendations  Other (comment) (TBD)    Recommendations for Other Services       Precautions / Restrictions Precautions Precautions: Fall Restrictions Weight Bearing Restrictions: Yes LLE Weight Bearing: Partial weight bearing LLE Partial Weight Bearing Percentage or Pounds: 50%     Mobility  Bed Mobility               General bed mobility comments: unable to attempt due to elevated pain levels    Transfers                        Ambulation/Gait                   Stairs             Wheelchair Mobility     Tilt Bed    Modified Rankin (Stroke Patients Only)       Balance                                            Cognition Arousal: Alert Behavior During Therapy:  WFL for tasks assessed/performed, Anxious Overall Cognitive Status: Within Functional Limits for tasks assessed                                          Exercises General Exercises - Lower Extremity Ankle Circles/Pumps: AROM, Both, 10 reps Short Arc Quad: AROM, Strengthening, Left, 10 reps Heel Slides: AAROM, Strengthening, Left, 10 reps Hip ABduction/ADduction: AAROM, Strengthening, Left, 10 reps    General Comments        Pertinent Vitals/Pain Pain Assessment Pain Assessment: 0-10 Pain Score: 10-Worst pain ever Pain Location: L hip Pain Descriptors / Indicators: Aching, Grimacing, Guarding, Crying, Moaning Pain Intervention(s): Limited activity within patient's tolerance, Monitored during session, Repositioned    Home Living                          Prior Function            PT Goals (current goals can now be found in the care plan section) Progress towards PT goals:  Progressing toward goals    Frequency    7X/week      PT Plan      Co-evaluation              AM-PAC PT "6 Clicks" Mobility   Outcome Measure  Help needed turning from your back to your side while in a flat bed without using bedrails?: A Little Help needed moving from lying on your back to sitting on the side of a flat bed without using bedrails?: A Little Help needed moving to and from a bed to a chair (including a wheelchair)?: A Lot Help needed standing up from a chair using your arms (e.g., wheelchair or bedside chair)?: A Lot Help needed to walk in hospital room?: Total Help needed climbing 3-5 steps with a railing? : Total 6 Click Score: 12    End of Session   Activity Tolerance: Patient limited by pain Patient left: in bed;with call bell/phone within reach;with bed alarm set   PT Visit Diagnosis: Other abnormalities of gait and mobility (R26.89);Difficulty in walking, not elsewhere classified (R26.2);History of falling (Z91.81);Muscle weakness  (generalized) (M62.81);Unsteadiness on feet (R26.81);Pain Pain - Right/Left: Left Pain - part of body: Hip     Time: 6433-2951 PT Time Calculation (min) (ACUTE ONLY): 10 min  Charges:    $Therapeutic Exercise: 8-22 mins PT General Charges $$ ACUTE PT VISIT: 1 Visit                     Olga Coaster PT, DPT 3:53 PM,04/29/23

## 2023-04-29 NOTE — TOC Initial Note (Signed)
Transition of Care [facility]) - Initial/Assessment Note    Patient Details  Name: Regina Bernard MRN: [id number] Date of Birth: Oct 19, 1955  [facility] of [facility]) CM/SW Contact:    Marlowe Sax, RN Phone Number: 04/29/2023, 3:43 PM  Clinical Narrative:                 Met with the patient at the bedside, she is agreeable to a bed search and would like to go to [place], I explained the bedsearch process and she is agreeable, PASSR obtained, Bedsearch sent, will review bed offers once obtained  Expected Discharge Plan: Skilled Nursing Facility Barriers to Discharge: SNF Pending bed offer, Insurance Authorization   Patient Goals and CMS Choice            Expected Discharge Plan and Services       Living arrangements for the past 2 months: Single Family Home                                      Prior Living Arrangements/Services Living arrangements for the past 2 months: Single Family Home Lives with:: Spouse Patient language and need for interpreter reviewed:: Yes Do you feel safe going back to the place where you live?: Yes            Criminal Activity/Legal Involvement Pertinent to Current Situation/Hospitalization: No - Comment as needed  Activities of Daily Living      Permission Sought/Granted   Permission granted to share information with : Yes, Verbal Permission Granted              Emotional Assessment Appearance:: Appears older than stated age   Affect (typically observed): Pleasant Orientation: : Oriented to Self, Oriented to Place, Oriented to  Time, Oriented to Situation Alcohol / Substance Use: Not Applicable Psych Involvement: No (comment)  Admission diagnosis:  Chronic respiratory failure with hypoxia (HCC) [J96.11] AKI (acute kidney injury) (HCC) [N17.9] Closed left hip fracture (HCC) [S72.002A] Closed intertrochanteric fracture of left femur, initial encounter (HCC) [S72.142A] Patient Active Problem List   Diagnosis Date  Noted   Closed left hip fracture (HCC) 04/25/2023   Fall at home, initial encounter 04/25/2023   Postpartum depression associated with seventh pregnancy 04/25/2023   Depression with anxiety 04/25/2023   Chest pain 04/25/2023   Hyperkalemia 04/25/2023   Spinal stenosis, lumbar region, with neurogenic claudication 12/24/2022   Foraminal stenosis of lumbar region 12/24/2022   Compression fracture of L1 lumbar vertebra (HCC) 12/24/2022   History of kyphoplasty 12/24/2022   Polysubstance abuse (HCC) (hx of) 12/24/2022   Chronic pain syndrome 12/24/2022   Protein-calorie malnutrition, moderate (HCC) 07/04/2022   Necrotizing pneumonia (HCC) 07/03/2022   Aspiration pneumonia (HCC) 06/28/2022   Acute hypoxic respiratory failure (HCC) 06/28/2022   Septic shock (HCC) 06/28/2022   AKI (acute kidney injury) (HCC) 06/28/2022   Hyponatremia 06/28/2022   Screening for colon cancer    Ulceration of intestine    PUD (peptic ulcer disease)    History of Helicobacter pylori infection    Vapes nicotine containing substance 11/01/2021   H pylori ulcer    Upper GI bleed 10/28/2021   Generalized anxiety disorder 10/28/2021   Essential hypertension 10/28/2021   ABLA (acute blood loss anemia) 10/28/2021   Liver cirrhosis (HCC) 10/28/2021   COPD with acute exacerbation (HCC) 01/14/2018   Carcinoma of overlapping sites of right breast in female, estrogen receptor positive (  HCC) 04/18/2016   Severe chronic obstructive pulmonary disease (HCC) 11/16/2013   PCP:  Dorothey Baseman, MD Pharmacy:   [facility] [phone] [place], [place] - [address] [place] [address] Phone: [phone] Fax: [phone]  [facility]) - [place], [place] - [address] [facility] [address] [place] [address] [place] [address] Phone: [phone] Fax: [phone]     Social Determinants of Health (SDOH) Social History: SDOH Screenings   Tobacco Use: High Risk  (04/25/2023)   SDOH Interventions:     Readmission Risk Interventions     No data to display

## 2023-04-30 DIAGNOSIS — E875 Hyperkalemia: Secondary | ICD-10-CM | POA: Diagnosis not present

## 2023-04-30 DIAGNOSIS — S72002S Fracture of unspecified part of neck of left femur, sequela: Secondary | ICD-10-CM

## 2023-04-30 DIAGNOSIS — J441 Chronic obstructive pulmonary disease with (acute) exacerbation: Secondary | ICD-10-CM | POA: Diagnosis not present

## 2023-04-30 DIAGNOSIS — E440 Moderate protein-calorie malnutrition: Secondary | ICD-10-CM

## 2023-04-30 DIAGNOSIS — D62 Acute posthemorrhagic anemia: Secondary | ICD-10-CM | POA: Diagnosis not present

## 2023-04-30 LAB — CBC
HCT: 23.6 % — ABNORMAL LOW (ref 36.0–46.0)
Hemoglobin: 7.9 g/dL — ABNORMAL LOW (ref 12.0–15.0)
MCH: 34.1 pg — ABNORMAL HIGH (ref 26.0–34.0)
MCHC: 33.5 g/dL (ref 30.0–36.0)
MCV: 101.7 fL — ABNORMAL HIGH (ref 80.0–100.0)
Platelets: 230 10*3/uL (ref 150–400)
RBC: 2.32 MIL/uL — ABNORMAL LOW (ref 3.87–5.11)
RDW: 13.2 % (ref 11.5–15.5)
WBC: 8.5 10*3/uL (ref 4.0–10.5)
nRBC: 0 % (ref 0.0–0.2)

## 2023-04-30 LAB — BASIC METABOLIC PANEL
Anion gap: 6 (ref 5–15)
BUN: 45 mg/dL — ABNORMAL HIGH (ref 8–23)
CO2: 29 mmol/L (ref 22–32)
Calcium: 8.8 mg/dL — ABNORMAL LOW (ref 8.9–10.3)
Chloride: 97 mmol/L — ABNORMAL LOW (ref 98–111)
Creatinine, Ser: 1.1 mg/dL — ABNORMAL HIGH (ref 0.44–1.00)
GFR, Estimated: 55 mL/min — ABNORMAL LOW (ref >60)
Glucose, Bld: 94 mg/dL (ref 70–99)
Potassium: 5.5 mmol/L — ABNORMAL HIGH (ref 3.5–5.1)
Sodium: 132 mmol/L — ABNORMAL LOW (ref 135–145)

## 2023-04-30 LAB — HOMOCYSTEINE: Homocysteine: 39 umol/L — ABNORMAL HIGH (ref 0.0–17.2)

## 2023-04-30 MED ORDER — METHYLPREDNISOLONE SODIUM SUCC 40 MG IJ SOLR
40.0000 mg | Freq: Every day | INTRAMUSCULAR | Status: DC
  Administered 2023-04-30 – 2023-05-02 (×3): 40 mg via INTRAVENOUS
  Filled 2023-04-30 (×3): qty 1

## 2023-04-30 MED ORDER — UMECLIDINIUM BROMIDE 62.5 MCG/ACT IN AEPB
1.0000 | INHALATION_SPRAY | Freq: Every day | RESPIRATORY_TRACT | Status: DC
  Administered 2023-04-30 – 2023-05-02 (×3): 1 via RESPIRATORY_TRACT
  Filled 2023-04-30: qty 7

## 2023-04-30 MED ORDER — IPRATROPIUM-ALBUTEROL 0.5-2.5 (3) MG/3ML IN SOLN
3.0000 mL | Freq: Three times a day (TID) | RESPIRATORY_TRACT | Status: DC
  Administered 2023-05-01 – 2023-05-02 (×5): 3 mL via RESPIRATORY_TRACT
  Filled 2023-04-30 (×5): qty 3

## 2023-04-30 MED ORDER — SODIUM ZIRCONIUM CYCLOSILICATE 10 G PO PACK
10.0000 g | PACK | Freq: Once | ORAL | Status: AC
  Administered 2023-04-30: 10 g via ORAL
  Filled 2023-04-30: qty 1

## 2023-04-30 MED ORDER — FLUTICASONE FUROATE-VILANTEROL 100-25 MCG/ACT IN AEPB
1.0000 | INHALATION_SPRAY | Freq: Every day | RESPIRATORY_TRACT | Status: DC
  Administered 2023-04-30 – 2023-05-02 (×3): 1 via RESPIRATORY_TRACT
  Filled 2023-04-30: qty 28

## 2023-04-30 MED ORDER — SODIUM ZIRCONIUM CYCLOSILICATE 10 G PO PACK
10.0000 g | PACK | Freq: Once | ORAL | Status: DC
  Administered 2023-04-30: 10 g via ORAL
  Filled 2023-04-30 (×2): qty 1

## 2023-04-30 NOTE — Progress Notes (Signed)
Subjective: 4 Days Post-Op Procedure(s) (LRB): INTRAMEDULLARY (IM) NAIL INTERTROCHANTERIC (Left) Patient is much more alert and oriented today.  Pain is moderate she says.  She has made slow progress with PT.  Her pulmonary problems remain her biggest concern. Hemoglobin is 7.9.  White blood count is normal. Social work is working on a rehab bed  Patient reports pain as mild.  Objective:   VITALS:   Vitals:   04/30/23 1007 04/30/23 1402  BP: 106/76   Pulse: 86 85  Resp:  18  Temp:    SpO2: 100% 98%    Neurologically intact Sensation intact distally Dorsiflexion/Plantar flexion intact Incision: dressing C/D/I  LABS Recent Labs    04/28/23 0556 04/29/23 0527 04/30/23 0526  HGB 8.1* 8.1* 7.9*  HCT 24.5* 25.3* 23.6*  WBC 12.9* 10.8* 8.5  PLT 214 213 230    Recent Labs    04/28/23 0556 04/29/23 0527 04/29/23 1336 04/30/23 0526  NA 129* 135  --  132*  K 4.9 5.9* 5.1 5.5*  BUN 52* 51*  --  45*  CREATININE 1.60* 1.26*  --  1.10*  GLUCOSE 92 91  --  94    No results for input(s): "LABPT", "INR" in the last 72 hours.   Assessment/Plan: 4 Days Post-Op Procedure(s) (LRB): INTRAMEDULLARY (IM) NAIL INTERTROCHANTERIC (Left)   Advance diet Up with therapy Discharge to SNF when available.

## 2023-04-30 NOTE — Assessment & Plan Note (Signed)
Continue supplements

## 2023-04-30 NOTE — Progress Notes (Signed)
Physical Therapy Treatment Patient Details Name: Regina Bernard MRN: [id number] DOB: 1955/11/26 Today's Date: 04/30/2023   History of Present Illness Pt is a 67 year old female presenting with presents with shortness of breath, chest pain, fall and left hip pain; L IMN performed on 10/19    PMH significant for COPD on 2L O2, HTN, GI bleeding, PUD, depression with anxiety, polysubstance abuse, chronic pain, liver cirrhosis, breast cancer    PT Comments  Pt alert and reporting decreased L hip pain (6/10) vs previous session. Increased O2 to 3L via [place] with all mobility.  MinA at LLE for bed mobility and CGA for transfers with RW, performing stand pivot on RLE to recliner. Pt maintained PWBing status on LLE and was limited 2/2 pain and SOB (requiring inhaler upon sitting in recliner). Pt in recliner with needs in reach and nursing notified of pain levels. Pt would benefit from continued skilled PT intervention to progress toward mobility goals.    If plan is discharge home, recommend the following: A lot of help with walking and/or transfers;A lot of help with bathing/dressing/bathroom;Help with stairs or ramp for entrance;Assist for transportation   Can travel by private vehicle     No  Equipment Recommendations  Other (comment) (TBD)    Recommendations for Other Services       Precautions / Restrictions Precautions Precautions: Fall Restrictions Weight Bearing Restrictions: Yes LLE Weight Bearing: Partial weight bearing LLE Partial Weight Bearing Percentage or Pounds: 50%     Mobility  Bed Mobility Overal bed mobility: Needs Assistance Bed Mobility: Supine to Sit     Supine to sit: HOB elevated, Min assist, Used rails     General bed mobility comments: Min A for LLE management    Transfers Overall transfer level: Needs assistance Equipment used: Rolling walker (2 wheels) Transfers: Sit to/from Stand Sit to Stand: Contact guard assist Stand pivot transfers: Contact  guard assist         General transfer comment: CGA for STS and step pivot on the RLE with RW to recliner; LLE WBing precautions maintained; limited 2/2 L hip pain and SOB (required inhaler post transfers)    Ambulation/Gait               General Gait Details: unable to tolerate 2/2 pain and SOB   Stairs             Wheelchair Mobility     Tilt Bed    Modified Rankin (Stroke Patients Only)       Balance Overall balance assessment: Needs assistance Sitting-balance support: Feet supported, Bilateral upper extremity supported Sitting balance-Leahy Scale: Good     Standing balance support: Bilateral upper extremity supported, During functional activity, Reliant on assistive device for balance Standing balance-Leahy Scale: Poor Standing balance comment: increased reliance on RW with standing; decreased weight shift onto LLE                            Cognition Arousal: Alert Behavior During Therapy: WFL for tasks assessed/performed, Anxious Overall Cognitive Status: Within Functional Limits for tasks assessed                                          Exercises      General Comments General comments (skin integrity, edema, etc.): 3L O2 via [place] with all mobility  Pertinent Vitals/Pain Pain Assessment Pain Assessment: 0-10 Pain Score: 6  Pain Location: L hip at rest Pain Descriptors / Indicators: Aching, Grimacing, Guarding, Moaning Pain Intervention(s): Limited activity within patient's tolerance, Monitored during session, Premedicated before session, Repositioned    Home Living                          Prior Function            PT Goals (current goals can now be found in the care plan section) Acute Rehab PT Goals Patient Stated Goal: get rid of this pain PT Goal Formulation: With patient Time For Goal Achievement: 05/11/23 Potential to Achieve Goals: Good Progress towards PT goals: Progressing toward  goals    Frequency    7X/week      PT Plan      Co-evaluation              AM-PAC PT "6 Clicks" Mobility   Outcome Measure  Help needed turning from your back to your side while in a flat bed without using bedrails?: A Little Help needed moving from lying on your back to sitting on the side of a flat bed without using bedrails?: A Little Help needed moving to and from a bed to a chair (including a wheelchair)?: A Lot Help needed standing up from a chair using your arms (e.g., wheelchair or bedside chair)?: A Lot Help needed to walk in hospital room?: Total Help needed climbing 3-5 steps with a railing? : Total 6 Click Score: 12    End of Session Equipment Utilized During Treatment: Gait belt;Oxygen Activity Tolerance: Patient limited by pain (limited by SOB) Patient left: in chair;with call bell/phone within reach;with chair alarm set (O2 decreased to 2L) Nurse Communication: Mobility status (pain status) PT Visit Diagnosis: Other abnormalities of gait and mobility (R26.89);Difficulty in walking, not elsewhere classified (R26.2);History of falling (Z91.81);Muscle weakness (generalized) (M62.81);Unsteadiness on feet (R26.81);Pain Pain - Right/Left: Left Pain - part of body: Hip     Time: 0272-5366 PT Time Calculation (min) (ACUTE ONLY): 18 min  Charges:    $$ACUTE PT VISIT: 1 Visit $Therapeutic Activity: 8-22 mins                      Shauna Hugh, SPT 04/30/2023, 11:22 AM

## 2023-04-30 NOTE — Plan of Care (Signed)
  Problem: Education: Goal: Knowledge of General Education information will improve Description Including pain rating scale, medication(s)/side effects and non-pharmacologic comfort measures Outcome: Progressing   Problem: Activity: Goal: Risk for activity intolerance will decrease Outcome: Progressing   Problem: Nutrition: Goal: Adequate nutrition will be maintained Outcome: Progressing   Problem: Elimination: Goal: Will not experience complications related to urinary retention Outcome: Progressing   Problem: Safety: Goal: Ability to remain free from injury will improve Outcome: Progressing   Problem: Skin Integrity: Goal: Risk for impaired skin integrity will decrease Outcome: Progressing

## 2023-04-30 NOTE — Assessment & Plan Note (Addendum)
On Protonix.  Can go back to omeprazole as outpatient.

## 2023-04-30 NOTE — Assessment & Plan Note (Signed)
Will give Lokelma twice today and recheck potassium tomorrow.

## 2023-04-30 NOTE — Plan of Care (Signed)

## 2023-04-30 NOTE — Assessment & Plan Note (Addendum)
Pain control.  I explained to the patient the quicker we can get over to Tylenol, the better.  Pain pills prescribed.  14 days of Lovenox prescribed.

## 2023-04-30 NOTE — Assessment & Plan Note (Signed)
Follow up as outpatient.  

## 2023-04-30 NOTE — Consult Note (Signed)
Pulmonary Medicine          Date: 04/30/2023,   MRN# [id number] Regina Bernard 1955-11-09     AdmissionWeight: 59 kg                 CurrentWeight: 59.2 kg      CHIEF COMPLAINT:   Copd exacerbation   HISTORY OF PRESENT ILLNESS   This is a 67 year old lady well known to our service. An ex  smoker, known hx of moderate copd.phx of right breast cancer with axilla positive nodes, s/p chemo/xrt, oncology following  Came in with left femur fracture, s/p  INTRAMEDULLARY (IM) NAIL INTERTROCHANTERIC (Left) dat # four.   While here having persistent wheezing. Typically she always have residual wheezing. No associated pleurisy, frank chest pain, leg pain, or swelling. On dvt prophylaxis. Hx of Pseudomonas aeruginosa  Colonization. Sensitive quinilones. Chest showed cavitary lesion is stable.     PAST MEDICAL HISTORY   Past Medical History:  Diagnosis Date   Abnormal CT scan, chest    Anxiety    Asthma    Breast cancer (HCC)    Breast CA- Right    Breast cancer (HCC) 06/18/2011   right breast cancer   COPD (chronic obstructive pulmonary disease) (HCC)    Endometriosis    tx with vaginal hysterectomy   Fatty liver    Hepatitis C 1998   [facility] trial treatment in-patient study 2005. HCV 740, 05/20/11.   History of substance abuse (HCC)    Heroine, cocaine, marijuana. States all of them. Heavy use 1995 to 2000. Occasional use before and after. None since 2006.    Hot flashes related to aromatase inhibitor therapy    Hypertension    Lung mass    Peripheral neuropathy    in hands and feet     SURGICAL HISTORY   Past Surgical History:  Procedure Laterality Date   BREAST LUMPECTOMY Right 2013   COLONOSCOPY WITH PROPOFOL N/A 03/20/2022   Procedure: COLONOSCOPY WITH PROPOFOL;  Surgeon: Toney Reil, MD;  Location[facility] ENDOSCOPY;  Service: Gastroenterology;  Laterality: N/A;   ESOPHAGOGASTRODUODENOSCOPY (EGD) WITH PROPOFOL N/A 10/29/2021   Procedure:  ESOPHAGOGASTRODUODENOSCOPY (EGD) WITH PROPOFOL;  Surgeon: Toney Reil, MD;  Location: [facility] ENDOSCOPY;  Service: Gastroenterology;  Laterality: N/A;   ESOPHAGOGASTRODUODENOSCOPY (EGD) WITH PROPOFOL N/A 02/14/2022   Procedure: ESOPHAGOGASTRODUODENOSCOPY (EGD) WITH PROPOFOL;  Surgeon: Toney Reil, MD;  Location: [facility] ENDOSCOPY;  Service: Gastroenterology;  Laterality: N/A;  DRIVER 5 - 10 MINUTES AWAY   INTRAMEDULLARY (IM) NAIL INTERTROCHANTERIC Left 04/26/2023   Procedure: INTRAMEDULLARY (IM) NAIL INTERTROCHANTERIC;  Surgeon: Deeann Saint, MD;  Location: [facility] ORS;  Service: Orthopedics;  Laterality: Left;   TUBAL LIGATION  1996   Upper right leg benign tumor removed     VAGINAL HYSTERECTOMY  2001     FAMILY HISTORY   Family History  Problem Relation Age of Onset   Diabetes Maternal Grandmother    Lung cancer Father    Lung cancer Mother    Hypertension Mother    Heart disease Mother    Skin cancer Mother    Breast cancer Neg Hx      SOCIAL HISTORY   Social History   Tobacco Use   Smoking status: Every Day    Types: E-cigarettes   Smokeless tobacco: Never   Tobacco comments:    last cigeratte use 6 days ago (11/30/15)  Vaping Use   Vaping status: Every Day  Substance Use Topics  Alcohol use: Yes    Comment: occ   Drug use: No     MEDICATIONS    Home Medication:    Current Medication:  Current Facility-Administered Medications:    acetaminophen (TYLENOL) tablet 650 mg, 650 mg, Oral, Q6H PRN, Deeann Saint, MD   albuterol (PROVENTIL) (2.5 MG/3ML) 0.083% nebulizer solution 2.5 mg, 2.5 mg, Nebulization, Q4H PRN, Deeann Saint, MD, 2.5 mg at 04/26/23 0918   alum & mag hydroxide-simeth (MAALOX/MYLANTA) 200-200-20 MG/5ML suspension 30 mL, 30 mL, Oral, Q4H PRN, Deeann Saint, MD   bisacodyl (DULCOLAX) suppository 10 mg, 10 mg, Rectal, Daily PRN, Deeann Saint, MD   cyanocobalamin (VITAMIN B12) tablet 1,000 mcg, 1,000 mcg, Oral, Daily, Marrion Coy, MD,  1,000 mcg at 04/30/23 1011   dextromethorphan-guaiFENesin (MUCINEX DM) 30-600 MG per 12 hr tablet 1 tablet, 1 tablet, Oral, BID PRN, Deeann Saint, MD   DULoxetine (CYMBALTA) DR capsule 30 mg, 30 mg, Oral, BID, Deeann Saint, MD, 30 mg at 04/30/23 1012   enoxaparin (LOVENOX) injection 30 mg, 30 mg, Subcutaneous, Q24H, Deeann Saint, MD, 30 mg at 04/30/23 1011   feeding supplement (ENSURE ENLIVE / ENSURE PLUS) liquid 237 mL, 237 mL, Oral, BID BM, Deeann Saint, MD, 237 mL at 04/30/23 1437   ferrous sulfate tablet 325 mg, 325 mg, Oral, Q breakfast, Deeann Saint, MD, 325 mg at 04/30/23 1011   fluticasone furoate-vilanterol (BREO ELLIPTA) 100-25 MCG/ACT 1 puff, 1 puff, Inhalation, Daily, 1 puff at 04/30/23 1233 **AND** umeclidinium bromide (INCRUSE ELLIPTA) 62.5 MCG/ACT 1 puff, 1 puff, Inhalation, Daily, Wieting, Richard, MD, 1 puff at 04/30/23 1233   gabapentin (NEURONTIN) capsule 300 mg, 300 mg, Oral, TID, Deeann Saint, MD, 300 mg at 04/30/23 1012   ipratropium-albuterol (DUONEB) 0.5-2.5 (3) MG/3ML nebulizer solution 3 mL, 3 mL, Nebulization, Q6H, Marrion Coy, MD, 3 mL at 04/30/23 1400   lamoTRIgine (LAMICTAL) tablet 100 mg, 100 mg, Oral, Daily, Deeann Saint, MD, 100 mg at 04/30/23 1012   lidocaine (LIDODERM) 5 % 1 patch, 1 patch, Transdermal, Q24H, Deeann Saint, MD, 1 patch at 04/29/23 1938   loperamide (IMODIUM) capsule 2 mg, 2 mg, Oral, PRN, Deeann Saint, MD   menthol-cetylpyridinium (CEPACOL) lozenge 3 mg, 1 lozenge, Oral, PRN **OR** phenol (CHLORASEPTIC) mouth spray 1 spray, 1 spray, Mouth/Throat, PRN, Deeann Saint, MD   methocarbamol (ROBAXIN) tablet 500 mg, 500 mg, Oral, Q8H PRN, Deeann Saint, MD   methylPREDNISolone sodium succinate (SOLU-MEDROL) 40 mg/mL injection 40 mg, 40 mg, Intravenous, Daily, Renae Gloss, Richard, MD, 40 mg at 04/30/23 1040   metoCLOPramide (REGLAN) tablet 5-10 mg, 5-10 mg, Oral, Q8H PRN **OR** metoCLOPramide (REGLAN) injection 5-10 mg, 5-10 mg, Intravenous,  Q8H PRN, Deeann Saint, MD   metoprolol succinate (TOPROL-XL) 24 hr tablet 100 mg, 100 mg, Oral, Daily, Deeann Saint, MD, 100 mg at 04/29/23 0093   multivitamin with minerals tablet 1 tablet, 1 tablet, Oral, Daily, Deeann Saint, MD, 1 tablet at 04/30/23 1012   nicotine (NICODERM CQ - dosed in mg/24 hours) patch 21 mg, 21 mg, Transdermal, Daily, Deeann Saint, MD, 21 mg at 04/30/23 1017   nitroGLYCERIN (NITROSTAT) SL tablet 0.4 mg, 0.4 mg, Sublingual, Q5 min PRN, Deeann Saint, MD   ondansetron [facility]) injection 4 mg, 4 mg, Intravenous, Q8H PRN, Deeann Saint, MD   oxyCODONE-acetaminophen (PERCOCET/ROXICET) 5-325 MG per tablet 1-2 tablet, 1-2 tablet, Oral, Q4H PRN, Marrion Coy, MD, 1 tablet at 04/30/23 1232   pantoprazole (PROTONIX) EC tablet 40 mg, 40 mg, Oral, Daily, Deeann Saint, MD, 40 mg at 04/30/23 1012  QUEtiapine (SEROQUEL) tablet 25 mg, 25 mg, Oral, QHS PRN, Deeann Saint, MD   senna [facility]) tablet 8.6 mg, 1 tablet, Oral, BID, Deeann Saint, MD, 8.6 mg at 04/30/23 1011   senna-docusate (Senokot-S) tablet 1 tablet, 1 tablet, Oral, QHS PRN, Deeann Saint, MD   sodium chloride flush (NS) 0.9 % injection 3 mL, 3 mL, Intravenous, Q12H, Deeann Saint, MD, 3 mL at 04/30/23 1017   sodium phosphate (FLEET) enema 1 enema, 1 enema, Rectal, Once PRN, Deeann Saint, MD   sodium zirconium cyclosilicate (LOKELMA) packet 10 g, 10 g, Oral, Once, Wieting, Richard, MD    ALLERGIES   Penicillins     REVIEW OF SYSTEMS    Review of Systems:  Gen:  Denies  fever, sweats, chills weigh loss  HEENT: Denies blurred vision, double vision, ear pain, eye pain, hearing loss, nose bleeds, sore throat Cardiac:  No dizziness, chest pain or heaviness, chest tightness,edema Resp:   + cough, + shortness of breath, +wheezing, - hemoptysis,  Gi: Denies swallowing difficulty, stomach pain, nausea or vomiting, diarrhea, constipation, bowel incontinence Gu:  Denies bladder incontinence, burning  urine Ext:   Denies Joint pain, stiffness or swelling Skin: Denies  skin rash, easy bruising or bleeding or hives Endoc:  Denies polyuria, polydipsia , polyphagia or weight change Psych:   Denies depression, insomnia or hallucinations   Other:  All other systems negative   VS: BP 128/85 (BP Location: Left Arm)   Pulse 93   Temp 97.9 F (36.6 C)   Resp 17   Wt 59.2 kg   SpO2 100%   BMI 21.07 kg/m      PHYSICAL EXAM    GENERAL:NAD, no fevers, chills, no weakness no fatigue, speaking in full sentences HEAD: Normocephalic, atraumatic.  EYES: Pupils equal, round, reactive to light. Extraocular muscles intact. No scleral icterus.  MOUTH: Moist mucosal membrane. Dentition intact. No abscess noted.  EAR, NOSE, THROAT: Clear without exudates. No external lesions.  NECK: Supple. No thyromegaly. No nodules. No JVD.  PULMONARY: Diffuse coarse rhonchi right sided +wheezes left greater than right CARDIOVASCULAR: S1 and S2. Regular rate and rhythm. No murmurs, rubs, or gallops. No edema. Pedal pulses 2+ bilaterally.  GASTROINTESTINAL: Soft, nontender, nondistended. No masses. Positive bowel sounds. No hepatosplenomegaly.  MUSCULOSKELETAL: No swelling, clubbing, or edema. Range of motion full in all extremities.  NEUROLOGIC: Cranial nerves II through XII are intact. No gross focal neurological deficits. Sensation intact. Reflexes intact.  SKIN: No ulceration, lesions, rashes, or cyanosis. Skin warm and dry. Turgor intact.  PSYCHIATRIC: Mood, affect within normal limits. The patient is awake, alert and oriented x 3. Insight, judgment intact.       IMAGING    DG HIP UNILAT WITH PELVIS 2-3 VIEWS LEFT  Result Date: 04/26/2023 CLINICAL DATA:  Left femoral nail EXAM: DG HIP (WITH OR WITHOUT PELVIS) 2V LEFT COMPARISON:  Hip radiograph dated April 25, 2023 FINDINGS: Fluoroscopic images were obtained intraoperatively and submitted for post operative interpretation. Left femoral intramedullary  nail placement with hardware in expected position, 5 images were obtained with 39 seconds of fluoroscopy time and 6.61 mGy. Please see the performing provider's procedural report for further detail. IMPRESSION: Fluoroscopic images were obtained intraoperatively for left femoral intramedullary nail placement. Electronically Signed   By: Allegra Lai M.D.   On: 04/26/2023 14:32   DG C-Arm 1-60 Min-No Report  Result Date: 04/26/2023 Fluoroscopy was utilized by the requesting physician.  No radiographic interpretation.   CT Angio Chest Pulmonary Embolism (PE)  W or WO Contrast  Result Date: 04/25/2023 CLINICAL DATA:  Recent fall with left-sided chest pain, initial encounter EXAM: CT ANGIOGRAPHY CHEST WITH CONTRAST TECHNIQUE: Multidetector CT imaging of the chest was performed using the standard protocol during bolus administration of intravenous contrast. Multiplanar CT image reconstructions and MIPs were obtained to evaluate the vascular anatomy. RADIATION DOSE REDUCTION: This exam was performed according to the departmental dose-optimization program which includes automated exposure control, adjustment of the mA and/or kV according to patient size and/or use of iterative reconstruction technique. CONTRAST:  75mL OMNIPAQUE IOHEXOL 350 MG/ML SOLN COMPARISON:  Chest x-ray from earlier the same day, 10/02/2022. FINDINGS: Cardiovascular: Atherosclerotic calcifications of the thoracic aorta are noted. No aneurysmal dilatation is seen. No cardiac enlargement is noted. The pulmonary artery is well visualized within normal branching pattern bilaterally. No filling defect to suggest pulmonary embolism is noted. Mediastinum/Nodes: Thoracic inlet is within normal limits. The esophagus as visualized is unremarkable. No hilar or mediastinal adenopathy is noted. Scattered calcifications are noted in the hilar nodes likely related to prior granulomatous disease. Lungs/Pleura: Emphysematous changes are seen similar to  that noted on the prior exam. Cavitary lesions are seen in the left upper lobe and lingula stable from the prior exam. No parenchymal nodule is seen. No effusion or focal infiltrate is seen. Upper Abdomen: No acute abnormality. Musculoskeletal: Degenerative changes of the thoracic spine are noted. No acute rib abnormality is noted. Scoliosis concave to the left is noted in the thoracic spine. Review of the MIP images confirms the above findings. IMPRESSION: No evidence of pulmonary emboli. Chronic emphysematous changes with cavitary lesion stable from the prior exam. No acute rib abnormality is noted. Aortic Atherosclerosis (ICD10-I70.0) and Emphysema (ICD10-J43.9). Electronically Signed   By: Alcide Clever M.D.   On: 04/25/2023 23:32   DG Knee Complete 4 Views Left  Result Date: 04/25/2023 CLINICAL DATA:  Fall on left side, left hip pain. EXAM: LEFT KNEE - COMPLETE 4+ VIEW COMPARISON:  None Available. FINDINGS: No evidence of fracture, dislocation, or joint effusion. The alignment and joint spaces are preserved. There is chondrocalcinosis. No evidence of arthropathy or other focal bone abnormality. Soft tissues are unremarkable. IMPRESSION: 1. No fracture or dislocation of the left knee. 2. Chondrocalcinosis. Electronically Signed   By: Narda Rutherford M.D.   On: 04/25/2023 18:54   DG Chest Port 1 View  Result Date: 04/25/2023 CLINICAL DATA:  Shortness of breath.  Fall on left side. EXAM: PORTABLE CHEST 1 VIEW COMPARISON:  Radiograph 07/15/2022, CT 10/02/2022 FINDINGS: Emphysema with chronic left perihilar scarring. Previous cavitary lesion in the left lung on prior CT is less well-defined on the current exam. Chronic subpleural reticular markings at the right lung base. There is no acute airspace disease, pleural effusion, or pneumothorax. The heart is normal in size. Prominent thoracic scoliosis. IMPRESSION: 1. No acute abnormality. 2. Chronic lung disease. Emphysema with chronic left perihilar  scarring. Electronically Signed   By: Narda Rutherford M.D.   On: 04/25/2023 18:54   DG Hip Unilat W or Wo Pelvis 2-3 Views Left  Result Date: 04/25/2023 CLINICAL DATA:  Fall with left hip pain. EXAM: DG HIP (WITH OR WITHOUT PELVIS) 2-3V LEFT COMPARISON:  None Available. FINDINGS: Displaced intertrochanteric left proximal femur fracture. The femoral head is well seated, no hip dislocation. Intact bony pelvis, including pubic rami. No pubic symphyseal or sacroiliac diastasis. IMPRESSION: Displaced intertrochanteric left proximal femur fracture. Electronically Signed   By: Narda Rutherford M.D.   On: 04/25/2023 18:51  ASSESSMENT/PLAN   COPD, stage II-III,  ex smoker, severe scoliosis, acute on chronic dyspnea, with bronchospasm. Copd exacerbation. On broad coverage. She hardly ever wheeze free at baseline. Her anxiety is also a contributor.  Phx of pseudomonas colonization.  -Continue trelegy and albuterol  -increase fio2 to 3 liters with ambulation -mucinex, flutter valve, incentive spirometry -short course of solumederol as ordered -dvt prophylaxis ( anticoagution, following h/h vs mechanical prophylaxis).  -physical therapy in progress.  -out patient f/u on the chest ct changesn ( cavitary lesions, etc)    Thank you for allowing me to participate in the care of this patient.   Patient/Family are satisfied with care plan and all questions have been answered.  This document was prepared using Dragon voice recognition software and may include unintentional dictation errors.     Ned Clines, M.D.  Division of Pulmonary & Critical Care Medicine  [facility]

## 2023-04-30 NOTE — Progress Notes (Signed)
Progress Note   Patient: Regina Bernard:[id number] DOB: [birth year] DOA: 04/25/2023     5 DOS: the patient was seen and examined on 04/30/2023   Brief hospital course: Regina Bernard is a 67 y.o. female with medical history significant of COPD on 2L O2, HTN, GI bleeding, PUD, depression with anxiety, polysubstance abuse, chronic pain, liver cirrhosis, breast cancer, who presents with shortness of breath, chest pain, fall and left hip pain.  INTERTROCHANTERIC left hip nail performed on 10/19.  10/23.  Patient does complain of some shortness of breath and wheezing.  She states she always wheezes.  She has not had her inhaler.  Starting Solu-Medrol.  Assessment and Plan: * Hyperkalemia Will give Lokelma twice today and recheck potassium tomorrow.  COPD with acute exacerbation (HCC) Switch prednisone back to Solu-Medrol.  Patient has not been on her Trelegy inhaler since she is coming in.  Will restart Trelegy substitute.  Continue nebulizer treatments.  Patient requested pulmonary consultation.  Case discussed with Dr. Meredeth Ide.  Closed left hip fracture (HCC) Pain control.  I explained to the patient the quicker we can get over to Tylenol open better.  ABLA (acute blood loss anemia) Likely postoperative anemia.  Hemoglobin drifted down to 7.9.  If drops any further may end up needing a blood transfusion.  Liver cirrhosis (HCC) Follow-up as outpatient  AKI (acute kidney injury) (HCC) Creatinine 1.78 on 10/20.  Creatinine 1.1 today.  PUD (peptic ulcer disease) On Protonix  Essential hypertension On Toprol  Depression with anxiety On Lamictal, Cymbalta and gabapentin  Protein-calorie malnutrition, moderate (HCC) Continue supplements        Subjective: Patient complains of a little shortness of breath and will cough.  Potassium high again today at 5.5.  Patient complains of hip pain.  Had procedure on the 19th.  Physical Exam: Vitals:   04/30/23 0802 04/30/23 0821  04/30/23 1007 04/30/23 1402  BP: (!) 173/82  106/76   Pulse: 66 68 86 85  Resp: 17 18  18   Temp: 97.8 F (36.6 C)     TempSrc:      SpO2: 100% 98% 100% 98%  Weight:       Physical Exam HENT:     Head: Normocephalic.     Mouth/Throat:     Pharynx: No oropharyngeal exudate.  Eyes:     General: Lids are normal.     Conjunctiva/sclera: Conjunctivae normal.  Cardiovascular:     Rate and Rhythm: Normal rate and regular rhythm.     Heart sounds: Normal heart sounds, S1 normal and S2 normal.  Pulmonary:     Breath sounds: Examination of the right-middle field reveals wheezing. Examination of the left-middle field reveals wheezing. Examination of the right-lower field reveals decreased breath sounds and wheezing. Examination of the left-lower field reveals decreased breath sounds and wheezing. Decreased breath sounds and wheezing present. No rhonchi or rales.  Abdominal:     Palpations: Abdomen is soft.     Tenderness: There is no abdominal tenderness.  Musculoskeletal:     Right lower leg: No swelling.     Left lower leg: No swelling.  Skin:    General: Skin is warm.     Findings: No rash.  Neurological:     Mental Status: She is alert and oriented to person, place, and time.     Comments: Unable to straight leg raise very well with her left leg.  Able to straight leg raise with the right leg.  Data Reviewed: Sodium 132, potassium 5.5, creatinine 1.1, hemoglobin 7.9, MCV 101.7, white blood cell count 8.5, platelet count 230  Family Communication: Left message for husband  Disposition: Status is: Inpatient Remains inpatient appropriate because: Treating hyperkalemia with Lokelma and COPD exacerbation with IV Solu-Medrol  Planned Discharge Destination: Rehab    Time spent: 28 minutes  Author: Alford Highland, MD 04/30/2023 2:28 PM  For on call review [email or web address].

## 2023-04-30 NOTE — Assessment & Plan Note (Addendum)
Hemoglobin up to 10.2 after transfusion yesterday.  Continue oral iron.  Continue B12 supplementation.

## 2023-04-30 NOTE — Assessment & Plan Note (Addendum)
Continue Solu-Medrol this morning and switch over to prednisone taper for tomorrow.  Continue Trelegy.  Continue as needed nebulizer treatments.  Patient received flu and pneumococcal vaccination.  History of Pseudomonas colonization.  The patient did not receive treatment for the Pseudomonas in the sputum culture.  No pneumonia on previous chest x-ray.

## 2023-04-30 NOTE — Assessment & Plan Note (Addendum)
Creatinine 1.78 on 10/20.  Creatinine 1.05 today.

## 2023-04-30 NOTE — Assessment & Plan Note (Signed)
On Toprol 

## 2023-04-30 NOTE — Assessment & Plan Note (Signed)
On Lamictal, Cymbalta and gabapentin

## 2023-04-30 NOTE — Progress Notes (Signed)
Occupational Therapy Treatment Patient Details Name: Regina Bernard MRN: [id number] DOB: [birth year] Today's Date: 04/30/2023   History of present illness Pt is a 67 year old female presenting with presents with shortness of breath, chest pain, fall and left hip pain; L IMN performed on 10/19    PMH significant for COPD on 2L O2, HTN, GI bleeding, PUD, depression with anxiety, polysubstance abuse, chronic pain, liver cirrhosis, breast cancer   OT comments  Pt seen for OT treatment on this date. Upon arrival to room pt supine in bed, agreeable to tx. Pt completed supine<>sit t/f with Min A for LLE management. Pt completed sit<>stand t/f with CGA +RW. Pt took breaks in between as needed. Pt completed a stand pivot to [facility] with CGA +RW. Pt completed perineal hygiene while seated with supervision.   Pt completed oral care and hand hygiene with supervision and set up while seated. Pt educated and cued of PWB 50% during t/f's. Pt reported feeling pain in LLE. Pt's pain was monitored during the session. Pt returned to bed and left supine with call bell within reach and bed alarm. Pt making good progress toward goals, will continue to follow POC. Discharge recommendation remains appropriate.        If plan is discharge home, recommend the following:  A lot of help with walking and/or transfers;A lot of help with bathing/dressing/bathroom;Assist for transportation;Help with stairs or ramp for entrance;Assistance with cooking/housework   Equipment Recommendations  Other (comment) (Defer to next venue of care)    Recommendations for Other Services      Precautions / Restrictions Precautions Precautions: Fall Restrictions Weight Bearing Restrictions: Yes LLE Weight Bearing: Partial weight bearing LLE Partial Weight Bearing Percentage or Pounds: 50%       Mobility Bed Mobility Overal bed mobility: Needs Assistance Bed Mobility: Supine to Sit     Supine to sit: HOB elevated, Min assist, Used  rails Sit to supine: HOB elevated, Min assist   General bed mobility comments: Min A for LLE management    Transfers Overall transfer level: Needs assistance Equipment used: Rolling walker (2 wheels) Transfers: Sit to/from Stand Sit to Stand: Contact guard assist Stand pivot transfers: Contact guard assist               Balance Overall balance assessment: Needs assistance Sitting-balance support: Feet supported, Bilateral upper extremity supported Sitting balance-Leahy Scale: Good     Standing balance support: Bilateral upper extremity supported, During functional activity, Reliant on assistive device for balance Standing balance-Leahy Scale: Poor                             ADL either performed or assessed with clinical judgement   ADL Overall ADL's : Needs assistance/impaired                                     Functional mobility during ADLs: Supervision/safety;Set up;Rolling walker (2 wheels);Contact guard assist General ADL Comments: Pt completed toilet t/f with a stand pivot with CGA +RW. Pt completed oral care and hand hyiene with supervision and set up.    Extremity/Trunk Assessment Upper Extremity Assessment Upper Extremity Assessment: Generalized weakness   Lower Extremity Assessment Lower Extremity Assessment: LLE deficits/detail RLE Coordination: decreased gross motor        Vision Patient Visual Report: No change from baseline     Perception  Praxis      Cognition Arousal: Alert Behavior During Therapy: WFL for tasks assessed/performed, Agitated Overall Cognitive Status: Within Functional Limits for tasks assessed                                          Exercises      Shoulder Instructions       General Comments 2L O2 via [place]    Pertinent Vitals/ Pain       Pain Assessment Pain Assessment: Faces Faces Pain Scale: Hurts whole lot Pain Location: L hip Pain Descriptors / Indicators:  Aching, Grimacing, Guarding, Moaning Pain Intervention(s): Limited activity within patient's tolerance, Monitored during session  Home Living                                          Prior Functioning/Environment              Frequency  Min 1X/week        Progress Toward Goals  OT Goals(current goals can now be found in the care plan section)  Progress towards OT goals: Progressing toward goals  Acute Rehab OT Goals Patient Stated Goal: improve strength and decrease pain. OT Goal Formulation: With patient Time For Goal Achievement: 05/11/23 Potential to Achieve Goals: Good  Plan      Co-evaluation                 AM-PAC OT "6 Clicks" Daily Activity     Outcome Measure   Help from another person eating meals?: None Help from another person taking care of personal grooming?: None Help from another person toileting, which includes using toliet, bedpan, or urinal?: A Lot Help from another person bathing (including washing, rinsing, drying)?: A Lot Help from another person to put on and taking off regular upper body clothing?: A Little Help from another person to put on and taking off regular lower body clothing?: A Lot 6 Click Score: 17    End of Session Equipment Utilized During Treatment: Rolling walker (2 wheels)  OT Visit Diagnosis: Other abnormalities of gait and mobility (R26.89);Muscle weakness (generalized) (M62.81);Unsteadiness on feet (R26.81)   Activity Tolerance Patient limited by pain;Patient tolerated treatment well   Patient Left in bed;with call bell/phone within reach;with bed alarm set   Nurse Communication Mobility status        Time: 1610-9604 OT Time Calculation (min): 20 min  Charges:    Butch Penny, SOT

## 2023-05-01 DIAGNOSIS — E875 Hyperkalemia: Secondary | ICD-10-CM | POA: Diagnosis not present

## 2023-05-01 DIAGNOSIS — J9611 Chronic respiratory failure with hypoxia: Secondary | ICD-10-CM

## 2023-05-01 DIAGNOSIS — S72002S Fracture of unspecified part of neck of left femur, sequela: Secondary | ICD-10-CM | POA: Diagnosis not present

## 2023-05-01 DIAGNOSIS — D62 Acute posthemorrhagic anemia: Secondary | ICD-10-CM | POA: Diagnosis not present

## 2023-05-01 DIAGNOSIS — J441 Chronic obstructive pulmonary disease with (acute) exacerbation: Secondary | ICD-10-CM | POA: Diagnosis not present

## 2023-05-01 HISTORY — DX: Chronic respiratory failure with hypoxia: J96.11

## 2023-05-01 LAB — CBC
HCT: 23.7 % — ABNORMAL LOW (ref 36.0–46.0)
Hemoglobin: 7.8 g/dL — ABNORMAL LOW (ref 12.0–15.0)
MCH: 33.1 pg (ref 26.0–34.0)
MCHC: 32.9 g/dL (ref 30.0–36.0)
MCV: 100.4 fL — ABNORMAL HIGH (ref 80.0–100.0)
Platelets: 274 10*3/uL (ref 150–400)
RBC: 2.36 MIL/uL — ABNORMAL LOW (ref 3.87–5.11)
RDW: 13.2 % (ref 11.5–15.5)
WBC: 9.8 10*3/uL (ref 4.0–10.5)
nRBC: 0 % (ref 0.0–0.2)

## 2023-05-01 LAB — BASIC METABOLIC PANEL
Anion gap: 7 (ref 5–15)
BUN: 41 mg/dL — ABNORMAL HIGH (ref 8–23)
CO2: 29 mmol/L (ref 22–32)
Calcium: 8.6 mg/dL — ABNORMAL LOW (ref 8.9–10.3)
Chloride: 95 mmol/L — ABNORMAL LOW (ref 98–111)
Creatinine, Ser: 1.1 mg/dL — ABNORMAL HIGH (ref 0.44–1.00)
GFR, Estimated: 55 mL/min — ABNORMAL LOW (ref >60)
Glucose, Bld: 92 mg/dL (ref 70–99)
Potassium: 4.2 mmol/L (ref 3.5–5.1)
Sodium: 131 mmol/L — ABNORMAL LOW (ref 135–145)

## 2023-05-01 LAB — PREPARE RBC (CROSSMATCH)

## 2023-05-01 MED ORDER — SODIUM CHLORIDE 0.9% IV SOLUTION: Freq: Once | INTRAVENOUS | Status: AC

## 2023-05-01 MED ORDER — INFLUENZA VAC A&B SURF ANT ADJ 0.5 ML IM SUSY
0.5000 mL | PREFILLED_SYRINGE | INTRAMUSCULAR | Status: AC
  Administered 2023-05-02: 0.5 mL via INTRAMUSCULAR
  Filled 2023-05-01: qty 0.5

## 2023-05-01 MED ORDER — PNEUMOCOCCAL 20-VAL CONJ VACC 0.5 ML IM SUSY
0.5000 mL | PREFILLED_SYRINGE | Freq: Once | INTRAMUSCULAR | Status: AC
  Administered 2023-05-01: 0.5 mL via INTRAMUSCULAR
  Filled 2023-05-01: qty 0.5

## 2023-05-01 MED ORDER — FUROSEMIDE 10 MG/ML IJ SOLN
20.0000 mg | Freq: Once | INTRAMUSCULAR | Status: AC
  Administered 2023-05-01: 20 mg via INTRAVENOUS
  Filled 2023-05-01: qty 4

## 2023-05-01 NOTE — TOC Progression Note (Signed)
Transition of Care [facility]) - Progression Note    Patient Details  Name: Regina Bernard MRN: [id number] Date of Birth: [birth year]  Transition of Care [facility]) CM/SW Contact  Hetty Ely, RN Phone Number: 05/01/2023, 3:38 PM  Clinical Narrative: Spoke with patient about bed offers, she's receptive to [facility]. Tammy called, confirm bed offer stands. CM to start Authorization process.      Expected Discharge Plan: Skilled Nursing Facility Barriers to Discharge: SNF Pending bed offer, Insurance Authorization  Expected Discharge Plan and Services       Living arrangements for the past 2 months: Single Family Home                                       Social Determinants of Health (SDOH) Interventions SDOH Screenings   Food Insecurity: No Food Insecurity (05/01/2023)  Housing: Patient Declined (05/01/2023)  Transportation Needs: No Transportation Needs (05/01/2023)  Utilities: Not At Risk (05/01/2023)  Tobacco Use: High Risk (04/25/2023)    Readmission Risk Interventions     No data to display

## 2023-05-01 NOTE — Progress Notes (Signed)
Physical Therapy Treatment Patient Details Name: Regina Bernard MRN: [id number] DOB: 04-12-1956 Today's Date: 05/01/2023   History of Present Illness Pt is a 67 year old female presenting with presents with shortness of breath, chest pain, fall and left hip pain; L IMN performed on 10/19    PMH significant for COPD on 2L O2, HTN, GI bleeding, PUD, depression with anxiety, polysubstance abuse, chronic pain, liver cirrhosis, breast cancer    PT Comments  Pt with improved tolerance to activity today, noted for less SOB, anxiety, and pain (5/10 L hip pain). Supine to sit with CGA, bed rails and extended time, fair sitting balance, sit <> Stand and step pivot with RW and CGA, cued for PWB. After a seated rest break she was able to ambulate ~27ft in room, pt self limiting on distance due to fatigue and pain. Up in recliner with needs in reach. The patient would benefit from further skilled PT intervention to continue to progress towards goals.      If plan is discharge home, recommend the following: A lot of help with walking and/or transfers;A lot of help with bathing/dressing/bathroom;Help with stairs or ramp for entrance;Assist for transportation   Can travel by private vehicle     No  Equipment Recommendations  Other (comment) (TBD)    Recommendations for Other Services       Precautions / Restrictions Restrictions Weight Bearing Restrictions: Yes LLE Weight Bearing: Partial weight bearing LLE Partial Weight Bearing Percentage or Pounds: 50%     Mobility  Bed Mobility Overal bed mobility: Needs Assistance Bed Mobility: Supine to Sit     Supine to sit: HOB elevated, Used rails, Contact guard          Transfers Overall transfer level: Needs assistance Equipment used: Rolling walker (2 wheels) Transfers: Sit to/from Stand Sit to Stand: Contact guard assist   Step pivot transfers: Contact guard assist            Ambulation/Gait Ambulation/Gait assistance: Contact  guard assist Gait Distance (Feet): 5 Feet Assistive device: Rolling walker (2 wheels)         General Gait Details: close chair follow pt self limited distance   Stairs             Wheelchair Mobility     Tilt Bed    Modified Rankin (Stroke Patients Only)       Balance Overall balance assessment: Needs assistance Sitting-balance support: Feet supported, Bilateral upper extremity supported Sitting balance-Leahy Scale: Good     Standing balance support: Bilateral upper extremity supported, During functional activity, Reliant on assistive device for balance Standing balance-Leahy Scale: Fair                              Cognition Arousal: Alert Behavior During Therapy: WFL for tasks assessed/performed Overall Cognitive Status: Within Functional Limits for tasks assessed                                          Exercises      General Comments General comments (skin integrity, edema, etc.): placed 3L for mobility, returned to 2L at end of session      Pertinent Vitals/Pain Pain Assessment Pain Assessment: Faces Pain Score: 4  Pain Location: L hip Pain Descriptors / Indicators: Aching, Grimacing, Guarding, Moaning Pain Intervention(s): Limited activity within patient's  tolerance, Monitored during session, Premedicated before session, Repositioned    Home Living                          Prior Function            PT Goals (current goals can now be found in the care plan section) Progress towards PT goals: Progressing toward goals    Frequency    7X/week      PT Plan      Co-evaluation              AM-PAC PT "6 Clicks" Mobility   Outcome Measure  Help needed turning from your back to your side while in a flat bed without using bedrails?: A Little Help needed moving from lying on your back to sitting on the side of a flat bed without using bedrails?: A Little Help needed moving to and from a bed  to a chair (including a wheelchair)?: A Little Help needed standing up from a chair using your arms (e.g., wheelchair or bedside chair)?: A Little Help needed to walk in hospital room?: A Little Help needed climbing 3-5 steps with a railing? : A Lot 6 Click Score: 17    End of Session Equipment Utilized During Treatment: Gait belt;Oxygen Activity Tolerance: Patient tolerated treatment well Patient left: in chair;with call bell/phone within reach;with chair alarm set Nurse Communication: Mobility status PT Visit Diagnosis: Other abnormalities of gait and mobility (R26.89);Difficulty in walking, not elsewhere classified (R26.2);History of falling (Z91.81);Muscle weakness (generalized) (M62.81);Unsteadiness on feet (R26.81);Pain Pain - Right/Left: Left Pain - part of body: Hip     Time: 9629-5284 PT Time Calculation (min) (ACUTE ONLY): 17 min  Charges:    $Therapeutic Activity: 8-22 mins PT General Charges $$ ACUTE PT VISIT: 1 Visit                     Olga Coaster PT, DPT 3:17 PM,05/01/23

## 2023-05-01 NOTE — Assessment & Plan Note (Addendum)
Continue chronic oxygen 2 L

## 2023-05-01 NOTE — Progress Notes (Signed)
I have reviewed and concur with this student's documentation.   Allyn Kenner, RN, BSN 05/01/2023 1:23 PM

## 2023-05-01 NOTE — Progress Notes (Signed)
Nutrition Follow-up  DOCUMENTATION CODES:   Non-severe (moderate) malnutrition in context of chronic illness  INTERVENTION:   -Continue Ensure Enlive po BID, each supplement provides 350 kcal and 20 grams of protein -Continue MVI with minerals daily -Continue regular diet  NUTRITION DIAGNOSIS:   Moderate Malnutrition related to chronic illness (COPD) as evidenced by mild fat depletion, moderate fat depletion, mild muscle depletion, moderate muscle depletion.  Ongoing  GOAL:   Patient will meet greater than or equal to 90% of their needs  Progressing   MONITOR:   PO intake, Supplement acceptance  REASON FOR ASSESSMENT:   Consult Hip fracture protocol  ASSESSMENT:   Pt admitted with SOB, chest pain, fall and L hip pain. PMH significant for COPD, HTN, GIB, PUD, depression and anxiety, polysubstance use, chronic pain, liver cirrhosis, breast cancer.  10/21- s/p PROCEDURE:  INTRAMEDULLARY (IM) NAIL INTERTROCHANTERIC left hip   Reviewed I/O's: -360 ml x 24 hours and -1.4 L since admission  UOP: 600 ml x 24 hours   Pt remains with good appetite. Noted meal completions 50-100%. Pt also drinking Ensure supplements.   Per TOC notes, pt awaiting SNF placement.   Wt has been stable since admission.   Medications reviewed and include vitamin B-12, ferrous sulfate, neurontin, solu-medrol, and senokot.   Labs reviewed: Na: 131, CBGS: 179 (inpatient orders for glycemic control are none).    Diet Order:   Diet Order             Diet regular Room service appropriate? Yes; Fluid consistency: Thin; Fluid restriction: 1200 mL Fluid  Diet effective now                   EDUCATION NEEDS:   Education needs have been addressed  Skin:  Skin Assessment: Skin Integrity Issues: Skin Integrity Issues:: Incisions Incisions: closed lt hip  Last BM:  04/29/23  Height:   Ht Readings from Last 1 Encounters:  05/01/23 5\' 5"  (1.651 m)    Weight:   Wt Readings from Last  1 Encounters:  05/01/23 59.2 kg    Ideal Body Weight:  59.1 kg  BMI:  Body mass index is 21.72 kg/m.  Estimated Nutritional Needs:   Kcal:  1750-1950  Protein:  90-105 grams  Fluid:  > 1.7 L    Levada Schilling, RD, LDN, CDCES Registered Dietitian III Certified Diabetes Care and Education Specialist Please refer to [facility] for RD and/or RD on-call/weekend/after hours pager

## 2023-05-01 NOTE — Progress Notes (Signed)
Progress Note   Patient: Regina Bernard DOB: [birth year] DOA: 04/25/2023     6 DOS: the patient was seen and examined on 05/01/2023   Brief hospital course: Regina Bernard is a 67 y.o. female with medical history significant of COPD on 2L O2, HTN, GI bleeding, PUD, depression with anxiety, polysubstance abuse, chronic pain, liver cirrhosis, breast cancer, who presents with shortness of breath, chest pain, fall and left hip pain.  INTERTROCHANTERIC left hip nail performed on 10/19.  10/23.  Patient does complain of some shortness of breath and wheezing.  She states she always wheezes.  She has not had her inhaler.  Starting Solu-Medrol. 10/24.  Hemoglobin 7.8 will give a unit of blood.  Lungs sound better today.  Continue Solu-Medrol and inhalers  Assessment and Plan: * COPD with acute exacerbation (HCC) Continue Solu-Medrol.  Continue Trelegy substitute.  Continue nebulizer treatments.  Patient with better air entry today.  Patient requested flu and pneumococcal vaccination.  ABLA (acute blood loss anemia) Postoperative anemia and drifting down.  Transfuse 1 unit of packed red blood cells on a hemoglobin of 7.8 today.  Benefits and risk of transfusion explained to patient.  Closed left hip fracture (HCC) Pain control.  I explained to the patient the quicker we can get over to Tylenol open better.  Hyperkalemia Improved  Liver cirrhosis (HCC) Follow-up as outpatient  AKI (acute kidney injury) (HCC) Creatinine 1.78 on 10/20.  Creatinine 1.1 today.  PUD (peptic ulcer disease) On Protonix  Essential hypertension On Toprol  Chronic hypoxic respiratory failure (HCC) Continue chronic oxygen  Depression with anxiety On Lamictal, Cymbalta and gabapentin  Protein-calorie malnutrition, moderate (HCC) Continue supplements        Subjective: Patient still has some pain with her left leg.  Breathing little bit better today.  Agreeable to blood transfusion with  hemoglobin drifting down to 7.8.  Physical Exam: Vitals:   05/01/23 0941 05/01/23 0943 05/01/23 1212 05/01/23 1446  BP: 122/67 122/67 136/86 114/72  Pulse: 82 82 76 80  Resp: 15 15 15 14   Temp: 98 F (36.7 C) 98 F (36.7 C) 98.2 F (36.8 C) 97.8 F (36.6 C)  TempSrc:  Oral    SpO2: 95% 95% 94% 100%  Weight:      Height:       Physical Exam HENT:     Head: Normocephalic.     Mouth/Throat:     Pharynx: No oropharyngeal exudate.  Eyes:     General: Lids are normal.     Conjunctiva/sclera: Conjunctivae normal.  Cardiovascular:     Rate and Rhythm: Normal rate and regular rhythm.     Heart sounds: Normal heart sounds, S1 normal and S2 normal.  Pulmonary:     Breath sounds: Examination of the right-lower field reveals decreased breath sounds and wheezing. Examination of the left-lower field reveals decreased breath sounds and wheezing. Decreased breath sounds and wheezing present. No rhonchi or rales.  Abdominal:     Palpations: Abdomen is soft.     Tenderness: There is no abdominal tenderness.  Musculoskeletal:     Right lower leg: No swelling.     Left lower leg: No swelling.  Skin:    General: Skin is warm.     Findings: No rash.  Neurological:     Mental Status: She is alert and oriented to person, place, and time.     Comments: Unable to straight leg raise very well with her left leg.  Able to straight  leg raise with the right leg.     Data Reviewed: Hemoglobin 7.8, creatinine 1.1, sodium 131  Family Communication: Left message for husband  Disposition: Status is: Inpatient Remains inpatient appropriate because: Receiving a unit of blood today.  TOC looking into rehab beds.  Planned Discharge Destination: Rehab    Time spent: 28 minutes Case discussed with pharmacist and nursing staff.  Author: Alford Highland, MD 05/01/2023 3:20 PM  For on call review [email or web address].

## 2023-05-01 NOTE — Plan of Care (Signed)

## 2023-05-01 NOTE — Plan of Care (Signed)
  Problem: Education: Goal: Knowledge of General Education information will improve Description: Including pain rating scale, medication(s)/side effects and non-pharmacologic comfort measures Outcome: Progressing   Problem: Health Behavior/Discharge Planning: Goal: Ability to manage health-related needs will improve Outcome: Progressing   Problem: Pain Managment: Goal: General experience of comfort will improve Outcome: Progressing   Problem: Safety: Goal: Ability to remain free from injury will improve Outcome: Progressing   Problem: Clinical Measurements: Goal: Postoperative complications will be avoided or minimized Outcome: Progressing   Problem: Self-Concept: Goal: Ability to maintain and perform role responsibilities to the fullest extent possible will improve Outcome: Progressing   Problem: Pain Management: Goal: Pain level will decrease Outcome: Progressing

## 2023-05-01 NOTE — Progress Notes (Signed)
Pulmonary Medicine          Date: 05/01/2023,   MRN# [id number] Regina Bernard 03/29/1956     AdmissionWeight: 59 kg                 CurrentWeight: 59.2 kg    HISTORY OF PRESENT ILLNESS   Today breathing easier, discussed the need to stop smoking. Family in room. Laying flat in bed.  Speaking in full sentences, no use of accessory muscles. No new cardio pulmonary sxs.  See last note   PAST MEDICAL HISTORY   Past Medical History:  Diagnosis Date   Abnormal CT scan, chest    Anxiety    Asthma    Breast cancer (HCC)    Breast CA- Right    Breast cancer (HCC) 06/18/2011   right breast cancer   COPD (chronic obstructive pulmonary disease) (HCC)    Endometriosis    tx with vaginal hysterectomy   Fatty liver    Hepatitis C 1998   [facility] trial treatment in-patient study 2005. HCV 740, 05/20/11.   History of substance abuse (HCC)    Heroine, cocaine, marijuana. States all of them. Heavy use 1995 to 2000. Occasional use before and after. None since 2006.    Hot flashes related to aromatase inhibitor therapy    Hypertension    Lung mass    Peripheral neuropathy    in hands and feet     SURGICAL HISTORY   Past Surgical History:  Procedure Laterality Date   BREAST LUMPECTOMY Right 2013   COLONOSCOPY WITH PROPOFOL N/A 03/20/2022   Procedure: COLONOSCOPY WITH PROPOFOL;  Surgeon: Toney Reil, MD;  Location[facility] ENDOSCOPY;  Service: Gastroenterology;  Laterality: N/A;   ESOPHAGOGASTRODUODENOSCOPY (EGD) WITH PROPOFOL N/A 10/29/2021   Procedure: ESOPHAGOGASTRODUODENOSCOPY (EGD) WITH PROPOFOL;  Surgeon: Toney Reil, MD;  Location: [facility] ENDOSCOPY;  Service: Gastroenterology;  Laterality: N/A;   ESOPHAGOGASTRODUODENOSCOPY (EGD) WITH PROPOFOL N/A 02/14/2022   Procedure: ESOPHAGOGASTRODUODENOSCOPY (EGD) WITH PROPOFOL;  Surgeon: Toney Reil, MD;  Location: [facility] ENDOSCOPY;  Service: Gastroenterology;  Laterality: N/A;  DRIVER 5 - 10 MINUTES AWAY    INTRAMEDULLARY (IM) NAIL INTERTROCHANTERIC Left 04/26/2023   Procedure: INTRAMEDULLARY (IM) NAIL INTERTROCHANTERIC;  Surgeon: Deeann Saint, MD;  Location: [facility] ORS;  Service: Orthopedics;  Laterality: Left;   TUBAL LIGATION  1996   Upper right leg benign tumor removed     VAGINAL HYSTERECTOMY  2001     FAMILY HISTORY   Family History  Problem Relation Age of Onset   Diabetes Maternal Grandmother    Lung cancer Father    Lung cancer Mother    Hypertension Mother    Heart disease Mother    Skin cancer Mother    Breast cancer Neg Hx      SOCIAL HISTORY   Social History   Tobacco Use   Smoking status: Every Day    Types: E-cigarettes   Smokeless tobacco: Never   Tobacco comments:    last cigeratte use 6 days ago (11/30/15)  Vaping Use   Vaping status: Every Day  Substance Use Topics   Alcohol use: Yes    Comment: occ   Drug use: No     MEDICATIONS    Home Medication:    Current Medication:  Current Facility-Administered Medications:    acetaminophen (TYLENOL) tablet 650 mg, 650 mg, Oral, Q6H PRN, Deeann Saint, MD   albuterol (PROVENTIL) (2.5 MG/3ML) 0.083% nebulizer solution 2.5 mg, 2.5 mg, Nebulization, Q4H PRN, Hyacinth Meeker,  Dimas Aguas, MD, 2.5 mg at 04/26/23 0918   alum & mag hydroxide-simeth (MAALOX/MYLANTA) 200-200-20 MG/5ML suspension 30 mL, 30 mL, Oral, Q4H PRN, Deeann Saint, MD   bisacodyl (DULCOLAX) suppository 10 mg, 10 mg, Rectal, Daily PRN, Deeann Saint, MD   cyanocobalamin (VITAMIN B12) tablet 1,000 mcg, 1,000 mcg, Oral, Daily, Marrion Coy, MD, 1,000 mcg at 05/01/23 0902   dextromethorphan-guaiFENesin (MUCINEX DM) 30-600 MG per 12 hr tablet 1 tablet, 1 tablet, Oral, BID PRN, Deeann Saint, MD   DULoxetine (CYMBALTA) DR capsule 30 mg, 30 mg, Oral, BID, Deeann Saint, MD, 30 mg at 05/01/23 0901   enoxaparin (LOVENOX) injection 30 mg, 30 mg, Subcutaneous, Q24H, Deeann Saint, MD, 30 mg at 05/01/23 9528   feeding supplement (ENSURE ENLIVE / ENSURE PLUS)  liquid 237 mL, 237 mL, Oral, BID BM, Deeann Saint, MD, 237 mL at 04/30/23 1437   ferrous sulfate tablet 325 mg, 325 mg, Oral, Q breakfast, Deeann Saint, MD, 325 mg at 05/01/23 0752   fluticasone furoate-vilanterol (BREO ELLIPTA) 100-25 MCG/ACT 1 puff, 1 puff, Inhalation, Daily, 1 puff at 05/01/23 0825 **AND** umeclidinium bromide (INCRUSE ELLIPTA) 62.5 MCG/ACT 1 puff, 1 puff, Inhalation, Daily, Wieting, Richard, MD, 1 puff at 05/01/23 0825   gabapentin (NEURONTIN) capsule 300 mg, 300 mg, Oral, TID, Deeann Saint, MD, 300 mg at 05/01/23 0901   ipratropium-albuterol (DUONEB) 0.5-2.5 (3) MG/3ML nebulizer solution 3 mL, 3 mL, Nebulization, TID, Renae Gloss, Richard, MD, 3 mL at 05/01/23 1404   lamoTRIgine (LAMICTAL) tablet 100 mg, 100 mg, Oral, Daily, Deeann Saint, MD, 100 mg at 05/01/23 0901   lidocaine (LIDODERM) 5 % 1 patch, 1 patch, Transdermal, Q24H, Deeann Saint, MD, 1 patch at 04/30/23 2058   loperamide (IMODIUM) capsule 2 mg, 2 mg, Oral, PRN, Deeann Saint, MD   menthol-cetylpyridinium (CEPACOL) lozenge 3 mg, 1 lozenge, Oral, PRN **OR** phenol (CHLORASEPTIC) mouth spray 1 spray, 1 spray, Mouth/Throat, PRN, Deeann Saint, MD   methocarbamol (ROBAXIN) tablet 500 mg, 500 mg, Oral, Q8H PRN, Deeann Saint, MD   methylPREDNISolone sodium succinate (SOLU-MEDROL) 40 mg/mL injection 40 mg, 40 mg, Intravenous, Daily, Renae Gloss, Richard, MD, 40 mg at 05/01/23 0900   metoCLOPramide (REGLAN) tablet 5-10 mg, 5-10 mg, Oral, Q8H PRN **OR** metoCLOPramide (REGLAN) injection 5-10 mg, 5-10 mg, Intravenous, Q8H PRN, Deeann Saint, MD   metoprolol succinate (TOPROL-XL) 24 hr tablet 100 mg, 100 mg, Oral, Daily, Deeann Saint, MD, 100 mg at 05/01/23 4132   multivitamin with minerals tablet 1 tablet, 1 tablet, Oral, Daily, Deeann Saint, MD, 1 tablet at 05/01/23 4401   nicotine (NICODERM CQ - dosed in mg/24 hours) patch 21 mg, 21 mg, Transdermal, Daily, Deeann Saint, MD, 21 mg at 05/01/23 0903   nitroGLYCERIN  (NITROSTAT) SL tablet 0.4 mg, 0.4 mg, Sublingual, Q5 min PRN, Deeann Saint, MD   ondansetron [facility]) injection 4 mg, 4 mg, Intravenous, Q8H PRN, Deeann Saint, MD   oxyCODONE-acetaminophen (PERCOCET/ROXICET) 5-325 MG per tablet 1-2 tablet, 1-2 tablet, Oral, Q4H PRN, Marrion Coy, MD, 2 tablet at 05/01/23 1052   pantoprazole (PROTONIX) EC tablet 40 mg, 40 mg, Oral, Daily, Deeann Saint, MD, 40 mg at 05/01/23 0901   QUEtiapine (SEROQUEL) tablet 25 mg, 25 mg, Oral, QHS PRN, Deeann Saint, MD   senna (SENOKOT) tablet 8.6 mg, 1 tablet, Oral, BID, Deeann Saint, MD, 8.6 mg at 05/01/23 0901   senna-docusate (Senokot-S) tablet 1 tablet, 1 tablet, Oral, QHS PRN, Deeann Saint, MD   sodium chloride flush (NS) 0.9 % injection 3 mL, 3 mL, Intravenous, Q12H, Hyacinth Meeker,  Dimas Aguas, MD, 3 mL at 05/01/23 0112   sodium phosphate (FLEET) enema 1 enema, 1 enema, Rectal, Once PRN, Deeann Saint, MD    ALLERGIES   Penicillins     REVIEW OF SYSTEMS    Review of Systems:  Gen:  Denies  fever, sweats, chills weigh loss  HEENT: Denies blurred vision, double vision, ear pain, eye pain, hearing loss, nose bleeds, sore throat Cardiac:  No dizziness, chest pain or heaviness, chest tightness,edema Resp:   less cough, shortness of breath, and wheezing,no  hemoptysis,  Gi: Denies swallowing difficulty, stomach pain, nausea or vomiting, diarrhea, constipation, bowel incontinence Gu:  Denies bladder incontinence, burning urine Ext:   Denies Joint pain, stiffness or swelling Skin: Denies  skin rash, easy bruising or bleeding or hives Endoc:  Denies polyuria, polydipsia , polyphagia or weight change Psych:   Denies depression, insomnia or hallucinations   Other:  All other systems negative   VS: BP 136/86 (BP Location: Left Arm)   Pulse 76   Temp 98.2 F (36.8 C)   Resp 15   Ht 5\' 5"  (1.651 m)   Wt 59.2 kg   SpO2 94%   BMI 21.72 kg/m      PHYSICAL EXAM    GENERAL:NAD, no fevers, chills, no  weakness no fatigue, family at bed side HEAD: Normocephalic, atraumatic.  EYES: Pupils equal, round, reactive to light. Extraocular muscles intact. No scleral icterus.  MOUTH: Moist mucosal membrane. Dentition intact. No abscess noted.  EAR, NOSE, THROAT: Clear without exudates. No external lesions.  NECK: Supple. No thyromegaly. No nodules. No JVD.  PULMONARY:   less+wheezes CARDIOVASCULAR: S1 and S2. Regular rate and rhythm. No murmurs, rubs, or gallops. No edema. Pedal pulses 2+ bilaterally.  GASTROINTESTINAL: Soft, nontender, nondistended. No masses. Positive bowel sounds. No hepatosplenomegaly.  MUSCULOSKELETAL: No swelling, clubbing, or edema. Range of motion full in all extremities.  NEUROLOGIC: Cranial nerves II through XII are intact. No gross focal neurological deficits. Sensation intact. Reflexes intact.  SKIN: No ulceration, lesions, rashes, or cyanosis. Skin warm and dry. Turgor intact.  PSYCHIATRIC: Mood, affect within normal limits. The patient is awake, alert and oriented x 3. Insight, judgment intact.       IMAGING    DG HIP UNILAT WITH PELVIS 2-3 VIEWS LEFT  Result Date: 04/26/2023 CLINICAL DATA:  Left femoral nail EXAM: DG HIP (WITH OR WITHOUT PELVIS) 2V LEFT COMPARISON:  Hip radiograph dated April 25, 2023 FINDINGS: Fluoroscopic images were obtained intraoperatively and submitted for post operative interpretation. Left femoral intramedullary nail placement with hardware in expected position, 5 images were obtained with 39 seconds of fluoroscopy time and 6.61 mGy. Please see the performing provider's procedural report for further detail. IMPRESSION: Fluoroscopic images were obtained intraoperatively for left femoral intramedullary nail placement. Electronically Signed   By: Allegra Lai M.D.   On: 04/26/2023 14:32   DG C-Arm 1-60 Min-No Report  Result Date: 04/26/2023 Fluoroscopy was utilized by the requesting physician.  No radiographic interpretation.   CT  Angio Chest Pulmonary Embolism (PE) W or WO Contrast  Result Date: 04/25/2023 CLINICAL DATA:  Recent fall with left-sided chest pain, initial encounter EXAM: CT ANGIOGRAPHY CHEST WITH CONTRAST TECHNIQUE: Multidetector CT imaging of the chest was performed using the standard protocol during bolus administration of intravenous contrast. Multiplanar CT image reconstructions and MIPs were obtained to evaluate the vascular anatomy. RADIATION DOSE REDUCTION: This exam was performed according to the departmental dose-optimization program which includes automated exposure control, adjustment of the mA  and/or kV according to patient size and/or use of iterative reconstruction technique. CONTRAST:  75mL OMNIPAQUE IOHEXOL 350 MG/ML SOLN COMPARISON:  Chest x-ray from earlier the same day, 10/02/2022. FINDINGS: Cardiovascular: Atherosclerotic calcifications of the thoracic aorta are noted. No aneurysmal dilatation is seen. No cardiac enlargement is noted. The pulmonary artery is well visualized within normal branching pattern bilaterally. No filling defect to suggest pulmonary embolism is noted. Mediastinum/Nodes: Thoracic inlet is within normal limits. The esophagus as visualized is unremarkable. No hilar or mediastinal adenopathy is noted. Scattered calcifications are noted in the hilar nodes likely related to prior granulomatous disease. Lungs/Pleura: Emphysematous changes are seen similar to that noted on the prior exam. Cavitary lesions are seen in the left upper lobe and lingula stable from the prior exam. No parenchymal nodule is seen. No effusion or focal infiltrate is seen. Upper Abdomen: No acute abnormality. Musculoskeletal: Degenerative changes of the thoracic spine are noted. No acute rib abnormality is noted. Scoliosis concave to the left is noted in the thoracic spine. Review of the MIP images confirms the above findings. IMPRESSION: No evidence of pulmonary emboli. Chronic emphysematous changes with cavitary  lesion stable from the prior exam. No acute rib abnormality is noted. Aortic Atherosclerosis (ICD10-I70.0) and Emphysema (ICD10-J43.9). Electronically Signed   By: Alcide Clever M.D.   On: 04/25/2023 23:32   DG Knee Complete 4 Views Left  Result Date: 04/25/2023 CLINICAL DATA:  Fall on left side, left hip pain. EXAM: LEFT KNEE - COMPLETE 4+ VIEW COMPARISON:  None Available. FINDINGS: No evidence of fracture, dislocation, or joint effusion. The alignment and joint spaces are preserved. There is chondrocalcinosis. No evidence of arthropathy or other focal bone abnormality. Soft tissues are unremarkable. IMPRESSION: 1. No fracture or dislocation of the left knee. 2. Chondrocalcinosis. Electronically Signed   By: Narda Rutherford M.D.   On: 04/25/2023 18:54   DG Chest Port 1 View  Result Date: 04/25/2023 CLINICAL DATA:  Shortness of breath.  Fall on left side. EXAM: PORTABLE CHEST 1 VIEW COMPARISON:  Radiograph 07/15/2022, CT 10/02/2022 FINDINGS: Emphysema with chronic left perihilar scarring. Previous cavitary lesion in the left lung on prior CT is less well-defined on the current exam. Chronic subpleural reticular markings at the right lung base. There is no acute airspace disease, pleural effusion, or pneumothorax. The heart is normal in size. Prominent thoracic scoliosis. IMPRESSION: 1. No acute abnormality. 2. Chronic lung disease. Emphysema with chronic left perihilar scarring. Electronically Signed   By: Narda Rutherford M.D.   On: 04/25/2023 18:54   DG Hip Unilat W or Wo Pelvis 2-3 Views Left  Result Date: 04/25/2023 CLINICAL DATA:  Fall with left hip pain. EXAM: DG HIP (WITH OR WITHOUT PELVIS) 2-3V LEFT COMPARISON:  None Available. FINDINGS: Displaced intertrochanteric left proximal femur fracture. The femoral head is well seated, no hip dislocation. Intact bony pelvis, including pubic rami. No pubic symphyseal or sacroiliac diastasis. IMPRESSION: Displaced intertrochanteric left proximal femur  fracture. Electronically Signed   By: Narda Rutherford M.D.   On: 04/25/2023 18:51      ASSESSMENT/PLAN   COPD, stage II-III,  ex smoker, severe scoliosis, acute on chronic dyspnea, with bronchospasm. Copd exacerbation. On broad coverage. She hardly ever wheeze free at baseline. Her anxiety is also a contributor.  Phx of pseudomonas colonization. Today with present regimen her breathing has improved. .  -Continue trelegy ( she has her own) and albuterol nebs qid -mucinex, flutter valve, incentive spirometry -short course of solumederol as ordered,po steroid taper  tomorrow -dvt prophylaxis ( anticoagution, following h/h vs mechanical prophylaxis).  -physical therapy in progress.  -out patient f/u on the chest ct changesn ( cavitary lesions, etc)   Thank you for allowing me to participate in the care of this patient.   Patient/Family are satisfied with care plan and all questions have been answered.  This document was prepared using Dragon voice recognition software and may include unintentional dictation errors.     Ned Clines, M.D.  Division of Pulmonary & Critical Care Medicine  [facility]

## 2023-05-02 DIAGNOSIS — E875 Hyperkalemia: Secondary | ICD-10-CM | POA: Diagnosis not present

## 2023-05-02 DIAGNOSIS — D62 Acute posthemorrhagic anemia: Secondary | ICD-10-CM | POA: Diagnosis not present

## 2023-05-02 DIAGNOSIS — K279 Peptic ulcer, site unspecified, unspecified as acute or chronic, without hemorrhage or perforation: Secondary | ICD-10-CM

## 2023-05-02 DIAGNOSIS — S72002S Fracture of unspecified part of neck of left femur, sequela: Secondary | ICD-10-CM | POA: Diagnosis not present

## 2023-05-02 DIAGNOSIS — J441 Chronic obstructive pulmonary disease with (acute) exacerbation: Secondary | ICD-10-CM | POA: Diagnosis not present

## 2023-05-02 HISTORY — DX: Peptic ulcer, site unspecified, unspecified as acute or chronic, without hemorrhage or perforation: K27.9

## 2023-05-02 LAB — CBC
HCT: 30.5 % — ABNORMAL LOW (ref 36.0–46.0)
Hemoglobin: 10.2 g/dL — ABNORMAL LOW (ref 12.0–15.0)
MCH: 32.5 pg (ref 26.0–34.0)
MCHC: 33.4 g/dL (ref 30.0–36.0)
MCV: 97.1 fL (ref 80.0–100.0)
Platelets: 326 K/uL (ref 150–400)
RBC: 3.14 MIL/uL — ABNORMAL LOW (ref 3.87–5.11)
RDW: 16.1 % — ABNORMAL HIGH (ref 11.5–15.5)
WBC: 11.1 K/uL — ABNORMAL HIGH (ref 4.0–10.5)
nRBC: 0 % (ref 0.0–0.2)

## 2023-05-02 LAB — BPAM RBC
Blood Product Expiration Date: 202410242359
ISSUE DATE / TIME: 202410240923
Unit Type and Rh: 9500

## 2023-05-02 LAB — TYPE AND SCREEN
ABO/RH(D): A POS
Antibody Screen: NEGATIVE
Unit division: 0

## 2023-05-02 LAB — BASIC METABOLIC PANEL
Anion gap: 7 (ref 5–15)
BUN: 40 mg/dL — ABNORMAL HIGH (ref 8–23)
CO2: 32 mmol/L (ref 22–32)
Calcium: 8.9 mg/dL (ref 8.9–10.3)
Chloride: 95 mmol/L — ABNORMAL LOW (ref 98–111)
Creatinine, Ser: 1.05 mg/dL — ABNORMAL HIGH (ref 0.44–1.00)
GFR, Estimated: 59 mL/min — ABNORMAL LOW (ref >60)
Glucose, Bld: 72 mg/dL (ref 70–99)
Potassium: 4.6 mmol/L (ref 3.5–5.1)
Sodium: 134 mmol/L — ABNORMAL LOW (ref 135–145)

## 2023-05-02 MED ORDER — NICOTINE 21 MG/24HR TD PT24: 21.0000 mg | MEDICATED_PATCH | Freq: Every day | TRANSDERMAL | 0 refills | Status: AC

## 2023-05-02 MED ORDER — ACETAMINOPHEN 325 MG PO TABS: 650.0000 mg | ORAL_TABLET | Freq: Four times a day (QID) | ORAL | Status: DC | PRN

## 2023-05-02 MED ORDER — PREDNISONE 10 MG PO TABS: ORAL_TABLET | ORAL | 0 refills | Status: DC

## 2023-05-02 MED ORDER — ENOXAPARIN SODIUM 30 MG/0.3ML IJ SOSY: 30.0000 mg | PREFILLED_SYRINGE | INTRAMUSCULAR | 0 refills | Status: DC

## 2023-05-02 MED ORDER — GABAPENTIN 300 MG PO CAPS: 300.0000 mg | ORAL_CAPSULE | Freq: Three times a day (TID) | ORAL | 0 refills | Status: AC

## 2023-05-02 MED ORDER — ENSURE ENLIVE PO LIQD: 237.0000 mL | Freq: Two times a day (BID) | ORAL | 0 refills | Status: AC

## 2023-05-02 MED ORDER — LAMOTRIGINE 100 MG PO TABS: 100.0000 mg | ORAL_TABLET | Freq: Every day | ORAL | 0 refills | Status: AC

## 2023-05-02 MED ORDER — FERROUS SULFATE 325 (65 FE) MG PO TABS: 325.0000 mg | ORAL_TABLET | Freq: Every day | ORAL | 0 refills | Status: DC

## 2023-05-02 MED ORDER — SENNA 8.6 MG PO TABS: 1.0000 | ORAL_TABLET | Freq: Two times a day (BID) | ORAL | 0 refills | Status: AC

## 2023-05-02 MED ORDER — OXYCODONE-ACETAMINOPHEN 5-325 MG PO TABS: 1.0000 | ORAL_TABLET | Freq: Four times a day (QID) | ORAL | 0 refills | Status: AC | PRN

## 2023-05-02 MED ORDER — CYANOCOBALAMIN 1000 MCG PO TABS: 1000.0000 ug | ORAL_TABLET | Freq: Every day | ORAL | 0 refills | Status: DC

## 2023-05-02 NOTE — TOC Progression Note (Signed)
Transition of Care [facility]) - Progression Note    Patient Details  Name: RUDY MCCLENNY MRN: [id number] Date of Birth: [birth year]  [facility] CM/SW Contact  Margarito Liner, LCSW Phone Number: 05/02/2023, 12:28 PM  Clinical Narrative:  [facility] approved. Auth number has not generated yet but [facility] admissions coordinator said we do not have to wait on that. Reference # [id number]. Valid 10/25-10/29.   Expected Discharge Plan: Skilled Nursing Facility Barriers to Discharge: SNF Pending bed offer, Insurance Authorization  Expected Discharge Plan and Services       Living arrangements for the past 2 months: Single Family Home                                       Social Determinants of Health (SDOH) Interventions SDOH Screenings   Food Insecurity: No Food Insecurity (05/01/2023)  Housing: Patient Declined (05/01/2023)  Transportation Needs: No Transportation Needs (05/01/2023)  Utilities: Not At Risk (05/01/2023)  Tobacco Use: High Risk (04/25/2023)    Readmission Risk Interventions     No data to display

## 2023-05-02 NOTE — Progress Notes (Signed)
PT Cancellation Note  Patient Details Name: Regina Bernard MRN: [id number] DOB: 04/30/1956   Cancelled Treatment:    Reason Eval/Treat Not Completed: Patient declined, no reason specified. Patient refused. States she is going to rehab today and doesn't want to get all sore and tired this morning. Will re-attempt at later time/date.   Artice Holohan 05/02/2023, 11:31 AM

## 2023-05-02 NOTE — TOC Progression Note (Signed)
Transition of Care [facility]) - Progression Note    Patient Details  Name: Regina Bernard MRN: [id number] Date of Birth: [birth year]  Transition of Care [facility]) CM/SW Contact  Liliana Cline, LCSW Phone Number: 05/02/2023, 12:18 PM  Clinical Narrative:    [facility] Harvey started in [place] for [facility].   Expected Discharge Plan: Skilled Nursing Facility Barriers to Discharge: SNF Pending bed offer, Insurance Authorization  Expected Discharge Plan and Services       Living arrangements for the past 2 months: Single Family Home                                       Social Determinants of Health (SDOH) Interventions SDOH Screenings   Food Insecurity: No Food Insecurity (05/01/2023)  Housing: Patient Declined (05/01/2023)  Transportation Needs: No Transportation Needs (05/01/2023)  Utilities: Not At Risk (05/01/2023)  Tobacco Use: High Risk (04/25/2023)    Readmission Risk Interventions     No data to display

## 2023-05-02 NOTE — Plan of Care (Signed)

## 2023-05-02 NOTE — TOC Transition Note (Signed)
Transition of Care [facility]) - CM/SW Discharge Note   Patient Details  Name: Regina Bernard MRN: [id number] Date of Birth: Oct 17, 1955  Transition of Care [facility]) CM/SW Contact:  Margarito Liner, LCSW Phone Number: 05/02/2023, 2:50 PM   Clinical Narrative:  Patient has orders to discharge home today. RN has already called report. EMS transport arranged for 4:30. No further concerns. CSW signing off.   Final next level of care: Skilled Nursing Facility Barriers to Discharge: Barriers Resolved   Patient Goals and CMS Choice   Choice offered to / list presented to : Patient  Discharge Placement PASRR number recieved: 04/29/23 PASRR number recieved: 04/29/23            Patient chooses bed at: [facility] Patient to be transferred to facility by: EMS   Patient and family notified of of transfer: 05/02/23  Discharge Plan and Services Additional resources added to the After Visit Summary for                                       Social Determinants of Health (SDOH) Interventions SDOH Screenings   Food Insecurity: No Food Insecurity (05/01/2023)  Housing: Patient Declined (05/01/2023)  Transportation Needs: No Transportation Needs (05/01/2023)  Utilities: Not At Risk (05/01/2023)  Tobacco Use: High Risk (04/25/2023)     Readmission Risk Interventions     No data to display

## 2023-05-02 NOTE — Discharge Summary (Signed)
Physician Discharge Summary   Patient: Regina Bernard MRN: [id number] DOB: 1955-11-30  Admit date:     04/25/2023  Discharge date: 05/02/23  Discharge Physician: Alford Highland   PCP: Dorothey Baseman, MD   Recommendations at discharge:   Follow-up with team at rehab 1 day Follow-up Dr. Deeann Saint around 10 days  Discharge Diagnoses: Principal Problem:   COPD with acute exacerbation (HCC) Active Problems:   ABLA (acute blood loss anemia)   Closed left hip fracture (HCC)   Liver cirrhosis (HCC)   Hyperkalemia   Chest pain   AKI (acute kidney injury) (HCC)   PUD (peptic ulcer disease)   Essential hypertension   Chronic respiratory failure with hypoxia (HCC)   Depression with anxiety   Protein-calorie malnutrition, moderate [facility])   Hospital Course: Regina Bernard is a 67 y.o. female with medical history significant of COPD on 2L O2, HTN, GI bleeding, PUD, depression with anxiety, polysubstance abuse, chronic pain, liver cirrhosis, breast cancer, who presents with shortness of breath, chest pain, fall and left hip pain.  INTERTROCHANTERIC left hip nail performed on 10/19.  10/23.  Patient does complain of some shortness of breath and wheezing.  She states she always wheezes.  She has not had her inhaler.  Starting Solu-Medrol. 10/24.  Hemoglobin 7.8 will give a unit of blood.  Lungs sound better today.  Continue Solu-Medrol and inhalers 10/25.  Hemoglobin up to 10.2 after transfusion yesterday.  Still having pain in her left hip.  Breathing is better.   Assessment and Plan: * COPD with acute exacerbation (HCC) Continue Solu-Medrol this morning and switch over to prednisone taper for tomorrow.  Continue Trelegy.  Continue as needed nebulizer treatments.  Patient received flu and pneumococcal vaccination.  History of Pseudomonas colonization.  The patient did not receive treatment for the Pseudomonas in the sputum culture.  No pneumonia on previous chest x-ray.  ABLA  (acute blood loss anemia) Hemoglobin up to 10.2 after transfusion yesterday.  Continue oral iron.  Continue B12 supplementation.  Closed left hip fracture (HCC) Pain control.  I explained to the patient the quicker we can get over to Tylenol, the better.  Pain pills prescribed.  14 days of Lovenox prescribed.  Hyperkalemia Improved  Liver cirrhosis (HCC) Follow-up as outpatient  AKI (acute kidney injury) (HCC) Creatinine 1.78 on 10/20.  Creatinine 1.05 today.  PUD (peptic ulcer disease) On Protonix.  Can go back to omeprazole as outpatient.  Essential hypertension On Toprol  Chronic respiratory failure with hypoxia (HCC) Continue chronic oxygen 2 L  Depression with anxiety On Lamictal, Cymbalta and gabapentin  Protein-calorie malnutrition, moderate (HCC) Continue supplements         Consultants: Orthopedic surgery Procedures performed: 10/19 left hip nailing procedure Disposition: Rehabilitation facility Diet recommendation:  Cardiac diet DISCHARGE MEDICATION: Allergies as of 05/02/2023       Reactions   Penicillins Swelling, Rash   Has patient had a PCN reaction causing immediate rash, facial/tongue/throat swelling, SOB or lightheadedness with hypotension: Yes Has patient had a PCN reaction causing severe rash involving mucus membranes or skin necrosis: No Has patient had a PCN reaction that required hospitalization: No Has patient had a PCN reaction occurring within the last 10 years: No If all of the above answers are "NO", then may proceed with Cephalosporin use.        Medication List     STOP taking these medications    nystatin 100000 UNIT/ML suspension Commonly known as: MYCOSTATIN   OXYGEN  Senexon-S 8.6-50 MG tablet Generic drug: senna-docusate       TAKE these medications    acetaminophen 325 MG tablet Commonly known as: TYLENOL Take 2 tablets (650 mg total) by mouth every 6 (six) hours as needed for mild pain (pain score 1-3),  fever or moderate pain (pain score 4-6).   albuterol (2.5 MG/3ML) 0.083% nebulizer solution Commonly known as: PROVENTIL Take 2.5 mg by nebulization every 6 (six) hours as needed for wheezing.   albuterol 108 (90 Base) MCG/ACT inhaler Commonly known as: VENTOLIN HFA Inhale 2 puffs into the lungs every 6 (six) hours as needed for wheezing or shortness of breath.   cyanocobalamin 1000 MCG tablet Take 1 tablet (1,000 mcg total) by mouth daily. Start taking on: May 03, 2023   DULoxetine 30 MG capsule Commonly known as: CYMBALTA Take 1 capsule (30 mg total) by mouth 2 (two) times daily.   enoxaparin 30 MG/0.3ML injection Commonly known as: LOVENOX Inject 0.3 mLs (30 mg total) into the skin daily for 14 days. Start taking on: May 03, 2023   feeding supplement Liqd Take 237 mLs by mouth 2 (two) times daily between meals.   ferrous sulfate 325 (65 FE) MG tablet Take 1 tablet (325 mg total) by mouth daily with breakfast. Start taking on: May 03, 2023   gabapentin 300 MG capsule Commonly known as: NEURONTIN Take 1 capsule (300 mg total) by mouth 3 (three) times daily.   lamoTRIgine 100 MG tablet Commonly known as: LAMICTAL Take 1 tablet (100 mg total) by mouth daily.   loperamide 2 MG capsule Commonly known as: IMODIUM Take 1 capsule (2 mg total) by mouth as needed for diarrhea or loose stools.   metoprolol succinate 100 MG 24 hr tablet Commonly known as: TOPROL-XL Take 100 mg by mouth daily. Take with or immediately following a meal.   Multi-Vitamins Tabs Take 1 tablet by mouth daily.   nicotine 21 mg/24hr patch Commonly known as: NICODERM CQ - dosed in mg/24 hours Place 1 patch (21 mg total) onto the skin daily. Start taking on: May 03, 2023   omeprazole 20 MG capsule Commonly known as: PRILOSEC Take 40 mg by mouth every morning.   oxyCODONE-acetaminophen 5-325 MG tablet Commonly known as: PERCOCET/ROXICET Take 1 tablet by mouth every 6 (six) hours  as needed for up to 4 days for severe pain (pain score 7-10).   predniSONE 10 MG tablet Commonly known as: DELTASONE 4 tabs po day 1; 3 tabs po day 2,3; 2 tabs po day 4,5; 1 tab po day 6,7; 1/2 tab po day 8,9.   QUEtiapine 25 MG tablet Commonly known as: SEROQUEL Take 25 mg by mouth at bedtime as needed (sleep).   senna 8.6 MG Tabs tablet Commonly known as: SENOKOT Take 1 tablet (8.6 mg total) by mouth 2 (two) times daily.   Trelegy Ellipta 100-62.5-25 MCG/INH Aepb Generic drug: Fluticasone-Umeclidin-Vilant Inhale 1 puff into the lungs daily.        Contact information for follow-up providers     Deeann Saint, MD Follow up in 10 day(s).   Specialty: Orthopedic Surgery Contact information: [address] [place] [address] [phone]              Contact information for after-discharge care     Destination     [facility] [address] [place] [facility] Preferred SNF .   Service: Skilled Paramedic information: [address] [place] [address] [phone]  Discharge Exam: Filed Weights   04/25/23 1726 04/28/23 1451 05/01/23 0524  Weight: 59 kg 59.2 kg 59.2 kg   Physical Exam HENT:     Head: Normocephalic.     Mouth/Throat:     Pharynx: No oropharyngeal exudate.  Eyes:     General: Lids are normal.     Conjunctiva/sclera: Conjunctivae normal.  Cardiovascular:     Rate and Rhythm: Normal rate and regular rhythm.     Heart sounds: Normal heart sounds, S1 normal and S2 normal.  Pulmonary:     Breath sounds: Examination of the right-lower field reveals decreased breath sounds and wheezing. Examination of the left-lower field reveals decreased breath sounds. Decreased breath sounds and wheezing present. No rhonchi or rales.  Abdominal:     Palpations: Abdomen is soft.     Tenderness: There is no abdominal tenderness.  Musculoskeletal:     Right lower leg: No swelling.     Left lower leg: No  swelling.  Skin:    General: Skin is warm.     Findings: No rash.  Neurological:     Mental Status: She is alert and oriented to person, place, and time.     Comments: Unable to straight leg raise very well with her left leg.  Able to straight leg raise with the right leg.      Condition at discharge: stable  The results of significant diagnostics from this hospitalization (including imaging, microbiology, ancillary and laboratory) are listed below for reference.   Imaging Studies: DG HIP UNILAT WITH PELVIS 2-3 VIEWS LEFT  Result Date: 04/26/2023 CLINICAL DATA:  Left femoral nail EXAM: DG HIP (WITH OR WITHOUT PELVIS) 2V LEFT COMPARISON:  Hip radiograph dated April 25, 2023 FINDINGS: Fluoroscopic images were obtained intraoperatively and submitted for post operative interpretation. Left femoral intramedullary nail placement with hardware in expected position, 5 images were obtained with 39 seconds of fluoroscopy time and 6.61 mGy. Please see the performing provider's procedural report for further detail. IMPRESSION: Fluoroscopic images were obtained intraoperatively for left femoral intramedullary nail placement. Electronically Signed   By: Allegra Lai M.D.   On: 04/26/2023 14:32   DG C-Arm 1-60 Min-No Report  Result Date: 04/26/2023 Fluoroscopy was utilized by the requesting physician.  No radiographic interpretation.   CT Angio Chest Pulmonary Embolism (PE) W or WO Contrast  Result Date: 04/25/2023 CLINICAL DATA:  Recent fall with left-sided chest pain, initial encounter EXAM: CT ANGIOGRAPHY CHEST WITH CONTRAST TECHNIQUE: Multidetector CT imaging of the chest was performed using the standard protocol during bolus administration of intravenous contrast. Multiplanar CT image reconstructions and MIPs were obtained to evaluate the vascular anatomy. RADIATION DOSE REDUCTION: This exam was performed according to the departmental dose-optimization program which includes automated  exposure control, adjustment of the mA and/or kV according to patient size and/or use of iterative reconstruction technique. CONTRAST:  75mL OMNIPAQUE IOHEXOL 350 MG/ML SOLN COMPARISON:  Chest x-ray from earlier the same day, 10/02/2022. FINDINGS: Cardiovascular: Atherosclerotic calcifications of the thoracic aorta are noted. No aneurysmal dilatation is seen. No cardiac enlargement is noted. The pulmonary artery is well visualized within normal branching pattern bilaterally. No filling defect to suggest pulmonary embolism is noted. Mediastinum/Nodes: Thoracic inlet is within normal limits. The esophagus as visualized is unremarkable. No hilar or mediastinal adenopathy is noted. Scattered calcifications are noted in the hilar nodes likely related to prior granulomatous disease. Lungs/Pleura: Emphysematous changes are seen similar to that noted on the prior exam. Cavitary lesions are seen in the  left upper lobe and lingula stable from the prior exam. No parenchymal nodule is seen. No effusion or focal infiltrate is seen. Upper Abdomen: No acute abnormality. Musculoskeletal: Degenerative changes of the thoracic spine are noted. No acute rib abnormality is noted. Scoliosis concave to the left is noted in the thoracic spine. Review of the MIP images confirms the above findings. IMPRESSION: No evidence of pulmonary emboli. Chronic emphysematous changes with cavitary lesion stable from the prior exam. No acute rib abnormality is noted. Aortic Atherosclerosis (ICD10-I70.0) and Emphysema (ICD10-J43.9). Electronically Signed   By: Alcide Clever M.D.   On: 04/25/2023 23:32   DG Knee Complete 4 Views Left  Result Date: 04/25/2023 CLINICAL DATA:  Fall on left side, left hip pain. EXAM: LEFT KNEE - COMPLETE 4+ VIEW COMPARISON:  None Available. FINDINGS: No evidence of fracture, dislocation, or joint effusion. The alignment and joint spaces are preserved. There is chondrocalcinosis. No evidence of arthropathy or other focal  bone abnormality. Soft tissues are unremarkable. IMPRESSION: 1. No fracture or dislocation of the left knee. 2. Chondrocalcinosis. Electronically Signed   By: Narda Rutherford M.D.   On: 04/25/2023 18:54   DG Chest Port 1 View  Result Date: 04/25/2023 CLINICAL DATA:  Shortness of breath.  Fall on left side. EXAM: PORTABLE CHEST 1 VIEW COMPARISON:  Radiograph 07/15/2022, CT 10/02/2022 FINDINGS: Emphysema with chronic left perihilar scarring. Previous cavitary lesion in the left lung on prior CT is less well-defined on the current exam. Chronic subpleural reticular markings at the right lung base. There is no acute airspace disease, pleural effusion, or pneumothorax. The heart is normal in size. Prominent thoracic scoliosis. IMPRESSION: 1. No acute abnormality. 2. Chronic lung disease. Emphysema with chronic left perihilar scarring. Electronically Signed   By: Narda Rutherford M.D.   On: 04/25/2023 18:54   DG Hip Unilat W or Wo Pelvis 2-3 Views Left  Result Date: 04/25/2023 CLINICAL DATA:  Fall with left hip pain. EXAM: DG HIP (WITH OR WITHOUT PELVIS) 2-3V LEFT COMPARISON:  None Available. FINDINGS: Displaced intertrochanteric left proximal femur fracture. The femoral head is well seated, no hip dislocation. Intact bony pelvis, including pubic rami. No pubic symphyseal or sacroiliac diastasis. IMPRESSION: Displaced intertrochanteric left proximal femur fracture. Electronically Signed   By: Narda Rutherford M.D.   On: 04/25/2023 18:51    Microbiology: Results for orders placed or performed during the hospital encounter of 04/25/23  SARS Coronavirus 2 by RT PCR (hospital order, performed in [facility] hospital lab) *cepheid single result test* Anterior Nasal Swab     Status: None   Collection Time: 04/26/23  1:39 AM   Specimen: Anterior Nasal Swab  Result Value Ref Range Status   SARS Coronavirus 2 by RT PCR NEGATIVE NEGATIVE Final    Comment: (NOTE) SARS-CoV-2 target nucleic acids are NOT  DETECTED.  The SARS-CoV-2 RNA is generally detectable in upper and lower respiratory specimens during the acute phase of infection. The lowest concentration of SARS-CoV-2 viral copies this assay can detect is 250 copies / mL. A negative result does not preclude SARS-CoV-2 infection and should not be used as the sole basis for treatment or other patient management decisions.  A negative result may occur with improper specimen collection / handling, submission of specimen other than nasopharyngeal swab, presence of viral mutation(s) within the areas targeted by this assay, and inadequate number of viral copies (<250 copies / mL). A negative result must be combined with clinical observations, patient history, and epidemiological information.  Fact  Sheet for Patients:   RoadLapTop.co.za  Fact Sheet for Healthcare Providers: http://kim-miller.com/  This test is not yet approved or  cleared by the [place] FDA and has been authorized for detection and/or diagnosis of SARS-CoV-2 by FDA under an Emergency Use Authorization (EUA).  This EUA will remain in effect (meaning this test can be used) for the duration of the COVID-19 declaration under Section 564(b)(1) of the Act, 21 U.S.C. section 360bbb-3(b)(1), unless the authorization is terminated or revoked sooner.  Performed at [facility], [address] [place], [place] [address]   Expectorated Sputum Assessment w Gram Stain, Rflx to Resp Cult     Status: None   Collection Time: 04/27/23  9:41 AM   Specimen: Sputum  Result Value Ref Range Status   Specimen Description SPUTUM  Final   Special Requests SPUTUM  Final   Sputum evaluation   Final    THIS SPECIMEN IS ACCEPTABLE FOR SPUTUM CULTURE Performed at [facility], [address], [place], [place] [address]    Report Status 04/27/2023 FINAL  Final  Culture, Respiratory w Gram Stain     Status: None    Collection Time: 04/27/23  9:41 AM   Specimen: SPU  Result Value Ref Range Status   Specimen Description   Final    SPUTUM Performed at [facility], [address] [place], [place] [address]    Special Requests   Final    SPUTUM Reflexed from [phone] Performed at [facility], [address] [place], [place] [address]    Gram Stain   Final    FEW WBC PRESENT, PREDOMINANTLY PMN MODERATE GRAM NEGATIVE RODS Performed at [facility], [address], [place], [place] [address]    Culture MODERATE PSEUDOMONAS AERUGINOSA  Final   Report Status 04/29/2023 FINAL  Final   Organism ID, Bacteria PSEUDOMONAS AERUGINOSA  Final      Susceptibility   Pseudomonas aeruginosa - MIC*    CEFTAZIDIME 16 INTERMEDIATE Intermediate     CIPROFLOXACIN <=0.25 SENSITIVE Sensitive     GENTAMICIN 4 SENSITIVE Sensitive     IMIPENEM >=16 RESISTANT Resistant     PIP/TAZO 8 SENSITIVE Sensitive ug/mL    CEFEPIME RESISTANT Resistant     * MODERATE PSEUDOMONAS AERUGINOSA    Labs: CBC: Recent Labs  Lab 04/28/23 0556 04/29/23 0527 04/30/23 0526 05/01/23 0325 05/02/23 0500  WBC 12.9* 10.8* 8.5 9.8 11.1*  HGB 8.1* 8.1* 7.9* 7.8* 10.2*  HCT 24.5* 25.3* 23.6* 23.7* 30.5*  MCV 101.2* 104.1* 101.7* 100.4* 97.1  PLT 214 213 230 274 326   Basic Metabolic Panel: Recent Labs  Lab 04/27/23 0533 04/27/23 1233 04/28/23 0556 04/29/23 0527 04/29/23 1336 04/30/23 0526 05/01/23 0325 05/02/23 0500  NA 125*   < > 129* 135  --  132* 131* 134*  K 4.5  --  4.9 5.9* 5.1 5.5* 4.2 4.6  CL 89*  --  97* 100  --  97* 95* 95*  CO2 24  --  26 29  --  29 29 32  GLUCOSE 197*  --  92 91  --  94 92 72  BUN 38*  --  52* 51*  --  45* 41* 40*  CREATININE 1.78*  --  1.60* 1.26*  --  1.10* 1.10* 1.05*  CALCIUM 8.7*  --  8.8* 9.0  --  8.8* 8.6* 8.9  MG 1.9  --  2.2  --   --   --   --   --    < > =  values in this interval not displayed.   Liver Function Tests: No results for input(s): "AST", "ALT",  "ALKPHOS", "BILITOT", "PROT", "ALBUMIN" in the last 168 hours. CBG: Recent Labs  Lab 04/25/23 2036  GLUCAP 179*    Discharge time spent: greater than 30 minutes.  Signed: Alford Highland, MD [facility] 05/02/2023

## 2023-05-02 NOTE — Progress Notes (Addendum)
1404 Report given to Alessandra Bevels L[facility] ([facility]). Transport set to arrive between 1630-1730. Pt made aware   1651 Transport arrived to [facility] to [facility] at this time.

## 2023-05-26 LAB — CULTURE, RESPIRATORY W GRAM STAIN

## 2024-03-10 ENCOUNTER — Other Ambulatory Visit: Payer: Self-pay | Admitting: Specialist

## 2024-03-10 DIAGNOSIS — R918 Other nonspecific abnormal finding of lung field: Secondary | ICD-10-CM

## 2024-03-10 DIAGNOSIS — J449 Chronic obstructive pulmonary disease, unspecified: Secondary | ICD-10-CM

## 2024-03-26 ENCOUNTER — Ambulatory Visit: Admission: RE | Admit: 2024-03-26 | Source: Ambulatory Visit

## 2024-03-29 ENCOUNTER — Ambulatory Visit

## 2024-04-05 NOTE — Progress Notes (Addendum)
 Established Patient Visit   Chief Complaint: After cardiac testing  Date of Service: 04/05/2024 Date of Birth: 1956-06-17 PCP: Glover Alm Hail, MD  History of Present Illness: Ms. Dalgleish is a 68 y.o.female patient who presented after cardiac testing  Past medical history significant for severe COPD on home O2 3 L, hypertension, history of breast/lung cancer, chronic back pain issues.  With symptoms of increased shortness of breath, she had echo/stress test.  Echocardiogram 03/2024 with normal biventricular systolic function, LVEF greater than 55% with no significant valvular abnormality.  Stress test 03/2024 normal with no ischemia.  Patient with limited baseline functional status, who presented in wheelchair with her husband.  Has chronic exertional dyspnea from severe COPD.  Wears oxygen  at home.  No chest pain/pressure.  Pedal edema has resolved.  Past Medical and Surgical History  Past Medical History Past Medical History:  Diagnosis Date  . Breast cancer (CMS/HHS-HCC)    right breast, ER/PR, December 2012. s/p chemo and radiation  . Carcinoma of overlapping sites of right breast in female, estrogen receptor positive (CMS/HHS-HCC) 04/18/2016   Last Assessment & Plan:  . COPD (chronic obstructive pulmonary disease) (CMS/HHS-HCC)   . Endometriosis   . Hepatitis C 07/08/1996   [facility] trial treatment in-patient study 2005. HCV 740, 05/20/11.  SABRA Neuropathy    hands and feet  . Obesity   . Polysubstance abuse (CMS/HHS-HCC)    Heroine, cocaine, marijuana. States all of them. Heavy use 1995 to 2000. Occasional use before and after.  None since 2006.   SABRA Severe chronic obstructive pulmonary disease (CMS/HHS-HCC) 11/16/2013  . Skin cancer of face 07/08/2001    Past Surgical History She has a past surgical history that includes Hysterectomy Vaginal (07/09/1999); Tubal ligation (07/08/1994); Upper right leg benign tumor removed; Mastectomy (06/08/2011); Right breast surgery (07/09/2011);  and BREAST LUMPECTOMY (Right, 2013).   Medications and Allergies  Current Medications  Current Outpatient Medications  Medication Sig Dispense Refill  . albuterol  (PROVENTIL ) 2.5 mg /3 mL (0.083 %) nebulizer solution USE 1 VIAL VIA NEBULIZER EVERY 6 HOURS AS NEEDED FOR WHEEZING 975 mL 3  . albuterol  MDI, PROVENTIL , VENTOLIN , PROAIR , HFA 90 mcg/actuation inhaler Inhale 2 inhalations into the lungs every 6 (six) hours as needed 54 each 3  . DULoxetine  (CYMBALTA ) 30 MG DR capsule Take 1 capsule (30 mg total) by mouth 2 (two) times daily 180 capsule 1  . fluticasone  propionate (FLONASE) 50 mcg/actuation nasal spray SPRAY 2 SPRAYS INTO EACH NOSTRIL EVERY DAY 48 g 1  . FUROsemide  (LASIX ) 20 MG tablet Take 1 tablet (20 mg total) by mouth once daily 30 tablet 11  . gabapentin  (NEURONTIN ) 300 MG capsule TAKE 1 CAPSULE BY MOUTH 3 TIMES  DAILY 300 capsule 2  . lamoTRIgine  (LAMICTAL ) 100 MG tablet TAKE 1 TABLET BY MOUTH ONCE  DAILY 90 tablet 3  . levothyroxine (SYNTHROID) 50 MCG tablet Take 1 tablet (50 mcg total) by mouth once daily Take on an empty stomach with a glass of water at least 30-60 minutes before breakfast. 90 tablet 1  . metoprolol  SUCCinate (TOPROL -XL) 100 MG XL tablet Take 0.5 tablets (50 mg total) by mouth once daily    . multivitamin tablet Take 1 tablet by mouth once daily.    . omeprazole  (PRILOSEC ) 20 MG DR capsule Take 40 mg by mouth every morning    . OXYGEN -AIR DELIVERY SYSTEMS MISC Inhale 2 L into the lungs    . predniSONE  (DELTASONE ) 5 MG tablet TAKE 1 TABLET BY MOUTH  EVERY DAY 30 tablet 1  . QUEtiapine  (SEROQUEL ) 25 MG tablet TAKE 1 TABLET BY MOUTH ONCE  DAILY AS NEEDED 90 tablet 3  . TRELEGY ELLIPTA 100-62.5-25 mcg inhaler USE 1 INHALATION BY MOUTH ONCE  DAILY AT THE SAME TIME EACH DAY 180 each 3   No current facility-administered medications for this visit.    Allergies: Penicillins  Social and Family History  Social History  reports that she has been smoking  cigarettes. She started smoking about 53 years ago. She has a 50 pack-year smoking history. She has never used smokeless tobacco. She reports that she does not currently use alcohol. She reports that she does not use drugs.  Family History Family History  Problem Relation Name Age of Onset  . Heart disease Mother    . High blood pressure (Hypertension) Mother    . Lung cancer Mother    . Skin cancer Mother    . Lung cancer Father      Review of Systems   Review of Systems:  Chronic shortness of breath  Physical Examination   Vitals:BP 120/66   Pulse 72   Ht 170.2 cm (5' 7)   Wt 51.7 kg (114 lb)   BMI 17.85 kg/m  Ht:170.2 cm (5' 7) Wt:51.7 kg (114 lb) ADJ:Anib surface area is 1.56 meters squared. Body mass index is 17.85 kg/m.  HEENT: Pupils equally reactive to light and accomodation  Neck: Supple, no significant JVD Lungs: Decreased bilateral breath sound Heart: Regular rate and rhythm. No murmur Extremities: no pedal edema  Assessment and Plan   68 y.o. female with  Chronic shortness of breath, related to COPD Pedal edema, resolved Severe COPD on home O2 Tobacco abuse, continues to smoke  Recent cardiac evaluation including stress test with no ischemia.  Echo with normal biventricular systolic function and no significant valvular abnormality. Continue Lasix  as needed for pedal edema. Her shortness of breath symptoms are primarily related to COPD, she follows with pulmonology. Continues to smoke, counseled on smoking cessation  No orders of the defined types were placed in this encounter.   Return in about 1 year (around 04/05/2025).  KRISHNA CHAITANYA ALLURI, MD  This dictation was prepared with dragon dictation. Any transcription errors that result from this process are unintentional.

## 2024-04-13 ENCOUNTER — Emergency Department

## 2024-04-13 ENCOUNTER — Inpatient Hospital Stay
Admission: EM | Admit: 2024-04-13 | Discharge: 2024-04-18 | DRG: 871 | Disposition: A | Discharge status: Home Health | Attending: Student | Admitting: Student

## 2024-04-13 ENCOUNTER — Other Ambulatory Visit: Payer: Self-pay

## 2024-04-13 DIAGNOSIS — Z808 Family history of malignant neoplasm of other organs or systems: Secondary | ICD-10-CM

## 2024-04-13 DIAGNOSIS — I110 Hypertensive heart disease with heart failure: Secondary | ICD-10-CM | POA: Diagnosis present

## 2024-04-13 DIAGNOSIS — F419 Anxiety disorder, unspecified: Secondary | ICD-10-CM | POA: Diagnosis present

## 2024-04-13 DIAGNOSIS — J441 Chronic obstructive pulmonary disease with (acute) exacerbation: Secondary | ICD-10-CM | POA: Diagnosis present

## 2024-04-13 DIAGNOSIS — I5032 Chronic diastolic (congestive) heart failure: Secondary | ICD-10-CM | POA: Diagnosis present

## 2024-04-13 DIAGNOSIS — J9621 Acute and chronic respiratory failure with hypoxia: Secondary | ICD-10-CM | POA: Diagnosis present

## 2024-04-13 DIAGNOSIS — J189 Pneumonia, unspecified organism: Secondary | ICD-10-CM | POA: Diagnosis present

## 2024-04-13 DIAGNOSIS — A419 Sepsis, unspecified organism: Secondary | ICD-10-CM | POA: Diagnosis not present

## 2024-04-13 DIAGNOSIS — G629 Polyneuropathy, unspecified: Secondary | ICD-10-CM | POA: Diagnosis present

## 2024-04-13 DIAGNOSIS — E871 Hypo-osmolality and hyponatremia: Secondary | ICD-10-CM | POA: Diagnosis present

## 2024-04-13 DIAGNOSIS — Z90710 Acquired absence of both cervix and uterus: Secondary | ICD-10-CM

## 2024-04-13 DIAGNOSIS — Z853 Personal history of malignant neoplasm of breast: Secondary | ICD-10-CM

## 2024-04-13 DIAGNOSIS — Z85118 Personal history of other malignant neoplasm of bronchus and lung: Secondary | ICD-10-CM

## 2024-04-13 DIAGNOSIS — F32A Depression, unspecified: Secondary | ICD-10-CM | POA: Diagnosis present

## 2024-04-13 DIAGNOSIS — J440 Chronic obstructive pulmonary disease with acute lower respiratory infection: Secondary | ICD-10-CM | POA: Diagnosis present

## 2024-04-13 DIAGNOSIS — Z833 Family history of diabetes mellitus: Secondary | ICD-10-CM

## 2024-04-13 DIAGNOSIS — F17290 Nicotine dependence, other tobacco product, uncomplicated: Secondary | ICD-10-CM | POA: Diagnosis present

## 2024-04-13 DIAGNOSIS — D509 Iron deficiency anemia, unspecified: Secondary | ICD-10-CM | POA: Diagnosis present

## 2024-04-13 DIAGNOSIS — E039 Hypothyroidism, unspecified: Secondary | ICD-10-CM | POA: Diagnosis present

## 2024-04-13 DIAGNOSIS — Z880 Allergy status to penicillin: Secondary | ICD-10-CM

## 2024-04-13 DIAGNOSIS — Z23 Encounter for immunization: Secondary | ICD-10-CM | POA: Diagnosis present

## 2024-04-13 DIAGNOSIS — G4700 Insomnia, unspecified: Secondary | ICD-10-CM | POA: Diagnosis present

## 2024-04-13 DIAGNOSIS — E875 Hyperkalemia: Secondary | ICD-10-CM | POA: Diagnosis present

## 2024-04-13 DIAGNOSIS — Z8619 Personal history of other infectious and parasitic diseases: Secondary | ICD-10-CM

## 2024-04-13 DIAGNOSIS — Z1152 Encounter for screening for COVID-19: Secondary | ICD-10-CM

## 2024-04-13 DIAGNOSIS — J479 Bronchiectasis, uncomplicated: Secondary | ICD-10-CM | POA: Diagnosis present

## 2024-04-13 DIAGNOSIS — N179 Acute kidney failure, unspecified: Secondary | ICD-10-CM | POA: Diagnosis present

## 2024-04-13 DIAGNOSIS — Z79899 Other long term (current) drug therapy: Secondary | ICD-10-CM

## 2024-04-13 DIAGNOSIS — Z801 Family history of malignant neoplasm of trachea, bronchus and lung: Secondary | ICD-10-CM

## 2024-04-13 DIAGNOSIS — J439 Emphysema, unspecified: Secondary | ICD-10-CM | POA: Diagnosis present

## 2024-04-13 DIAGNOSIS — J9801 Acute bronchospasm: Secondary | ICD-10-CM

## 2024-04-13 DIAGNOSIS — L89151 Pressure ulcer of sacral region, stage 1: Secondary | ICD-10-CM | POA: Diagnosis present

## 2024-04-13 DIAGNOSIS — R6520 Severe sepsis without septic shock: Secondary | ICD-10-CM | POA: Diagnosis present

## 2024-04-13 DIAGNOSIS — Z7952 Long term (current) use of systemic steroids: Secondary | ICD-10-CM

## 2024-04-13 DIAGNOSIS — Z8249 Family history of ischemic heart disease and other diseases of the circulatory system: Secondary | ICD-10-CM

## 2024-04-13 LAB — CBC WITH DIFFERENTIAL/PLATELET
Abs Immature Granulocytes: 0.17 K/uL — ABNORMAL HIGH (ref 0.00–0.07)
Basophils Absolute: 0.1 K/uL (ref 0.0–0.1)
Basophils Relative: 0 %
Eosinophils Absolute: 0 K/uL (ref 0.0–0.5)
Eosinophils Relative: 0 %
HCT: 33.9 % — ABNORMAL LOW (ref 36.0–46.0)
Hemoglobin: 11.5 g/dL — ABNORMAL LOW (ref 12.0–15.0)
Immature Granulocytes: 1 %
Lymphocytes Relative: 2 %
Lymphs Abs: 0.6 K/uL — ABNORMAL LOW (ref 0.7–4.0)
MCH: 34 pg (ref 26.0–34.0)
MCHC: 33.9 g/dL (ref 30.0–36.0)
MCV: 100.3 fL — ABNORMAL HIGH (ref 80.0–100.0)
Monocytes Absolute: 1.6 K/uL — ABNORMAL HIGH (ref 0.1–1.0)
Monocytes Relative: 6 %
Neutro Abs: 25.9 K/uL — ABNORMAL HIGH (ref 1.7–7.7)
Neutrophils Relative %: 91 %
Platelets: 368 K/uL (ref 150–400)
RBC: 3.38 MIL/uL — ABNORMAL LOW (ref 3.87–5.11)
RDW: 13.5 % (ref 11.5–15.5)
Smear Review: NORMAL
WBC: 28.4 K/uL — ABNORMAL HIGH (ref 4.0–10.5)
nRBC: 0 % (ref 0.0–0.2)

## 2024-04-13 LAB — BLOOD GAS, VENOUS
Acid-Base Excess: 11.4 mmol/L — ABNORMAL HIGH (ref 0.0–2.0)
Bicarbonate: 38.3 mmol/L — ABNORMAL HIGH (ref 20.0–28.0)
O2 Saturation: 34 %
Patient temperature: 37
pCO2, Ven: 59 mmHg (ref 44–60)
pH, Ven: 7.42 (ref 7.25–7.43)

## 2024-04-13 LAB — COMPREHENSIVE METABOLIC PANEL WITH GFR
ALT: 13 U/L (ref 0–44)
AST: 31 U/L (ref 15–41)
Albumin: 3.4 g/dL — ABNORMAL LOW (ref 3.5–5.0)
Alkaline Phosphatase: 137 U/L — ABNORMAL HIGH (ref 38–126)
Anion gap: 12 (ref 5–15)
BUN: 21 mg/dL (ref 8–23)
CO2: 30 mmol/L (ref 22–32)
Calcium: 9.3 mg/dL (ref 8.9–10.3)
Chloride: 87 mmol/L — ABNORMAL LOW (ref 98–111)
Creatinine, Ser: 0.82 mg/dL (ref 0.44–1.00)
GFR, Estimated: >60 mL/min (ref >60)
Glucose, Bld: 100 mg/dL — ABNORMAL HIGH (ref 70–99)
Potassium: 4.4 mmol/L (ref 3.5–5.1)
Sodium: 129 mmol/L — ABNORMAL LOW (ref 135–145)
Total Bilirubin: 0.9 mg/dL (ref 0.0–1.2)
Total Protein: 8.2 g/dL — ABNORMAL HIGH (ref 6.5–8.1)

## 2024-04-13 LAB — BRAIN NATRIURETIC PEPTIDE: B Natriuretic Peptide: 127.3 pg/mL — ABNORMAL HIGH (ref 0.0–100.0)

## 2024-04-13 LAB — RESP PANEL BY RT-PCR (RSV, FLU A&B, COVID)  RVPGX2
Influenza A by PCR: NEGATIVE
Influenza B by PCR: NEGATIVE
Resp Syncytial Virus by PCR: NEGATIVE
SARS Coronavirus 2 by RT PCR: NEGATIVE

## 2024-04-13 LAB — MAGNESIUM: Magnesium: 1.8 mg/dL (ref 1.7–2.4)

## 2024-04-13 LAB — TROPONIN I (HIGH SENSITIVITY): Troponin I (High Sensitivity): 14 ng/L (ref <18)

## 2024-04-13 MED ORDER — LEVOFLOXACIN IN D5W 750 MG/150ML IV SOLN
750.0000 mg | Freq: Once | INTRAVENOUS | Status: AC
  Administered 2024-04-13: 750 mg via INTRAVENOUS
  Filled 2024-04-13: qty 150

## 2024-04-13 MED ORDER — SODIUM CHLORIDE 0.9 % IV SOLN
500.0000 mg | INTRAVENOUS | Status: DC
  Administered 2024-04-14 – 2024-04-15 (×2): 500 mg via INTRAVENOUS
  Filled 2024-04-13 (×3): qty 5

## 2024-04-13 MED ORDER — GUAIFENESIN ER 600 MG PO TB12
600.0000 mg | ORAL_TABLET | Freq: Two times a day (BID) | ORAL | Status: DC
  Administered 2024-04-13 – 2024-04-18 (×10): 600 mg via ORAL
  Filled 2024-04-13 (×10): qty 1

## 2024-04-13 MED ORDER — SORBITOL 70 % SOLN: 30.0000 mL | Freq: Every day | Status: DC | PRN

## 2024-04-13 MED ORDER — IPRATROPIUM-ALBUTEROL 0.5-2.5 (3) MG/3ML IN SOLN
3.0000 mL | Freq: Four times a day (QID) | RESPIRATORY_TRACT | Status: DC
  Administered 2024-04-13 – 2024-04-17 (×13): 3 mL via RESPIRATORY_TRACT
  Filled 2024-04-13 (×14): qty 3

## 2024-04-13 MED ORDER — ONDANSETRON HCL 4 MG/2ML IJ SOLN: 4.0000 mg | Freq: Four times a day (QID) | INTRAMUSCULAR | Status: DC | PRN

## 2024-04-13 MED ORDER — LAMOTRIGINE 100 MG PO TABS
100.0000 mg | ORAL_TABLET | Freq: Every day | ORAL | Status: DC
  Administered 2024-04-14 – 2024-04-18 (×5): 100 mg via ORAL
  Filled 2024-04-13 (×5): qty 1

## 2024-04-13 MED ORDER — SODIUM CHLORIDE 0.9 % IV BOLUS (SEPSIS)
1000.0000 mL | Freq: Once | INTRAVENOUS | Status: AC
  Administered 2024-04-13: 1000 mL via INTRAVENOUS

## 2024-04-13 MED ORDER — LEVALBUTEROL HCL 0.63 MG/3ML IN NEBU
0.6300 mg | INHALATION_SOLUTION | RESPIRATORY_TRACT | Status: DC | PRN
  Administered 2024-04-17: 0.63 mg via RESPIRATORY_TRACT
  Filled 2024-04-13 (×2): qty 3

## 2024-04-13 MED ORDER — GABAPENTIN 300 MG PO CAPS
300.0000 mg | ORAL_CAPSULE | Freq: Three times a day (TID) | ORAL | Status: DC
  Administered 2024-04-13 – 2024-04-18 (×14): 300 mg via ORAL
  Filled 2024-04-13 (×14): qty 1

## 2024-04-13 MED ORDER — QUETIAPINE FUMARATE 25 MG PO TABS
25.0000 mg | ORAL_TABLET | Freq: Every evening | ORAL | Status: DC | PRN
  Administered 2024-04-14 – 2024-04-17 (×4): 25 mg via ORAL
  Filled 2024-04-13 (×4): qty 1

## 2024-04-13 MED ORDER — METHYLPREDNISOLONE SODIUM SUCC 125 MG IJ SOLR
60.0000 mg | Freq: Two times a day (BID) | INTRAMUSCULAR | Status: DC
  Administered 2024-04-14 – 2024-04-15 (×3): 60 mg via INTRAVENOUS
  Filled 2024-04-13 (×4): qty 2

## 2024-04-13 MED ORDER — ACETAMINOPHEN 650 MG RE SUPP: 650.0000 mg | Freq: Four times a day (QID) | RECTAL | Status: DC | PRN

## 2024-04-13 MED ORDER — ACETAMINOPHEN 325 MG PO TABS
650.0000 mg | ORAL_TABLET | Freq: Four times a day (QID) | ORAL | Status: DC | PRN
  Administered 2024-04-14 – 2024-04-18 (×4): 650 mg via ORAL
  Filled 2024-04-13 (×5): qty 2

## 2024-04-13 MED ORDER — POLYETHYLENE GLYCOL 3350 17 G PO PACK: 17.0000 g | PACK | Freq: Every day | ORAL | Status: DC | PRN

## 2024-04-13 MED ORDER — DULOXETINE HCL 30 MG PO CPEP
30.0000 mg | ORAL_CAPSULE | Freq: Two times a day (BID) | ORAL | Status: DC
  Administered 2024-04-13 – 2024-04-18 (×10): 30 mg via ORAL
  Filled 2024-04-13 (×11): qty 1

## 2024-04-13 MED ORDER — FUROSEMIDE 20 MG PO TABS
20.0000 mg | ORAL_TABLET | Freq: Every day | ORAL | Status: DC
  Administered 2024-04-14 – 2024-04-18 (×5): 20 mg via ORAL
  Filled 2024-04-13 (×5): qty 1

## 2024-04-13 MED ORDER — METOPROLOL SUCCINATE ER 50 MG PO TB24: 100.0000 mg | ORAL_TABLET | Freq: Every day | ORAL | Status: DC

## 2024-04-13 MED ORDER — METOPROLOL SUCCINATE ER 50 MG PO TB24
100.0000 mg | ORAL_TABLET | Freq: Every day | ORAL | Status: DC
  Administered 2024-04-14 – 2024-04-18 (×5): 100 mg via ORAL
  Filled 2024-04-13: qty 1
  Filled 2024-04-13: qty 2
  Filled 2024-04-13: qty 1
  Filled 2024-04-13 (×2): qty 2
  Filled 2024-04-13: qty 1

## 2024-04-13 MED ORDER — CEFTRIAXONE SODIUM 2 G IJ SOLR
2.0000 g | INTRAMUSCULAR | Status: DC
  Administered 2024-04-14 – 2024-04-15 (×2): 2 g via INTRAVENOUS
  Filled 2024-04-13 (×3): qty 20

## 2024-04-13 MED ORDER — ONDANSETRON HCL 4 MG PO TABS: 4.0000 mg | ORAL_TABLET | Freq: Four times a day (QID) | ORAL | Status: DC | PRN

## 2024-04-13 MED ORDER — METHYLPREDNISOLONE SODIUM SUCC 125 MG IJ SOLR
125.0000 mg | Freq: Once | INTRAMUSCULAR | Status: AC
  Administered 2024-04-13: 125 mg via INTRAVENOUS
  Filled 2024-04-13: qty 2

## 2024-04-13 MED ORDER — ALBUTEROL SULFATE (2.5 MG/3ML) 0.083% IN NEBU
5.0000 mg/h | INHALATION_SOLUTION | Freq: Once | RESPIRATORY_TRACT | Status: AC
  Administered 2024-04-13: 5 mg/h via RESPIRATORY_TRACT
  Filled 2024-04-13: qty 6

## 2024-04-13 MED ORDER — BUDESON-GLYCOPYRROL-FORMOTEROL 160-9-4.8 MCG/ACT IN AERO
2.0000 | INHALATION_SPRAY | Freq: Two times a day (BID) | RESPIRATORY_TRACT | Status: DC
  Administered 2024-04-13 – 2024-04-18 (×9): 2 via RESPIRATORY_TRACT
  Filled 2024-04-13 (×2): qty 5.9

## 2024-04-13 MED ORDER — ENSURE ENLIVE PO LIQD
237.0000 mL | Freq: Two times a day (BID) | ORAL | Status: DC
  Administered 2024-04-14 – 2024-04-16 (×4): 237 mL via ORAL
  Filled 2024-04-13: qty 237

## 2024-04-13 MED ORDER — LEVOTHYROXINE SODIUM 50 MCG PO TABS
50.0000 ug | ORAL_TABLET | Freq: Every day | ORAL | Status: DC
  Administered 2024-04-14 – 2024-04-18 (×5): 50 ug via ORAL
  Filled 2024-04-13 (×5): qty 1

## 2024-04-13 MED ORDER — ENOXAPARIN SODIUM 40 MG/0.4ML IJ SOSY
40.0000 mg | PREFILLED_SYRINGE | INTRAMUSCULAR | Status: DC
  Administered 2024-04-13 – 2024-04-17 (×5): 40 mg via SUBCUTANEOUS
  Filled 2024-04-13 (×5): qty 0.4

## 2024-04-13 MED ORDER — LEVOFLOXACIN IN D5W 750 MG/150ML IV SOLN: 750.0000 mg | INTRAVENOUS | Status: DC

## 2024-04-13 NOTE — ED Notes (Signed)
 Pt no longer on BiPap. Transitioned to 4L [place] per protocol.

## 2024-04-13 NOTE — ED Notes (Signed)
 Husband called by this RN for pt to bring home meds in.

## 2024-04-13 NOTE — ED Notes (Signed)
 RT was at bedside. On 2L now.

## 2024-04-13 NOTE — ED Provider Notes (Signed)
 [facility] Provider Note    Event Date/Time   First MD Initiated Contact with Patient 04/13/24 1234     (approximate)   History   Shortness of Breath   HPI  Regina Bernard is a 68 y.o. female with a past medical history of COPD, past history of breast and lung cancer who presents acutely for shortness of breath on a nonrebreather via EMS.  Patient has had a worsening shortness of breath for the past 3 days.  She has been compliant with her breathing treatments.  She is normally on 4 to 5 L of oxygen .  Denies any fevers chest pain or abdominal pain.  Upon EMS arrival she was hypoxic to the 70s and was given a DuoNeb and placed on a nonrebreather.     Physical Exam   Triage Vital Signs: ED Triage Vitals  Encounter Vitals Group     BP --      Girls Systolic BP Percentile --      Girls Diastolic BP Percentile --      Boys Systolic BP Percentile --      Boys Diastolic BP Percentile --      Pulse --      Resp --      Temp --      Temp src --      SpO2 --      Weight 04/13/24 1238 109 lb (49.4 kg)     Height 04/13/24 1238 5' 3 (1.6 m)     Head Circumference --      Peak Flow --      Pain Score 04/13/24 1234 4     Pain Loc --      Pain Education --      Exclude from Growth Chart --     Most recent vital signs: Vitals:   04/13/24 1330 04/13/24 1400  BP: (!) 108/92 107/76  Pulse: (!) 131 (!) 124  Resp: (!) 34 20  Temp:    SpO2: 100% 100%    Nursing Triage Note reviewed. Vital signs reviewed and patients oxygen  saturation is hypoxic  General: Patient is thin, appears unwell Head: Normocephalic and atraumatic Eyes: Normal inspection, extraocular muscles intact, no conjunctival pallor Ear, nose, throat: Normal external exam Neck: Normal range of motion Respiratory: Patient is in respiratory distress, not moving good air bilaterally Cardiovascular: Patient is tachycardic, RR GI: Abd SNT with no guarding or rebound  Back: Normal  inspection of the back with good strength and range of motion throughout all ext Extremities: pulses intact with good cap refills, no LE pitting edema or calf tenderness Neuro: The patient is alert and oriented to person, place, and time, appropriately conversive, with 5/5 bilat UE/LE strength, no gross motor or sensory defects noted. Coordination appears to be adequate. Skin: Warm, dry, and intact Psych: Anxious mood and affect, no SI or HI  ED Results / Procedures / Treatments   Labs (all labs ordered are listed, but only abnormal results are displayed) Labs Reviewed  CBC WITH DIFFERENTIAL/PLATELET - Abnormal; Notable for the following components:      Result Value   WBC 28.4 (*)    RBC 3.38 (*)    Hemoglobin 11.5 (*)    HCT 33.9 (*)    MCV 100.3 (*)    Neutro Abs 25.9 (*)    Lymphs Abs 0.6 (*)    Monocytes Absolute 1.6 (*)    Abs Immature Granulocytes 0.17 (*)    All other  components within normal limits  COMPREHENSIVE METABOLIC PANEL WITH GFR - Abnormal; Notable for the following components:   Sodium 129 (*)    Chloride 87 (*)    Glucose, Bld 100 (*)    Total Protein 8.2 (*)    Albumin 3.4 (*)    Alkaline Phosphatase 137 (*)    All other components within normal limits  BRAIN NATRIURETIC PEPTIDE - Abnormal; Notable for the following components:   B Natriuretic Peptide 127.3 (*)    All other components within normal limits  BLOOD GAS, VENOUS - Abnormal; Notable for the following components:   Bicarbonate 38.3 (*)    Acid-Base Excess 11.4 (*)    All other components within normal limits  RESP PANEL BY RT-PCR (RSV, FLU A&B, COVID)  RVPGX2  CULTURE, BLOOD (ROUTINE X 2)  CULTURE, BLOOD (ROUTINE X 2)  EXPECTORATED SPUTUM ASSESSMENT W GRAM STAIN, RFLX TO RESP C  MAGNESIUM   LACTIC ACID, PLASMA  LACTIC ACID, PLASMA  HIV ANTIBODY (ROUTINE TESTING W REFLEX)  TROPONIN I (HIGH SENSITIVITY)  TROPONIN I (HIGH SENSITIVITY)     EKG EKG and rhythm strip are interpreted by  myself:   EKG: tachcyardic sinus rhythm at heart rate of 130, normal QRS duration, QTc 440, nonspecific ST segments and T waves no ectopy EKG not consistent with Acute STEMI Rhythm strip: tachcyardic sinus rhythym in lead II   RADIOLOGY CXR: consistent with pneumonia on my independent review interpretation    PROCEDURES:  Critical Care performed: Yes, see critical care procedure note(s)  .Critical Care  Performed by: Nicholaus Rolland BRAVO, MD Authorized by: Nicholaus Rolland BRAVO, MD   Critical care provider statement:    Critical care time (minutes):  35   Critical care was necessary to treat or prevent imminent or life-threatening deterioration of the following conditions:  Respiratory failure   Critical care was time spent personally by me on the following activities:  Development of treatment plan with patient or surrogate, discussions with consultants, evaluation of patient's response to treatment, examination of patient, ordering and review of laboratory studies, ordering and review of radiographic studies, ordering and performing treatments and interventions, pulse oximetry, re-evaluation of patient's condition and review of old charts   Care discussed with: admitting provider   Comments:     Need for BiPAP and continuous albuterol  and frequent reassessments    MEDICATIONS ORDERED IN ED: Medications  enoxaparin  (LOVENOX ) injection 40 mg (has no administration in time range)  sodium chloride  0.9 % bolus 1,000 mL (1,000 mLs Intravenous New Bag/Given 04/13/24 1507)  acetaminophen  (TYLENOL ) tablet 650 mg (has no administration in time range)    Or  acetaminophen  (TYLENOL ) suppository 650 mg (has no administration in time range)  polyethylene glycol (MIRALAX  / GLYCOLAX ) packet 17 g (has no administration in time range)  sorbitol 70 % solution 30 mL (has no administration in time range)  ondansetron  (ZOFRAN ) tablet 4 mg (has no administration in time range)    Or  ondansetron  (ZOFRAN )  injection 4 mg (has no administration in time range)  cefTRIAXone  (ROCEPHIN ) 2 g in sodium chloride  0.9 % 100 mL IVPB (has no administration in time range)  azithromycin  (ZITHROMAX ) 500 mg in sodium chloride  0.9 % 250 mL IVPB (has no administration in time range)  methylPREDNISolone  sodium succinate  (SOLU-MEDROL ) 125 mg/2 mL injection 125 mg (125 mg Intravenous Given 04/13/24 1302)  albuterol  (PROVENTIL ) (2.5 MG/3ML) 0.083% nebulizer solution (5 mg/hr Nebulization Given 04/13/24 1251)  levofloxacin  (LEVAQUIN ) IVPB 750 mg (0 mg Intravenous Stopped  04/13/24 1505)     IMPRESSION / MDM / ASSESSMENT AND PLAN / ED COURSE                                Differential diagnosis includes, but is not limited to, COPD exacerbation, sepsis, pneumonia, CHF exacerbation, electrolyte derangement  ED course: Patient presents acutely and we were unable to obtain a good Plath on a nonrebreather roll and she is in distress.  Decision made to convert the patient to BiPAP.  She was given 125 mg of Solu-Medrol  and continuous albuterol .  Chest x-ray consistent with pneumonia and patient was sepsis bundled.  I have held off on a large amount of IV fluid given concern for fluid overload and tenuous respiratory status.  Case discussed with hospitalist for admission.  On reassessment patient appears much improved and more comfortable on the BiPAP   Clinical Course as of 04/13/24 1619  Tue Apr 13, 2024  1307 WBC(!): 28.4 Extremely elevated [HD]  1417 Case discussed with hospitalist for admission [HD]    Clinical Course User Index [HD] Nicholaus Rolland BRAVO, MD   -- Risk: 5 This patient has a high risk of morbidity due to further diagnostic testing or treatment. Rationale: This patient's evaluation and management involve a high risk of morbidity due to the potential severity of presenting symptoms, need for diagnostic testing, and/or initiation of treatment that may require close monitoring. The differential includes  conditions with potential for significant deterioration or requiring escalation of care. Treatment decisions in the ED, including medication administration, procedural interventions, or disposition planning, reflect this level of risk. COPA: 5 The patient has the following acute or chronic illness/injury that poses a possible threat to life or bodily function: [X] : The patient has a potentially serious acute condition or an acute exacerbation of a chronic illness requiring urgent evaluation and management in the Emergency Department. The clinical presentation necessitates immediate consideration of life-threatening or function-threatening diagnoses, even if they are ultimately ruled out.   FINAL CLINICAL IMPRESSION(S) / ED DIAGNOSES   Final diagnoses:  Sepsis, due to unspecified organism, unspecified whether acute organ dysfunction present (HCC)  Pneumonia due to infectious organism, unspecified laterality, unspecified part of lung  Bronchospasm     Rx / DC Orders   ED Discharge Orders     None        Note:  This document was prepared using Dragon voice recognition software and may include unintentional dictation errors.   Nicholaus Rolland BRAVO, MD 04/13/24 [phone]

## 2024-04-13 NOTE — ED Triage Notes (Signed)
 Pt from home via [facility] , difficulty breathing with exasperation for 3 days, pt says ems give breathing treatment yesterday and refused transfer to hospital.  Took at home albuterol  inhaler with no relieve today COPD history Bilateral wheezing EMS Meds: Ipratropium 1ml and 5ml albuterol   EMS VS: O2 room air- low 80, 99% on nebulizer, BP 130/75, HR-130 ST 4-5L of oxygen  at home

## 2024-04-13 NOTE — ED Notes (Signed)
 Pharmacy given note of PCP, [facility]over out of [place], to help verify home meds.

## 2024-04-13 NOTE — Progress Notes (Signed)
 Elink following for sepsis protocol.

## 2024-04-13 NOTE — H&P (Addendum)
 History and Physical    Patient: Regina Bernard FMW:[id number] DOB: 01-12-1956 DOA: 04/13/2024 DOS: the patient was seen and examined on 04/13/2024 PCP: Glover Lenis, MD  Patient coming from: Home  Chief Complaint:  Chief Complaint  Patient presents with   Shortness of Breath   HPI: Regina Bernard is a 68 y.o. female with medical history significant of advanced COPD on chronic prednisone  and 4-5 L/min O2 at baseline, history of breast cancer, hypertension who presented to the ED today by EMS for evaluation of progressively worsening shortness of breath.  Patient reports about 3 days difficulty breathing getting worse.  She denies fevers but reports chills, also reports productive cough, diminished appetite with poor p.o. intake, feeling dehydrated.  She notes her baseline dyspnea seem to get worse several weeks ago.  She denies abdominal pain nausea vomiting diarrhea, headache dizziness, unilateral weakness numbness or tingling, hematuria dysuria or other symptoms.  No known sick contacts.  EMS was reportedly called yesterday but patient refused transfer to the hospital at that time.  On arrival to the ED today, patient was on nonrebreather mask with reported O2 sat of only 72% and in respiratory distress.  Patient was emergently placed on BiPAP with improved O2 sats and work of breathing.  ED course: Temp 98.4 F, initial HR 140, RR 30, BP 133/84, SpO2 100% on BiPAP. Labs obtained including CMP and CBC were notable for sodium 129, chloride 87, nonfasting glucose 100, alk phos 137, albumin 3.4, WBC significantly elevated 28.4, hemoglobin 11.5.  BNP was 127.3.  Troponin normal 14.  Venous blood gas pH 7.42, pCO2 normal 59.    Chest x-ray showed multifocal pulmonary opacities in the right mid and lower lung zone suspicious for pneumonia with background emphysema and chronic scarring and bulla in the lateral left upper lobe.  Patient is being admitted to progressive care unit for further  evaluation and management of acute on chronic respiratory failure with hypoxia in the setting of sepsis due to community-acquired pneumonia.   Review of Systems: As mentioned in the history of present illness. All other systems reviewed and are negative.   Past Medical History:  Diagnosis Date   Abnormal CT scan, chest    Anxiety    Asthma    Breast cancer (HCC)    Breast CA- Right    Breast cancer (HCC) 06/18/2011   right breast cancer   COPD (chronic obstructive pulmonary disease) (HCC)    Endometriosis    tx with vaginal hysterectomy   Fatty liver    Hepatitis C 1998   [facility] trial treatment in-patient study 2005. HCV 740, 05/20/11.   History of substance abuse (HCC)    Heroine, cocaine, marijuana. States all of them. Heavy use 1995 to 2000. Occasional use before and after. None since 2006.    Hot flashes related to aromatase inhibitor therapy    Hypertension    Lung mass    Peripheral neuropathy    in hands and feet   Past Surgical History:  Procedure Laterality Date   BREAST LUMPECTOMY Right 2013   COLONOSCOPY WITH PROPOFOL  N/A 03/20/2022   Procedure: COLONOSCOPY WITH PROPOFOL ;  Surgeon: Unk Regina Skiff, MD;  Location: [facility] ENDOSCOPY;  Service: Gastroenterology;  Laterality: N/A;   ESOPHAGOGASTRODUODENOSCOPY (EGD) WITH PROPOFOL  N/A 10/29/2021   Procedure: ESOPHAGOGASTRODUODENOSCOPY (EGD) WITH PROPOFOL ;  Surgeon: Unk Regina Skiff, MD;  Location: [facility] ENDOSCOPY;  Service: Gastroenterology;  Laterality: N/A;   ESOPHAGOGASTRODUODENOSCOPY (EGD) WITH PROPOFOL  N/A 02/14/2022   Procedure: ESOPHAGOGASTRODUODENOSCOPY (EGD) WITH  PROPOFOL ;  Surgeon: Unk Regina Skiff, MD;  Location: [facility] ENDOSCOPY;  Service: Gastroenterology;  Laterality: N/A;  DRIVER 5 - 10 MINUTES AWAY   INTRAMEDULLARY (IM) NAIL INTERTROCHANTERIC Left 04/26/2023   Procedure: INTRAMEDULLARY (IM) NAIL INTERTROCHANTERIC;  Surgeon: Regina Barrio, MD;  Location: [facility] ORS;  Service: Orthopedics;  Laterality: Left;    TUBAL LIGATION  1996   Upper right leg benign tumor removed     VAGINAL HYSTERECTOMY  2001   Social History:  reports that she has been smoking e-cigarettes. She has never used smokeless tobacco. She reports current alcohol use. She reports that she does not use drugs.  Allergies  Allergen Reactions   Penicillins Swelling and Rash    Has patient had a PCN reaction causing immediate rash, facial/tongue/throat swelling, SOB or lightheadedness with hypotension: Yes Has patient had a PCN reaction causing severe rash involving mucus membranes or skin necrosis: No Has patient had a PCN reaction that required hospitalization: No Has patient had a PCN reaction occurring within the last 10 years: No If all of the above answers are NO, then may proceed with Cephalosporin use.     Family History  Problem Relation Age of Onset   Diabetes Maternal Grandmother    Lung cancer Father    Lung cancer Mother    Hypertension Mother    Heart disease Mother    Skin cancer Mother    Breast cancer Neg Hx     Prior to Admission medications   Medication Sig Start Date End Date Taking? Authorizing Provider  acetaminophen  (TYLENOL ) 325 MG tablet Take 2 tablets (650 mg total) by mouth every 6 (six) hours as needed for mild pain (pain score 1-3), fever or moderate pain (pain score 4-6). 05/02/23   Josette Ade, MD  albuterol  (PROVENTIL ) (2.5 MG/3ML) 0.083% nebulizer solution Take 2.5 mg by nebulization every 6 (six) hours as needed for wheezing.  09/16/14   [provider]  albuterol  (VENTOLIN  HFA) 108 (90 Base) MCG/ACT inhaler Inhale 2 puffs into the lungs every 6 (six) hours as needed for wheezing or shortness of breath. 07/06/22   Gherghe, Costin M, MD  cyanocobalamin  1000 MCG tablet Take 1 tablet (1,000 mcg total) by mouth daily. 05/03/23   Josette Ade, MD  DULoxetine  (CYMBALTA ) 30 MG capsule Take 1 capsule (30 mg total) by mouth 2 (two) times daily. 09/19/17   Brahmanday, Govinda R, MD   enoxaparin  (LOVENOX ) 30 MG/0.3ML injection Inject 0.3 mLs (30 mg total) into the skin daily for 14 days. 05/03/23 05/17/23  Josette Ade, MD  feeding supplement (ENSURE ENLIVE / ENSURE PLUS) LIQD Take 237 mLs by mouth 2 (two) times daily between meals. 05/02/23   Josette Ade, MD  ferrous sulfate  325 (65 FE) MG tablet Take 1 tablet (325 mg total) by mouth daily with breakfast. 05/03/23   Josette Ade, MD  gabapentin  (NEURONTIN ) 300 MG capsule Take 1 capsule (300 mg total) by mouth 3 (three) times daily. 05/02/23   Josette Ade, MD  lamoTRIgine  (LAMICTAL ) 100 MG tablet Take 1 tablet (100 mg total) by mouth daily. 05/02/23   Josette Ade, MD  loperamide  (IMODIUM ) 2 MG capsule Take 1 capsule (2 mg total) by mouth as needed for diarrhea or loose stools. 07/06/22   Gherghe, Costin M, MD  metoprolol  succinate (TOPROL -XL) 100 MG 24 hr tablet Take 100 mg by mouth daily. Take with or immediately following a meal.    [provider]  Multiple Vitamin (MULTI-VITAMINS) TABS Take 1 tablet by mouth daily.  [provider]  nicotine  (NICODERM CQ  - DOSED IN MG/24 HOURS) 21 mg/24hr patch Place 1 patch (21 mg total) onto the skin daily. 05/03/23   Josette Ade, MD  omeprazole  (PRILOSEC ) 20 MG capsule Take 40 mg by mouth every morning. 02/14/22   [provider]  predniSONE  (DELTASONE ) 10 MG tablet 4 tabs po day 1; 3 tabs po day 2,3; 2 tabs po day 4,5; 1 tab po day 6,7; 1/2 tab po day 8,9. 05/02/23   Josette Ade, MD  QUEtiapine  (SEROQUEL ) 25 MG tablet Take 25 mg by mouth at bedtime as needed (sleep). 08/24/21   [provider]  senna (SENOKOT) 8.6 MG TABS tablet Take 1 tablet (8.6 mg total) by mouth 2 (two) times daily. 05/02/23   Wieting, Richard, MD  TRELEGY ELLIPTA 100-62.5-25 MCG/INH AEPB Inhale 1 puff into the lungs daily. 11/06/18   [provider]    Physical Exam: Vitals:   04/13/24 1300 04/13/24 1309 04/13/24 1330 04/13/24 1400  BP:  111/69  (!) 108/92 107/76  Pulse: (!) 129  (!) 131 (!) 124  Resp: (!) 21  (!) 34 20  Temp:      TempSrc:      SpO2: 100% 100% 100% 100%  Weight:      Height:       General exam: awake, alert, no acute distress frail and chronically ill-appearing HEENT: atraumatic, clear conjunctiva, anicteric sclera, moist mucus membranes, hearing grossly normal  Respiratory system: Diminished aeration and diffuse expiratory wheezes, increased work of breathing with accessory muscle use at rest, on BiPAP Cardiovascular system: normal S1/S2, tachycardic, regular rhythm, no JVD, murmurs, rubs, gallops, no pedal edema.   Gastrointestinal system: soft, NT, ND, no HSM felt, +bowel sounds. Central nervous system: A&O x3. no gross focal neurologic deficits, normal speech Extremities: moves all, no edema, scattered patchy ecchymosis on extremities Skin: dry, intact, normal temperature, No rashes, lesions or ulcers Psychiatry: Anxious mood, congruent affect, judgement and insight appear normal  Data Reviewed:  As reviewed in detail above  Assessment and Plan:  Severe sepsis due to multifocal pneumonia Severe sepsis POA as evidenced by tachycardia, tachypnea, leukocytosis, organ dysfunction evidenced by acute on chronic respiratory failure. Treated with IV Levaquin  in the ED. Acute on chronic respiratory failure with hypoxia Advanced COPD -on chronic prednisone , on 4-5 L/min baseline oxygen .  Presented in respiratory distress with O2 sat 72% on nonrebreather mask reported by EDP -- Will give slightly reduced sepsis IV fluid given history of CHF -1 L bolus -- Patient has penicillin allergy but has tolerated cephalosporins -- IV antibiotics with Rocephin  and Zithromax  -- IV Solu-Medrol  60 mg twice daily -- Scheduled Mucinex  -- Follow blood cultures -- Collect sputum culture  -- Check antigens for strep pneumo and Legionella -- Continuous pulse ox -- BiPAP as needed -- Supplemental O2, maintain sats 88 to  94% -- Scheduled DuoNebs, as needed Xopenex -- Substitute Breztri for home Trelegy -- Patient follows with Dr. Theotis pulmonology -consulted & currently awaiting reply -- N.p.o. while on BiPAP, may take short breaks for ice chips  Chronic HFpEF -appears euvolemic, compensated.  Echo in September EF 55%, grade 1 diastolic dysfunction Essential hypertension -- Continue home Lasix   -- Continue home metoprolol  -- Daily weights to monitor volume status  Hypothyroidism -continue home Synthroid  Neuropathy -continue home gabapentin   Depression/anxiety -continue home Cymbalta  Insomnia -continue home Seroquel      Advance Care Planning: CODE STATUS DO NOT INTUBATE  Discussed CODE STATUS in detail with patient  and husband at bedside.  Patient states she would not want to be put on the ventilator.  She does say do would you can in the event of cardiac arrest, agreeing to compressions and defibrillation but if not successful would want to be let go.  She wants to discuss this further with her husband when she is off BiPAP.   Consults: Pulmonology Dr. Theotis  Family Communication: Husband at bedside  Severity of Illness: The appropriate patient status for this patient is INPATIENT. Inpatient status is judged to be reasonable and necessary in order to provide the required intensity of service to ensure the patient's safety. The patient's presenting symptoms, physical exam findings, and initial radiographic and laboratory data in the context of their chronic comorbidities is felt to place them at high risk for further clinical deterioration. Furthermore, it is not anticipated that the patient will be medically stable for discharge from the hospital within 2 midnights of admission.   * I certify that at the point of admission it is my clinical judgment that the patient will require inpatient hospital care spanning beyond 2 midnights from the point of admission due to high intensity of service,  high risk for further deterioration and high frequency of surveillance required.*  Author: Burnard DELENA Cunning, DO 04/13/2024 2:34 PM  For on call review [email or web address].

## 2024-04-13 NOTE — Progress Notes (Signed)
 CODE SEPSIS - PHARMACY COMMUNICATION  **Broad Spectrum Antibiotics should be administered within 1 hour of Sepsis diagnosis**  Time Code Sepsis Called/Page Received: 1312  Antibiotics Ordered: levofloxacin   Time of 1st antibiotic administration: 1329  Additional action taken by pharmacy: -  If necessary, Name of Provider/Nurse Contacted: -    Lum VEAR Mania ,PharmD Clinical Pharmacist  04/13/2024  1:12 PM

## 2024-04-14 DIAGNOSIS — A419 Sepsis, unspecified organism: Secondary | ICD-10-CM | POA: Diagnosis not present

## 2024-04-14 DIAGNOSIS — R6520 Severe sepsis without septic shock: Secondary | ICD-10-CM

## 2024-04-14 LAB — IRON AND TIBC
Iron: 21 ug/dL — ABNORMAL LOW (ref 28–170)
Saturation Ratios: 9 % — ABNORMAL LOW (ref 10.4–31.8)
TIBC: 245 ug/dL — ABNORMAL LOW (ref 250–450)
UIBC: 224 ug/dL

## 2024-04-14 LAB — CBC
HCT: 30 % — ABNORMAL LOW (ref 36.0–46.0)
Hemoglobin: 10 g/dL — ABNORMAL LOW (ref 12.0–15.0)
MCH: 33.8 pg (ref 26.0–34.0)
MCHC: 33.3 g/dL (ref 30.0–36.0)
MCV: 101.4 fL — ABNORMAL HIGH (ref 80.0–100.0)
Platelets: 318 K/uL (ref 150–400)
RBC: 2.96 MIL/uL — ABNORMAL LOW (ref 3.87–5.11)
RDW: 13.2 % (ref 11.5–15.5)
WBC: 18.4 K/uL — ABNORMAL HIGH (ref 4.0–10.5)
nRBC: 0 % (ref 0.0–0.2)

## 2024-04-14 LAB — BLOOD CULTURE ID PANEL (REFLEXED) - BCID2

## 2024-04-14 LAB — RESPIRATORY PANEL BY PCR

## 2024-04-14 LAB — BASIC METABOLIC PANEL WITH GFR
Anion gap: 11 (ref 5–15)
BUN: 21 mg/dL (ref 8–23)
CO2: 27 mmol/L (ref 22–32)
Calcium: 9.2 mg/dL (ref 8.9–10.3)
Chloride: 91 mmol/L — ABNORMAL LOW (ref 98–111)
Creatinine, Ser: 1.16 mg/dL — ABNORMAL HIGH (ref 0.44–1.00)
GFR, Estimated: 52 mL/min — ABNORMAL LOW (ref >60)
Glucose, Bld: 187 mg/dL — ABNORMAL HIGH (ref 70–99)
Potassium: 4.3 mmol/L (ref 3.5–5.1)
Sodium: 129 mmol/L — ABNORMAL LOW (ref 135–145)

## 2024-04-14 LAB — MRSA NEXT GEN BY PCR, NASAL: MRSA by PCR Next Gen: NOT DETECTED

## 2024-04-14 LAB — HIV ANTIBODY (ROUTINE TESTING W REFLEX): HIV Screen 4th Generation wRfx: NONREACTIVE

## 2024-04-14 LAB — PROTIME-INR
INR: 1 (ref 0.8–1.2)
Prothrombin Time: 14.3 s (ref 11.4–15.2)

## 2024-04-14 LAB — MAGNESIUM: Magnesium: 1.7 mg/dL (ref 1.7–2.4)

## 2024-04-14 LAB — STREP PNEUMONIAE URINARY ANTIGEN: Strep Pneumo Urinary Antigen: NEGATIVE

## 2024-04-14 LAB — OSMOLALITY: Osmolality: 281 mosm/kg (ref 275–295)

## 2024-04-14 LAB — VITAMIN B12: Vitamin B-12: 431 pg/mL (ref 180–914)

## 2024-04-14 LAB — PROCALCITONIN: Procalcitonin: 0.68 ng/mL

## 2024-04-14 LAB — TROPONIN I (HIGH SENSITIVITY): Troponin I (High Sensitivity): 10 ng/L (ref <18)

## 2024-04-14 LAB — FOLATE: Folate: 7.6 ng/mL (ref >5.9)

## 2024-04-14 LAB — CORTISOL-AM, BLOOD: Cortisol - AM: 10.2 ug/dL (ref 6.7–22.6)

## 2024-04-14 LAB — PHOSPHORUS: Phosphorus: 2.4 mg/dL — ABNORMAL LOW (ref 2.5–4.6)

## 2024-04-14 MED ORDER — VITAMIN C 500 MG PO TABS
500.0000 mg | ORAL_TABLET | Freq: Every day | ORAL | Status: DC
  Administered 2024-04-14 – 2024-04-18 (×5): 500 mg via ORAL
  Filled 2024-04-14 (×5): qty 1

## 2024-04-14 MED ORDER — POLYSACCHARIDE IRON COMPLEX 150 MG PO CAPS
150.0000 mg | ORAL_CAPSULE | Freq: Every day | ORAL | Status: DC
  Administered 2024-04-14 – 2024-04-18 (×5): 150 mg via ORAL
  Filled 2024-04-14 (×5): qty 1

## 2024-04-14 MED ORDER — FOLIC ACID 1 MG PO TABS
1.0000 mg | ORAL_TABLET | Freq: Every day | ORAL | Status: DC
  Administered 2024-04-14 – 2024-04-18 (×5): 1 mg via ORAL
  Filled 2024-04-14 (×5): qty 1

## 2024-04-14 MED ORDER — LACTATED RINGERS IV SOLN: INTRAVENOUS | Status: AC

## 2024-04-14 NOTE — Consult Note (Signed)
 PULMONOLOGY         Date: 04/14/2024,   MRN# [id number] Regina Bernard 10-May-1956     AdmissionWeight: 49.4 kg                 CurrentWeight: 49.4 kg  Referring provider: Dr Von   CHIEF COMPLAINT:   Acute exacerbation of COPD   HISTORY OF PRESENT ILLNESS   Is a 68 year old female with a history of asthma and COPD overlap syndrome, history of right sided breast cancer, endometriosis with hysterectomy, chronic hep C with Polly substance abuse history, essential hypertension, peripheral neuropathy, chronic use of aromatase inhibitor therapy and chronic hypoxemia utilizing 4 to 5 L/min of supplemental oxygen  at baseline.  She reports having few days of worsening respiratory symptoms with cough and in the field on arrival was noted to be hypoxemic at 72% with respiratory distress and accessory muscle use.  She was placed on BiPAP support with subsequent improvement however continues to show signs of distress..  Chest x-ray performed shows acute on chronic changes with history of breast cancer and cavitary lung disease as well as advanced COPD.  PCCM consultation placed for additional evaluation and management of patient with complex chronic lung disease with acute on chronic hypoxemic respiratory failure and respiratory distress requiring NIV support.   PAST MEDICAL HISTORY   Past Medical History:  Diagnosis Date   Abnormal CT scan, chest    Anxiety    Asthma    Breast cancer (HCC)    Breast CA- Right    Breast cancer (HCC) 06/18/2011   right breast cancer   COPD (chronic obstructive pulmonary disease) (HCC)    Endometriosis    tx with vaginal hysterectomy   Fatty liver    Hepatitis C 1998   [facility] trial treatment in-patient study 2005. HCV 740, 05/20/11.   History of substance abuse (HCC)    Heroine, cocaine, marijuana. States all of them. Heavy use 1995 to 2000. Occasional use before and after. None since 2006.    Hot flashes related to aromatase inhibitor therapy     Hypertension    Lung mass    Peripheral neuropathy    in hands and feet     SURGICAL HISTORY   Past Surgical History:  Procedure Laterality Date   BREAST LUMPECTOMY Right 2013   COLONOSCOPY WITH PROPOFOL  N/A 03/20/2022   Procedure: COLONOSCOPY WITH PROPOFOL ;  Surgeon: Unk Corinn Skiff, MD;  Location: [facility];  Service: Gastroenterology;  Laterality: N/A;   ESOPHAGOGASTRODUODENOSCOPY (EGD) WITH PROPOFOL  N/A 10/29/2021   Procedure: ESOPHAGOGASTRODUODENOSCOPY (EGD) WITH PROPOFOL ;  Surgeon: Unk Corinn Skiff, MD;  Location: [facility];  Service: Gastroenterology;  Laterality: N/A;   ESOPHAGOGASTRODUODENOSCOPY (EGD) WITH PROPOFOL  N/A 02/14/2022   Procedure: ESOPHAGOGASTRODUODENOSCOPY (EGD) WITH PROPOFOL ;  Surgeon: Unk Corinn Skiff, MD;  Location: [facility];  Service: Gastroenterology;  Laterality: N/A;  DRIVER 5 - 10 MINUTES AWAY   INTRAMEDULLARY (IM) NAIL INTERTROCHANTERIC Left 04/26/2023   Procedure: INTRAMEDULLARY (IM) NAIL INTERTROCHANTERIC;  Surgeon: Cleotilde Barrio, MD;  Location: [facility];  Service: Orthopedics;  Laterality: Left;   TUBAL LIGATION  1996   Upper right leg benign tumor removed     VAGINAL HYSTERECTOMY  2001     FAMILY HISTORY   Family History  Problem Relation Age of Onset   Diabetes Maternal Grandmother    Lung cancer Father    Lung cancer Mother    Hypertension Mother    Heart disease Mother    Skin cancer Mother  Breast cancer Neg Hx      SOCIAL HISTORY   Social History   Tobacco Use   Smoking status: Every Day    Types: E-cigarettes   Smokeless tobacco: Never   Tobacco comments:    last cigeratte use 6 days ago (11/30/15)  Vaping Use   Vaping status: Every Day  Substance Use Topics   Alcohol use: Yes    Comment: occ   Drug use: No     MEDICATIONS    Home Medication:  Current Outpatient Rx   Order #: [id number] Class: Historical Med   Order #: [id number] Class: Normal   Order #: [id number] Class: Normal   Order  #: [id number] Class: Historical Med   Order #: [id number] Class: Print   Order #: [id number] Class: Print   Order #: [id number] Class: Historical Med   Order #: [id number] Class: Historical Med   Order #: [id number] Class: Print   Order #: [id number] Class: Historical Med   Order #: [id number] Class: Historical Med   Order #: [id number] Class: OTC   Order #: [id number] Class: Print   Order #: [id number] Class: Print   Order #: [id number] Class: Print   Order #: [id number] Class: Normal   Order #: [id number] Class: Historical Med   Order #: [id number] Class: Print   Order #: [id number] Class: Historical Med   Order #: [id number] Class: Print    Current Medication:  Current Facility-Administered Medications:    acetaminophen  (TYLENOL ) tablet 650 mg, 650 mg, Oral, Q6H PRN **OR** acetaminophen  (TYLENOL ) suppository 650 mg, 650 mg, Rectal, Q6H PRN, Fausto Sor A, DO   ascorbic acid (VITAMIN C) tablet 500 mg, 500 mg, Oral, Daily, Von Bellis, MD   azithromycin  (ZITHROMAX ) 500 mg in sodium chloride  0.9 % 250 mL IVPB, 500 mg, Intravenous, Q24H, Perley Lum DEL, RPH, Stopped at 04/14/24 1613   budesonide -glycopyrrolate -formoterol (BREZTRI) 160-9-4.8 MCG/ACT inhaler 2 puff, 2 puff, Inhalation, BID, Fausto Sor A, DO, 2 puff at 04/14/24 0943   cefTRIAXone  (ROCEPHIN ) 2 g in sodium chloride  0.9 % 100 mL IVPB, 2 g, Intravenous, Q24H, Hunt, Madison H, RPH, Stopped at 04/14/24 1456   DULoxetine  (CYMBALTA ) DR capsule 30 mg, 30 mg, Oral, BID, Fausto Sor A, DO, 30 mg at 04/14/24 1037   enoxaparin  (LOVENOX ) injection 40 mg, 40 mg, Subcutaneous, Q24H, Fausto Sor A, DO, 40 mg at 04/13/24 2123   feeding supplement (ENSURE ENLIVE / ENSURE PLUS) liquid 237 mL, 237 mL, Oral, BID BM, Fausto Sor A, DO, 237 mL at 04/14/24 1430   folic acid (FOLVITE) tablet 1 mg, 1 mg, Oral, Daily, Von Bellis, MD   furosemide  (LASIX ) tablet 20 mg, 20 mg, Oral, Daily, Fausto Sor A, DO, 20 mg at 04/14/24 9060   gabapentin  (NEURONTIN )  capsule 300 mg, 300 mg, Oral, TID, Fausto Sor A, DO, 300 mg at 04/14/24 1612   guaiFENesin  (MUCINEX ) 12 hr tablet 600 mg, 600 mg, Oral, BID, Fausto Sor A, DO, 600 mg at 04/14/24 9060   ipratropium-albuterol  (DUONEB) 0.5-2.5 (3) MG/3ML nebulizer solution 3 mL, 3 mL, Nebulization, Q6H, Griffith, Kelly A, DO, 3 mL at 04/14/24 1430   iron  polysaccharides (NIFEREX) capsule 150 mg, 150 mg, Oral, Daily, Von Bellis, MD   lamoTRIgine  (LAMICTAL ) tablet 100 mg, 100 mg, Oral, Daily, Fausto Sor A, DO, 100 mg at 04/14/24 1037   levalbuterol (XOPENEX) nebulizer solution 0.63 mg, 0.63 mg, Nebulization, Q3H PRN, Fausto Sor A, DO   levothyroxine (SYNTHROID) tablet 50 mcg, 50 mcg, Oral, QAC breakfast, Fausto Sor A, DO, 50 mcg at 04/14/24 0644   methylPREDNISolone  sodium succinate  (SOLU-MEDROL ) 125 mg/2 mL injection 60  mg, 60 mg, Intravenous, Q12H, Fausto Burnard LABOR, DO, 60 mg at 04/14/24 9057   metoprolol  succinate (TOPROL -XL) 24 hr tablet 100 mg, 100 mg, Oral, Daily, Fausto Burnard A, DO, 100 mg at 04/14/24 9060   ondansetron  (ZOFRAN ) tablet 4 mg, 4 mg, Oral, Q6H PRN **OR** ondansetron  (ZOFRAN ) injection 4 mg, 4 mg, Intravenous, Q6H PRN, Fausto Burnard A, DO   polyethylene glycol (MIRALAX  / GLYCOLAX ) packet 17 g, 17 g, Oral, Daily PRN, Fausto Burnard A, DO   QUEtiapine  (SEROQUEL ) tablet 25 mg, 25 mg, Oral, QHS PRN, Fausto Burnard A, DO   sorbitol 70 % solution 30 mL, 30 mL, Oral, Daily PRN, Fausto Burnard A, DO  Current Outpatient Medications:    albuterol  (PROVENTIL ) (2.5 MG/3ML) 0.083% nebulizer solution, Take 2.5 mg by nebulization every 6 (six) hours as needed for wheezing. , Disp: , Rfl:    albuterol  (VENTOLIN  HFA) 108 (90 Base) MCG/ACT inhaler, Inhale 2 puffs into the lungs every 6 (six) hours as needed for wheezing or shortness of breath., Disp: 8 g, Rfl: 2   DULoxetine  (CYMBALTA ) 30 MG capsule, Take 1 capsule (30 mg total) by mouth 2 (two) times daily., Disp: 180 capsule, Rfl:  3   furosemide  (LASIX ) 20 MG tablet, Take 20 mg by mouth daily., Disp: , Rfl:    gabapentin  (NEURONTIN ) 300 MG capsule, Take 1 capsule (300 mg total) by mouth 3 (three) times daily., Disp: 90 capsule, Rfl: 0   lamoTRIgine  (LAMICTAL ) 100 MG tablet, Take 1 tablet (100 mg total) by mouth daily., Disp: 30 tablet, Rfl: 0   levothyroxine (SYNTHROID) 50 MCG tablet, Take 50 mcg by mouth daily before breakfast. Take on an empty stomach with water 30-90 mins before breakfast, Disp: , Rfl:    metoprolol  succinate (TOPROL -XL) 100 MG 24 hr tablet, Take 100 mg by mouth daily. Take with or immediately following a meal., Disp: , Rfl:    predniSONE  (DELTASONE ) 10 MG tablet, 4 tabs po day 1; 3 tabs po day 2,3; 2 tabs po day 4,5; 1 tab po day 6,7; 1/2 tab po day 8,9., Disp: 17 tablet, Rfl: 0   QUEtiapine  (SEROQUEL ) 25 MG tablet, Take 25 mg by mouth at bedtime as needed (sleep)., Disp: , Rfl:    TRELEGY ELLIPTA 100-62.5-25 MCG/INH AEPB, Inhale 1 puff into the lungs daily., Disp: , Rfl:    acetaminophen  (TYLENOL ) 325 MG tablet, Take 2 tablets (650 mg total) by mouth every 6 (six) hours as needed for mild pain (pain score 1-3), fever or moderate pain (pain score 4-6). (Patient not taking: Reported on 04/13/2024), Disp: , Rfl:    cyanocobalamin  1000 MCG tablet, Take 1 tablet (1,000 mcg total) by mouth daily. (Patient not taking: Reported on 04/13/2024), Disp: 30 tablet, Rfl: 0   feeding supplement (ENSURE ENLIVE / ENSURE PLUS) LIQD, Take 237 mLs by mouth 2 (two) times daily between meals., Disp: 14220 mL, Rfl: 0   ferrous sulfate  325 (65 FE) MG tablet, Take 1 tablet (325 mg total) by mouth daily with breakfast. (Patient not taking: Reported on 04/13/2024), Disp: 30 tablet, Rfl: 0   loperamide  (IMODIUM ) 2 MG capsule, Take 1 capsule (2 mg total) by mouth as needed for diarrhea or loose stools. (Patient not taking: Reported on 04/13/2024), Disp: 20 capsule, Rfl: 0   Multiple Vitamin (MULTI-VITAMINS) TABS, Take 1 tablet by mouth  daily.  (Patient not taking: Reported on 04/13/2024), Disp: , Rfl:    nicotine  (NICODERM CQ  - DOSED IN MG/24 HOURS) 21 mg/24hr patch, Place 1  patch (21 mg total) onto the skin daily. (Patient not taking: Reported on 04/13/2024), Disp: 28 patch, Rfl: 0   omeprazole  (PRILOSEC ) 20 MG capsule, Take 40 mg by mouth every morning. (Patient not taking: Reported on 04/13/2024), Disp: , Rfl:    senna (SENOKOT) 8.6 MG TABS tablet, Take 1 tablet (8.6 mg total) by mouth 2 (two) times daily. (Patient not taking: Reported on 04/13/2024), Disp: 60 tablet, Rfl: 0    ALLERGIES   Penicillins     REVIEW OF SYSTEMS    Review of Systems:  Gen:  Denies  fever, sweats, chills weigh loss  HEENT: Denies blurred vision, double vision, ear pain, eye pain, hearing loss, nose bleeds, sore throat Cardiac:  No dizziness, chest pain or heaviness, chest tightness,edema Resp:   reports dyspnea chronically  Gi: Denies swallowing difficulty, stomach pain, nausea or vomiting, diarrhea, constipation, bowel incontinence Gu:  Denies bladder incontinence, burning urine Ext:   Denies Joint pain, stiffness or swelling Skin: Denies  skin rash, easy bruising or bleeding or hives Endoc:  Denies polyuria, polydipsia , polyphagia or weight change Psych:   Denies depression, insomnia or hallucinations   Other:  All other systems negative   VS: BP 129/80   Pulse 99   Temp 97.9 F (36.6 C) (Oral)   Resp (!) 22   Ht 5' 3 (1.6 m)   Wt 49.4 kg   SpO2 99%   BMI 19.31 kg/m      PHYSICAL EXAM    GENERAL:NAD, no fevers, chills, no weakness no fatigue HEAD: Normocephalic, atraumatic.  EYES: Pupils equal, round, reactive to light. Extraocular muscles intact. No scleral icterus.  MOUTH: Moist mucosal membrane. Dentition intact. No abscess noted.  EAR, NOSE, THROAT: Clear without exudates. No external lesions.  NECK: Supple. No thyromegaly. No nodules. No JVD.  PULMONARY: decreased breath sounds with mild rhonchi worse at  bases bilaterally.  CARDIOVASCULAR: S1 and S2. Regular rate and rhythm. No murmurs, rubs, or gallops. No edema. Pedal pulses 2+ bilaterally.  GASTROINTESTINAL: Soft, nontender, nondistended. No masses. Positive bowel sounds. No hepatosplenomegaly.  MUSCULOSKELETAL: No swelling, clubbing, or edema. Range of motion full in all extremities.  NEUROLOGIC: Cranial nerves II through XII are intact. No gross focal neurological deficits. Sensation intact. Reflexes intact.  SKIN: No ulceration, lesions, rashes, or cyanosis. Skin warm and dry. Turgor intact.  PSYCHIATRIC: Mood, affect within normal limits. The patient is awake, alert and oriented x 3. Insight, judgment intact.       IMAGING   CLINICAL DATA:  Severe shortness of breath.   EXAM: PORTABLE CHEST 1 VIEW   COMPARISON:  Radiograph and CT 04/25/2023   FINDINGS: Multifocal pulmonary opacities in the right mid lower lung zone suspicious for pneumonia. Background emphysema with chronic scarring and bulla in the lateral left upper lobe. Mediastinal contours are distorted due due to scoliosis. The heart is normal in size with aortic atherosclerosis and tortuosity. No significant pleural effusion. Thoracic scoliosis.   IMPRESSION: 1. Multifocal pulmonary opacities in the right mid lower lung zone suspicious for pneumonia. 2. Background emphysema with chronic scarring and bulla in the lateral left upper lobe.     Electronically Signed   By: Andrea Gasman M.D.   On: 04/13/2024 14:10  ASSESSMENT/PLAN     Community acquired pneumonia    - RVP    - procalcitonin trend    - CRP trend    - empiric therapy with zithromax  and rocephin  IV    - MRSA prcr     -  AFB resp cultures     - fungitell    - legionella     - strep pneumoniae ur ag    - histoplasma ur ag    - gram stain and culture sputum     -there is mild AKI precluding CT chest with IV contrast , will give LR and consider in am to perform contrasted CT chest      Acute exacerbation of COPD - moderate  -continue nebulizer therapy with xopenex -agree with steroids - currently at 60mg  BID solumedrol -Incentive spirometry  -PT/OT    AKI - KDIGO 2 -suspect this is due to ischemia with acute on chronic hypoxemia -will rehydrate with LR and recheck BMP in am -hopefully we can get CT chest with contrast after renal indices are within reference range.   Thank you for allowing me to participate in the care of this patient.   Patient/Family are satisfied with care plan and all questions have been answered.    Provider disclosure: Patient with at least one acute or chronic illness or injury that poses a threat to life or bodily function and is being managed actively during this encounter.  All of the below services have been performed independently by signing provider:  review of prior documentation from internal and or external health records.  Review of previous and current lab results.  Interview and comprehensive assessment during patient visit today. Review of current and previous chest radiographs/CT scans. Discussion of management and test interpretation with health care team and patient/family.   This document was prepared using Dragon voice recognition software and may include unintentional dictation errors.     Vue Pavon, M.D.  Division of Pulmonary & Critical Care Medicine

## 2024-04-14 NOTE — Progress Notes (Addendum)
 PHARMACY - PHYSICIAN COMMUNICATION CRITICAL VALUE ALERT - BLOOD CULTURE IDENTIFICATION (BCID)  Regina Bernard is an 68 y.o. female who presented to [facility] on 04/13/2024 with a chief complaint of shortness of breath  Assessment:  sepsis 2/2 pneumonia  1/4, aerobic, GPCs, staphylococcus spp  Name of physician (or Provider) Contacted: Dr. Elvan Sor  Current antibiotics:  Ceftriaxone  2g IV q24h Azithromycin  500mg  IV q24h   Patient is on recommended antibiotics - No changes needed  Results for orders placed or performed during the hospital encounter of 04/13/24  Blood Culture ID Panel (Reflexed) (Collected: 04/13/2024  1:19 PM)  Result Value Ref Range   Enterococcus faecalis NOT DETECTED NOT DETECTED   Enterococcus Faecium NOT DETECTED NOT DETECTED   Listeria monocytogenes NOT DETECTED NOT DETECTED   Staphylococcus species DETECTED (A) NOT DETECTED   Staphylococcus aureus (BCID) NOT DETECTED NOT DETECTED   Staphylococcus epidermidis NOT DETECTED NOT DETECTED   Staphylococcus lugdunensis NOT DETECTED NOT DETECTED   Streptococcus species NOT DETECTED NOT DETECTED   Streptococcus agalactiae NOT DETECTED NOT DETECTED   Streptococcus pneumoniae NOT DETECTED NOT DETECTED   Streptococcus pyogenes NOT DETECTED NOT DETECTED   A.calcoaceticus-baumannii NOT DETECTED NOT DETECTED   Bacteroides fragilis NOT DETECTED NOT DETECTED   Enterobacterales NOT DETECTED NOT DETECTED   Enterobacter cloacae complex NOT DETECTED NOT DETECTED   Escherichia coli NOT DETECTED NOT DETECTED   Klebsiella aerogenes NOT DETECTED NOT DETECTED   Klebsiella oxytoca NOT DETECTED NOT DETECTED   Klebsiella pneumoniae NOT DETECTED NOT DETECTED   Proteus species NOT DETECTED NOT DETECTED   Salmonella species NOT DETECTED NOT DETECTED   Serratia marcescens NOT DETECTED NOT DETECTED   Haemophilus influenzae NOT DETECTED NOT DETECTED   Neisseria meningitidis NOT DETECTED NOT DETECTED   Pseudomonas aeruginosa NOT  DETECTED NOT DETECTED   Stenotrophomonas maltophilia NOT DETECTED NOT DETECTED   Candida albicans NOT DETECTED NOT DETECTED   Candida auris NOT DETECTED NOT DETECTED   Candida glabrata NOT DETECTED NOT DETECTED   Candida krusei NOT DETECTED NOT DETECTED   Candida parapsilosis NOT DETECTED NOT DETECTED   Candida tropicalis NOT DETECTED NOT DETECTED   Cryptococcus neoformans/gattii NOT DETECTED NOT DETECTED   Annabella LOISE Banks, PharmD Clinical Pharmacist 04/14/2024 6:13 PM

## 2024-04-14 NOTE — Progress Notes (Signed)
 [facility] Progress Note  Patient: Regina Bernard    FMW:[id number]  DOA: 04/13/2024     Date of Service: the patient was seen and examined on 04/14/2024  Chief Complaint  Patient presents with   Shortness of Breath   Brief hospital course: Regina Bernard is a 68 y.o. female with medical history significant of advanced COPD on chronic prednisone  and 4-5 L/min O2 at baseline, history of breast cancer, hypertension who presented to the ED today by EMS for evaluation of progressively worsening shortness of breath.  Patient reports about 3 days difficulty breathing getting worse.  She denies fevers but reports chills, also reports productive cough, diminished appetite with poor p.o. intake, feeling dehydrated.  She notes her baseline dyspnea seem to get worse several weeks ago.  She denies abdominal pain nausea vomiting diarrhea, headache dizziness, unilateral weakness numbness or tingling, hematuria dysuria or other symptoms.  No known sick contacts.  EMS was reportedly called yesterday but patient refused transfer to the hospital at that time.  On arrival to the ED today, patient was on nonrebreather mask with reported O2 sat of only 72% and in respiratory distress.  Patient was emergently placed on BiPAP with improved O2 sats and work of breathing.   ED course: Temp 98.4 F, initial HR 140, RR 30, BP 133/84, SpO2 100% on BiPAP. Labs obtained including CMP and CBC were notable for sodium 129, chloride 87, nonfasting glucose 100, alk phos 137, albumin 3.4, WBC significantly elevated 28.4, hemoglobin 11.5.  BNP was 127.3.  Troponin normal 14.  Venous blood gas pH 7.42, pCO2 normal 59.     Chest x-ray showed multifocal pulmonary opacities in the right mid and lower lung zone suspicious for pneumonia with background emphysema and chronic scarring and bulla in the lateral left upper lobe.   Patient is being admitted to progressive care unit for further evaluation and management of acute on chronic  respiratory failure with hypoxia in the setting of sepsis due to community-acquired pneumonia.    Assessment and Plan:  # Severe sepsis due to multifocal pneumonia Severe sepsis POA as evidenced by tachycardia, tachypnea, leukocytosis, organ dysfunction evidenced by acute on chronic respiratory failure. S/p IV Levaquin  given in the ED.  # Acute on chronic respiratory failure with hypoxia Advanced COPD -on chronic prednisone , on 4-5 L/min baseline oxygen .  Presented in respiratory distress with O2 sat 72% on nonrebreather mask reported by EDP -- s/p IVF due to sepsis, given history of CHF -1 L bolus -- Patient has penicillin allergy but has tolerated cephalosporins -- IV antibiotics with Rocephin  and Zithromax  -- IV Solu-Medrol  60 mg twice daily -- Scheduled Mucinex  -- Follow blood cultures -- Collect sputum culture  -- Check antigens for strep pneumo and Legionella -- Continuous pulse ox -- BiPAP as needed -- Supplemental O2, maintain sats 88 to 94% -- Scheduled DuoNebs, as needed Xopenex -- Substitute Breztri for home Trelegy -- Patient follows with Dr. Theotis pulmonology -consulted & currently awaiting reply -- N.p.o. while on BiPAP, may take short breaks for ice chips Follow RVP panel  Chronic HFpEF -appears euvolemic, compensated.  Echo in September EF 55%, grade 1 diastolic dysfunction Essential hypertension -- Continue home Lasix   -- Continue home metoprolol  -- Daily weights to monitor volume status   Hypothyroidism -continue home Synthroid   Neuropathy -continue home gabapentin    Depression/anxiety -continue home Cymbalta  Insomnia -continue home Seroquel    Anemia secondary to iron  deficiency. Tsat 9%, started oral iron  supplement with vitamin C.  Follow with PCP to repeat iron  profile after 3 to 6 months. Folic acid level 7.6, at lower end.  Started folic acid 1 mg p.o. daily. B12 level 431, within normal range   Body mass index is 19.31 kg/m.   Interventions:   Diet: Regular diet DVT Prophylaxis: Subcutaneous Lovenox    Advance goals of care discussion: Full code  Family Communication: family was not present at bedside, at the time of interview.  The pt provided permission to discuss medical plan with the family. Opportunity was given to ask question and all questions were answered satisfactorily.   Disposition:  Pt is from Home, admitted with Resp failure, PNA sepsis, still has resp failure on IV Abx, which precludes a safe discharge. Discharge to Home, when stable, may need few days to improve.  Subjective: No significant events overnight, patient is feeling improvement in the respiratory failure, shortness of breath, still has significant cough with wheezing.   Physical Exam: General: Mild respiratory distress, short of breath  Appear in mild distress, affect appropriate Eyes: PERRLA ENT: Oral Mucosa Clear, moist  Neck: no JVD,  Cardiovascular: S1 and S2 Present, no Murmur,  Respiratory: Increased respiratory effort, significant crackles and wheezing bilaterally.   Abdomen: Bowel Sound present, Soft and no tenderness,  Skin: no rashes Extremities: no Pedal edema, no calf tenderness Neurologic: without any new focal findings Gait not checked due to patient safety concerns  Vitals:   04/14/24 1233 04/14/24 1300 04/14/24 1455 04/14/24 1500  BP:  (!) 146/86 99/71 (!) 113/91  Pulse:  92 100 62  Resp:  18 (!) 22 (!) 23  Temp: 97.9 F (36.6 C)     TempSrc: Oral     SpO2:  100% 100% 97%  Weight:      Height:        Intake/Output Summary (Last 24 hours) at 04/14/2024 1626 Last data filed at 04/13/2024 1742 Gross per 24 hour  Intake 1000 ml  Output --  Net 1000 ml   Filed Weights   04/13/24 1238  Weight: 49.4 kg    Data Reviewed: I have personally reviewed and interpreted daily labs, tele strips, imagings as discussed above. I reviewed all nursing notes, pharmacy notes, vitals, pertinent old records I  have discussed plan of care as described above with RN and patient/family.  CBC: Recent Labs  Lab 04/13/24 1248 04/14/24 0224  WBC 28.4* 18.4*  NEUTROABS 25.9*  --   HGB 11.5* 10.0*  HCT 33.9* 30.0*  MCV 100.3* 101.4*  PLT 368 318   Basic Metabolic Panel: Recent Labs  Lab 04/13/24 1248 04/14/24 0224  NA 129* 129*  K 4.4 4.3  CL 87* 91*  CO2 30 27  GLUCOSE 100* 187*  BUN 21 21  CREATININE 0.82 1.16*  CALCIUM 9.3 9.2  MG 1.8 1.7  PHOS  --  2.4*    Studies: No results found.  Scheduled Meds:  budesonide -glycopyrrolate -formoterol  2 puff Inhalation BID   DULoxetine   30 mg Oral BID   enoxaparin  (LOVENOX ) injection  40 mg Subcutaneous Q24H   feeding supplement  237 mL Oral BID BM   furosemide   20 mg Oral Daily   gabapentin   300 mg Oral TID   guaiFENesin   600 mg Oral BID   ipratropium-albuterol   3 mL Nebulization Q6H   lamoTRIgine   100 mg Oral Daily   levothyroxine  50 mcg Oral QAC breakfast   methylPREDNISolone  (SOLU-MEDROL ) injection  60 mg Intravenous Q12H   metoprolol  succinate  100 mg Oral  Daily   Continuous Infusions:  azithromycin  Stopped (04/14/24 1613)   cefTRIAXone  (ROCEPHIN )  IV Stopped (04/14/24 1456)   PRN Meds: acetaminophen  **OR** acetaminophen , levalbuterol, ondansetron  **OR** ondansetron  (ZOFRAN ) IV, polyethylene glycol, QUEtiapine , sorbitol  Time spent: 35 minutes  Author: ELVAN SOR. MD [facility] 04/14/2024 4:26 PM  To reach On-call, see care teams to locate the attending and reach out to them via [email or web address]. If 7PM-7AM, please contact night-coverage If you still have difficulty reaching the attending provider, please page the [facility] (Director on Call) for [facility] on amion for assistance.

## 2024-04-15 ENCOUNTER — Inpatient Hospital Stay

## 2024-04-15 DIAGNOSIS — A419 Sepsis, unspecified organism: Secondary | ICD-10-CM | POA: Diagnosis not present

## 2024-04-15 DIAGNOSIS — R6520 Severe sepsis without septic shock: Secondary | ICD-10-CM | POA: Diagnosis not present

## 2024-04-15 LAB — MISC LABCORP TEST (SEND OUT): Labcorp test code: 81950

## 2024-04-15 LAB — MAGNESIUM: Magnesium: 2 mg/dL (ref 1.7–2.4)

## 2024-04-15 LAB — CBC
HCT: 29.8 % — ABNORMAL LOW (ref 36.0–46.0)
Hemoglobin: 9.8 g/dL — ABNORMAL LOW (ref 12.0–15.0)
MCH: 33.6 pg (ref 26.0–34.0)
MCHC: 32.9 g/dL (ref 30.0–36.0)
MCV: 102.1 fL — ABNORMAL HIGH (ref 80.0–100.0)
Platelets: 382 K/uL (ref 150–400)
RBC: 2.92 MIL/uL — ABNORMAL LOW (ref 3.87–5.11)
RDW: 13.4 % (ref 11.5–15.5)
WBC: 32.4 K/uL — ABNORMAL HIGH (ref 4.0–10.5)
nRBC: 0 % (ref 0.0–0.2)

## 2024-04-15 LAB — BASIC METABOLIC PANEL WITH GFR
Anion gap: 8 (ref 5–15)
BUN: 37 mg/dL — ABNORMAL HIGH (ref 8–23)
CO2: 27 mmol/L (ref 22–32)
Calcium: 9 mg/dL (ref 8.9–10.3)
Chloride: 88 mmol/L — ABNORMAL LOW (ref 98–111)
Creatinine, Ser: 1.26 mg/dL — ABNORMAL HIGH (ref 0.44–1.00)
GFR, Estimated: 47 mL/min — ABNORMAL LOW (ref >60)
Glucose, Bld: 115 mg/dL — ABNORMAL HIGH (ref 70–99)
Potassium: 5.1 mmol/L (ref 3.5–5.1)
Sodium: 123 mmol/L — ABNORMAL LOW (ref 135–145)

## 2024-04-15 LAB — PHOSPHORUS: Phosphorus: 3.3 mg/dL (ref 2.5–4.6)

## 2024-04-15 LAB — LACTIC ACID, PLASMA: Lactic Acid, Venous: 1.1 mmol/L (ref 0.5–1.9)

## 2024-04-15 LAB — C-REACTIVE PROTEIN: CRP: 10.2 mg/dL — ABNORMAL HIGH (ref <1.0)

## 2024-04-15 LAB — LEGIONELLA PNEUMOPHILA SEROGP 1 UR AG: L. pneumophila Serogp 1 Ur Ag: NEGATIVE

## 2024-04-15 MED ORDER — HYDROCORTISONE 1 % EX CREA
TOPICAL_CREAM | Freq: Three times a day (TID) | CUTANEOUS | Status: DC | PRN
  Filled 2024-04-15: qty 28

## 2024-04-15 MED ORDER — CYANOCOBALAMIN 500 MCG PO TABS
500.0000 ug | ORAL_TABLET | Freq: Every day | ORAL | Status: DC
  Administered 2024-04-15 – 2024-04-18 (×4): 500 ug via ORAL
  Filled 2024-04-15 (×4): qty 1

## 2024-04-15 MED ORDER — METHYLPREDNISOLONE SODIUM SUCC 40 MG IJ SOLR
40.0000 mg | Freq: Two times a day (BID) | INTRAMUSCULAR | Status: DC
Start: 2024-04-15 — End: 2024-04-16
  Administered 2024-04-15 – 2024-04-16 (×2): 40 mg via INTRAVENOUS
  Filled 2024-04-15 (×2): qty 1

## 2024-04-15 MED ORDER — VITAMIN D (ERGOCALCIFEROL) 1.25 MG (50000 UNIT) PO CAPS
50000.0000 [IU] | ORAL_CAPSULE | ORAL | Status: DC
Start: 2024-04-15 — End: 2024-04-18
  Administered 2024-04-15: 50000 [IU] via ORAL
  Filled 2024-04-15: qty 1

## 2024-04-15 MED ORDER — SODIUM CHLORIDE 1 G PO TABS
1.0000 g | ORAL_TABLET | Freq: Two times a day (BID) | ORAL | Status: DC
Start: 2024-04-15 — End: 2024-04-18
  Administered 2024-04-15: 1 g via ORAL
  Filled 2024-04-15: qty 1

## 2024-04-15 NOTE — Progress Notes (Signed)
 PULMONOLOGY         Date: 04/15/2024,   MRN# [id number] Regina Bernard August 19, 1955     AdmissionWeight: 49.4 kg                 CurrentWeight: 49.4 kg  Referring provider: Dr Von   CHIEF COMPLAINT:   Acute exacerbation of COPD   HISTORY OF PRESENT ILLNESS   Is a 68 year old female with a history of asthma and COPD overlap syndrome, history of right sided breast cancer, endometriosis with hysterectomy, chronic hep C with Polly substance abuse history, essential hypertension, peripheral neuropathy, chronic use of aromatase inhibitor therapy and chronic hypoxemia utilizing 4 to 5 L/min of supplemental oxygen  at baseline.  She reports having few days of worsening respiratory symptoms with cough and in the field on arrival was noted to be hypoxemic at 72% with respiratory distress and accessory muscle use.  She was placed on BiPAP support with subsequent improvement however continues to show signs of distress..  Chest x-ray performed shows acute on chronic changes with history of breast cancer and cavitary lung disease as well as advanced COPD.  PCCM consultation placed for additional evaluation and management of patient with complex chronic lung disease with acute on chronic hypoxemic respiratory failure and respiratory distress requiring NIV support.  04/15/24- patient s/p CT chest with progressive cavitary LUL lesion and saccular bronchiectasis.  She has not had bronchoscopy in the past and we discussed doing this on outpatient basis and she is agreeable to this. We weaned steroids to 40mg  IV BID. She remains on zithromax  and rocephin . She is improving and we can plan for dc in 48hr.   PAST MEDICAL HISTORY   Past Medical History:  Diagnosis Date   Abnormal CT scan, chest    Anxiety    Asthma    Breast cancer (HCC)    Breast CA- Right    Breast cancer (HCC) 06/18/2011   right breast cancer   COPD (chronic obstructive pulmonary disease) (HCC)    Endometriosis    tx with  vaginal hysterectomy   Fatty liver    Hepatitis C 1998   [facility] trial treatment in-patient study 2005. HCV 740, 05/20/11.   History of substance abuse (HCC)    Heroine, cocaine, marijuana. States all of them. Heavy use 1995 to 2000. Occasional use before and after. None since 2006.    Hot flashes related to aromatase inhibitor therapy    Hypertension    Lung mass    Peripheral neuropathy    in hands and feet     SURGICAL HISTORY   Past Surgical History:  Procedure Laterality Date   BREAST LUMPECTOMY Right 2013   COLONOSCOPY WITH PROPOFOL  N/A 03/20/2022   Procedure: COLONOSCOPY WITH PROPOFOL ;  Surgeon: Unk Corinn Skiff, MD;  Location: [facility];  Service: Gastroenterology;  Laterality: N/A;   ESOPHAGOGASTRODUODENOSCOPY (EGD) WITH PROPOFOL  N/A 10/29/2021   Procedure: ESOPHAGOGASTRODUODENOSCOPY (EGD) WITH PROPOFOL ;  Surgeon: Unk Corinn Skiff, MD;  Location: [facility] ENDOSCOPY;  Service: Gastroenterology;  Laterality: N/A;   ESOPHAGOGASTRODUODENOSCOPY (EGD) WITH PROPOFOL  N/A 02/14/2022   Procedure: ESOPHAGOGASTRODUODENOSCOPY (EGD) WITH PROPOFOL ;  Surgeon: Unk Corinn Skiff, MD;  Location: [facility] ENDOSCOPY;  Service: Gastroenterology;  Laterality: N/A;  DRIVER 5 - 10 MINUTES AWAY   INTRAMEDULLARY (IM) NAIL INTERTROCHANTERIC Left 04/26/2023   Procedure: INTRAMEDULLARY (IM) NAIL INTERTROCHANTERIC;  Surgeon: Cleotilde Barrio, MD;  Location: [facility] ORS;  Service: Orthopedics;  Laterality: Left;   TUBAL LIGATION  1996   Upper right leg benign  tumor removed     VAGINAL HYSTERECTOMY  2001     FAMILY HISTORY   Family History  Problem Relation Age of Onset   Diabetes Maternal Grandmother    Lung cancer Father    Lung cancer Mother    Hypertension Mother    Heart disease Mother    Skin cancer Mother    Breast cancer Neg Hx      SOCIAL HISTORY   Social History   Tobacco Use   Smoking status: Every Day    Types: E-cigarettes   Smokeless tobacco: Never   Tobacco comments:    last  cigeratte use 6 days ago (11/30/15)  Vaping Use   Vaping status: Every Day  Substance Use Topics   Alcohol use: Yes    Comment: occ   Drug use: No     MEDICATIONS    Home Medication:     Current Medication:  Current Facility-Administered Medications:    acetaminophen  (TYLENOL ) tablet 650 mg, 650 mg, Oral, Q6H PRN, 650 mg at 04/14/24 2212 **OR** acetaminophen  (TYLENOL ) suppository 650 mg, 650 mg, Rectal, Q6H PRN, Fausto Sor A, DO   ascorbic acid (VITAMIN C) tablet 500 mg, 500 mg, Oral, Daily, Von Bellis, MD, 500 mg at 04/14/24 1931   azithromycin  (ZITHROMAX ) 500 mg in sodium chloride  0.9 % 250 mL IVPB, 500 mg, Intravenous, Q24H, Hunt, Madison VEAR, RPH, Stopped at 04/14/24 1613   budesonide -glycopyrrolate -formoterol (BREZTRI) 160-9-4.8 MCG/ACT inhaler 2 puff, 2 puff, Inhalation, BID, Fausto Sor A, DO, 2 puff at 04/14/24 0943   cefTRIAXone  (ROCEPHIN ) 2 g in sodium chloride  0.9 % 100 mL IVPB, 2 g, Intravenous, Q24H, Hunt, Madison H, RPH, Stopped at 04/14/24 1456   DULoxetine  (CYMBALTA ) DR capsule 30 mg, 30 mg, Oral, BID, Fausto Sor A, DO, 30 mg at 04/14/24 2202   enoxaparin  (LOVENOX ) injection 40 mg, 40 mg, Subcutaneous, Q24H, Fausto Sor A, DO, 40 mg at 04/14/24 2203   feeding supplement (ENSURE ENLIVE / ENSURE PLUS) liquid 237 mL, 237 mL, Oral, BID BM, Fausto Sor A, DO, 237 mL at 04/14/24 1430   folic acid (FOLVITE) tablet 1 mg, 1 mg, Oral, Daily, Von Bellis, MD, 1 mg at 04/14/24 1931   furosemide  (LASIX ) tablet 20 mg, 20 mg, Oral, Daily, Fausto Sor A, DO, 20 mg at 04/14/24 9060   gabapentin  (NEURONTIN ) capsule 300 mg, 300 mg, Oral, TID, Fausto Sor A, DO, 300 mg at 04/14/24 2202   guaiFENesin  (MUCINEX ) 12 hr tablet 600 mg, 600 mg, Oral, BID, Fausto Sor A, DO, 600 mg at 04/14/24 2202   ipratropium-albuterol  (DUONEB) 0.5-2.5 (3) MG/3ML nebulizer solution 3 mL, 3 mL, Nebulization, Q6H, Griffith, Kelly A, DO, 3 mL at 04/15/24 0801   iron   polysaccharides (NIFEREX) capsule 150 mg, 150 mg, Oral, Daily, Von Bellis, MD, 150 mg at 04/14/24 1932   lactated ringers  infusion, , Intravenous, Continuous, Rusty Glodowski, MD   lamoTRIgine  (LAMICTAL ) tablet 100 mg, 100 mg, Oral, Daily, Fausto Sor A, DO, 100 mg at 04/14/24 1037   levalbuterol (XOPENEX) nebulizer solution 0.63 mg, 0.63 mg, Nebulization, Q3H PRN, Fausto Sor A, DO   levothyroxine (SYNTHROID) tablet 50 mcg, 50 mcg, Oral, QAC breakfast, Fausto Sor A, DO, 50 mcg at 04/15/24 0529   methylPREDNISolone  sodium succinate  (SOLU-MEDROL ) 125 mg/2 mL injection 60 mg, 60 mg, Intravenous, Q12H, Fausto Sor A, DO, 60 mg at 04/14/24 2203   metoprolol  succinate (TOPROL -XL) 24 hr tablet 100 mg, 100 mg, Oral, Daily, Fausto Sor A, DO, 100 mg at 04/14/24 [phone]  ondansetron  (ZOFRAN ) tablet 4 mg, 4 mg, Oral, Q6H PRN **OR** ondansetron  (ZOFRAN ) injection 4 mg, 4 mg, Intravenous, Q6H PRN, Fausto Sor A, DO   polyethylene glycol (MIRALAX  / GLYCOLAX ) packet 17 g, 17 g, Oral, Daily PRN, Fausto Sor A, DO   QUEtiapine  (SEROQUEL ) tablet 25 mg, 25 mg, Oral, QHS PRN, Fausto Sor A, DO, 25 mg at 04/14/24 2202   sorbitol 70 % solution 30 mL, 30 mL, Oral, Daily PRN, Fausto Sor A, DO    ALLERGIES   Penicillins     REVIEW OF SYSTEMS    Review of Systems:  Gen:  Denies  fever, sweats, chills weigh loss  HEENT: Denies blurred vision, double vision, ear pain, eye pain, hearing loss, nose bleeds, sore throat Cardiac:  No dizziness, chest pain or heaviness, chest tightness,edema Resp:   reports dyspnea chronically  Gi: Denies swallowing difficulty, stomach pain, nausea or vomiting, diarrhea, constipation, bowel incontinence Gu:  Denies bladder incontinence, burning urine Ext:   Denies Joint pain, stiffness or swelling Skin: Denies  skin rash, easy bruising or bleeding or hives Endoc:  Denies polyuria, polydipsia , polyphagia or weight change Psych:   Denies  depression, insomnia or hallucinations   Other:  All other systems negative   VS: BP (!) 146/74 (BP Location: Right Arm)   Pulse 80   Temp 97.7 F (36.5 C)   Resp 20   Ht 5' 3 (1.6 m)   Wt 49.4 kg   SpO2 98%   BMI 19.31 kg/m      PHYSICAL EXAM    GENERAL:NAD, no fevers, chills, no weakness no fatigue HEAD: Normocephalic, atraumatic.  EYES: Pupils equal, round, reactive to light. Extraocular muscles intact. No scleral icterus.  MOUTH: Moist mucosal membrane. Dentition intact. No abscess noted.  EAR, NOSE, THROAT: Clear without exudates. No external lesions.  NECK: Supple. No thyromegaly. No nodules. No JVD.  PULMONARY: decreased breath sounds with mild rhonchi worse at bases bilaterally.  CARDIOVASCULAR: S1 and S2. Regular rate and rhythm. No murmurs, rubs, or gallops. No edema. Pedal pulses 2+ bilaterally.  GASTROINTESTINAL: Soft, nontender, nondistended. No masses. Positive bowel sounds. No hepatosplenomegaly.  MUSCULOSKELETAL: No swelling, clubbing, or edema. Range of motion full in all extremities.  NEUROLOGIC: Cranial nerves II through XII are intact. No gross focal neurological deficits. Sensation intact. Reflexes intact.  SKIN: No ulceration, lesions, rashes, or cyanosis. Skin warm and dry. Turgor intact.  PSYCHIATRIC: Mood, affect within normal limits. The patient is awake, alert and oriented x 3. Insight, judgment intact.       IMAGING   CLINICAL DATA:  Severe shortness of breath.   EXAM: PORTABLE CHEST 1 VIEW   COMPARISON:  Radiograph and CT 04/25/2023   FINDINGS: Multifocal pulmonary opacities in the right mid lower lung zone suspicious for pneumonia. Background emphysema with chronic scarring and bulla in the lateral left upper lobe. Mediastinal contours are distorted due due to scoliosis. The heart is normal in size with aortic atherosclerosis and tortuosity. No significant pleural effusion. Thoracic scoliosis.   IMPRESSION: 1. Multifocal pulmonary  opacities in the right mid lower lung zone suspicious for pneumonia. 2. Background emphysema with chronic scarring and bulla in the lateral left upper lobe.     Electronically Signed   By: Andrea Gasman M.D.   On: 04/13/2024 14:10  ASSESSMENT/PLAN     Community acquired pneumonia    - RVP- negative    - procalcitonin trend    - CRP - 10    - empiric  therapy with zithromax  and rocephin  IV    - MRSA prcr     - AFB resp cultures     - fungitell    - legionella     - strep pneumoniae ur ag    - histoplasma ur ag    - gram stain and culture sputum     -there is mild AKI precluding CT chest with IV contrast , will give LR and consider in am to perform contrasted CT chest     Acute exacerbation of COPD - moderate  -continue nebulizer therapy with xopenex -agree with steroids - currently at 60mg  BID solumedrol -Incentive spirometry  -PT/OT    AKI - KDIGO 2 -suspect this is due to ischemia with acute on chronic hypoxemia -will rehydrate with LR and recheck BMP in am -hopefully we can get CT chest with contrast after renal indices are within reference range.   Thank you for allowing me to participate in the care of this patient.   Patient/Family are satisfied with care plan and all questions have been answered.    Provider disclosure: Patient with at least one acute or chronic illness or injury that poses a threat to life or bodily function and is being managed actively during this encounter.  All of the below services have been performed independently by signing provider:  review of prior documentation from internal and or external health records.  Review of previous and current lab results.  Interview and comprehensive assessment during patient visit today. Review of current and previous chest radiographs/CT scans. Discussion of management and test interpretation with health care team and patient/family.   This document was prepared using Dragon voice recognition  software and may include unintentional dictation errors.     Corene Resnick, M.D.  Division of Pulmonary & Critical Care Medicine

## 2024-04-15 NOTE — Evaluation (Signed)
 Physical Therapy Evaluation Patient Details Name: Regina Bernard MRN: [id number] DOB: 01-Sep-1955 Today's Date: 04/15/2024  History of Present Illness  Regina Bernard is a 68 y.o. female with medical history significant of advanced COPD on chronic prednisone  and 4-5 L/min O2 at baseline, history of breast cancer, hypertension who presented to the ED today by EMS for evaluation of progressively worsening shortness of breath.   Clinical Impression  Patient received in recliner. Spouse at bedside. She reports multiple falls and weakness due to decreased activity. Patient is limited by sob with mobility. She will continue to benefit from skilled PT to improve activity tolerance, strength and safety with mobility.          If plan is discharge home, recommend the following: A lot of help with walking and/or transfers;A lot of help with bathing/dressing/bathroom   Can travel by private vehicle    yes    Equipment Recommendations None recommended by PT  Recommendations for Other Services       Functional Status Assessment Patient has had a recent decline in their functional status and demonstrates the ability to make significant improvements in function in a reasonable and predictable amount of time.     Precautions / Restrictions Precautions Precautions: Fall Recall of Precautions/Restrictions: Intact Restrictions Weight Bearing Restrictions Per Provider Order: No      Mobility  Bed Mobility Overal bed mobility: Modified Independent                  Transfers Overall transfer level: Needs assistance Equipment used: None Transfers: Sit to/from Stand Sit to Stand: Contact guard assist                Ambulation/Gait               General Gait Details: NT due to SOB with minimal activity  Stairs            Wheelchair Mobility     Tilt Bed    Modified Rankin (Stroke Patients Only)       Balance Overall balance assessment: Needs  assistance Sitting-balance support: Feet supported Sitting balance-Leahy Scale: Good     Standing balance support: Bilateral upper extremity supported, During functional activity, Reliant on assistive device for balance Standing balance-Leahy Scale: Poor Standing balance comment: reports multiple falls                             Pertinent Vitals/Pain Pain Assessment Pain Assessment: No/denies pain    Home Living Family/patient expects to be discharged to:: Private residence Living Arrangements: Spouse/significant other Available Help at Discharge: Available PRN/intermittently;Family Type of Home: House Home Access: Stairs to enter Entrance Stairs-Rails: Right Entrance Stairs-Number of Steps: 3   Home Layout: One level Home Equipment: Agricultural consultant (2 wheels);Rollator (4 wheels);Shower seat;Grab bars - toilet;Grab bars - tub/shower Additional Comments: 2L 02 at baseline    Prior Function Prior Level of Function : Independent/Modified Independent             Mobility Comments: uses rollator MOD I; reports husband brings MWC to car for MD appts, etc. ADLs Comments: MOD I ADLs and reports husband manages IADLs     Extremity/Trunk Assessment   Upper Extremity Assessment Upper Extremity Assessment: Defer to OT evaluation    Lower Extremity Assessment Lower Extremity Assessment: Generalized weakness    Cervical / Trunk Assessment Cervical / Trunk Assessment: Normal  Communication   Communication Communication: No apparent difficulties  Cognition Arousal: Alert Behavior During Therapy: WFL for tasks assessed/performed   PT - Cognitive impairments: No apparent impairments                         Following commands: Intact       Cueing Cueing Techniques: Verbal cues     General Comments      Exercises     Assessment/Plan    PT Assessment Patient needs continued PT services  PT Problem List Decreased strength;Decreased  activity tolerance;Decreased balance;Decreased mobility;Cardiopulmonary status limiting activity;Decreased safety awareness       PT Treatment Interventions DME instruction;Gait training;Stair training;Functional mobility training;Therapeutic activities;Therapeutic exercise;Balance training;Patient/family education    PT Goals (Current goals can be found in the Care Plan section)  Acute Rehab PT Goals Patient Stated Goal: get stronger, stop falling PT Goal Formulation: With patient Time For Goal Achievement: 04/29/24 Potential to Achieve Goals: Fair    Frequency Min 2X/week     Co-evaluation               AM-PAC PT 6 Clicks Mobility  Outcome Measure Help needed turning from your back to your side while in a flat bed without using bedrails?: None Help needed moving from lying on your back to sitting on the side of a flat bed without using bedrails?: None Help needed moving to and from a bed to a chair (including a wheelchair)?: A Little Help needed standing up from a chair using your arms (e.g., wheelchair or bedside chair)?: A Little Help needed to walk in hospital room?: A Lot Help needed climbing 3-5 steps with a railing? : A Lot 6 Click Score: 18    End of Session Equipment Utilized During Treatment: Oxygen  Activity Tolerance: Patient limited by fatigue;Other (comment) (SOB) Patient left: in bed;with call bell/phone within reach;with bed alarm set;with family/visitor present Nurse Communication: Mobility status PT Visit Diagnosis: Other abnormalities of gait and mobility (R26.89);Unsteadiness on feet (R26.81);Repeated falls (R29.6);Muscle weakness (generalized) (M62.81);Difficulty in walking, not elsewhere classified (R26.2)    Time: 9059-9048 PT Time Calculation (min) (ACUTE ONLY): 11 min   Charges:   PT Evaluation $PT Eval Low Complexity: 1 Low   PT General Charges $$ ACUTE PT VISIT: 1 Visit         Tahmir Kleckner, PT, GCS 04/15/24,10:02 AM

## 2024-04-15 NOTE — Progress Notes (Signed)
 Patient called out and c/o pain and itching at IV site. RN went to room and assessed site and above IV site red and swollen. Removed IV. Notified Wal[facility], [facility] of IV infiltration with azithromycin  most recent to finish infusing at site. [facility] stated  No antidote needed/recommended, use a warm compress and re-assess area in 10 mins. RN applied warm compress and patient refused stating it made it hurt worse. Patient asked for cold cloth and cool cloth applied per patient's request. Notified Dr. Von of infiltration and that patient is c/o itching asking for hydrocortisone  cream. MD acknowledged and placed order. Patient's left arm elevated on pillow.

## 2024-04-15 NOTE — Care Management Important Message (Signed)
 Important Message  Patient Details  Name: Regina Bernard MRN: [id number] Date of Birth: 1955/09/05   Important Message Given:  Yes - Medicare IM     [name] 04/15/2024, 12:50 PM

## 2024-04-15 NOTE — Evaluation (Signed)
 Occupational Therapy Evaluation Patient Details Name: Regina Bernard MRN: [id number] DOB: 01/16/1956 Today's Date: 04/15/2024   History of Present Illness   Pt is a 68 y.o. female who presented to the ED today by EMS for evaluation of progressively worsening shortness of breath, chills, productive cough, diminished appetite with poor p.o. intake, feeling dehydrated. Current MD assessment: Severe sepsis due to multifocal pneumonia, Acute on chronic respiratory failure with hypoxia, advanced COPD. PMH of advanced COPD on chronic prednisone  and 4-5 L/min O2 at baseline, history of breast cancer, hypertension     Clinical Impressions Pt was seen for OT evaluation this date. PTA, pt resides in a one level home with 3 STE with her husband.At baseline, she is MOD I/IND with ADLs and husband manages IADLs. She reports use of rollator in the home and use of [facility] when out for MD appts. Only ambulates household distances. Husband works 3rd shift and is home during the day.  Pt presents with deficits in strength, balance and activity tolerance, affecting safe and optimal ADL completion. Pt currently requires MOD I for bed mobility. Linens soiled from purewick malfunction, therefore pt demo peri-care/clean-up in standing at EOB with CGA for safety. Able to stand with CGA and SPT to recliner with Min/CGA for safety d/t first time being up since being here. She performed LB dressing tasks with Mod A. Drop in sp02 to 85% on 2L with activity, required 3L 02 to improve to 94%. Edu on pacing, PLB and ECS which pt demo fair carryover with these strategies. Pt would benefit from skilled OT services to address noted impairments and functional limitations to maximize safety and independence while minimizing future risk of falls, injury, and readmission. Do anticipate the need for follow up OT services upon acute hospital DC.       If plan is discharge home, recommend the following:   A little help with walking  and/or transfers;A little help with bathing/dressing/bathroom;Assistance with cooking/housework;Help with stairs or ramp for entrance     Functional Status Assessment   Patient has had a recent decline in their functional status and demonstrates the ability to make significant improvements in function in a reasonable and predictable amount of time.     Equipment Recommendations   BSC/3in1     Recommendations for Other Services         Precautions/Restrictions   Precautions Precautions: Fall Recall of Precautions/Restrictions: Intact Restrictions Weight Bearing Restrictions Per Provider Order: No     Mobility Bed Mobility Overal bed mobility: Modified Independent                  Transfers Overall transfer level: Needs assistance Equipment used: Rolling walker (2 wheels) Transfers: Sit to/from Stand, Bed to chair/wheelchair/BSC Sit to Stand: Contact guard assist     Step pivot transfers: Contact guard assist, Min assist     General transfer comment: cues for hand placement, pacing/ECS, able to stand to perform LB bathing/peri-care and SPT to recliner with Min/CGA with mild unsteadiness      Balance Overall balance assessment: Needs assistance Sitting-balance support: Feet supported Sitting balance-Leahy Scale: Good     Standing balance support: Bilateral upper extremity supported, During functional activity, Reliant on assistive device for balance Standing balance-Leahy Scale: Poor Standing balance comment: Min/CGA with RW use with high fall risk d/t fatigueing easily                           ADL either  performed or assessed with clinical judgement   ADL Overall ADL's : Needs assistance/impaired     Grooming: Wash/dry face;Sitting;Set up               Lower Body Dressing: Moderate assistance;Sitting/lateral leans;Sit to/from stand   Toilet Transfer: Rolling walker (2 wheels);Stand-pivot;Minimal assistance;Contact guard  assist Toilet Transfer Details (indicate cue type and reason): simulated from bed to recliner Toileting- Clothing Manipulation and Hygiene: Contact guard assist;Sit to/from stand Toileting - Clothing Manipulation Details (indicate cue type and reason): standing at [employer]         Perception         Praxis         Pertinent Vitals/Pain Pain Assessment Pain Assessment: No/denies pain     Extremity/Trunk Assessment Upper Extremity Assessment Upper Extremity Assessment: Generalized weakness   Lower Extremity Assessment Lower Extremity Assessment: Generalized weakness       Communication Communication Communication: No apparent difficulties   Cognition Arousal: Alert Behavior During Therapy: WFL for tasks assessed/performed Cognition: No apparent impairments                               Following commands: Intact       Cueing  General Comments   Cueing Techniques: Verbal cues  fatigue easily, dropped to 85% on 2L with SPT to recliner; requires cues for PLB/ECS; improved to 94% on 3L   Exercises Other Exercises Other Exercises: Edu on role of OT in acute setting, ECS, PLB, pacing and task modification to implement during ADL performance/transfers.   Shoulder Instructions      Home Living Family/patient expects to be discharged to:: Private residence Living Arrangements: Spouse/significant other Available Help at Discharge: Available PRN/intermittently;Family (husband works 3rd shift and sleeps most of the day) Type of Home: House Home Access: Stairs to enter [employer] of Steps: 3 Entrance Stairs-Rails: Right Home Layout: One level     Bathroom Shower/Tub: Chief Strategy Officer: Standard Bathroom Accessibility: No   Home Equipment: Agricultural consultant (2 wheels);Rollator (4 wheels);Shower seat;Grab bars - toilet;Grab bars - tub/shower   Additional Comments: 2L 02 at baseline      Prior  Functioning/Environment Prior Level of Function : Independent/Modified Independent             Mobility Comments: uses rollator MOD I; reports husband brings MWC to car for MD appts, etc. ADLs Comments: MOD I ADLs and reports husband manages IADLs    OT Problem List: Decreased activity tolerance;Decreased strength   OT Treatment/Interventions: Self-care/ADL training;Therapeutic exercise;Therapeutic activities;Energy conservation;DME and/or AE instruction;Patient/family education;Balance training      OT Goals(Current goals can be found in the care plan section)   Acute Rehab OT Goals Patient Stated Goal: improve function/strength OT Goal Formulation: With patient Time For Goal Achievement: 04/29/24 Potential to Achieve Goals: Good ADL Goals Pt Will Perform Lower Body Dressing: with modified independence;with set-up;sitting/lateral leans;sit to/from stand Pt Will Transfer to Toilet: with modified independence;with supervision;regular height toilet;ambulating Additional ADL Goal #1: Pt will demo implementation of 1 learned ECS during ADL performance to maximize safety/IND and prevent overexertion.   OT Frequency:  Min 2X/week    Co-evaluation              AM-PAC OT 6 Clicks Daily Activity     Outcome Measure Help from another person eating meals?: None Help from another  person taking care of personal grooming?: A Little Help from another person toileting, which includes using toliet, bedpan, or urinal?: A Little Help from another person bathing (including washing, rinsing, drying)?: A Little Help from another person to put on and taking off regular upper body clothing?: A Little Help from another person to put on and taking off regular lower body clothing?: A Little 6 Click Score: 19   End of Session Equipment Utilized During Treatment: Rolling walker (2 wheels);Oxygen  Nurse Communication: Mobility status  Activity Tolerance: Patient tolerated treatment  well;Patient limited by fatigue Patient left: in chair;with call bell/phone within reach;with chair alarm set  OT Visit Diagnosis: Other abnormalities of gait and mobility (R26.89);Muscle weakness (generalized) (M62.81)                Time: 9192-9160 OT Time Calculation (min): 32 min Charges:  OT General Charges $OT Visit: 1 Visit OT Evaluation $OT Eval Low Complexity: 1 Low OT Treatments $Self Care/Home Management : 8-22 mins Lindell Renfrew, OTR/L 04/15/24, 2:01 PM  Emani Taussig E Hildreth Robart 04/15/2024, 2:01 PM

## 2024-04-15 NOTE — Progress Notes (Signed)
 [facility] Progress Note  Patient: Regina Bernard    FMW:[id number]  DOA: 04/13/2024     Date of Service: the patient was seen and examined on 04/15/2024  Chief Complaint  Patient presents with   Shortness of Breath   Brief hospital course: JYASIA MARKOFF is a 68 y.o. female with medical history significant of advanced COPD on chronic prednisone  and 4-5 L/min O2 at baseline, history of breast cancer, hypertension who presented to the ED today by EMS for evaluation of progressively worsening shortness of breath.  Patient reports about 3 days difficulty breathing getting worse.  She denies fevers but reports chills, also reports productive cough, diminished appetite with poor p.o. intake, feeling dehydrated.  She notes her baseline dyspnea seem to get worse several weeks ago.  She denies abdominal pain nausea vomiting diarrhea, headache dizziness, unilateral weakness numbness or tingling, hematuria dysuria or other symptoms.  No known sick contacts.  EMS was reportedly called yesterday but patient refused transfer to the hospital at that time.  On arrival to the ED today, patient was on nonrebreather mask with reported O2 sat of only 72% and in respiratory distress.  Patient was emergently placed on BiPAP with improved O2 sats and work of breathing.   ED course: Temp 98.4 F, initial HR 140, RR 30, BP 133/84, SpO2 100% on BiPAP. Labs obtained including CMP and CBC were notable for sodium 129, chloride 87, nonfasting glucose 100, alk phos 137, albumin 3.4, WBC significantly elevated 28.4, hemoglobin 11.5.  BNP was 127.3.  Troponin normal 14.  Venous blood gas pH 7.42, pCO2 normal 59.     Chest x-ray showed multifocal pulmonary opacities in the right mid and lower lung zone suspicious for pneumonia with background emphysema and chronic scarring and bulla in the lateral left upper lobe.   Patient is being admitted to progressive care unit for further evaluation and management of acute on chronic  respiratory failure with hypoxia in the setting of sepsis due to community-acquired pneumonia.    Assessment and Plan:  # Severe sepsis due to multifocal pneumonia Severe sepsis POA as evidenced by tachycardia, tachypnea, leukocytosis, organ dysfunction evidenced by acute on chronic respiratory failure. S/p IV Levaquin  given in the ED.  # Acute on chronic respiratory failure with hypoxia Advanced COPD -on chronic prednisone , on 4-5 L/min baseline oxygen .  Presented in respiratory distress with O2 sat 72% on nonrebreather mask reported by EDP -- s/p IVF due to sepsis, given history of CHF -1 L bolus -- Patient has penicillin allergy but has tolerated cephalosporins -- IV antibiotics with Rocephin  and Zithromax  -- IV Solu-Medrol  60 mg twice daily -- Scheduled Mucinex  -- Follow blood cultures, 1/4 GPC most likely contamination -- Collect sputum culture  -- Check antigens for strep pneumo and Legionella -- Continuous pulse ox -- BiPAP as needed -- Supplemental O2, maintain sats 88 to 94% -- Scheduled DuoNebs, as needed Xopenex -- Substitute Breztri for home Trelegy -- Pulmonary consult appreciated -- N.p.o. while on BiPAP, may take short breaks for ice chips --RVP panel negative Follow CT chest, report is pending   Chronic HFpEF -appears euvolemic, compensated.  Echo in September EF 55%, grade 1 diastolic dysfunction Essential hypertension -- Continue home Lasix   -- Continue home metoprolol  -- Daily weights to monitor volume status   Hypothyroidism -continue home Synthroid   Neuropathy -continue home gabapentin    Depression/anxiety -continue home Cymbalta  Insomnia -continue home Seroquel    Anemia secondary to iron  deficiency. Tsat 9%, started oral iron   supplement with vitamin C.  Follow with PCP to repeat iron  profile after 3 to 6 months. Folic acid level 7.6, at lower end.  Started folic acid 1 mg p.o. daily. B12 level 431, within normal range  Vitamin D deficiency:  started vitamin D 50,000 units p.o. weekly, follow with PCP to repeat vitamin D level after 3 to 6 months.  Isotonic hyponatremia,  Serum osmolality 281 Na 129--123 Started sodium chloride  1 g p.o. twice daily Check sodium level daily   Body mass index is 19.31 kg/m.  Interventions:   Diet: Regular diet DVT Prophylaxis: Subcutaneous Lovenox    Advance goals of care discussion: Full code  Family Communication: family was not present at bedside, at the time of interview.  The pt provided permission to discuss medical plan with the family. Opportunity was given to ask question and all questions were answered satisfactorily.   Disposition:  Pt is from Home, admitted with Resp failure, PNA sepsis, still has resp failure on IV Abx, which precludes a safe discharge. Discharge to Home, when stable, may need few days to improve.  Subjective: No significant events overnight, patient still having significant productive cough, feels chest congestion, breathing is improving gradually, no chest pain or pressure.   Physical Exam: General: Mild respiratory distress, short of breath  Appear in mild distress, affect appropriate Eyes: PERRLA ENT: Oral Mucosa Clear, moist  Neck: no JVD,  Cardiovascular: S1 and S2 Present, no Murmur,  Respiratory: Increased respiratory effort, significant crackles and wheezing bilaterally.   Abdomen: Bowel Sound present, Soft and no tenderness,  Skin: no rashes Extremities: no Pedal edema, no calf tenderness Neurologic: without any new focal findings Gait not checked due to patient safety concerns  Vitals:   04/15/24 1051 04/15/24 1200 04/15/24 1345 04/15/24 1602  BP: 139/71 (!) 142/69  (!) 147/89  Pulse: 84   81  Resp:  16  15  Temp:  98.6 F (37 C)  98 F (36.7 C)  TempSrc:  Oral  Oral  SpO2:  94% 95% 100%  Weight:      Height:        Intake/Output Summary (Last 24 hours) at 04/15/2024 1621 Last data filed at 04/15/2024 1600 Gross per 24 hour   Intake 933.53 ml  Output --  Net 933.53 ml   Filed Weights   04/13/24 1238  Weight: 49.4 kg    Data Reviewed: I have personally reviewed and interpreted daily labs, tele strips, imagings as discussed above. I reviewed all nursing notes, pharmacy notes, vitals, pertinent old records I have discussed plan of care as described above with RN and patient/family.  CBC: Recent Labs  Lab 04/13/24 1248 04/14/24 0224 04/15/24 0420  WBC 28.4* 18.4* 32.4*  NEUTROABS 25.9*  --   --   HGB 11.5* 10.0* 9.8*  HCT 33.9* 30.0* 29.8*  MCV 100.3* 101.4* 102.1*  PLT 368 318 382   Basic Metabolic Panel: Recent Labs  Lab 04/13/24 1248 04/14/24 0224 04/15/24 0420  NA 129* 129* 123*  K 4.4 4.3 5.1  CL 87* 91* 88*  CO2 30 27 27   GLUCOSE 100* 187* 115*  BUN 21 21 37*  CREATININE 0.82 1.16* 1.26*  CALCIUM 9.3 9.2 9.0  MG 1.8 1.7 2.0  PHOS  --  2.4* 3.3    Studies: No results found.  Scheduled Meds:  vitamin C  500 mg Oral Daily   budesonide -glycopyrrolate -formoterol  2 puff Inhalation BID   vitamin B-12  500 mcg Oral Daily   DULoxetine   30 mg Oral BID   enoxaparin  (LOVENOX ) injection  40 mg Subcutaneous Q24H   feeding supplement  237 mL Oral BID BM   folic acid  1 mg Oral Daily   furosemide   20 mg Oral Daily   gabapentin   300 mg Oral TID   guaiFENesin   600 mg Oral BID   ipratropium-albuterol   3 mL Nebulization Q6H   iron  polysaccharides  150 mg Oral Daily   lamoTRIgine   100 mg Oral Daily   levothyroxine  50 mcg Oral QAC breakfast   methylPREDNISolone  (SOLU-MEDROL ) injection  40 mg Intravenous Q12H   metoprolol  succinate  100 mg Oral Daily   sodium chloride   1 g Oral BID WC   Vitamin D (Ergocalciferol)  50,000 Units Oral Q7 days   Continuous Infusions:  azithromycin  Stopped (04/15/24 1417)   cefTRIAXone  (ROCEPHIN )  IV Stopped (04/15/24 1245)   lactated ringers      PRN Meds: acetaminophen  **OR** acetaminophen , hydrocortisone  cream, levalbuterol, ondansetron  **OR**  ondansetron  (ZOFRAN ) IV, polyethylene glycol, QUEtiapine , sorbitol  Time spent: 35 minutes  Author: ELVAN SOR. MD [facility] 04/15/2024 4:21 PM  To reach On-call, see care teams to locate the attending and reach out to them via [email or web address]. If 7PM-7AM, please contact night-coverage If you still have difficulty reaching the attending provider, please page the [facility] (Director on Call) for [facility] on amion for assistance.

## 2024-04-15 NOTE — Plan of Care (Signed)

## 2024-04-16 DIAGNOSIS — R6520 Severe sepsis without septic shock: Secondary | ICD-10-CM | POA: Diagnosis not present

## 2024-04-16 DIAGNOSIS — A419 Sepsis, unspecified organism: Secondary | ICD-10-CM | POA: Diagnosis not present

## 2024-04-16 LAB — CBC
HCT: 34.1 % — ABNORMAL LOW (ref 36.0–46.0)
Hemoglobin: 11 g/dL — ABNORMAL LOW (ref 12.0–15.0)
MCH: 33.6 pg (ref 26.0–34.0)
MCHC: 32.3 g/dL (ref 30.0–36.0)
MCV: 104.3 fL — ABNORMAL HIGH (ref 80.0–100.0)
Platelets: 383 K/uL (ref 150–400)
RBC: 3.27 MIL/uL — ABNORMAL LOW (ref 3.87–5.11)
RDW: 13.3 % (ref 11.5–15.5)
WBC: 13.6 K/uL — ABNORMAL HIGH (ref 4.0–10.5)
nRBC: 0 % (ref 0.0–0.2)

## 2024-04-16 LAB — BASIC METABOLIC PANEL WITH GFR
Anion gap: 8 (ref 5–15)
BUN: 44 mg/dL — ABNORMAL HIGH (ref 8–23)
CO2: 30 mmol/L (ref 22–32)
Calcium: 9.1 mg/dL (ref 8.9–10.3)
Chloride: 94 mmol/L — ABNORMAL LOW (ref 98–111)
Creatinine, Ser: 1 mg/dL (ref 0.44–1.00)
GFR, Estimated: >60 mL/min (ref >60)
Glucose, Bld: 122 mg/dL — ABNORMAL HIGH (ref 70–99)
Potassium: 5.4 mmol/L — ABNORMAL HIGH (ref 3.5–5.1)
Sodium: 132 mmol/L — ABNORMAL LOW (ref 135–145)

## 2024-04-16 LAB — MAGNESIUM: Magnesium: 2.1 mg/dL (ref 1.7–2.4)

## 2024-04-16 LAB — PHOSPHORUS: Phosphorus: 2.7 mg/dL (ref 2.5–4.6)

## 2024-04-16 MED ORDER — METHYLPREDNISOLONE SODIUM SUCC 40 MG IJ SOLR
40.0000 mg | Freq: Every day | INTRAMUSCULAR | Status: DC
  Administered 2024-04-17 – 2024-04-18 (×2): 40 mg via INTRAVENOUS
  Filled 2024-04-16 (×2): qty 1

## 2024-04-16 MED ORDER — AZITHROMYCIN 250 MG PO TABS
500.0000 mg | ORAL_TABLET | Freq: Every day | ORAL | Status: DC
  Administered 2024-04-16: 500 mg via ORAL
  Filled 2024-04-16: qty 2

## 2024-04-16 MED ORDER — DOXYCYCLINE HYCLATE 100 MG PO TABS
100.0000 mg | ORAL_TABLET | Freq: Two times a day (BID) | ORAL | Status: DC
  Administered 2024-04-16 – 2024-04-18 (×5): 100 mg via ORAL
  Filled 2024-04-16 (×5): qty 1

## 2024-04-16 MED ORDER — SODIUM ZIRCONIUM CYCLOSILICATE 10 G PO PACK
10.0000 g | PACK | Freq: Three times a day (TID) | ORAL | Status: AC
  Administered 2024-04-16 (×2): 10 g via ORAL
  Filled 2024-04-16 (×2): qty 1

## 2024-04-16 NOTE — Plan of Care (Signed)

## 2024-04-16 NOTE — TOC Initial Note (Signed)
 Transition of Care [facility]) - Initial/Assessment Note    Patient Details  Name: Regina Bernard MRN: [id number] Date of Birth: 09/21/1955  Transition of Care [facility]) CM/SW Contact:    Lauraine JAYSON Carpen, LCSW Phone Number: 04/16/2024, 1:12 PM  Clinical Narrative:  Readmission prevention screen complete. CSW met with patient. Husband at bedside. CSW introduced role and explained that discharge planning would be discussed. PCP is Alm Na, MD. Husband drives her to appointments. [facility] and uses [facility] for long-term medications. No issues obtaining medications. Patient lives home with her husband. She is active with [facility] for PT, OT, RN. Patient asked about an aide. Discussed initially using an OT assistant in place of that service but [facility] said they just hired a new Engineer, production. Patient inquired about personal care services. Gave her the phone number for the [employer] liaison in case she decides to pursue that. She has a RW, rollator, nebulizer, oxygen  ([employer]), and a 3-in-1 at home. No further concerns. CSW will continue to follow patient for support and facilitate return home when stable. Her husband will transport her home at discharge.  Expected Discharge Plan: Home w Home Health Services Barriers to Discharge: Continued Medical Work up   Patient Goals and CMS Choice            Expected Discharge Plan and Services     Post Acute Care Choice: Resumption of Svcs/PTA Provider Living arrangements for the past 2 months: Single Family Home                           HH Arranged: RN, PT, OT, Nurse's Aide HH Agency: [facility] Date [facility] Agency Contacted: 04/16/24   Representative spoke with at [facility] Agency: [place]  Prior Living Arrangements/Services Living arrangements for the past 2 months: Single Family Home Lives with:: Spouse Patient language and need for interpreter reviewed:: Yes Do you  feel safe going back to the place where you live?: Yes      Need for Family Participation in Patient Care: Yes (Comment) Care giver support system in place?: Yes (comment) Current home services: DME, Home OT, Home PT, Home RN Criminal Activity/Legal Involvement Pertinent to Current Situation/Hospitalization: No - Comment as needed  Activities of Daily Living      Permission Sought/Granted Permission sought to share information with : Facility Medical sales representative, Family Supports Permission granted to share information with : Yes, Verbal Permission Granted  Share Information with NAME: Ece Cumberland  Permission granted to share info w AGENCY: [facility]  Permission granted to share info w Relationship: Husband  Permission granted to share info w Contact Information: [phone]  Emotional Assessment Appearance:: Appears stated age Attitude/Demeanor/Rapport: Engaged, Gracious Affect (typically observed): Accepting, Appropriate, Calm, Pleasant Orientation: : Oriented to Self, Oriented to Place, Oriented to  Time, Oriented to Situation Alcohol / Substance Use: Not Applicable Psych Involvement: No (comment)  Admission diagnosis:  Bronchospasm [J98.01] Severe sepsis (HCC) [A41.9, R65.20] Pneumonia due to infectious organism, unspecified laterality, unspecified part of lung [J18.9] Sepsis, due to unspecified organism, unspecified whether acute organ dysfunction present [facility]) [A41.9] Patient Active Problem List   Diagnosis Date Noted   Severe sepsis (HCC) 04/13/2024   Chronic respiratory failure with hypoxia (HCC) 05/01/2023   Closed left hip fracture (HCC) 04/25/2023   Postpartum depression associated with seventh pregnancy 04/25/2023   Depression with anxiety 04/25/2023   Chest pain  04/25/2023   Hyperkalemia 04/25/2023   Spinal stenosis, lumbar region, with neurogenic claudication 12/24/2022   Foraminal stenosis of lumbar region 12/24/2022   Compression fracture of L1  lumbar vertebra (HCC) 12/24/2022   History of kyphoplasty 12/24/2022   Polysubstance abuse (HCC) (hx of) 12/24/2022   Chronic pain syndrome 12/24/2022   Protein-calorie malnutrition, moderate 07/04/2022   Necrotizing pneumonia (HCC) 07/03/2022   Aspiration pneumonia (HCC) 06/28/2022   Acute hypoxic respiratory failure (HCC) 06/28/2022   Septic shock (HCC) 06/28/2022   AKI (acute kidney injury) 06/28/2022   Hyponatremia 06/28/2022   Screening for colon cancer    Ulceration of intestine    PUD (peptic ulcer disease)    History of Helicobacter pylori infection    Vapes nicotine  containing substance 11/01/2021   H pylori ulcer    Upper GI bleed 10/28/2021   Generalized anxiety disorder 10/28/2021   Essential hypertension 10/28/2021   ABLA (acute blood loss anemia) 10/28/2021   Liver cirrhosis (HCC) 10/28/2021   COPD with acute exacerbation (HCC) 01/14/2018   Carcinoma of overlapping sites of right breast in female, estrogen receptor positive (HCC) 04/18/2016   Severe chronic obstructive pulmonary disease (HCC) 11/16/2013   PCP:  Glover Lenis, MD [facility]:   [phone] [address] [place] - [address] [place] [address] Phone: [phone] Fax: [phone]  [facility]) - [place], [place] - [address] [facility] [place] [address] [place] [address] Phone: [phone] Fax: [phone]     Social Drivers of Health (SDOH) Social History: SDOH Screenings   Food Insecurity: No Food Insecurity (04/15/2024)  Housing: Low Risk  (04/15/2024)  Transportation Needs: No Transportation Needs (04/15/2024)  Utilities: Not At Risk (04/15/2024)  Financial Resource Strain: Low Risk  (01/13/2024)   Received from [facility]  Social Connections: Moderately Integrated (04/15/2024)  Tobacco Use: High Risk (04/13/2024)   SDOH Interventions:     Readmission Risk Interventions    04/16/2024    1:09  PM  Readmission Risk Prevention Plan  Transportation Screening Complete  Medication Review (RN Care Manager) Complete  PCP or Specialist appointment within 3-5 days of discharge Complete  HRI or Home Care Consult Complete  SW Recovery Care/Counseling Consult Complete  Palliative Care Screening Not Applicable  Skilled Nursing Facility Not Applicable

## 2024-04-16 NOTE — Progress Notes (Signed)
 [facility] Progress Note  Patient: Regina Bernard    FMW:[id number]  DOA: 04/13/2024     Date of Service: the patient was seen and examined on 04/16/2024  Chief Complaint  Patient presents with   Shortness of Breath   Brief hospital course: Regina Bernard is a 68 y.o. female with medical history significant of advanced COPD on chronic prednisone  and 4-5 L/min O2 at baseline, history of breast cancer, hypertension who presented to the ED today by EMS for evaluation of progressively worsening shortness of breath.  Patient reports about 3 days difficulty breathing getting worse.  She denies fevers but reports chills, also reports productive cough, diminished appetite with poor p.o. intake, feeling dehydrated.  She notes her baseline dyspnea seem to get worse several weeks ago.  She denies abdominal pain nausea vomiting diarrhea, headache dizziness, unilateral weakness numbness or tingling, hematuria dysuria or other symptoms.  No known sick contacts.  EMS was reportedly called yesterday but patient refused transfer to the hospital at that time.  On arrival to the ED today, patient was on nonrebreather mask with reported O2 sat of only 72% and in respiratory distress.  Patient was emergently placed on BiPAP with improved O2 sats and work of breathing.   ED course: Temp 98.4 F, initial HR 140, RR 30, BP 133/84, SpO2 100% on BiPAP. Labs obtained including CMP and CBC were notable for sodium 129, chloride 87, nonfasting glucose 100, alk phos 137, albumin 3.4, WBC significantly elevated 28.4, hemoglobin 11.5.  BNP was 127.3.  Troponin normal 14.  Venous blood gas pH 7.42, pCO2 normal 59.     Chest x-ray showed multifocal pulmonary opacities in the right mid and lower lung zone suspicious for pneumonia with background emphysema and chronic scarring and bulla in the lateral left upper lobe.   Patient is being admitted to progressive care unit for further evaluation and management of acute on  chronic respiratory failure with hypoxia in the setting of sepsis due to community-acquired pneumonia.    Assessment and Plan:  # Severe sepsis due to multifocal pneumonia Severe sepsis POA as evidenced by tachycardia, tachypnea, leukocytosis, organ dysfunction evidenced by acute on chronic respiratory failure. S/p IV Levaquin  given in the ED.  # Acute on chronic respiratory failure with hypoxia Advanced COPD -on chronic prednisone , on 4-5 L/min baseline oxygen .  Presented in respiratory distress with O2 sat 72% on nonrebreather mask reported by EDP -- s/p IVF due to sepsis, given history of CHF -1 L bolus -- Patient has penicillin allergy but has tolerated cephalosporins -- IV antibiotics with Rocephin  and Zithromax  -- IV Solu-Medrol  60 mg twice daily -- Scheduled Mucinex  -- Follow blood cultures, 1/4 GPC most likely contamination -- Strep pneumo and Legionella antigen negative -- Continuous pulse ox -- BiPAP as needed -- Supplemental O2, maintain sats 88 to 94% -- Scheduled DuoNebs, as needed Xopenex -- Substitute Breztri for home Trelegy -- N.p.o. while on BiPAP, may take short breaks for ice chips --RVP panel negative CT chest: Increased fibrotic changes right lung base, no definitive acute infiltrates.  Chronic scarring left lung, cavitary areas increased but no findings to suggest superinfection.  Pulmonary emphysema.  Increased risk of cancer need to be monitored as an outpatient Pulmonary consulted, recommended bronchoscopy as an outpatient and DC plan in 1 to 2 days.   # Chronic HFpEF -appears euvolemic, compensated.  Echo in September EF 55%, grade 1 diastolic dysfunction # Essential hypertension -- Continue home Lasix   -- Continue home  metoprolol  -- Daily weights to monitor volume status   # Hypothyroidism -continue home Synthroid   # Neuropathy -continue home gabapentin    # Depression/anxiety -continue home Cymbalta  # Insomnia -continue home Seroquel    # Anemia  secondary to iron  deficiency. Tsat 9%, started oral iron  supplement with vitamin C.  Follow with PCP to repeat iron  profile after 3 to 6 months. Folic acid level 7.6, at lower end.  Started folic acid 1 mg p.o. daily. B12 level 431, within normal range  # Vitamin D deficiency: started vitamin D 50,000 units p.o. weekly, follow with PCP to repeat vitamin D level after 3 to 6 months.  # Isotonic hyponatremia,  Serum osmolality 281 Na 129--123>132 S/p sodium chloride  1 g p.o. twice daily, d/c;d on 10/10 Check sodium level daily  # Hyperkalemia, mild, Lokelma  given. Continue low potassium diet Check electrolytes daily   Body mass index is 19.31 kg/m.  Interventions:   Diet: Regular diet DVT Prophylaxis: Subcutaneous Lovenox    Advance goals of care discussion: Full code  Family Communication: family was not present at bedside, at the time of interview.  The pt provided permission to discuss medical plan with the family. Opportunity was given to ask question and all questions were answered satisfactorily.   Disposition:  Pt is from Home, admitted with Resp failure, PNA sepsis, still has resp failure on IV Abx, which precludes a safe discharge. Discharge to Home, when stable, may need few days to improve.  Subjective: No significant events overnight, patient still has a cough but it is dry, nothing is coming out.  Shortness of breath is improving.  Denied any chest pain or palpitations.    Physical Exam: General: Mild respiratory distress, short of breath  Appear in mild distress, affect appropriate Eyes: PERRLA ENT: Oral Mucosa Clear, moist  Neck: no JVD,  Cardiovascular: S1 and S2 Present, no Murmur,  Respiratory: Increased respiratory effort, mild crackles and minimal wheezes, significantly improved.   Abdomen: Bowel Sound present, Soft and no tenderness,  Skin: no rashes Extremities: no Pedal edema, no calf tenderness Neurologic: without any new focal findings Gait not  checked due to patient safety concerns  Vitals:   04/16/24 0405 04/16/24 0742 04/16/24 1252 04/16/24 1434  BP: (!) 155/81 (!) 144/78 (!) 153/76 138/80  Pulse: 62 69 83 84  Resp: 20 18 17    Temp: 98.4 F (36.9 C) (!) 97.3 F (36.3 C) 98.6 F (37 C) 98 F (36.7 C)  TempSrc:  Oral    SpO2: 90% 100% 100% 96%  Weight:      Height:        Intake/Output Summary (Last 24 hours) at 04/16/2024 1643 Last data filed at 04/16/2024 1451 Gross per 24 hour  Intake 660 ml  Output 1180 ml  Net -520 ml   Filed Weights   04/13/24 1238  Weight: 49.4 kg    Data Reviewed: I have personally reviewed and interpreted daily labs, tele strips, imagings as discussed above. I reviewed all nursing notes, pharmacy notes, vitals, pertinent old records I have discussed plan of care as described above with RN and patient/family.  CBC: Recent Labs  Lab 04/13/24 1248 04/14/24 0224 04/15/24 0420 04/16/24 0618  WBC 28.4* 18.4* 32.4* 13.6*  NEUTROABS 25.9*  --   --   --   HGB 11.5* 10.0* 9.8* 11.0*  HCT 33.9* 30.0* 29.8* 34.1*  MCV 100.3* 101.4* 102.1* 104.3*  PLT 368 318 382 383   Basic Metabolic Panel: Recent Labs  Lab 04/13/24  1248 04/14/24 0224 04/15/24 0420 04/16/24 0618  NA 129* 129* 123* 132*  K 4.4 4.3 5.1 5.4*  CL 87* 91* 88* 94*  CO2 30 27 27 30   GLUCOSE 100* 187* 115* 122*  BUN 21 21 37* 44*  CREATININE 0.82 1.16* 1.26* 1.00  CALCIUM 9.3 9.2 9.0 9.1  MG 1.8 1.7 2.0 2.1  PHOS  --  2.4* 3.3 2.7    Studies: No results found.  Scheduled Meds:  vitamin C  500 mg Oral Daily   budesonide -glycopyrrolate -formoterol  2 puff Inhalation BID   vitamin B-12  500 mcg Oral Daily   doxycycline  100 mg Oral Q12H   DULoxetine   30 mg Oral BID   enoxaparin  (LOVENOX ) injection  40 mg Subcutaneous Q24H   feeding supplement  237 mL Oral BID BM   folic acid  1 mg Oral Daily   furosemide   20 mg Oral Daily   gabapentin   300 mg Oral TID   guaiFENesin   600 mg Oral BID   ipratropium-albuterol    3 mL Nebulization Q6H   iron  polysaccharides  150 mg Oral Daily   lamoTRIgine   100 mg Oral Daily   levothyroxine  50 mcg Oral QAC breakfast   [START ON 04/17/2024] methylPREDNISolone  (SOLU-MEDROL ) injection  40 mg Intravenous Daily   metoprolol  succinate  100 mg Oral Daily   sodium zirconium cyclosilicate   10 g Oral TID   Vitamin D (Ergocalciferol)  50,000 Units Oral Q7 days   Continuous Infusions:   PRN Meds: acetaminophen  **OR** acetaminophen , hydrocortisone  cream, levalbuterol, ondansetron  **OR** ondansetron  (ZOFRAN ) IV, polyethylene glycol, QUEtiapine , sorbitol  Time spent: 35 minutes  Author: ELVAN SOR. MD [facility] 04/16/2024 4:43 PM  To reach On-call, see care teams to locate the attending and reach out to them via [email or web address]. If 7PM-7AM, please contact night-coverage If you still have difficulty reaching the attending provider, please page the [facility] (Director on Call) for [facility] on amion for assistance.

## 2024-04-16 NOTE — Progress Notes (Signed)
 PULMONOLOGY         Date: 04/16/2024,   MRN# [id number] Regina Bernard 15-Nov-1955     AdmissionWeight: 49.4 kg                 CurrentWeight: 49.4 kg  Referring provider: Dr Von   CHIEF COMPLAINT:   Acute exacerbation of COPD   HISTORY OF PRESENT ILLNESS   Is a 68 year old female with a history of asthma and COPD overlap syndrome, history of right sided breast cancer, endometriosis with hysterectomy, chronic hep C with Polly substance abuse history, essential hypertension, peripheral neuropathy, chronic use of aromatase inhibitor therapy and chronic hypoxemia utilizing 4 to 5 L/min of supplemental oxygen  at baseline.  She reports having few days of worsening respiratory symptoms with cough and in the field on arrival was noted to be hypoxemic at 72% with respiratory distress and accessory muscle use.  She was placed on BiPAP support with subsequent improvement however continues to show signs of distress..  Chest x-ray performed shows acute on chronic changes with history of breast cancer and cavitary lung disease as well as advanced COPD.  PCCM consultation placed for additional evaluation and management of patient with complex chronic lung disease with acute on chronic hypoxemic respiratory failure and respiratory distress requiring NIV support.  04/15/24- patient s/p CT chest with progressive cavitary LUL lesion and saccular bronchiectasis.  She has not had bronchoscopy in the past and we discussed doing this on outpatient basis and she is agreeable to this. We weaned steroids to 40mg  IV BID. She remains on zithromax  and rocephin . She is improving and we can plan for dc in 48hr.   04/16/24- patient clinically doing well, reduced steroids to 40 iv daily and changed abx to PO regimen. Kidney function is improved to normal with GFR >60  Plan to dc  in am.  PAST MEDICAL HISTORY   Past Medical History:  Diagnosis Date   Abnormal CT scan, chest    Anxiety    Asthma     Breast cancer (HCC)    Breast CA- Right    Breast cancer (HCC) 06/18/2011   right breast cancer   COPD (chronic obstructive pulmonary disease) (HCC)    Endometriosis    tx with vaginal hysterectomy   Fatty liver    Hepatitis C 1998   [facility] trial treatment in-patient study 2005. HCV 740, 05/20/11.   History of substance abuse (HCC)    Heroine, cocaine, marijuana. States all of them. Heavy use 1995 to 2000. Occasional use before and after. None since 2006.    Hot flashes related to aromatase inhibitor therapy    Hypertension    Lung mass    Peripheral neuropathy    in hands and feet     SURGICAL HISTORY   Past Surgical History:  Procedure Laterality Date   BREAST LUMPECTOMY Right 2013   COLONOSCOPY WITH PROPOFOL  N/A 03/20/2022   Procedure: COLONOSCOPY WITH PROPOFOL ;  Surgeon: Unk Corinn Skiff, MD;  Location: [facility] ENDOSCOPY;  Service: Gastroenterology;  Laterality: N/A;   ESOPHAGOGASTRODUODENOSCOPY (EGD) WITH PROPOFOL  N/A 10/29/2021   Procedure: ESOPHAGOGASTRODUODENOSCOPY (EGD) WITH PROPOFOL ;  Surgeon: Unk Corinn Skiff, MD;  Location: [facility] ENDOSCOPY;  Service: Gastroenterology;  Laterality: N/A;   ESOPHAGOGASTRODUODENOSCOPY (EGD) WITH PROPOFOL  N/A 02/14/2022   Procedure: ESOPHAGOGASTRODUODENOSCOPY (EGD) WITH PROPOFOL ;  Surgeon: Unk Corinn Skiff, MD;  Location: [facility] ENDOSCOPY;  Service: Gastroenterology;  Laterality: N/A;  DRIVER 5 - 10 MINUTES AWAY   INTRAMEDULLARY (IM) NAIL INTERTROCHANTERIC Left 04/26/2023  Procedure: INTRAMEDULLARY (IM) NAIL INTERTROCHANTERIC;  Surgeon: Cleotilde Barrio, MD;  Location: [facility] ORS;  Service: Orthopedics;  Laterality: Left;   TUBAL LIGATION  1996   Upper right leg benign tumor removed     VAGINAL HYSTERECTOMY  2001     FAMILY HISTORY   Family History  Problem Relation Age of Onset   Diabetes Maternal Grandmother    Lung cancer Father    Lung cancer Mother    Hypertension Mother    Heart disease Mother    Skin cancer Mother    Breast  cancer Neg Hx      SOCIAL HISTORY   Social History   Tobacco Use   Smoking status: Every Day    Types: E-cigarettes   Smokeless tobacco: Never   Tobacco comments:    last cigeratte use 6 days ago (11/30/15)  Vaping Use   Vaping status: Every Day  Substance Use Topics   Alcohol use: Yes    Comment: occ   Drug use: No     MEDICATIONS    Home Medication:     Current Medication:  Current Facility-Administered Medications:    acetaminophen  (TYLENOL ) tablet 650 mg, 650 mg, Oral, Q6H PRN, 650 mg at 04/15/24 1111 **OR** acetaminophen  (TYLENOL ) suppository 650 mg, 650 mg, Rectal, Q6H PRN, Fausto Sor A, DO   ascorbic acid (VITAMIN C) tablet 500 mg, 500 mg, Oral, Daily, Von Bellis, MD, 500 mg at 04/16/24 9075   azithromycin  (ZITHROMAX ) tablet 500 mg, 500 mg, Oral, Daily, Zeigler, Dustin G, RPH, 500 mg at 04/16/24 9076   budesonide -glycopyrrolate -formoterol (BREZTRI) 160-9-4.8 MCG/ACT inhaler 2 puff, 2 puff, Inhalation, BID, Fausto Sor A, DO, 2 puff at 04/16/24 0930   cefTRIAXone  (ROCEPHIN ) 2 g in sodium chloride  0.9 % 100 mL IVPB, 2 g, Intravenous, Q24H, Hunt, Madison H, RPH, Stopped at 04/15/24 1245   cyanocobalamin  (VITAMIN B12) tablet 500 mcg, 500 mcg, Oral, Daily, Kumar, Dileep, MD, 500 mcg at 04/16/24 9076   DULoxetine  (CYMBALTA ) DR capsule 30 mg, 30 mg, Oral, BID, Fausto Sor A, DO, 30 mg at 04/16/24 0947   enoxaparin  (LOVENOX ) injection 40 mg, 40 mg, Subcutaneous, Q24H, Fausto Sor A, DO, 40 mg at 04/15/24 2129   feeding supplement (ENSURE ENLIVE / ENSURE PLUS) liquid 237 mL, 237 mL, Oral, BID BM, Griffith, Kelly A, DO, 237 mL at 04/15/24 1053   folic acid (FOLVITE) tablet 1 mg, 1 mg, Oral, Daily, Von Bellis, MD, 1 mg at 04/16/24 9074   furosemide  (LASIX ) tablet 20 mg, 20 mg, Oral, Daily, Von Bellis, MD, 20 mg at 04/16/24 9075   gabapentin  (NEURONTIN ) capsule 300 mg, 300 mg, Oral, TID, Fausto Sor A, DO, 300 mg at 04/16/24 9075   guaiFENesin   (MUCINEX ) 12 hr tablet 600 mg, 600 mg, Oral, BID, Fausto Sor A, DO, 600 mg at 04/16/24 9075   hydrocortisone  cream 1 %, , Topical, TID PRN, Von Bellis, MD   ipratropium-albuterol  (DUONEB) 0.5-2.5 (3) MG/3ML nebulizer solution 3 mL, 3 mL, Nebulization, Q6H, Griffith, Kelly A, DO, 3 mL at 04/16/24 9257   iron  polysaccharides (NIFEREX) capsule 150 mg, 150 mg, Oral, Daily, Von Bellis, MD, 150 mg at 04/16/24 0948   lamoTRIgine  (LAMICTAL ) tablet 100 mg, 100 mg, Oral, Daily, Fausto Sor A, DO, 100 mg at 04/16/24 9075   levalbuterol (XOPENEX) nebulizer solution 0.63 mg, 0.63 mg, Nebulization, Q3H PRN, Fausto Sor A, DO   levothyroxine (SYNTHROID) tablet 50 mcg, 50 mcg, Oral, QAC breakfast, Fausto Sor A, DO, 50 mcg at 04/16/24 [phone]  methylPREDNISolone  sodium succinate  (SOLU-MEDROL ) 40 mg/mL injection 40 mg, 40 mg, Intravenous, Q12H, Hoa Deriso, MD, 40 mg at 04/16/24 9074   metoprolol  succinate (TOPROL -XL) 24 hr tablet 100 mg, 100 mg, Oral, Daily, Fausto Sor A, DO, 100 mg at 04/16/24 9076   ondansetron  (ZOFRAN ) tablet 4 mg, 4 mg, Oral, Q6H PRN **OR** ondansetron  (ZOFRAN ) injection 4 mg, 4 mg, Intravenous, Q6H PRN, Fausto Sor A, DO   polyethylene glycol (MIRALAX  / GLYCOLAX ) packet 17 g, 17 g, Oral, Daily PRN, Fausto Sor A, DO   QUEtiapine  (SEROQUEL ) tablet 25 mg, 25 mg, Oral, QHS PRN, Fausto Sor A, DO, 25 mg at 04/15/24 2131   sodium zirconium cyclosilicate  (LOKELMA ) packet 10 g, 10 g, Oral, TID, Von Bellis, MD, 10 g at 04/16/24 0948   sorbitol 70 % solution 30 mL, 30 mL, Oral, Daily PRN, Fausto Sor A, DO   Vitamin D (Ergocalciferol) (DRISDOL) 1.25 MG (50000 UNIT) capsule 50,000 Units, 50,000 Units, Oral, Q7 days, Von Bellis, MD, 50,000 Units at 04/15/24 1209    ALLERGIES   Penicillins     REVIEW OF SYSTEMS    Review of Systems:  Gen:  Denies  fever, sweats, chills weigh loss  HEENT: Denies blurred vision, double vision, ear pain, eye  pain, hearing loss, nose bleeds, sore throat Cardiac:  No dizziness, chest pain or heaviness, chest tightness,edema Resp:   reports dyspnea chronically  Gi: Denies swallowing difficulty, stomach pain, nausea or vomiting, diarrhea, constipation, bowel incontinence Gu:  Denies bladder incontinence, burning urine Ext:   Denies Joint pain, stiffness or swelling Skin: Denies  skin rash, easy bruising or bleeding or hives Endoc:  Denies polyuria, polydipsia , polyphagia or weight change Psych:   Denies depression, insomnia or hallucinations   Other:  All other systems negative   VS: BP (!) 153/76 (BP Location: Right Arm)   Pulse 83   Temp 98.6 F (37 C)   Resp 17   Ht 5' 3 (1.6 m)   Wt 49.4 kg   SpO2 100%   BMI 19.31 kg/m      PHYSICAL EXAM    GENERAL:NAD, no fevers, chills, no weakness no fatigue HEAD: Normocephalic, atraumatic.  EYES: Pupils equal, round, reactive to light. Extraocular muscles intact. No scleral icterus.  MOUTH: Moist mucosal membrane. Dentition intact. No abscess noted.  EAR, NOSE, THROAT: Clear without exudates. No external lesions.  NECK: Supple. No thyromegaly. No nodules. No JVD.  PULMONARY: decreased breath sounds with mild rhonchi worse at bases bilaterally.  CARDIOVASCULAR: S1 and S2. Regular rate and rhythm. No murmurs, rubs, or gallops. No edema. Pedal pulses 2+ bilaterally.  GASTROINTESTINAL: Soft, nontender, nondistended. No masses. Positive bowel sounds. No hepatosplenomegaly.  MUSCULOSKELETAL: No swelling, clubbing, or edema. Range of motion full in all extremities.  NEUROLOGIC: Cranial nerves II through XII are intact. No gross focal neurological deficits. Sensation intact. Reflexes intact.  SKIN: No ulceration, lesions, rashes, or cyanosis. Skin warm and dry. Turgor intact.  PSYCHIATRIC: Mood, affect within normal limits. The patient is awake, alert and oriented x 3. Insight, judgment intact.       IMAGING   CLINICAL DATA:  Severe  shortness of breath.   EXAM: PORTABLE CHEST 1 VIEW   COMPARISON:  Radiograph and CT 04/25/2023   FINDINGS: Multifocal pulmonary opacities in the right mid lower lung zone suspicious for pneumonia. Background emphysema with chronic scarring and bulla in the lateral left upper lobe. Mediastinal contours are distorted due due to scoliosis. The heart is normal in size  with aortic atherosclerosis and tortuosity. No significant pleural effusion. Thoracic scoliosis.   IMPRESSION: 1. Multifocal pulmonary opacities in the right mid lower lung zone suspicious for pneumonia. 2. Background emphysema with chronic scarring and bulla in the lateral left upper lobe.     Electronically Signed   By: Andrea Gasman M.D.   On: 04/13/2024 14:10  ASSESSMENT/PLAN     Community acquired pneumonia    - RVP- negative    - procalcitonin trend    - CRP - 10    - empiric therapy with zithromax  and rocephin  IV    - MRSA prcr     - AFB resp cultures     - fungitell    - legionella     - strep pneumoniae ur ag    - histoplasma ur ag    - gram stain and culture sputum     -there is mild AKI precluding CT chest with IV contrast , will give LR and consider in am to perform contrasted CT chest     Acute exacerbation of COPD - moderate  -continue nebulizer therapy with xopenex -agree with steroids - currently at 60mg  BID solumedrol -Incentive spirometry  -PT/OT    AKI - KDIGO 2 -suspect this is due to ischemia with acute on chronic hypoxemia -will rehydrate with LR and recheck BMP in am -hopefully we can get CT chest with contrast after renal indices are within reference range.   Thank you for allowing me to participate in the care of this patient.   Patient/Family are satisfied with care plan and all questions have been answered.    Provider disclosure: Patient with at least one acute or chronic illness or injury that poses a threat to life or bodily function and is being managed  actively during this encounter.  All of the below services have been performed independently by signing provider:  review of prior documentation from internal and or external health records.  Review of previous and current lab results.  Interview and comprehensive assessment during patient visit today. Review of current and previous chest radiographs/CT scans. Discussion of management and test interpretation with health care team and patient/family.   This document was prepared using Dragon voice recognition software and may include unintentional dictation errors.     Demontez Novack, M.D.  Division of Pulmonary & Critical Care Medicine

## 2024-04-16 NOTE — Progress Notes (Signed)
 Physical Therapy Treatment Patient Details Name: Regina Bernard MRN: [id number] DOB: 07/10/1955 Today's Date: 04/16/2024   History of Present Illness Pt is a 68 y.o. female who presented to the ED today by EMS for evaluation of progressively worsening shortness of breath, chills, productive cough, diminished appetite with poor p.o. intake, feeling dehydrated. Current MD assessment: Severe sepsis due to multifocal pneumonia, Acute on chronic respiratory failure with hypoxia, advanced COPD. PMH of advanced COPD on chronic prednisone  and 4-5 L/min O2 at baseline, history of breast cancer, hypertension    PT Comments  Pt demonstrates improvement with activity tolerance and can amb 64ft using RW. Pt did require prolonged rest break d/t fatigue. Pt continues to demonstrate dec balance and demonstrated 1 instance of LOB when entering the threshold of the bathroom 2/2 narrow BOS with amb, requiring minA for correction. O2 at 2L while amb however pt's SpO2 dropped to 85%requiring inc in supplemental O2 to 3L. Verbal cues for PLB, for improved SpO2 to 92%. Pt left in bed on 2.5L of O2 with SpO2 return to WNL. Pt would benefit from continued skilled PT to improve strength,balance and endurance.    If plan is discharge home, recommend the following: A lot of help with walking and/or transfers;A lot of help with bathing/dressing/bathroom   Can travel by private vehicle        Equipment Recommendations  None recommended by PT    Recommendations for Other Services       Precautions / Restrictions Precautions Precautions: Fall Recall of Precautions/Restrictions: Intact Restrictions Weight Bearing Restrictions Per Provider Order: No     Mobility  Bed Mobility Overal bed mobility: Modified Independent             General bed mobility comments: able to move from supine<>sit without physical assistance. increased time and effort    Transfers Overall transfer level: Needs  assistance Equipment used: Rolling walker (2 wheels) Transfers: Sit to/from Stand, Bed to chair/wheelchair/BSC Sit to Stand: Contact guard assist   Step pivot transfers: Contact guard assist, Min assist       General transfer comment: Cues for hand placement. Pt demonstrates posterior lean on BSC to assist herself up and is able to correct posture to stand upright.    Ambulation/Gait Ambulation/Gait assistance: Contact guard assist, Min assist Gait Distance (Feet): 40 Feet Assistive device: Rolling walker (2 wheels) Gait Pattern/deviations: Narrow base of support, Trunk flexed Gait velocity: decreased     General Gait Details: Verbal cues for RW management. Pt continues to be SOB with activity however is very motivated to make it to the bathroom and back to her bed. SpO2 monitored throuhgout session.   Stairs             Wheelchair Mobility     Tilt Bed    Modified Rankin (Stroke Patients Only)       Balance Overall balance assessment: Needs assistance Sitting-balance support: Feet supported Sitting balance-Leahy Scale: Good     Standing balance support: Bilateral upper extremity supported, During functional activity, Reliant on assistive device for balance Standing balance-Leahy Scale: Poor Standing balance comment: Able to stand from lowest bed height and BSC with min A. Pt demonstrates R knee in valgus position to stand. Posterior lean on BSC when standing, however is able to self correct.                            Communication Communication Communication: No apparent difficulties  Cognition Arousal: Alert Behavior During Therapy: WFL for tasks assessed/performed   PT - Cognitive impairments: No apparent impairments                       PT - Cognition Comments: pleasant and agreeable to PT Following commands: Intact      Cueing Cueing Techniques: Verbal cues  Exercises Other Exercises Other Exercises: Prolonged rest break  d/t fatigue    General Comments General comments (skin integrity, edema, etc.): Fatigues quickly. SpO2 down to 85% on 2L. Improve to 92% with increase to 4L of O2.  Return to 2.5L at rest with SpO2 WNL      Pertinent Vitals/Pain Pain Assessment Pain Assessment: No/denies pain    Home Living                          Prior Function            PT Goals (current goals can now be found in the care plan section) Acute Rehab PT Goals Patient Stated Goal: get stronger, stop falling PT Goal Formulation: With patient Time For Goal Achievement: 04/29/24 Potential to Achieve Goals: Fair Progress towards PT goals: Progressing toward goals    Frequency    Min 2X/week      PT Plan      Co-evaluation              AM-PAC PT 6 Clicks Mobility   Outcome Measure  Help needed turning from your back to your side while in a flat bed without using bedrails?: None Help needed moving from lying on your back to sitting on the side of a flat bed without using bedrails?: None Help needed moving to and from a bed to a chair (including a wheelchair)?: A Little Help needed standing up from a chair using your arms (e.g., wheelchair or bedside chair)?: A Little Help needed to walk in hospital room?: A Lot Help needed climbing 3-5 steps with a railing? : A Lot 6 Click Score: 18    End of Session Equipment Utilized During Treatment: Gait belt;Oxygen  Activity Tolerance: Patient tolerated treatment well Patient left: in bed;with call bell/phone within reach;with bed alarm set Nurse Communication: Mobility status PT Visit Diagnosis: Other abnormalities of gait and mobility (R26.89);Unsteadiness on feet (R26.81);Repeated falls (R29.6);Muscle weakness (generalized) (M62.81);Difficulty in walking, not elsewhere classified (R26.2)     Time: 8943-8884 PT Time Calculation (min) (ACUTE ONLY): 19 min  Charges:                            Regina Bernard, SPT    Dewey Neukam 04/16/2024, 11:52 AM

## 2024-04-16 NOTE — Progress Notes (Signed)
 PHARMACIST - PHYSICIAN COMMUNICATION DR:   TRH CONCERNING: Antibiotic IV to Oral Route Change Policy  RECOMMENDATION: This patient is receiving azithromycin  by the intravenous route.  Based on criteria approved by the Pharmacy and Therapeutics Committee, the antibiotic(s) is/are being converted to the equivalent oral dose form(s).   DESCRIPTION: These criteria include: Patient being treated for a respiratory tract infection, urinary tract infection, cellulitis or clostridium difficile associated diarrhea if on metronidazole  The patient is not neutropenic and does not exhibit a GI malabsorption state The patient is eating (either orally or via tube) and/or has been taking other orally administered medications for a least 24 hours The patient is improving clinically and has a Tmax < 100.5  If you have questions about this conversion, please contact the Pharmacy Department  []   [phone] )  [name] [x]   [phone] )  [facility] []   [phone] )  [facility] []   [phone] )  [facility] []   [phone] )  [facility]  [facility]  Celestine Slovak, PharmD, BCPS, [place] Work Cell: [phone] 04/16/2024 8:37 AM

## 2024-04-16 NOTE — Progress Notes (Signed)
 Occupational Therapy Treatment Patient Details Name: Regina Bernard MRN: [id number] DOB: [birth year] Today's Date: 04/16/2024   History of present illness Pt is a 68 y.o. female who presented to the ED today by EMS for evaluation of progressively worsening shortness of breath, chills, productive cough, diminished appetite with poor p.o. intake, feeling dehydrated. Current MD assessment: Severe sepsis due to multifocal pneumonia, Acute on chronic respiratory failure with hypoxia, advanced COPD. PMH of advanced COPD on chronic prednisone  and 4-5 L/min O2 at baseline, history of breast cancer, hypertension   OT comments  Pt is supine in bed on arrival. Pleasant and agreeable to OT session. She denies pain. Pt performed routine ADL session seated EOB/standing at sink counter as listed below. She required set up to [facility] for performance of all tasks with wider BOS noted in standing to maintain balance. Cues for PLB required and therapeutic rest breaks d/t cardiopulmonary status. Pt returned to bed with all needs in place and will cont to require skilled acute OT services to maximize her safety and IND to return to PLOF.       If plan is discharge home, recommend the following:  A little help with walking and/or transfers;A little help with bathing/dressing/bathroom;Assistance with cooking/housework;Help with stairs or ramp for entrance   Equipment Recommendations  BSC/3in1    Recommendations for Other Services      Precautions / Restrictions Precautions Precautions: Fall Recall of Precautions/Restrictions: Intact Restrictions Weight Bearing Restrictions Per Provider Order: No       Mobility Bed Mobility Overal bed mobility: Modified Independent                  Transfers Overall transfer level: Needs assistance Equipment used: Rolling walker (2 wheels) Transfers: Sit to/from Stand, Bed to chair/wheelchair/BSC Sit to Stand: Contact guard assist           General transfer  comment: CGA/SBA with cues for pacing, hand placement and wider BOS noted at times to maintain balance; forward flexed posture at times     Balance Overall balance assessment: Needs assistance Sitting-balance support: Feet supported Sitting balance-Leahy Scale: Good     Standing balance support: Bilateral upper extremity supported, During functional activity, Reliant on assistive device for balance Standing balance-Leahy Scale: Fair                             ADL either performed or assessed with clinical judgement   ADL Overall ADL's : Needs assistance/impaired     Grooming: Wash/dry face;Sitting;Oral care;Standing;Applying deodorant;Brushing hair;Set up;Supervision/safety Grooming Details (indicate cue type and reason): seated EOB vs standing at sink counter with set up to SBA Upper Body Bathing: Sitting;Set up Upper Body Bathing Details (indicate cue type and reason): seated at EOB Lower Body Bathing: Supervison/ safety;Set up;Sit to/from stand;Sitting/lateral leans Lower Body Bathing Details (indicate cue type and reason): seated EOB/standing EOB Upper Body Dressing : Set up;Sitting Upper Body Dressing Details (indicate cue type and reason): to doff/donn gown Lower Body Dressing: Supervision/safety;Sit to/from stand;Sitting/lateral leans Lower Body Dressing Details (indicate cue type and reason): doff/donn mesh underwear Toilet Transfer: BSC/3in1;Ambulation;Contact guard assist;Supervision/safety Statistician Details (indicate cue type and reason): a few steps from bed to [facility] Toileting- Clothing Manipulation and Hygiene: Supervision/safety;Sitting/lateral lean Toileting - Clothing Manipulation Details (indicate cue type and reason): cont unrination on [facility]            Extremity/Trunk Assessment  Vision       Perception     Praxis     Communication Communication Communication: No apparent difficulties   Cognition Arousal:  Alert Behavior During Therapy: WFL for tasks assessed/performed                                 Following commands: Intact        Cueing   Cueing Techniques: Verbal cues  Exercises Other Exercises Other Exercises: continued education provided on PLB techniques to prevent overexertion.    Shoulder Instructions       General Comments Fatigues quickly. SpO2 down to 85% on 2L. Improve to 92% with increase to 4L of O2.  Return to 2.5L at rest with SpO2 WNL    Pertinent Vitals/ Pain       Pain Assessment Pain Assessment: No/denies pain  Home Living                                          Prior Functioning/Environment              Frequency  Min 2X/week        Progress Toward Goals  OT Goals(current goals can now be found in the care plan section)  Progress towards OT goals: Progressing toward goals  Acute Rehab OT Goals Patient Stated Goal: improve function/strength OT Goal Formulation: With patient Time For Goal Achievement: 04/29/24 Potential to Achieve Goals: Good  Plan      Co-evaluation                 AM-PAC OT 6 Clicks Daily Activity     Outcome Measure   Help from another person eating meals?: None Help from another person taking care of personal grooming?: A Little Help from another person toileting, which includes using toliet, bedpan, or urinal?: A Little Help from another person bathing (including washing, rinsing, drying)?: A Little Help from another person to put on and taking off regular upper body clothing?: A Little Help from another person to put on and taking off regular lower body clothing?: A Little 6 Click Score: 19    End of Session Equipment Utilized During Treatment: Rolling walker (2 wheels);Oxygen   OT Visit Diagnosis: Other abnormalities of gait and mobility (R26.89);Muscle weakness (generalized) (M62.81)   Activity Tolerance Patient tolerated treatment well   Patient Left with call  bell/phone within reach;in bed;with bed alarm set   Nurse Communication Mobility status        Time: 1202-1232 OT Time Calculation (min): 30 min  Charges: OT General Charges $OT Visit: 1 Visit OT Treatments $Self Care/Home Management : 23-37 mins  Arriana Lohmann, OTR/L  04/16/24, 2:04 PM   Ludy Messamore E Zoi Devine 04/16/2024, 2:03 PM

## 2024-04-17 ENCOUNTER — Inpatient Hospital Stay: Admit: 2024-04-17 | Discharge: 2024-04-17 | Disposition: A | Attending: Student | Admitting: Student

## 2024-04-17 DIAGNOSIS — A419 Sepsis, unspecified organism: Secondary | ICD-10-CM

## 2024-04-17 DIAGNOSIS — R6520 Severe sepsis without septic shock: Secondary | ICD-10-CM

## 2024-04-17 LAB — PHOSPHORUS: Phosphorus: 2.3 mg/dL — ABNORMAL LOW (ref 2.5–4.6)

## 2024-04-17 LAB — ECHOCARDIOGRAM COMPLETE
AR max vel: 2.63 cm2
AV Peak grad: 6.2 mmHg
Ao pk vel: 1.24 m/s
Area-P 1/2: 4.57 cm2
Height: 63 in
S' Lateral: 2.2 cm
Weight: 1744 [oz_av]

## 2024-04-17 LAB — CBC
HCT: 29.4 % — ABNORMAL LOW (ref 36.0–46.0)
Hemoglobin: 9.3 g/dL — ABNORMAL LOW (ref 12.0–15.0)
MCH: 33.3 pg (ref 26.0–34.0)
MCHC: 31.6 g/dL (ref 30.0–36.0)
MCV: 105.4 fL — ABNORMAL HIGH (ref 80.0–100.0)
Platelets: 345 K/uL (ref 150–400)
RBC: 2.79 MIL/uL — ABNORMAL LOW (ref 3.87–5.11)
RDW: 13.5 % (ref 11.5–15.5)
WBC: 12.1 K/uL — ABNORMAL HIGH (ref 4.0–10.5)
nRBC: 0 % (ref 0.0–0.2)

## 2024-04-17 LAB — BASIC METABOLIC PANEL WITH GFR
Anion gap: 7 (ref 5–15)
BUN: 36 mg/dL — ABNORMAL HIGH (ref 8–23)
CO2: 32 mmol/L (ref 22–32)
Calcium: 8.5 mg/dL — ABNORMAL LOW (ref 8.9–10.3)
Chloride: 93 mmol/L — ABNORMAL LOW (ref 98–111)
Creatinine, Ser: 1.02 mg/dL — ABNORMAL HIGH (ref 0.44–1.00)
GFR, Estimated: >60 mL/min (ref >60)
Glucose, Bld: 73 mg/dL (ref 70–99)
Potassium: 4.3 mmol/L (ref 3.5–5.1)
Sodium: 132 mmol/L — ABNORMAL LOW (ref 135–145)

## 2024-04-17 LAB — CULTURE, BLOOD (ROUTINE X 2): Special Requests: ADEQUATE

## 2024-04-17 LAB — MAGNESIUM: Magnesium: 1.6 mg/dL — ABNORMAL LOW (ref 1.7–2.4)

## 2024-04-17 MED ORDER — SODIUM PHOSPHATES 45 MMOLE/15ML IV SOLN
30.0000 mmol | Freq: Once | INTRAVENOUS | Status: AC
  Administered 2024-04-17: 30 mmol via INTRAVENOUS
  Filled 2024-04-17: qty 10

## 2024-04-17 MED ORDER — IPRATROPIUM-ALBUTEROL 0.5-2.5 (3) MG/3ML IN SOLN
3.0000 mL | Freq: Two times a day (BID) | RESPIRATORY_TRACT | Status: DC
  Administered 2024-04-17 – 2024-04-18 (×2): 3 mL via RESPIRATORY_TRACT
  Filled 2024-04-17 (×2): qty 3

## 2024-04-17 MED ORDER — MAGNESIUM SULFATE 2 GM/50ML IV SOLN
2.0000 g | Freq: Once | INTRAVENOUS | Status: AC
  Administered 2024-04-17: 2 g via INTRAVENOUS
  Filled 2024-04-17: qty 50

## 2024-04-17 MED ORDER — AMLODIPINE BESYLATE 5 MG PO TABS
5.0000 mg | ORAL_TABLET | Freq: Every evening | ORAL | Status: DC
  Administered 2024-04-17: 5 mg via ORAL
  Filled 2024-04-17: qty 1

## 2024-04-17 NOTE — Plan of Care (Signed)
  Problem: Clinical Measurements: Goal: Diagnostic test results will improve 04/17/2024 0701 by Gwenn Melnick, RN Outcome: Progressing 04/17/2024 0701 by Gwenn Melnick, RN Outcome: Progressing Goal: Signs and symptoms of infection will decrease Outcome: Progressing   Problem: Respiratory: Goal: Ability to maintain adequate ventilation will improve 04/17/2024 0701 by Gwenn Melnick, RN Outcome: Progressing 04/17/2024 0701 by Gwenn Melnick, RN Outcome: Progressing   Problem: Health Behavior/Discharge Planning: Goal: Ability to manage health-related needs will improve Outcome: Progressing   Problem: Clinical Measurements: Goal: Ability to maintain clinical measurements within normal limits will improve 04/17/2024 0701 by Gwenn Melnick, RN Outcome: Progressing 04/17/2024 0701 by Gwenn Melnick, RN Outcome: Progressing   Problem: Coping: Goal: Level of anxiety will decrease Outcome: Progressing

## 2024-04-17 NOTE — Plan of Care (Signed)
  Problem: Clinical Measurements: Goal: Diagnostic test results will improve Outcome: Progressing Goal: Signs and symptoms of infection will decrease Outcome: Progressing   Problem: Respiratory: Goal: Ability to maintain adequate ventilation will improve Outcome: Progressing   Problem: Clinical Measurements: Goal: Ability to maintain clinical measurements within normal limits will improve Outcome: Progressing

## 2024-04-17 NOTE — Progress Notes (Signed)
 PULMONOLOGY         Date: 04/17/2024,   MRN# [id number] Regina Bernard 04-Oct-1955     AdmissionWeight: 49.4 kg                 CurrentWeight: 49.4 kg  Referring provider: Dr Von   CHIEF COMPLAINT:   Acute exacerbation of COPD   HISTORY OF PRESENT ILLNESS   Is a 68 year old female with a history of asthma and COPD overlap syndrome, history of right sided breast cancer, endometriosis with hysterectomy, chronic hep C with Polly substance abuse history, essential hypertension, peripheral neuropathy, chronic use of aromatase inhibitor therapy and chronic hypoxemia utilizing 4 to 5 L/min of supplemental oxygen  at baseline.  She reports having few days of worsening respiratory symptoms with cough and in the field on arrival was noted to be hypoxemic at 72% with respiratory distress and accessory muscle use.  She was placed on BiPAP support with subsequent improvement however continues to show signs of distress..  Chest x-ray performed shows acute on chronic changes with history of breast cancer and cavitary lung disease as well as advanced COPD.  PCCM consultation placed for additional evaluation and management of patient with complex chronic lung disease with acute on chronic hypoxemic respiratory failure and respiratory distress requiring NIV support.  04/15/24- patient s/p CT chest with progressive cavitary LUL lesion and saccular bronchiectasis.  She has not had bronchoscopy in the past and we discussed doing this on outpatient basis and she is agreeable to this. We weaned steroids to 40mg  IV BID. She remains on zithromax  and rocephin . She is improving and we can plan for dc in 48hr.   04/16/24- patient clinically doing well, reduced steroids to 40 iv daily and changed abx to PO regimen. Kidney function is improved to normal with GFR >60  Plan to dc  in am.  04/17/24-patient with abnormal blood cultures with staph species, not sure the significance yet.  Will ask ID for  clarification if endocarditis workup may be needed.  Dr Luiz was able to do chart review with recommendation for TTE.   PAST MEDICAL HISTORY   Past Medical History:  Diagnosis Date   Abnormal CT scan, chest    Anxiety    Asthma    Breast cancer (HCC)    Breast CA- Right    Breast cancer (HCC) 06/18/2011   right breast cancer   COPD (chronic obstructive pulmonary disease) (HCC)    Endometriosis    tx with vaginal hysterectomy   Fatty liver    Hepatitis C 1998   [facility] trial treatment in-patient study 2005. HCV 740, 05/20/11.   History of substance abuse (HCC)    Heroine, cocaine, marijuana. States all of them. Heavy use 1995 to 2000. Occasional use before and after. None since 2006.    Hot flashes related to aromatase inhibitor therapy    Hypertension    Lung mass    Peripheral neuropathy    in hands and feet     SURGICAL HISTORY   Past Surgical History:  Procedure Laterality Date   BREAST LUMPECTOMY Right 2013   COLONOSCOPY WITH PROPOFOL  N/A 03/20/2022   Procedure: COLONOSCOPY WITH PROPOFOL ;  Surgeon: Unk Corinn Skiff, MD;  Location: [facility] ENDOSCOPY;  Service: Gastroenterology;  Laterality: N/A;   ESOPHAGOGASTRODUODENOSCOPY (EGD) WITH PROPOFOL  N/A 10/29/2021   Procedure: ESOPHAGOGASTRODUODENOSCOPY (EGD) WITH PROPOFOL ;  Surgeon: Unk Corinn Skiff, MD;  Location: [facility] ENDOSCOPY;  Service: Gastroenterology;  Laterality: N/A;   ESOPHAGOGASTRODUODENOSCOPY (EGD) WITH PROPOFOL   N/A 02/14/2022   Procedure: ESOPHAGOGASTRODUODENOSCOPY (EGD) WITH PROPOFOL ;  Surgeon: Unk Corinn Skiff, MD;  Location: [facility] ENDOSCOPY;  Service: Gastroenterology;  Laterality: N/A;  DRIVER 5 - 10 MINUTES AWAY   INTRAMEDULLARY (IM) NAIL INTERTROCHANTERIC Left 04/26/2023   Procedure: INTRAMEDULLARY (IM) NAIL INTERTROCHANTERIC;  Surgeon: Cleotilde Barrio, MD;  Location: [facility] ORS;  Service: Orthopedics;  Laterality: Left;   TUBAL LIGATION  1996   Upper right leg benign tumor removed     VAGINAL HYSTERECTOMY   2001     FAMILY HISTORY   Family History  Problem Relation Age of Onset   Diabetes Maternal Grandmother    Lung cancer Father    Lung cancer Mother    Hypertension Mother    Heart disease Mother    Skin cancer Mother    Breast cancer Neg Hx      SOCIAL HISTORY   Social History   Tobacco Use   Smoking status: Every Day    Types: E-cigarettes   Smokeless tobacco: Never   Tobacco comments:    last cigeratte use 6 days ago (11/30/15)  Vaping Use   Vaping status: Every Day  Substance Use Topics   Alcohol use: Yes    Comment: occ   Drug use: No     MEDICATIONS    Home Medication:     Current Medication:  Current Facility-Administered Medications:    acetaminophen  (TYLENOL ) tablet 650 mg, 650 mg, Oral, Q6H PRN, 650 mg at 04/15/24 1111 **OR** acetaminophen  (TYLENOL ) suppository 650 mg, 650 mg, Rectal, Q6H PRN, Fausto Sor A, DO   ascorbic acid (VITAMIN C) tablet 500 mg, 500 mg, Oral, Daily, Von Bellis, MD, 500 mg at 04/16/24 9075   budesonide -glycopyrrolate -formoterol (BREZTRI) 160-9-4.8 MCG/ACT inhaler 2 puff, 2 puff, Inhalation, BID, Fausto Sor A, DO, 2 puff at 04/16/24 2116   cyanocobalamin  (VITAMIN B12) tablet 500 mcg, 500 mcg, Oral, Daily, Kumar, Dileep, MD, 500 mcg at 04/16/24 0923   doxycycline (VIBRA-TABS) tablet 100 mg, 100 mg, Oral, Q12H, Kelbie Moro, MD, 100 mg at 04/16/24 2116   DULoxetine  (CYMBALTA ) DR capsule 30 mg, 30 mg, Oral, BID, Fausto Sor A, DO, 30 mg at 04/16/24 2115   enoxaparin  (LOVENOX ) injection 40 mg, 40 mg, Subcutaneous, Q24H, Fausto Sor A, DO, 40 mg at 04/16/24 2114   feeding supplement (ENSURE ENLIVE / ENSURE PLUS) liquid 237 mL, 237 mL, Oral, BID BM, Fausto Sor A, DO, 237 mL at 04/16/24 1638   folic acid (FOLVITE) tablet 1 mg, 1 mg, Oral, Daily, Von Bellis, MD, 1 mg at 04/16/24 9074   furosemide  (LASIX ) tablet 20 mg, 20 mg, Oral, Daily, Von Bellis, MD, 20 mg at 04/16/24 9075   gabapentin  (NEURONTIN )  capsule 300 mg, 300 mg, Oral, TID, Fausto Sor A, DO, 300 mg at 04/16/24 2115   guaiFENesin  (MUCINEX ) 12 hr tablet 600 mg, 600 mg, Oral, BID, Fausto Sor A, DO, 600 mg at 04/16/24 2115   hydrocortisone  cream 1 %, , Topical, TID PRN, Von Bellis, MD   ipratropium-albuterol  (DUONEB) 0.5-2.5 (3) MG/3ML nebulizer solution 3 mL, 3 mL, Nebulization, BID, Von Bellis, MD   iron  polysaccharides (NIFEREX) capsule 150 mg, 150 mg, Oral, Daily, Von Bellis, MD, 150 mg at 04/16/24 9051   lamoTRIgine  (LAMICTAL ) tablet 100 mg, 100 mg, Oral, Daily, Fausto Sor A, DO, 100 mg at 04/16/24 9075   levalbuterol (XOPENEX) nebulizer solution 0.63 mg, 0.63 mg, Nebulization, Q3H PRN, Fausto Sor A, DO   levothyroxine (SYNTHROID) tablet 50 mcg, 50 mcg, Oral, QAC breakfast, Fausto,  Kelly A, DO, 50 mcg at 04/17/24 0700   methylPREDNISolone  sodium succinate  (SOLU-MEDROL ) 40 mg/mL injection 40 mg, 40 mg, Intravenous, Daily, Ashantee Deupree, MD   metoprolol  succinate (TOPROL -XL) 24 hr tablet 100 mg, 100 mg, Oral, Daily, Fausto Sor A, DO, 100 mg at 04/16/24 9076   ondansetron  (ZOFRAN ) tablet 4 mg, 4 mg, Oral, Q6H PRN **OR** ondansetron  (ZOFRAN ) injection 4 mg, 4 mg, Intravenous, Q6H PRN, Fausto Sor A, DO   polyethylene glycol (MIRALAX  / GLYCOLAX ) packet 17 g, 17 g, Oral, Daily PRN, Fausto Sor A, DO   QUEtiapine  (SEROQUEL ) tablet 25 mg, 25 mg, Oral, QHS PRN, Fausto Sor A, DO, 25 mg at 04/16/24 2352   sorbitol 70 % solution 30 mL, 30 mL, Oral, Daily PRN, Fausto Sor A, DO   Vitamin D (Ergocalciferol) (DRISDOL) 1.25 MG (50000 UNIT) capsule 50,000 Units, 50,000 Units, Oral, Q7 days, Von Bellis, MD, 50,000 Units at 04/15/24 1209    ALLERGIES   Penicillins     REVIEW OF SYSTEMS    Review of Systems:  Gen:  Denies  fever, sweats, chills weigh loss  HEENT: Denies blurred vision, double vision, ear pain, eye pain, hearing loss, nose bleeds, sore throat Cardiac:  No dizziness,  chest pain or heaviness, chest tightness,edema Resp:   reports dyspnea chronically  Gi: Denies swallowing difficulty, stomach pain, nausea or vomiting, diarrhea, constipation, bowel incontinence Gu:  Denies bladder incontinence, burning urine Ext:   Denies Joint pain, stiffness or swelling Skin: Denies  skin rash, easy bruising or bleeding or hives Endoc:  Denies polyuria, polydipsia , polyphagia or weight change Psych:   Denies depression, insomnia or hallucinations   Other:  All other systems negative   VS: BP (!) 160/101 (BP Location: Left Arm)   Pulse 87   Temp 97.8 F (36.6 C) (Oral)   Resp 18   Ht 5' 3 (1.6 m)   Wt 49.4 kg   SpO2 93%   BMI 19.31 kg/m      PHYSICAL EXAM    GENERAL:NAD, no fevers, chills, no weakness no fatigue HEAD: Normocephalic, atraumatic.  EYES: Pupils equal, round, reactive to light. Extraocular muscles intact. No scleral icterus.  MOUTH: Moist mucosal membrane. Dentition intact. No abscess noted.  EAR, NOSE, THROAT: Clear without exudates. No external lesions.  NECK: Supple. No thyromegaly. No nodules. No JVD.  PULMONARY: decreased breath sounds with mild rhonchi worse at bases bilaterally.  CARDIOVASCULAR: S1 and S2. Regular rate and rhythm. No murmurs, rubs, or gallops. No edema. Pedal pulses 2+ bilaterally.  GASTROINTESTINAL: Soft, nontender, nondistended. No masses. Positive bowel sounds. No hepatosplenomegaly.  MUSCULOSKELETAL: No swelling, clubbing, or edema. Range of motion full in all extremities.  NEUROLOGIC: Cranial nerves II through XII are intact. No gross focal neurological deficits. Sensation intact. Reflexes intact.  SKIN: No ulceration, lesions, rashes, or cyanosis. Skin warm and dry. Turgor intact.  PSYCHIATRIC: Mood, affect within normal limits. The patient is awake, alert and oriented x 3. Insight, judgment intact.       IMAGING   CLINICAL DATA:  Severe shortness of breath.   EXAM: PORTABLE CHEST 1 VIEW   COMPARISON:   Radiograph and CT 04/25/2023   FINDINGS: Multifocal pulmonary opacities in the right mid lower lung zone suspicious for pneumonia. Background emphysema with chronic scarring and bulla in the lateral left upper lobe. Mediastinal contours are distorted due due to scoliosis. The heart is normal in size with aortic atherosclerosis and tortuosity. No significant pleural effusion. Thoracic scoliosis.   IMPRESSION: 1.  Multifocal pulmonary opacities in the right mid lower lung zone suspicious for pneumonia. 2. Background emphysema with chronic scarring and bulla in the lateral left upper lobe.     Electronically Signed   By: Andrea Gasman M.D.   On: 04/13/2024 14:10     Contains abnormal data Blood Culture ID Panel (Reflexed) Order: [id number]  Status: Final result     Next appt: None   Test Result Released: Yes (not seen)   0 Result Notes    Component Ref Range & Units (hover) 4 d ago  Enterococcus faecalis NOT DETECTED  Enterococcus Faecium NOT DETECTED  Listeria monocytogenes NOT DETECTED  Staphylococcus species DETECTED Abnormal   Comment: CRITICAL RESULT CALLED TO, READ BACK BY AND VERIFIED WITH: NATHAN PARK 1703 04/14/24 MU  Staphylococcus aureus (BCID) NOT DETECTED  Staphylococcus epidermidis NOT DETECTED  Staphylococcus lugdunensis NOT DETECTED  Streptococcus species NOT DETECTED  Streptococcus agalactiae NOT DETECTED  Streptococcus pneumoniae NOT DETECTED  Streptococcus pyogenes NOT DETECTED  A.calcoaceticus-baumannii NOT DETECTED  Bacteroides fragilis NOT DETECTED  Enterobacterales NOT DETECTED  Enterobacter cloacae complex NOT DETECTED  Escherichia coli NOT DETECTED  Klebsiella aerogenes NOT DETECTED  Klebsiella oxytoca NOT DETECTED  Klebsiella pneumoniae NOT DETECTED  Proteus species NOT DETECTED  Salmonella species NOT DETECTED  Serratia marcescens NOT DETECTED  Haemophilus influenzae NOT DETECTED  Neisseria meningitidis NOT DETECTED  Pseudomonas  aeruginosa NOT DETECTED  Stenotrophomonas maltophilia NOT DETECTED  Candida albicans NOT DETECTED  Candida auris NOT DETECTED  Candida glabrata NOT DETECTED      ASSESSMENT/PLAN     Community acquired pneumonia    - RVP- negative    - procalcitonin trend    - CRP - 10    - empiric therapy with zithromax  and rocephin  IV    - MRSA prcr     - AFB resp cultures     - fungitell    - legionella -negative    - strep pneumoniae ur ag-negative    - histoplasma ur ag    - gram stain and culture sputum     -there is mild AKI precluding CT chest with IV contrast , will give LR and consider in am to perform contrasted CT chest     Acute exacerbation of COPD - moderate  -continue nebulizer therapy with xopenex -agree with steroids - currently at 60mg  BID solumedrol -Incentive spirometry  -PT/OT    AKI - KDIGO 2 -suspect this is due to ischemia with acute on chronic hypoxemia -will rehydrate with LR and recheck BMP in am -hopefully we can get CT chest with contrast after renal indices are within reference range.   Thank you for allowing me to participate in the care of this patient.   Patient/Family are satisfied with care plan and all questions have been answered.    Provider disclosure: Patient with at least one acute or chronic illness or injury that poses a threat to life or bodily function and is being managed actively during this encounter.  All of the below services have been performed independently by signing provider:  review of prior documentation from internal and or external health records.  Review of previous and current lab results.  Interview and comprehensive assessment during patient visit today. Review of current and previous chest radiographs/CT scans. Discussion of management and test interpretation with health care team and patient/family.   This document was prepared using Dragon voice recognition software and may include unintentional dictation errors.      Berenis Corter, M.D.  Division of Pulmonary & Critical Care Medicine

## 2024-04-17 NOTE — Progress Notes (Signed)
  Echocardiogram 2D Echocardiogram has been performed.  Regina Bernard 04/17/2024, 10:42 AM

## 2024-04-17 NOTE — Progress Notes (Signed)
 [facility] Progress Note  Patient: Regina Bernard    FMW:[id number]  DOA: 04/13/2024     Date of Service: the patient was seen and examined on 04/17/2024  Chief Complaint  Patient presents with   Shortness of Breath   Brief hospital course: Regina Bernard is a 68 y.o. female with medical history significant of advanced COPD on chronic prednisone  and 4-5 L/min O2 at baseline, history of breast cancer, hypertension who presented to the ED today by EMS for evaluation of progressively worsening shortness of breath.  Patient reports about 3 days difficulty breathing getting worse.  She denies fevers but reports chills, also reports productive cough, diminished appetite with poor p.o. intake, feeling dehydrated.  She notes her baseline dyspnea seem to get worse several weeks ago.  She denies abdominal pain nausea vomiting diarrhea, headache dizziness, unilateral weakness numbness or tingling, hematuria dysuria or other symptoms.  No known sick contacts.  EMS was reportedly called yesterday but patient refused transfer to the hospital at that time.  On arrival to the ED today, patient was on nonrebreather mask with reported O2 sat of only 72% and in respiratory distress.  Patient was emergently placed on BiPAP with improved O2 sats and work of breathing.   ED course: Temp 98.4 F, initial HR 140, RR 30, BP 133/84, SpO2 100% on BiPAP. Labs obtained including CMP and CBC were notable for sodium 129, chloride 87, nonfasting glucose 100, alk phos 137, albumin 3.4, WBC significantly elevated 28.4, hemoglobin 11.5.  BNP was 127.3.  Troponin normal 14.  Venous blood gas pH 7.42, pCO2 normal 59.     Chest x-ray showed multifocal pulmonary opacities in the right mid and lower lung zone suspicious for pneumonia with background emphysema and chronic scarring and bulla in the lateral left upper lobe.   Patient is being admitted to progressive care unit for further evaluation and management of acute on  chronic respiratory failure with hypoxia in the setting of sepsis due to community-acquired pneumonia.    Assessment and Plan:  # Severe sepsis due to multifocal pneumonia Severe sepsis POA as evidenced by tachycardia, tachypnea, leukocytosis, organ dysfunction evidenced by acute on chronic respiratory failure. S/p IV Levaquin  given in the ED.  # Acute on chronic respiratory failure with hypoxia Advanced COPD -on chronic prednisone , on 4-5 L/min baseline oxygen .  Presented in respiratory distress with O2 sat 72% on nonrebreather mask reported by EDP -- s/p IVF due to sepsis, given history of CHF -1 L bolus -- Patient has penicillin allergy but has tolerated cephalosporins -- IV antibiotics with Rocephin  and Zithromax  -- IV Solu-Medrol  60 mg twice daily -- Scheduled Mucinex  -- Follow blood cultures, 1/4 GPC most likely contamination -- Strep pneumo and Legionella antigen negative -- Continuous pulse ox -- BiPAP as needed -- Supplemental O2, maintain sats 88 to 94% -- Scheduled DuoNebs, as needed Xopenex -- Substitute Breztri for home Trelegy -- N.p.o. while on BiPAP, may take short breaks for ice chips --RVP panel negative CT chest: Increased fibrotic changes right lung base, no definitive acute infiltrates.  Chronic scarring left lung, cavitary areas increased but no findings to suggest superinfection.  Pulmonary emphysema.  Increased risk of cancer need to be monitored as an outpatient Pulmonary consulted, recommended bronchoscopy as an outpatient and DC plan in 1 to 2 days.   # Chronic HFpEF -appears euvolemic, compensated.  Echo in September EF 55%, grade 1 diastolic dysfunction # Essential hypertension -- Continue home Lasix   -- Continue home  metoprolol  -- Daily weights to monitor volume status   # Hypothyroidism -continue home Synthroid   # Neuropathy -continue home gabapentin    # Depression/anxiety -continue home Cymbalta  # Insomnia -continue home Seroquel    # Anemia  secondary to iron  deficiency. Tsat 9%, started oral iron  supplement with vitamin C.  Follow with PCP to repeat iron  profile after 3 to 6 months. Folic acid level 7.6, at lower end.  Started folic acid 1 mg p.o. daily. B12 level 431, within normal range  # Vitamin D deficiency: started vitamin D 50,000 units p.o. weekly, follow with PCP to repeat vitamin D level after 3 to 6 months.  # Isotonic hyponatremia,  Serum osmolality 281 Na 129--123>132 S/p sodium chloride  1 g p.o. twice daily, d/c;d on 10/10 Check sodium level daily  # Hyperkalemia, mild, Lokelma  given. Continue low potassium diet Check electrolytes daily  # Hypomagnesemia, mag repleted. # Hypophosphatemia, Phos repleted. Check electrolytes daily and replete as needed.   Body mass index is 19.31 kg/m.  Interventions:   Diet: Regular diet DVT Prophylaxis: Subcutaneous Lovenox    Advance goals of care discussion: Full code  Family Communication: family was not present at bedside, at the time of interview.  The pt provided permission to discuss medical plan with the family. Opportunity was given to ask question and all questions were answered satisfactorily.   Disposition:  Pt is from Home, admitted with Resp failure, PNA sepsis, still has resp failure on IV Abx, which precludes a safe discharge. Discharge to Home, when stable, may need few days to improve.  Subjective: No significant events overnight, patient was laying comfortably in the bed, she was alert with sleepy, stated that she is tired and not feeling well as compared to yesterday.  Still has cough with some phlegm production. Denied any other complaints.     Physical Exam: General: Mild respiratory distress, short of breath  Appear in mild distress, affect appropriate Eyes: PERRLA ENT: Oral Mucosa Clear, moist  Neck: no JVD,  Cardiovascular: S1 and S2 Present, no Murmur,  Respiratory: Increased respiratory effort, mild crackles and wheezes,  gradually improving Abdomen: Bowel Sound present, Soft and no tenderness,  Skin: no rashes Extremities: no Pedal edema, no calf tenderness Neurologic: without any new focal findings Gait not checked due to patient safety concerns  Vitals:   04/16/24 2232 04/17/24 0424 04/17/24 0725 04/17/24 0807  BP: (!) 155/84 (!) 161/92  (!) 160/101  Pulse: 86 84  87  Resp:  20  18  Temp: 97.8 F (36.6 C) 97.7 F (36.5 C)  97.8 F (36.6 C)  TempSrc: Oral Oral  Oral  SpO2: 99% 100% 100% 93%  Weight:      Height:        Intake/Output Summary (Last 24 hours) at 04/17/2024 1420 Last data filed at 04/17/2024 1300 Gross per 24 hour  Intake 660 ml  Output 380 ml  Net 280 ml   Filed Weights   04/13/24 1238  Weight: 49.4 kg    Data Reviewed: I have personally reviewed and interpreted daily labs, tele strips, imagings as discussed above. I reviewed all nursing notes, pharmacy notes, vitals, pertinent old records I have discussed plan of care as described above with RN and patient/family.  CBC: Recent Labs  Lab 04/13/24 1248 04/14/24 0224 04/15/24 0420 04/16/24 0618 04/17/24 0321  WBC 28.4* 18.4* 32.4* 13.6* 12.1*  NEUTROABS 25.9*  --   --   --   --   HGB 11.5* 10.0* 9.8* 11.0* 9.3*  HCT 33.9* 30.0* 29.8* 34.1* 29.4*  MCV 100.3* 101.4* 102.1* 104.3* 105.4*  PLT 368 318 382 383 345   Basic Metabolic Panel: Recent Labs  Lab 04/13/24 1248 04/14/24 0224 04/15/24 0420 04/16/24 0618 04/17/24 0321  NA 129* 129* 123* 132* 132*  K 4.4 4.3 5.1 5.4* 4.3  CL 87* 91* 88* 94* 93*  CO2 30 27 27 30  32  GLUCOSE 100* 187* 115* 122* 73  BUN 21 21 37* 44* 36*  CREATININE 0.82 1.16* 1.26* 1.00 1.02*  CALCIUM 9.3 9.2 9.0 9.1 8.5*  MG 1.8 1.7 2.0 2.1 1.6*  PHOS  --  2.4* 3.3 2.7 2.3*    Studies: ECHOCARDIOGRAM COMPLETE Result Date: 04/17/2024    ECHOCARDIOGRAM REPORT   Patient Name:   Regina Bernard Regina Bernard Date of Exam: 04/17/2024 Medical Rec #:  [id number]        Height:       63.0 in  Accession #:    [id number]       Weight:       109.0 lb Date of Birth:  1955-08-08        BSA:          1.494 m Patient Age:    68 years         BP:           160/101 mmHg Patient Gender: F                HR:           89 bpm. Exam Location:  [facility] Procedure: 2D Echo, Cardiac Doppler and Color Doppler (Both Spectral and Color            Flow Doppler were utilized during procedure). Indications:     Bacteremia R78.81  History:         Patient has no prior history of Echocardiogram examinations.  Sonographer:     Thedora Louder RDCS, FASE Referring Phys:  [id number] ELVAN SOR Diagnosing Phys: Marsa Dooms MD  Sonographer Comments: Technically difficult study due to poor echo windows. Image acquisition challenging due to respiratory motion and Image acquisition challenging due to patient body habitus. IMPRESSIONS  1. Left ventricular ejection fraction, by estimation, is 55 to 60%. The left ventricle has normal function. The left ventricle has no regional wall motion abnormalities. Left ventricular diastolic parameters are consistent with Grade I diastolic dysfunction (impaired relaxation).  2. Right ventricular systolic function is normal. The right ventricular size is normal.  3. The mitral valve is normal in structure. No evidence of mitral valve regurgitation. No evidence of mitral stenosis.  4. The aortic valve is normal in structure. Aortic valve regurgitation is not visualized. Aortic valve sclerosis is present, with no evidence of aortic valve stenosis.  5. The inferior vena cava is normal in size with greater than 50% respiratory variability, suggesting right atrial pressure of 3 mmHg. FINDINGS  Left Ventricle: Left ventricular ejection fraction, by estimation, is 55 to 60%. The left ventricle has normal function. The left ventricle has no regional wall motion abnormalities. Strain was performed and the global longitudinal strain is indeterminate. The left ventricular internal cavity size was normal in  size. There is no left ventricular hypertrophy. Left ventricular diastolic parameters are consistent with Grade I diastolic dysfunction (impaired relaxation). Right Ventricle: The right ventricular size is normal. No increase in right ventricular wall thickness. Right ventricular systolic function is normal. Left Atrium: Left atrial size was normal in size. Right Atrium: Right atrial size was  normal in size. Pericardium: There is no evidence of pericardial effusion. Mitral Valve: The mitral valve is normal in structure. No evidence of mitral valve regurgitation. No evidence of mitral valve stenosis. Tricuspid Valve: The tricuspid valve is normal in structure. Tricuspid valve regurgitation is not demonstrated. No evidence of tricuspid stenosis. Aortic Valve: The aortic valve is normal in structure. Aortic valve regurgitation is not visualized. Aortic valve sclerosis is present, with no evidence of aortic valve stenosis. Aortic valve peak gradient measures 6.2 mmHg. Pulmonic Valve: The pulmonic valve was normal in structure. Pulmonic valve regurgitation is not visualized. No evidence of pulmonic stenosis. Aorta: The aortic root is normal in size and structure. Venous: The inferior vena cava is normal in size with greater than 50% respiratory variability, suggesting right atrial pressure of 3 mmHg. IAS/Shunts: No atrial level shunt detected by color flow Doppler. Additional Comments: 3D was performed not requiring image post processing on an independent workstation and was indeterminate.  LEFT VENTRICLE PLAX 2D LVIDd:         3.10 cm   Diastology LVIDs:         2.20 cm   LV e' medial:    8.16 cm/s LV PW:         1.10 cm   LV E/e' medial:  8.0 LV IVS:        1.10 cm   LV e' lateral:   11.00 cm/s LVOT diam:     2.00 cm   LV E/e' lateral: 5.9 LV SV:         57 LV SV Index:   38 LVOT Area:     3.14 cm  LEFT ATRIUM             Index        RIGHT ATRIUM           Index LA diam:        3.20 cm 2.14 cm/m   RA Area:     14.50  cm LA Vol (A2C):   41.9 ml 28.05 ml/m  RA Volume:   35.50 ml  23.76 ml/m LA Vol (A4C):   24.3 ml 16.27 ml/m LA Biplane Vol: 31.9 ml 21.35 ml/m  AORTIC VALVE                 PULMONIC VALVE AV Area (Vmax): 2.63 cm     PV Vmax:        0.88 m/s AV Vmax:        124.00 cm/s  PV Peak grad:   3.1 mmHg AV Peak Grad:   6.2 mmHg     RVOT Peak grad: 2 mmHg LVOT Vmax:      104.00 cm/s LVOT Vmean:     61.300 cm/s LVOT VTI:       0.181 m  AORTA Ao Root diam: 2.40 cm MITRAL VALVE MV Area (PHT): 4.57 cm    SHUNTS MV Decel Time: 166 msec    Systemic VTI:  0.18 m MV E velocity: 65.00 cm/s  Systemic Diam: 2.00 cm MV A velocity: 99.60 cm/s MV E/A ratio:  0.65 Marsa Dooms MD Electronically signed by Marsa Dooms MD Signature Date/Time: 04/17/2024/11:24:57 AM    Final     Scheduled Meds:  amLODipine  5 mg Oral QPM   vitamin C  500 mg Oral Daily   budesonide -glycopyrrolate -formoterol  2 puff Inhalation BID   vitamin B-12  500 mcg Oral Daily   doxycycline  100 mg Oral Q12H   DULoxetine   30 mg Oral BID   enoxaparin  (LOVENOX ) injection  40 mg Subcutaneous Q24H   feeding supplement  237 mL Oral BID BM   folic acid  1 mg Oral Daily   furosemide   20 mg Oral Daily   gabapentin   300 mg Oral TID   guaiFENesin   600 mg Oral BID   ipratropium-albuterol   3 mL Nebulization BID   iron  polysaccharides  150 mg Oral Daily   lamoTRIgine   100 mg Oral Daily   levothyroxine  50 mcg Oral QAC breakfast   methylPREDNISolone  (SOLU-MEDROL ) injection  40 mg Intravenous Daily   metoprolol  succinate  100 mg Oral Daily   Vitamin D (Ergocalciferol)  50,000 Units Oral Q7 days   Continuous Infusions:  sodium PHOSPHATE  IVPB (in mmol) 30 mmol (04/17/24 1200)    PRN Meds: acetaminophen  **OR** acetaminophen , hydrocortisone  cream, levalbuterol, ondansetron  **OR** ondansetron  (ZOFRAN ) IV, polyethylene glycol, QUEtiapine , sorbitol  Time spent: 35 minutes  Author: ELVAN SOR. MD [facility] 04/17/2024 2:20 PM  To  reach On-call, see care teams to locate the attending and reach out to them via [email or web address]. If 7PM-7AM, please contact night-coverage If you still have difficulty reaching the attending provider, please page the [facility] (Director on Call) for [facility] on amion for assistance.

## 2024-04-17 NOTE — Progress Notes (Signed)
 Mobility Specialist Progress Note:    04/17/24 0828  Mobility  Activity Refused and notified nurse if applicable   Pt refused mobility d/t fatigue. Will attempt at a later time, all needs met.  Sherrilee Ditty Mobility Specialist Please contact via Special educational needs teacher or  Rehab office at [phone]

## 2024-04-18 DIAGNOSIS — R6520 Severe sepsis without septic shock: Secondary | ICD-10-CM | POA: Diagnosis not present

## 2024-04-18 DIAGNOSIS — A419 Sepsis, unspecified organism: Secondary | ICD-10-CM | POA: Diagnosis not present

## 2024-04-18 LAB — CBC
HCT: 31.1 % — ABNORMAL LOW (ref 36.0–46.0)
Hemoglobin: 10.1 g/dL — ABNORMAL LOW (ref 12.0–15.0)
MCH: 33.4 pg (ref 26.0–34.0)
MCHC: 32.5 g/dL (ref 30.0–36.0)
MCV: 103 fL — ABNORMAL HIGH (ref 80.0–100.0)
Platelets: 362 K/uL (ref 150–400)
RBC: 3.02 MIL/uL — ABNORMAL LOW (ref 3.87–5.11)
RDW: 13.4 % (ref 11.5–15.5)
WBC: 15.8 K/uL — ABNORMAL HIGH (ref 4.0–10.5)
nRBC: 0 % (ref 0.0–0.2)

## 2024-04-18 LAB — BASIC METABOLIC PANEL WITH GFR
Anion gap: 9 (ref 5–15)
BUN: 35 mg/dL — ABNORMAL HIGH (ref 8–23)
CO2: 35 mmol/L — ABNORMAL HIGH (ref 22–32)
Calcium: 8.6 mg/dL — ABNORMAL LOW (ref 8.9–10.3)
Chloride: 91 mmol/L — ABNORMAL LOW (ref 98–111)
Creatinine, Ser: 1.07 mg/dL — ABNORMAL HIGH (ref 0.44–1.00)
GFR, Estimated: 57 mL/min — ABNORMAL LOW (ref >60)
Glucose, Bld: 74 mg/dL (ref 70–99)
Potassium: 4.5 mmol/L (ref 3.5–5.1)
Sodium: 135 mmol/L (ref 135–145)

## 2024-04-18 LAB — CULTURE, BLOOD (ROUTINE X 2): Culture: NO GROWTH

## 2024-04-18 LAB — PHOSPHORUS: Phosphorus: 3.4 mg/dL (ref 2.5–4.6)

## 2024-04-18 LAB — MAGNESIUM: Magnesium: 1.9 mg/dL (ref 1.7–2.4)

## 2024-04-18 MED ORDER — PREDNISONE 10 MG PO TABS: ORAL_TABLET | ORAL | 0 refills | Status: AC

## 2024-04-18 MED ORDER — ACETAMINOPHEN 325 MG PO TABS: 650.0000 mg | ORAL_TABLET | Freq: Four times a day (QID) | ORAL | Status: AC | PRN

## 2024-04-18 MED ORDER — POLYSACCHARIDE IRON COMPLEX 150 MG PO CAPS: 150.0000 mg | ORAL_CAPSULE | Freq: Every day | ORAL | 0 refills | Status: AC

## 2024-04-18 MED ORDER — PREDNISONE 50 MG PO TABS: 50.0000 mg | ORAL_TABLET | Freq: Every day | ORAL | Status: DC

## 2024-04-18 MED ORDER — AMLODIPINE BESYLATE 5 MG PO TABS: 5.0000 mg | ORAL_TABLET | Freq: Every evening | ORAL | 11 refills | Status: AC

## 2024-04-18 MED ORDER — VITAMIN D (ERGOCALCIFEROL) 1.25 MG (50000 UNIT) PO CAPS: 50000.0000 [IU] | ORAL_CAPSULE | ORAL | 0 refills | Status: AC

## 2024-04-18 MED ORDER — GUAIFENESIN ER 600 MG PO TB12: 600.0000 mg | ORAL_TABLET | Freq: Two times a day (BID) | ORAL | 0 refills | Status: AC

## 2024-04-18 MED ORDER — INFLUENZA VAC SPLIT HIGH-DOSE 0.5 ML IM SUSY
0.5000 mL | PREFILLED_SYRINGE | INTRAMUSCULAR | Status: DC
  Filled 2024-04-18: qty 0.5

## 2024-04-18 MED ORDER — INFLUENZA VAC SPLIT HIGH-DOSE 0.5 ML IM SUSY
0.5000 mL | PREFILLED_SYRINGE | Freq: Once | INTRAMUSCULAR | Status: AC
  Administered 2024-04-18: 0.5 mL via INTRAMUSCULAR
  Filled 2024-04-18: qty 0.5

## 2024-04-18 MED ORDER — OMEPRAZOLE 20 MG PO CPDR: 20.0000 mg | DELAYED_RELEASE_CAPSULE | Freq: Every day | ORAL | 0 refills | Status: AC

## 2024-04-18 MED ORDER — DOXYCYCLINE HYCLATE 100 MG PO TABS: 100.0000 mg | ORAL_TABLET | Freq: Two times a day (BID) | ORAL | 0 refills | Status: AC

## 2024-04-18 MED ORDER — ASCORBIC ACID 500 MG PO TABS: 500.0000 mg | ORAL_TABLET | Freq: Every day | ORAL | 0 refills | Status: AC

## 2024-04-18 MED ORDER — FOLIC ACID 1 MG PO TABS: 1.0000 mg | ORAL_TABLET | Freq: Every day | ORAL | 0 refills | Status: AC

## 2024-04-18 MED ORDER — CYANOCOBALAMIN 1000 MCG PO TABS: 1000.0000 ug | ORAL_TABLET | Freq: Every day | ORAL | 0 refills | Status: AC

## 2024-04-18 NOTE — Discharge Summary (Signed)
 [facility] Discharge Summary   Patient: Regina Bernard FMW:[id number]  PCP: Glover Lenis, MD  Date of admission: 04/13/2024   Date of discharge:  04/18/2024     Discharge Diagnoses:  Principal Problem:   Severe sepsis [facility])   Admitted From: Home Disposition:   Home  Recommendations for Outpatient Follow-up:  F/u with PCP in 1 wk F/u with Pulmo in 1-2 wks for bronch Follow up LABS/TEST:  Repeat CBC, BMP, Mag and Phos  in 1-2 weeks Repeat Iron  profile, B12, Folate and Vit D level in between 3-6 months    Follow-up Information     Glover Lenis, MD Follow up in 1 week(s).   Specialty: Family Medicine Contact information: [address] [place] [address] [phone]         Parris Manna, MD Follow up in 1 week(s).   Specialty: Pulmonary Disease Contact information: [address] [place] [address] [phone]                Diet recommendation: Regular diet  Activity: The patient is advised to gradually reintroduce usual activities, as tolerated  Discharge Condition: stable  Code Status: Full code   History of present illness: As per the H and P dictated on admission.  Hospital Course:  Regina Bernard is a 68 y.o. female with medical history significant of advanced COPD on chronic prednisone  and 4-5 L/min O2 at baseline, history of breast cancer, hypertension who presented to the ED today by EMS for evaluation of progressively worsening shortness of breath.  Patient reports about 3 days difficulty breathing getting worse.  She denies fevers but reports chills, also reports productive cough, diminished appetite with poor p.o. intake, feeling dehydrated.  She notes her baseline dyspnea seem to get worse several weeks ago.  EMS was reportedly called yesterday but patient refused transfer to the hospital at that time.  On arrival to the ED today, patient was on nonrebreather mask with reported O2 sat of only 72% and in respiratory  distress.  Patient was emergently placed on BiPAP with improved O2 sats and work of breathing.   ED course: Temp 98.4 F, initial HR 140, RR 30, BP 133/84, SpO2 100% on BiPAP. Labs obtained including CMP and CBC were notable for sodium 129, chloride 87, nonfasting glucose 100, alk phos 137, albumin 3.4, WBC significantly elevated 28.4, hemoglobin 11.5.  BNP was 127.3.  Troponin normal 14.  Venous blood gas pH 7.42, pCO2 normal 59.     Chest x-ray: multifocal pulmonary opacities in the right mid and lower lung zone suspicious for pneumonia with background emphysema and chronic scarring and bulla in the lateral left upper lobe.   Patient was admitted to progressive care unit for further evaluation and management of acute on chronic respiratory failure with hypoxia in the setting of sepsis due to community-acquired pneumonia.     Assessment and Plan:   # Severe sepsis due to multifocal pneumonia Severe sepsis POA as evidenced by tachycardia, tachypnea, leukocytosis, organ dysfunction evidenced by acute on chronic respiratory failure. S/p IV Levaquin  given in the ED. patient was treated below, sepsis resolved.   # Acute on chronic respiratory failure with hypoxia Advanced COPD -on chronic prednisone , on 4-5 L/min baseline oxygen .  Presented in respiratory distress with O2 sat 72% on nonrebreather mask reported by EDP s/p IVF due to sepsis, given history of CHF -1 L bolus Patient has penicillin allergy but has tolerated cephalosporins S/p IV antibiotics with Rocephin  and Zithromax  S/p  IV Solu-Medrol  60 mg twice daily. Scheduled Mucinex  600 BID Blood cultures, 1/4 GPC most likely contamination.  Repeat blood cultures NGTD. Strep pneumo and Legionella antigen negative.  RVP panel negative Continue supplemental O2 inhalation, wean down to her baseline.  Continued home inhalers, use DuoNeb as needed at home. CT chest: Increased fibrotic changes right lung base, no definitive acute infiltrates.  Chronic  scarring left lung, cavitary areas increased but no findings to suggest superinfection.  Pulmonary emphysema.  Increased risk of cancer need to be monitored as an outpatient Pulmonary consulted: recommended bronchoscopy as an outpatient. Patient was cleared to discharge on oral antibiotics doxycycline twice daily for 5 days.  Prednisone  50 mg p.o. daily x 1 dose, gradual taper 5 mg/day.  Patient was advised to continue prednisone  5 mg home dose after finishing tapering dose.  Follow with pulmonary in 1 to 2-week for bronchoscopy as an outpatient.     # Chronic HFpEF -appears euvolemic, compensated.  Echo in September EF 55%, grade 1 diastolic dysfunction # Essential hypertension: Continue home Lasix , metoprolol  Started amlodipine 5 mg every evening if SBP greater than 140 mmHg.  Patient was advised to monitor BP at home and follow with PCP to titrate medications accordingly.   # Hypothyroidism -continue home Synthroid   # Neuropathy -continue home gabapentin    # Depression/anxiety -continue home Cymbalta  # Insomnia -continue home Seroquel    # Anemia secondary to iron  deficiency. Tsat 9%, started oral iron  supplement with vitamin C.  Follow with PCP to repeat iron  profile after 3 to 6 months. Folic acid level 7.6, at lower end.  Started folic acid 1 mg p.o. daily. B12 level 431, within normal range.  Continue B12 supplement to prevent deficiency.   # Vitamin D deficiency: started vitamin D 50,000 units p.o. weekly, follow with PCP to repeat vitamin D level after 3 to 6 months.   # Isotonic hyponatremia, resolved Serum osmolality 281 Na 129--123>132>> 135 S/p sodium chloride  1 g p.o. twice daily, d/c;d on 10/10   # Hyperkalemia, mild, Lokelma  given.  Resolved # Hypomagnesemia, mag repleted.  Resolved # Hypophosphatemia, Phos repleted.  Resolved    Body mass index is 19.31 kg/m.  Nutrition Interventions:  Wound 04/14/24 2045 Pressure Injury Sacrum Medial Stage 1 -  Intact skin with  non-blanchable redness of a localized area usually over a bony prominence. (Active)     - Patient was instructed, not to drive, operate heavy machinery, perform activities at heights, swimming or participation in water activities or provide baby sitting services while on Pain, Sleep and Anxiety Medications; until her outpatient Physician has advised to do so again.  - Also recommended to not to take more than prescribed Pain, Sleep and Anxiety Medications.  Patient was ambulatory without any assistance. On the day of the discharge the patient's vitals were stable, and no other acute medical condition were reported by patient. the patient was felt safe to be discharge at Home .  Consultants: Pulmonary Procedures: None  Discharge Exam: General: Appear in no distress, Oral Mucosa Clear, moist. Cardiovascular: S1 and S2 Present, no Murmur, Respiratory: normal respiratory effort, Bilateral Air entry present and mild Crackles and mild wheezes, significantly improved Abdomen: Bowel Sound present, Soft and no tenderness. Extremities: no Pedal edema, no calf tenderness Neurology: alert and oriented to time, place, and person affect appropriate.  Filed Weights   04/13/24 1238  Weight: 49.4 kg   Vitals:   04/18/24 0732 04/18/24 0814  BP:  (!) 164/87  Pulse:  80  Resp:  14  Temp:  98.1 F (36.7 C)  SpO2: 98% 94%    DISCHARGE MEDICATION: Allergies as of 04/18/2024       Reactions   Penicillins Swelling, Rash   Has patient had a PCN reaction causing immediate rash, facial/tongue/throat swelling, SOB or lightheadedness with hypotension: Yes Has patient had a PCN reaction causing severe rash involving mucus membranes or skin necrosis: No Has patient had a PCN reaction that required hospitalization: No Has patient had a PCN reaction occurring within the last 10 years: No If all of the above answers are NO, then may proceed with Cephalosporin use.        Medication List     STOP  taking these medications    ferrous sulfate  325 (65 FE) MG tablet   loperamide  2 MG capsule Commonly known as: IMODIUM        TAKE these medications    acetaminophen  325 MG tablet Commonly known as: TYLENOL  Take 2 tablets (650 mg total) by mouth every 6 (six) hours as needed for mild pain (pain score 1-3), fever, moderate pain (pain score 4-6) or headache. What changed: reasons to take this   albuterol  (2.5 MG/3ML) 0.083% nebulizer solution Commonly known as: PROVENTIL  Take 2.5 mg by nebulization every 6 (six) hours as needed for wheezing.   albuterol  108 (90 Base) MCG/ACT inhaler Commonly known as: VENTOLIN  HFA Inhale 2 puffs into the lungs every 6 (six) hours as needed for wheezing or shortness of breath.   amLODipine 5 MG tablet Commonly known as: NORVASC Take 1 tablet (5 mg total) by mouth every evening.   ascorbic acid 500 MG tablet Commonly known as: VITAMIN C Take 1 tablet (500 mg total) by mouth daily. Start taking on: April 19, 2024   cyanocobalamin  1000 MCG tablet Take 1 tablet (1,000 mcg total) by mouth daily.   doxycycline 100 MG tablet Commonly known as: VIBRA-TABS Take 1 tablet (100 mg total) by mouth every 12 (twelve) hours for 5 days.   DULoxetine  30 MG capsule Commonly known as: CYMBALTA  Take 1 capsule (30 mg total) by mouth 2 (two) times daily.   feeding supplement Liqd Take 237 mLs by mouth 2 (two) times daily between meals.   folic acid 1 MG tablet Commonly known as: FOLVITE Take 1 tablet (1 mg total) by mouth daily. Start taking on: April 19, 2024   furosemide  20 MG tablet Commonly known as: LASIX  Take 20 mg by mouth daily.   gabapentin  300 MG capsule Commonly known as: NEURONTIN  Take 1 capsule (300 mg total) by mouth 3 (three) times daily.   guaiFENesin  600 MG 12 hr tablet Commonly known as: MUCINEX  Take 1 tablet (600 mg total) by mouth 2 (two) times daily for 7 days.   iron  polysaccharides 150 MG capsule Commonly known as:  NIFEREX Take 1 capsule (150 mg total) by mouth daily. Start taking on: April 19, 2024   lamoTRIgine  100 MG tablet Commonly known as: LAMICTAL  Take 1 tablet (100 mg total) by mouth daily.   levothyroxine 50 MCG tablet Commonly known as: SYNTHROID Take 50 mcg by mouth daily before breakfast. Take on an empty stomach with water 30-90 mins before breakfast   metoprolol  succinate 100 MG 24 hr tablet Commonly known as: TOPROL -XL Take 100 mg by mouth daily. Take with or immediately following a meal.   Multi-Vitamins Tabs Take 1 tablet by mouth daily.   nicotine  21 mg/24hr patch Commonly known as: NICODERM CQ  - dosed in mg/24 hours  Place 1 patch (21 mg total) onto the skin daily.   omeprazole  20 MG capsule Commonly known as: PRILOSEC  Take 1 capsule (20 mg total) by mouth daily. What changed:  how much to take when to take this   predniSONE  10 MG tablet Commonly known as: DELTASONE  Take 5 tablets (50 mg total) by mouth daily at 12 noon for 1 day, THEN 4.5 tablets (45 mg total) daily at 12 noon for 1 day, THEN 4 tablets (40 mg total) daily at 12 noon for 1 day, THEN 3.5 tablets (35 mg total) daily at 12 noon for 1 day, THEN 3 tablets (30 mg total) daily at 12 noon for 1 day, THEN 2.5 tablets (25 mg total) daily at 12 noon for 1 day, THEN 2 tablets (20 mg total) daily at 12 noon for 1 day, THEN 1.5 tablets (15 mg total) daily at 12 noon for 1 day, THEN 1 tablet (10 mg total) daily at 12 noon for 1 day. Start taking on: April 19, 2024 What changed: You were already taking a medication with the same name, and this prescription was added. Make sure you understand how and when to take each.   predniSONE  5 MG tablet Commonly known as: DELTASONE  Start after finishing tapering dose Start taking on: April 28, 2024 What changed:  medication strength additional instructions These instructions start on April 28, 2024. If you are unsure what to do until then, ask your doctor or other care  provider.   QUEtiapine  25 MG tablet Commonly known as: SEROQUEL  Take 25 mg by mouth at bedtime as needed (sleep).   senna 8.6 MG Tabs tablet Commonly known as: SENOKOT Take 1 tablet (8.6 mg total) by mouth 2 (two) times daily.   Trelegy Ellipta 100-62.5-25 MCG/INH Aepb Generic drug: Fluticasone -Umeclidin-Vilant Inhale 1 puff into the lungs daily.   Vitamin D (Ergocalciferol) 1.25 MG (50000 UNIT) Caps capsule Commonly known as: DRISDOL Take 1 capsule (50,000 Units total) by mouth every 7 (seven) days. Start taking on: April 22, 2024       Allergies  Allergen Reactions   Penicillins Swelling and Rash    Has patient had a PCN reaction causing immediate rash, facial/tongue/throat swelling, SOB or lightheadedness with hypotension: Yes Has patient had a PCN reaction causing severe rash involving mucus membranes or skin necrosis: No Has patient had a PCN reaction that required hospitalization: No Has patient had a PCN reaction occurring within the last 10 years: No If all of the above answers are NO, then may proceed with Cephalosporin use.    Discharge Instructions     Call MD for:  difficulty breathing, headache or visual disturbances   Complete by: As directed    Call MD for:  extreme fatigue   Complete by: As directed    Call MD for:  persistant dizziness or light-headedness   Complete by: As directed    Call MD for:  persistant nausea and vomiting   Complete by: As directed    Call MD for:  severe uncontrolled pain   Complete by: As directed    Call MD for:  temperature >100.4   Complete by: As directed    Diet general   Complete by: As directed    Discharge instructions   Complete by: As directed    F/u with PCP in 1 wk F/u with Pulmo in 1-2 wks for bronch Repeat CBC, BMP, Mag and Phos  in 1-2 weeks Repeat Iron  profile, B12, Folate and Vit D level in  between 3-6 months   Increase activity slowly   Complete by: As directed    No wound care   Complete by: As  directed        The results of significant diagnostics from this hospitalization (including imaging, microbiology, ancillary and laboratory) are listed below for reference.    Significant Diagnostic Studies: ECHOCARDIOGRAM COMPLETE Result Date: 04/17/2024    ECHOCARDIOGRAM REPORT   Patient Name:   Regina Bernard Date of Exam: 04/17/2024 Medical Rec #:  [id number]        Height:       63.0 in Accession #:    [id number]       Weight:       109.0 lb Date of Birth:  1956-06-26        BSA:          1.494 m Patient Age:    68 years         BP:           160/101 mmHg Patient Gender: F                HR:           89 bpm. Exam Location:  [facility] Procedure: 2D Echo, Cardiac Doppler and Color Doppler (Both Spectral and Color            Flow Doppler were utilized during procedure). Indications:     Bacteremia R78.81  History:         Patient has no prior history of Echocardiogram examinations.  Sonographer:     Thedora Louder RDCS, FASE Referring Phys:  [id number] ELVAN SOR Diagnosing Phys: Marsa Dooms MD  Sonographer Comments: Technically difficult study due to poor echo windows. Image acquisition challenging due to respiratory motion and Image acquisition challenging due to patient body habitus. IMPRESSIONS  1. Left ventricular ejection fraction, by estimation, is 55 to 60%. The left ventricle has normal function. The left ventricle has no regional wall motion abnormalities. Left ventricular diastolic parameters are consistent with Grade I diastolic dysfunction (impaired relaxation).  2. Right ventricular systolic function is normal. The right ventricular size is normal.  3. The mitral valve is normal in structure. No evidence of mitral valve regurgitation. No evidence of mitral stenosis.  4. The aortic valve is normal in structure. Aortic valve regurgitation is not visualized. Aortic valve sclerosis is present, with no evidence of aortic valve stenosis.  5. The inferior vena cava is normal in size with  greater than 50% respiratory variability, suggesting right atrial pressure of 3 mmHg. FINDINGS  Left Ventricle: Left ventricular ejection fraction, by estimation, is 55 to 60%. The left ventricle has normal function. The left ventricle has no regional wall motion abnormalities. Strain was performed and the global longitudinal strain is indeterminate. The left ventricular internal cavity size was normal in size. There is no left ventricular hypertrophy. Left ventricular diastolic parameters are consistent with Grade I diastolic dysfunction (impaired relaxation). Right Ventricle: The right ventricular size is normal. No increase in right ventricular wall thickness. Right ventricular systolic function is normal. Left Atrium: Left atrial size was normal in size. Right Atrium: Right atrial size was normal in size. Pericardium: There is no evidence of pericardial effusion. Mitral Valve: The mitral valve is normal in structure. No evidence of mitral valve regurgitation. No evidence of mitral valve stenosis. Tricuspid Valve: The tricuspid valve is normal in structure. Tricuspid valve regurgitation is not demonstrated. No evidence of tricuspid stenosis. Aortic Valve: The aortic valve  is normal in structure. Aortic valve regurgitation is not visualized. Aortic valve sclerosis is present, with no evidence of aortic valve stenosis. Aortic valve peak gradient measures 6.2 mmHg. Pulmonic Valve: The pulmonic valve was normal in structure. Pulmonic valve regurgitation is not visualized. No evidence of pulmonic stenosis. Aorta: The aortic root is normal in size and structure. Venous: The inferior vena cava is normal in size with greater than 50% respiratory variability, suggesting right atrial pressure of 3 mmHg. IAS/Shunts: No atrial level shunt detected by color flow Doppler. Additional Comments: 3D was performed not requiring image post processing on an independent workstation and was indeterminate.  LEFT VENTRICLE PLAX 2D LVIDd:          3.10 cm   Diastology LVIDs:         2.20 cm   LV e' medial:    8.16 cm/s LV PW:         1.10 cm   LV E/e' medial:  8.0 LV IVS:        1.10 cm   LV e' lateral:   11.00 cm/s LVOT diam:     2.00 cm   LV E/e' lateral: 5.9 LV SV:         57 LV SV Index:   38 LVOT Area:     3.14 cm  LEFT ATRIUM             Index        RIGHT ATRIUM           Index LA diam:        3.20 cm 2.14 cm/m   RA Area:     14.50 cm LA Vol (A2C):   41.9 ml 28.05 ml/m  RA Volume:   35.50 ml  23.76 ml/m LA Vol (A4C):   24.3 ml 16.27 ml/m LA Biplane Vol: 31.9 ml 21.35 ml/m  AORTIC VALVE                 PULMONIC VALVE AV Area (Vmax): 2.63 cm     PV Vmax:        0.88 m/s AV Vmax:        124.00 cm/s  PV Peak grad:   3.1 mmHg AV Peak Grad:   6.2 mmHg     RVOT Peak grad: 2 mmHg LVOT Vmax:      104.00 cm/s LVOT Vmean:     61.300 cm/s LVOT VTI:       0.181 m  AORTA Ao Root diam: 2.40 cm MITRAL VALVE MV Area (PHT): 4.57 cm    SHUNTS MV Decel Time: 166 msec    Systemic VTI:  0.18 m MV E velocity: 65.00 cm/s  Systemic Diam: 2.00 cm MV A velocity: 99.60 cm/s MV E/A ratio:  0.65 Marsa Dooms MD Electronically signed by Marsa Dooms MD Signature Date/Time: 04/17/2024/11:24:57 AM    Final    CT CHEST WO CONTRAST Result Date: 04/15/2024 CLINICAL DATA:  Hypoxia EXAM: CT CHEST WITHOUT CONTRAST TECHNIQUE: Multidetector CT imaging of the chest was performed following the standard protocol without IV contrast. RADIATION DOSE REDUCTION: This exam was performed according to the departmental dose-optimization program which includes automated exposure control, adjustment of the mA and/or kV according to patient size and/or use of iterative reconstruction technique. COMPARISON:  04/25/2023, chest x-ray from 2 days previous FINDINGS: Cardiovascular: Somewhat limited due to lack of IV contrast. Atherosclerotic calcifications of the thoracic aorta are noted. No aneurysmal dilatation is seen. Coronary calcifications are noted. Mediastinum/Nodes:  Thoracic  inlet is within normal limits. No hilar or mediastinal adenopathy is noted. The esophagus as visualized is within normal limits. Lungs/Pleura: The right lung demonstrates apical pleural and parenchymal scarring. Severe emphysematous changes are identified. Chronic appearing fibrotic changes are noted in the right lung base, increased when compared with the prior exam. No focal confluent infiltrate is seen. Persistent scarring is noted in the left upper lobe with retraction and cavitation. The cavitary area has enlarged in size somewhat when compared with the prior exam although no findings to suggest superinfection are identified. Diffuse emphysematous changes are noted. Upper Abdomen: Nodularity of the liver is noted consistent with underlying cirrhosis. No other focal upper abdominal abnormality is noted. Musculoskeletal: Degenerative changes of the thoracic spine are seen. Scoliosis is noted. Changes of prior vertebral augmentation are seen. IMPRESSION: Increase in fibrotic changes in the right lung base which corresponds to that seen on recent plain film. No definitive acute infiltrate is seen. Chronic scarring in the left lung superiorly. The cavitary area has increased in size although no findings to suggest superinfection are noted. Pulmonary emphysema is present. Pulmonary emphysema is an independent risk factor for lung cancer. Recommend patient be considered for lung cancer screening. [place]  Chief Financial Officer. Screening for Lung Cancer: [place]  Licensed conveyancer. JAMA. 2021;325(10):962-970. Aortic Atherosclerosis (ICD10-I70.0) and Emphysema (ICD10-J43.9). Electronically Signed   By: Oneil Devonshire M.D.   On: 04/15/2024 19:57   DG Chest Portable 1 View Result Date: 04/13/2024 CLINICAL DATA:  Severe shortness of breath. EXAM: PORTABLE CHEST 1 VIEW COMPARISON:  Radiograph and CT 04/25/2023 FINDINGS: Multifocal pulmonary opacities in the right mid lower lung  zone suspicious for pneumonia. Background emphysema with chronic scarring and bulla in the lateral left upper lobe. Mediastinal contours are distorted due due to scoliosis. The heart is normal in size with aortic atherosclerosis and tortuosity. No significant pleural effusion. Thoracic scoliosis. IMPRESSION: 1. Multifocal pulmonary opacities in the right mid lower lung zone suspicious for pneumonia. 2. Background emphysema with chronic scarring and bulla in the lateral left upper lobe. Electronically Signed   By: Andrea Gasman M.D.   On: 04/13/2024 14:10    Microbiology: Recent Results (from the past 240 hours)  Resp panel by RT-PCR (RSV, Flu A&B, Covid) Anterior Nasal Swab     Status: None   Collection Time: 04/13/24 12:48 PM   Specimen: Anterior Nasal Swab  Result Value Ref Range Status   SARS Coronavirus 2 by RT PCR NEGATIVE NEGATIVE Final    Comment: (NOTE) SARS-CoV-2 target nucleic acids are NOT DETECTED.  The SARS-CoV-2 RNA is generally detectable in upper respiratory specimens during the acute phase of infection. The lowest concentration of SARS-CoV-2 viral copies this assay can detect is 138 copies/mL. A negative result does not preclude SARS-Cov-2 infection and should not be used as the sole basis for treatment or other patient management decisions. A negative result may occur with  improper specimen collection/handling, submission of specimen other than nasopharyngeal swab, presence of viral mutation(s) within the areas targeted by this assay, and inadequate number of viral copies(<138 copies/mL). A negative result must be combined with clinical observations, patient history, and epidemiological information. The expected result is Negative.  Fact Sheet for Patients:  BloggerCourse.com  Fact Sheet for Healthcare Providers:  SeriousBroker.it  This test is no t yet approved or cleared by the [place]  FDA and  has been  authorized for detection and/or diagnosis of SARS-CoV-2 by FDA under an Emergency Use Authorization (EUA). This  EUA will remain  in effect (meaning this test can be used) for the duration of the COVID-19 declaration under Section 564(b)(1) of the Act, 21 U.S.C.section 360bbb-3(b)(1), unless the authorization is terminated  or revoked sooner.       Influenza A by PCR NEGATIVE NEGATIVE Final   Influenza B by PCR NEGATIVE NEGATIVE Final    Comment: (NOTE) The Xpert Xpress SARS-CoV-2/FLU/RSV plus assay is intended as an aid in the diagnosis of influenza from Nasopharyngeal swab specimens and should not be used as a sole basis for treatment. Nasal washings and aspirates are unacceptable for Xpert Xpress SARS-CoV-2/FLU/RSV testing.  Fact Sheet for Patients: BloggerCourse.com  Fact Sheet for Healthcare Providers: SeriousBroker.it  This test is not yet approved or cleared by the [place]  FDA and has been authorized for detection and/or diagnosis of SARS-CoV-2 by FDA under an Emergency Use Authorization (EUA). This EUA will remain in effect (meaning this test can be used) for the duration of the COVID-19 declaration under Section 564(b)(1) of the Act, 21 U.S.C. section 360bbb-3(b)(1), unless the authorization is terminated or revoked.     Resp Syncytial Virus by PCR NEGATIVE NEGATIVE Final    Comment: (NOTE) Fact Sheet for Patients: BloggerCourse.com  Fact Sheet for Healthcare Providers: SeriousBroker.it  This test is not yet approved or cleared by the [place]  FDA and has been authorized for detection and/or diagnosis of SARS-CoV-2 by FDA under an Emergency Use Authorization (EUA). This EUA will remain in effect (meaning this test can be used) for the duration of the COVID-19 declaration under Section 564(b)(1) of the Act, 21 U.S.C. section 360bbb-3(b)(1), unless the  authorization is terminated or revoked.  Performed at [facility], [address] [place], [place] [address]   Blood culture (routine x 2)     Status: None   Collection Time: 04/13/24 12:48 PM   Specimen: BLOOD  Result Value Ref Range Status   Specimen Description BLOOD BLOOD LEFT ARM  Final   Special Requests   Final    BOTTLES DRAWN AEROBIC AND ANAEROBIC Blood Culture results may not be optimal due to an inadequate volume of blood received in culture bottles   Culture   Final    NO GROWTH 5 DAYS Performed at [facility], [address], [place], [place] [address]    Report Status 04/18/2024 FINAL  Final  Blood culture (routine x 2)     Status: Abnormal   Collection Time: 04/13/24  1:19 PM   Specimen: BLOOD  Result Value Ref Range Status   Specimen Description   Final    BLOOD BLOOD LEFT FOREARM Performed at [facility], [address], [place], [place] [address]    Special Requests   Final    BOTTLES DRAWN AEROBIC AND ANAEROBIC Blood Culture adequate volume Performed at [facility], [address] [place], [place] [address]    Culture  Setup Time   Final    GRAM POSITIVE COCCI AEROBIC BOTTLE ONLY Organism ID to follow CRITICAL RESULT CALLED TO, READ BACK BY AND VERIFIED WITH: NATHAN PARK 1703 04/14/24 MU Performed at [facility], [address] [place], [place] [address]    Culture (A)  Final    STAPHYLOCOCCUS CAPRAE THE SIGNIFICANCE OF ISOLATING THIS ORGANISM FROM A SINGLE SET OF BLOOD CULTURES WHEN MULTIPLE SETS ARE DRAWN IS UNCERTAIN. PLEASE NOTIFY THE MICROBIOLOGY DEPARTMENT WITHIN ONE WEEK IF SPECIATION AND SENSITIVITIES ARE REQUIRED. Performed at [facility], [address] [place],  [place] [address]    Report Status 04/17/2024 FINAL  Final  Blood Culture ID Panel (Reflexed)     Status: Abnormal   Collection Time: 04/13/24  1:19 PM  Result Value Ref Range Status   Enterococcus faecalis NOT DETECTED  NOT DETECTED Final   Enterococcus Faecium NOT DETECTED NOT DETECTED Final   Listeria monocytogenes NOT DETECTED NOT DETECTED Final   Staphylococcus species DETECTED (A) NOT DETECTED Final    Comment: CRITICAL RESULT CALLED TO, READ BACK BY AND VERIFIED WITH: NATHAN PARK 1703 04/14/24 MU    Staphylococcus aureus (BCID) NOT DETECTED NOT DETECTED Final   Staphylococcus epidermidis NOT DETECTED NOT DETECTED Final   Staphylococcus lugdunensis NOT DETECTED NOT DETECTED Final   Streptococcus species NOT DETECTED NOT DETECTED Final   Streptococcus agalactiae NOT DETECTED NOT DETECTED Final   Streptococcus pneumoniae NOT DETECTED NOT DETECTED Final   Streptococcus pyogenes NOT DETECTED NOT DETECTED Final   A.calcoaceticus-baumannii NOT DETECTED NOT DETECTED Final   Bacteroides fragilis NOT DETECTED NOT DETECTED Final   Enterobacterales NOT DETECTED NOT DETECTED Final   Enterobacter cloacae complex NOT DETECTED NOT DETECTED Final   Escherichia coli NOT DETECTED NOT DETECTED Final   Klebsiella aerogenes NOT DETECTED NOT DETECTED Final   Klebsiella oxytoca NOT DETECTED NOT DETECTED Final   Klebsiella pneumoniae NOT DETECTED NOT DETECTED Final   Proteus species NOT DETECTED NOT DETECTED Final   Salmonella species NOT DETECTED NOT DETECTED Final   Serratia marcescens NOT DETECTED NOT DETECTED Final   Haemophilus influenzae NOT DETECTED NOT DETECTED Final   Neisseria meningitidis NOT DETECTED NOT DETECTED Final   Pseudomonas aeruginosa NOT DETECTED NOT DETECTED Final   Stenotrophomonas maltophilia NOT DETECTED NOT DETECTED Final   Candida albicans NOT DETECTED NOT DETECTED Final   Candida auris NOT DETECTED NOT DETECTED Final   Candida glabrata NOT DETECTED NOT DETECTED Final   Candida krusei NOT DETECTED NOT DETECTED Final   Candida parapsilosis NOT DETECTED NOT DETECTED Final   Candida tropicalis NOT DETECTED NOT DETECTED Final   Cryptococcus neoformans/gattii NOT DETECTED NOT DETECTED Final     Comment: Performed at [facility], [address] [place], [place] [address]  Respiratory (~20 pathogens) panel by PCR     Status: None   Collection Time: 04/14/24  3:01 PM   Specimen: Nasopharyngeal Swab; Respiratory  Result Value Ref [place] Status   Adenovirus NOT DETECTED NOT DETECTED Final   Coronavirus 229E NOT DETECTED NOT DETECTED Final    Comment: (NOTE) The Coronavirus on the Respiratory Panel, DOES NOT test for the novel  Coronavirus (2019 nCoV)    Coronavirus HKU1 NOT DETECTED NOT DETECTED Final   Coronavirus NL63 NOT DETECTED NOT DETECTED Final   Coronavirus OC43 NOT DETECTED NOT DETECTED Final   Metapneumovirus NOT DETECTED NOT DETECTED Final   Rhinovirus / Enterovirus NOT DETECTED NOT DETECTED Final   Influenza A NOT DETECTED NOT DETECTED Final   Influenza B NOT DETECTED NOT DETECTED Final   Parainfluenza Virus 1 NOT DETECTED NOT DETECTED Final   Parainfluenza Virus 2 NOT DETECTED NOT DETECTED Final   Parainfluenza Virus 3 NOT DETECTED NOT DETECTED Final   Parainfluenza Virus 4 NOT DETECTED NOT DETECTED Final   Respiratory Syncytial Virus NOT DETECTED NOT DETECTED Final   Bordetella pertussis NOT DETECTED NOT DETECTED Final   Bordetella Parapertussis NOT DETECTED NOT DETECTED Final   Chlamydophila pneumoniae NOT DETECTED NOT DETECTED Final   Mycoplasma pneumoniae NOT DETECTED NOT DETECTED Final    Comment: Performed at [facility], [address] [place], [place] [address]  MRSA Next Gen by PCR, Nasal     Status: None   Collection Time: 04/14/24  7:23 PM  Result Value Ref [place] Status   MRSA by PCR Next Gen NOT DETECTED NOT DETECTED Final    Comment: (NOTE) The GeneXpert MRSA Assay (FDA approved for NASAL specimens only), is one component of a comprehensive MRSA colonization surveillance program. It is not intended to diagnose MRSA infection nor to guide or monitor treatment for MRSA infections. Test performance is not FDA approved in patients  less than 51 years old. Performed at [facility], [address] [place], [place] [address]   Culture, blood (Routine X 2) w Reflex to ID Panel     Status: None (Preliminary result)   Collection Time: 04/17/24 10:02 AM   Specimen: BLOOD  Result Value Ref [place] Status   Specimen Description BLOOD BLOOD RIGHT FOREARM  Final   Special Requests   Final    BOTTLES DRAWN AEROBIC AND ANAEROBIC Blood Culture results may not be optimal due to an inadequate volume of blood received in culture bottles   Culture   Final    NO GROWTH < 24 HOURS Performed at [facility], [address] [place], [place] [address]    Report Status PENDING  Incomplete  Culture, blood (Routine X 2) w Reflex to ID Panel     Status: None (Preliminary result)   Collection Time: 04/17/24 10:02 AM   Specimen: BLOOD  Result Value Ref [place] Status   Specimen Description BLOOD BLOOD RIGHT HAND  Final   Special Requests   Final    BOTTLES DRAWN AEROBIC ONLY Blood Culture results may not be optimal due to an inadequate volume of blood received in culture bottles   Culture   Final    NO GROWTH < 24 HOURS Performed at [facility], [address] [place], [place] [address]    Report Status PENDING  Incomplete     Labs: CBC: Recent Labs  Lab 04/13/24 1248 04/14/24 0224 04/15/24 0420 04/16/24 0618 04/17/24 0321 04/18/24 0601  WBC 28.4* 18.4* 32.4* 13.6* 12.1* 15.8*  NEUTROABS 25.9*  --   --   --   --   --   HGB 11.5* 10.0* 9.8* 11.0* 9.3* 10.1*  HCT 33.9* 30.0* 29.8* 34.1* 29.4* 31.1*  MCV 100.3* 101.4* 102.1* 104.3* 105.4* 103.0*  PLT 368 318 382 383 345 362   Basic Metabolic Panel: Recent Labs  Lab 04/14/24 0224 04/15/24 0420 04/16/24 0618 04/17/24 0321 04/18/24 0601  NA 129* 123* 132* 132* 135  K 4.3 5.1 5.4* 4.3 4.5  CL 91* 88* 94* 93* 91*  CO2 27 27 30  32 35*  GLUCOSE 187* 115* 122* 73 74  BUN 21 37* 44* 36* 35*  CREATININE 1.16* 1.26* 1.00 1.02* 1.07*  CALCIUM  9.2 9.0 9.1 8.5* 8.6*  MG 1.7 2.0 2.1 1.6* 1.9  PHOS 2.4* 3.3 2.7 2.3* 3.4   Liver Function Tests: Recent Labs  Lab 04/13/24 1248  AST 31  ALT 13  ALKPHOS 137*  BILITOT 0.9  PROT 8.2*  ALBUMIN 3.4*   No results for input(s): LIPASE, AMYLASE in the last 168 hours. No results for input(s): AMMONIA in the last 168 hours. Cardiac Enzymes: No results for input(s): CKTOTAL, CKMB, CKMBINDEX, TROPONINI in the last 168 hours. BNP (last 3 results) Recent Labs    04/13/24 1248  BNP 127.3*   CBG: No results for  input(s): GLUCAP in the last 168 hours.  Time spent: 35 minutes  Signed:  Elvan Sor  [facility] 04/18/2024 10:50 AM

## 2024-04-18 NOTE — Progress Notes (Signed)
 PULMONOLOGY         Date: 04/18/2024,   MRN# [id number] LARENE ASCENCIO 31-Aug-1955     AdmissionWeight: 49.4 kg                 CurrentWeight: 49.4 kg  Referring provider: Dr Von   CHIEF COMPLAINT:   Acute exacerbation of COPD   HISTORY OF PRESENT ILLNESS   Is a 68 year old female with a history of asthma and COPD overlap syndrome, history of right sided breast cancer, endometriosis with hysterectomy, chronic hep C with Polly substance abuse history, essential hypertension, peripheral neuropathy, chronic use of aromatase inhibitor therapy and chronic hypoxemia utilizing 4 to 5 L/min of supplemental oxygen  at baseline.  She reports having few days of worsening respiratory symptoms with cough and in the field on arrival was noted to be hypoxemic at 72% with respiratory distress and accessory muscle use.  She was placed on BiPAP support with subsequent improvement however continues to show signs of distress..  Chest x-ray performed shows acute on chronic changes with history of breast cancer and cavitary lung disease as well as advanced COPD.  PCCM consultation placed for additional evaluation and management of patient with complex chronic lung disease with acute on chronic hypoxemic respiratory failure and respiratory distress requiring NIV support.  04/15/24- patient s/p CT chest with progressive cavitary LUL lesion and saccular bronchiectasis.  She has not had bronchoscopy in the past and we discussed doing this on outpatient basis and she is agreeable to this. We weaned steroids to 40mg  IV BID. She remains on zithromax  and rocephin . She is improving and we can plan for dc in 48hr.   04/16/24- patient clinically doing well, reduced steroids to 40 iv daily and changed abx to PO regimen. Kidney function is improved to normal with GFR >60  Plan to dc  in am.  04/17/24-patient with abnormal blood cultures with staph species, not sure the significance yet.  Will ask ID for  clarification if endocarditis workup may be needed.  Dr Luiz was able to do chart review with recommendation for TTE.   04/18/24- patient s/p TTE with reassuring findings.  She may follow up with us  on outpatient basis.  I will see her in clinic within 2 wks for bronchoscopy. She may complete 5 more days of doxy bid 100mg  and Pred 50 with taper by 5mg  per day to her home dose of 5mg  daily.  PAST MEDICAL HISTORY   Past Medical History:  Diagnosis Date   Abnormal CT scan, chest    Anxiety    Asthma    Breast cancer (HCC)    Breast CA- Right    Breast cancer (HCC) 06/18/2011   right breast cancer   COPD (chronic obstructive pulmonary disease) (HCC)    Endometriosis    tx with vaginal hysterectomy   Fatty liver    Hepatitis C 1998   [facility] trial treatment in-patient study 2005. HCV 740, 05/20/11.   History of substance abuse (HCC)    Heroine, cocaine, marijuana. States all of them. Heavy use 1995 to 2000. Occasional use before and after. None since 2006.    Hot flashes related to aromatase inhibitor therapy    Hypertension    Lung mass    Peripheral neuropathy    in hands and feet     SURGICAL HISTORY   Past Surgical History:  Procedure Laterality Date   BREAST LUMPECTOMY Right 2013   COLONOSCOPY WITH PROPOFOL  N/A 03/20/2022   Procedure:  COLONOSCOPY WITH PROPOFOL ;  Surgeon: Unk Corinn Skiff, MD;  Location: [facility] ENDOSCOPY;  Service: Gastroenterology;  Laterality: N/A;   ESOPHAGOGASTRODUODENOSCOPY (EGD) WITH PROPOFOL  N/A 10/29/2021   Procedure: ESOPHAGOGASTRODUODENOSCOPY (EGD) WITH PROPOFOL ;  Surgeon: Unk Corinn Skiff, MD;  Location: [facility] ENDOSCOPY;  Service: Gastroenterology;  Laterality: N/A;   ESOPHAGOGASTRODUODENOSCOPY (EGD) WITH PROPOFOL  N/A 02/14/2022   Procedure: ESOPHAGOGASTRODUODENOSCOPY (EGD) WITH PROPOFOL ;  Surgeon: Unk Corinn Skiff, MD;  Location: [facility] ENDOSCOPY;  Service: Gastroenterology;  Laterality: N/A;  DRIVER 5 - 10 MINUTES AWAY   INTRAMEDULLARY (IM) NAIL  INTERTROCHANTERIC Left 04/26/2023   Procedure: INTRAMEDULLARY (IM) NAIL INTERTROCHANTERIC;  Surgeon: Cleotilde Barrio, MD;  Location: [facility] ORS;  Service: Orthopedics;  Laterality: Left;   TUBAL LIGATION  1996   Upper right leg benign tumor removed     VAGINAL HYSTERECTOMY  2001     FAMILY HISTORY   Family History  Problem Relation Age of Onset   Diabetes Maternal Grandmother    Lung cancer Father    Lung cancer Mother    Hypertension Mother    Heart disease Mother    Skin cancer Mother    Breast cancer Neg Hx      SOCIAL HISTORY   Social History   Tobacco Use   Smoking status: Every Day    Types: E-cigarettes   Smokeless tobacco: Never   Tobacco comments:    last cigeratte use 6 days ago (11/30/15)  Vaping Use   Vaping status: Every Day  Substance Use Topics   Alcohol use: Yes    Comment: occ   Drug use: No     MEDICATIONS    Home Medication:     Current Medication:  Current Facility-Administered Medications:    acetaminophen  (TYLENOL ) tablet 650 mg, 650 mg, Oral, Q6H PRN, 650 mg at 04/17/24 0928 **OR** acetaminophen  (TYLENOL ) suppository 650 mg, 650 mg, Rectal, Q6H PRN, Fausto Sor A, DO   amLODipine (NORVASC) tablet 5 mg, 5 mg, Oral, QPM, Von, Dileep, MD, 5 mg at 04/17/24 1626   ascorbic acid (VITAMIN C) tablet 500 mg, 500 mg, Oral, Daily, Von Bellis, MD, 500 mg at 04/17/24 9071   budesonide -glycopyrrolate -formoterol (BREZTRI) 160-9-4.8 MCG/ACT inhaler 2 puff, 2 puff, Inhalation, BID, Fausto Sor A, DO, 2 puff at 04/17/24 2125   cyanocobalamin  (VITAMIN B12) tablet 500 mcg, 500 mcg, Oral, Daily, Kumar, Dileep, MD, 500 mcg at 04/17/24 0929   doxycycline (VIBRA-TABS) tablet 100 mg, 100 mg, Oral, Q12H, Zelma Mazariego, MD, 100 mg at 04/17/24 2051   DULoxetine  (CYMBALTA ) DR capsule 30 mg, 30 mg, Oral, BID, Fausto Sor A, DO, 30 mg at 04/17/24 2051   enoxaparin  (LOVENOX ) injection 40 mg, 40 mg, Subcutaneous, Q24H, Fausto Sor A, DO, 40 mg at  04/17/24 2144   feeding supplement (ENSURE ENLIVE / ENSURE PLUS) liquid 237 mL, 237 mL, Oral, BID BM, Fausto Sor A, DO, 237 mL at 04/16/24 1638   folic acid (FOLVITE) tablet 1 mg, 1 mg, Oral, Daily, Von Bellis, MD, 1 mg at 04/17/24 9070   furosemide  (LASIX ) tablet 20 mg, 20 mg, Oral, Daily, Von Bellis, MD, 20 mg at 04/17/24 9070   gabapentin  (NEURONTIN ) capsule 300 mg, 300 mg, Oral, TID, Fausto Sor A, DO, 300 mg at 04/17/24 2051   guaiFENesin  (MUCINEX ) 12 hr tablet 600 mg, 600 mg, Oral, BID, Fausto Sor A, DO, 600 mg at 04/17/24 2051   hydrocortisone  cream 1 %, , Topical, TID PRN, Von Bellis, MD   ipratropium-albuterol  (DUONEB) 0.5-2.5 (3) MG/3ML nebulizer solution 3 mL, 3 mL,  Nebulization, BID, Von Bellis, MD, 3 mL at 04/18/24 0732   iron  polysaccharides (NIFEREX) capsule 150 mg, 150 mg, Oral, Daily, Von Bellis, MD, 150 mg at 04/17/24 0930   lamoTRIgine  (LAMICTAL ) tablet 100 mg, 100 mg, Oral, Daily, Fausto Burnard LABOR, DO, 100 mg at 04/17/24 9070   levalbuterol (XOPENEX) nebulizer solution 0.63 mg, 0.63 mg, Nebulization, Q3H PRN, Fausto Burnard LABOR, DO, 0.63 mg at 04/17/24 2341   levothyroxine (SYNTHROID) tablet 50 mcg, 50 mcg, Oral, QAC breakfast, Fausto Burnard LABOR, DO, 50 mcg at 04/18/24 9393   methylPREDNISolone  sodium succinate  (SOLU-MEDROL ) 40 mg/mL injection 40 mg, 40 mg, Intravenous, Daily, Alyas Creary, MD, 40 mg at 04/17/24 9070   metoprolol  succinate (TOPROL -XL) 24 hr tablet 100 mg, 100 mg, Oral, Daily, Fausto Burnard A, DO, 100 mg at 04/17/24 9070   ondansetron  (ZOFRAN ) tablet 4 mg, 4 mg, Oral, Q6H PRN **OR** ondansetron  (ZOFRAN ) injection 4 mg, 4 mg, Intravenous, Q6H PRN, Fausto Burnard A, DO   polyethylene glycol (MIRALAX  / GLYCOLAX ) packet 17 g, 17 g, Oral, Daily PRN, Fausto Burnard A, DO   QUEtiapine  (SEROQUEL ) tablet 25 mg, 25 mg, Oral, QHS PRN, Fausto Burnard LABOR, DO, 25 mg at 04/17/24 2051   sorbitol 70 % solution 30 mL, 30 mL, Oral, Daily PRN,  Fausto Burnard A, DO   Vitamin D (Ergocalciferol) (DRISDOL) 1.25 MG (50000 UNIT) capsule 50,000 Units, 50,000 Units, Oral, Q7 days, Von Bellis, MD, 50,000 Units at 04/15/24 1209    ALLERGIES   Penicillins     REVIEW OF SYSTEMS    Review of Systems:  Gen:  Denies  fever, sweats, chills weigh loss  HEENT: Denies blurred vision, double vision, ear pain, eye pain, hearing loss, nose bleeds, sore throat Cardiac:  No dizziness, chest pain or heaviness, chest tightness,edema Resp:   reports dyspnea chronically  Gi: Denies swallowing difficulty, stomach pain, nausea or vomiting, diarrhea, constipation, bowel incontinence Gu:  Denies bladder incontinence, burning urine Ext:   Denies Joint pain, stiffness or swelling Skin: Denies  skin rash, easy bruising or bleeding or hives Endoc:  Denies polyuria, polydipsia , polyphagia or weight change Psych:   Denies depression, insomnia or hallucinations   Other:  All other systems negative   VS: BP (!) 174/90 (BP Location: Left Arm)   Pulse 81   Temp (!) 97.3 F (36.3 C) (Oral)   Resp 14   Ht 5' 3 (1.6 m)   Wt 49.4 kg   SpO2 98%   BMI 19.31 kg/m      PHYSICAL EXAM    GENERAL:NAD, no fevers, chills, no weakness no fatigue HEAD: Normocephalic, atraumatic.  EYES: Pupils equal, round, reactive to light. Extraocular muscles intact. No scleral icterus.  MOUTH: Moist mucosal membrane. Dentition intact. No abscess noted.  EAR, NOSE, THROAT: Clear without exudates. No external lesions.  NECK: Supple. No thyromegaly. No nodules. No JVD.  PULMONARY: decreased breath sounds with mild rhonchi worse at bases bilaterally.  CARDIOVASCULAR: S1 and S2. Regular rate and rhythm. No murmurs, rubs, or gallops. No edema. Pedal pulses 2+ bilaterally.  GASTROINTESTINAL: Soft, nontender, nondistended. No masses. Positive bowel sounds. No hepatosplenomegaly.  MUSCULOSKELETAL: No swelling, clubbing, or edema. Range of motion full in all extremities.   NEUROLOGIC: Cranial nerves II through XII are intact. No gross focal neurological deficits. Sensation intact. Reflexes intact.  SKIN: No ulceration, lesions, rashes, or cyanosis. Skin warm and dry. Turgor intact.  PSYCHIATRIC: Mood, affect within normal limits. The patient is awake, alert and oriented x 3. Insight,  judgment intact.       IMAGING   CLINICAL DATA:  Severe shortness of breath.   EXAM: PORTABLE CHEST 1 VIEW   COMPARISON:  Radiograph and CT 04/25/2023   FINDINGS: Multifocal pulmonary opacities in the right mid lower lung zone suspicious for pneumonia. Background emphysema with chronic scarring and bulla in the lateral left upper lobe. Mediastinal contours are distorted due due to scoliosis. The heart is normal in size with aortic atherosclerosis and tortuosity. No significant pleural effusion. Thoracic scoliosis.   IMPRESSION: 1. Multifocal pulmonary opacities in the right mid lower lung zone suspicious for pneumonia. 2. Background emphysema with chronic scarring and bulla in the lateral left upper lobe.     Electronically Signed   By: Andrea Gasman M.D.   On: 04/13/2024 14:10     Contains abnormal data Blood Culture ID Panel (Reflexed) Order: [id number]  Status: Final result     Next appt: None   Test Result Released: Yes (not seen)   0 Result Notes    Component Ref Range & Units (hover) 4 d ago  Enterococcus faecalis NOT DETECTED  Enterococcus Faecium NOT DETECTED  Listeria monocytogenes NOT DETECTED  Staphylococcus species DETECTED Abnormal   Comment: CRITICAL RESULT CALLED TO, READ BACK BY AND VERIFIED WITH: NATHAN PARK 1703 04/14/24 MU  Staphylococcus aureus (BCID) NOT DETECTED  Staphylococcus epidermidis NOT DETECTED  Staphylococcus lugdunensis NOT DETECTED  Streptococcus species NOT DETECTED  Streptococcus agalactiae NOT DETECTED  Streptococcus pneumoniae NOT DETECTED  Streptococcus pyogenes NOT DETECTED  A.calcoaceticus-baumannii NOT  DETECTED  Bacteroides fragilis NOT DETECTED  Enterobacterales NOT DETECTED  Enterobacter cloacae complex NOT DETECTED  Escherichia coli NOT DETECTED  Klebsiella aerogenes NOT DETECTED  Klebsiella oxytoca NOT DETECTED  Klebsiella pneumoniae NOT DETECTED  Proteus species NOT DETECTED  Salmonella species NOT DETECTED  Serratia marcescens NOT DETECTED  Haemophilus influenzae NOT DETECTED  Neisseria meningitidis NOT DETECTED  Pseudomonas aeruginosa NOT DETECTED  Stenotrophomonas maltophilia NOT DETECTED  Candida albicans NOT DETECTED  Candida auris NOT DETECTED  Candida glabrata NOT DETECTED      ASSESSMENT/PLAN     Community acquired pneumonia    - RVP- negative    - procalcitonin trend    - CRP - 10    - empiric therapy with zithromax  and rocephin  IV    - MRSA prcr     - AFB resp cultures     - fungitell    - legionella -negative    - strep pneumoniae ur ag-negative    - histoplasma ur ag    - gram stain and culture sputum     -there is mild AKI precluding CT chest with IV contrast , will give LR and consider in am to perform contrasted CT chest     Acute exacerbation of COPD - moderate  -continue nebulizer therapy with xopenex -agree with steroids - currently at 60mg  BID solumedrol -Incentive spirometry  -PT/OT    AKI - KDIGO 2 -suspect this is due to ischemia with acute on chronic hypoxemia -will rehydrate with LR and recheck BMP in am -hopefully we can get CT chest with contrast after renal indices are within reference range.   Thank you for allowing me to participate in the care of this patient.   Patient/Family are satisfied with care plan and all questions have been answered.    Provider disclosure: Patient with at least one acute or chronic illness or injury that poses a threat to life or bodily function and is being managed actively  during this encounter.  All of the below services have been performed independently by signing provider:  review of  prior documentation from internal and or external health records.  Review of previous and current lab results.  Interview and comprehensive assessment during patient visit today. Review of current and previous chest radiographs/CT scans. Discussion of management and test interpretation with health care team and patient/family.   This document was prepared using Dragon voice recognition software and may include unintentional dictation errors.     Manie Bealer, M.D.  Division of Pulmonary & Critical Care Medicine

## 2024-04-20 LAB — FUNGITELL BETA-D-GLUCAN: Fungitell Value:: <31.25 pg/mL

## 2024-04-22 LAB — CULTURE, BLOOD (ROUTINE X 2)
Culture: NO GROWTH
Culture: NO GROWTH

## 2024-04-29 ENCOUNTER — Other Ambulatory Visit: Payer: Self-pay | Admitting: Pulmonary Disease

## 2024-05-03 ENCOUNTER — Other Ambulatory Visit: Payer: Self-pay | Admitting: Pulmonary Disease

## 2024-05-03 DIAGNOSIS — J449 Chronic obstructive pulmonary disease, unspecified: Secondary | ICD-10-CM

## 2024-05-03 DIAGNOSIS — J984 Other disorders of lung: Secondary | ICD-10-CM

## 2024-05-05 ENCOUNTER — Other Ambulatory Visit: Payer: Self-pay

## 2024-05-05 ENCOUNTER — Ambulatory Visit
Admission: RE | Admit: 2024-05-05 | Discharge: 2024-05-05 | Disposition: A | Discharge status: Home / Self Care | Source: Ambulatory Visit | Attending: Pulmonary Disease | Admitting: Pulmonary Disease

## 2024-05-05 ENCOUNTER — Encounter
Admission: RE | Admit: 2024-05-05 | Discharge: 2024-05-05 | Disposition: A | Discharge status: Home / Self Care | Source: Ambulatory Visit | Attending: Pulmonary Disease | Admitting: Pulmonary Disease

## 2024-05-05 DIAGNOSIS — J449 Chronic obstructive pulmonary disease, unspecified: Secondary | ICD-10-CM | POA: Insufficient documentation

## 2024-05-05 DIAGNOSIS — J984 Other disorders of lung: Secondary | ICD-10-CM | POA: Diagnosis present

## 2024-05-05 HISTORY — DX: Personal history of other infectious and parasitic diseases: Z86.19

## 2024-05-05 HISTORY — DX: Depression, unspecified: F32.A

## 2024-05-05 HISTORY — DX: Ulcer of intestine: K63.3

## 2024-05-05 NOTE — Patient Instructions (Addendum)
 Your procedure is scheduled on:   FRIDAY  OCTOBER 31 Report to the Registration Desk on the 1st floor of the [facility] To find out your arrival time, please call [phone] between 1PM - 3PM on:  THURSDAY  OCTOBER 30  If your arrival time is 6:00 am, do not arrive before that time as the [facility] entrance doors do not open until 6:00 am.  REMEMBER: Instructions that are not followed completely may result in serious medical risk, up to and including death; or upon the discretion of your surgeon and anesthesiologist your surgery may need to be rescheduled.  Do not eat food after midnight the night before surgery.  No gum chewing or hard candies.  You may however, drink CLEAR liquids up to 2 hours before you are scheduled to arrive for your surgery. Do not drink anything within 2 hours of your scheduled arrival time.  Clear liquids include: - water  - apple juice without pulp - gatorade (not RED colors) - black coffee or tea (Do NOT add milk or creamers to the coffee or tea) Do NOT drink anything that is not on this list.   In addition, your doctor has ordered for you to drink the provided:  Ensure Pre-Surgery Clear Carbohydrate Drink   Drinking this carbohydrate drink up to two hours before surgery helps to reduce insulin  resistance and improve patient outcomes. Please complete drinking 2 hours before scheduled arrival time.  One week prior to surgery: Stop Anti-inflammatories (NSAIDS) such as Advil, Aleve, Ibuprofen, Motrin, Naproxen, Naprosyn and Aspirin based products such as Excedrin, Goody's Powder, BC Powder. Stop ANY OVER THE COUNTER supplements until after surgery. ascorbic acid (VITAMIN C)  cyanocobalamin   iron  polysaccharides (NIFEREX)  folic acid (FOLVITE)  Vitamin D, Ergocalciferol, (DRISDOL)   You may however, continue to take Tylenol  if needed for pain up until the day of surgery.  Continue taking all of your other prescription medications up until the day  of surgery.  ON THE DAY OF SURGERY ONLY TAKE THESE MEDICATIONS WITH SIPS OF WATER:  DULoxetine  (CYMBALTA )  levothyroxine (SYNTHROID)  metoprolol  succinate (TOPROL -XL)  omeprazole  (PRILOSEC )  predniSONE  (DELTASONE )   Use inhalers on the day of surgery and bring to the hospital. albuterol  (PROVENTIL )  albuterol  (VENTOLIN  HFA)  TRELEGY ELLIPTA   No Alcohol for 24 hours before or after surgery.  No Smoking including e-cigarettes for 24 hours before surgery.  No chewable tobacco products for at least 6 hours before surgery.  No nicotine  patches on the day of surgery.  Do not use any recreational drugs for at least a week (preferably 2 weeks) before your surgery.  Please be advised that the combination of cocaine and anesthesia may have negative outcomes, up to and including death. If you test positive for cocaine, your surgery will be cancelled.  On the morning of surgery brush your teeth with toothpaste and water, you may rinse your mouth with mouthwash if you wish. Do not swallow any toothpaste or mouthwash.  Do not wear jewelry, make-up, hairpins, clips or nail polish.  For welded (permanent) jewelry: bracelets, anklets, waist bands, etc.  Please have this removed prior to surgery.  If it is not removed, there is a chance that hospital personnel will need to cut it off on the day of surgery.  Do not wear lotions, powders, or perfumes.   Do not shave body hair from the neck down 48 hours before surgery.  Contact lenses, hearing aids and dentures may not be worn  into surgery.  Do not bring valuables to the hospital. [facility] is not responsible for any missing/lost belongings or valuables.   Notify your doctor if there is any change in your medical condition (cold, fever, infection).  Wear comfortable clothing (specific to your surgery type) to the hospital.  After surgery, you can help prevent lung complications by doing breathing exercises.  Take deep breaths and cough  every 1-2 hours.   If you are being discharged the day of surgery, you will not be allowed to drive home. You will need a responsible individual to drive you home and stay with you for 24 hours after surgery.   If you are taking public transportation, you will need to have a responsible individual with you.  Please call the Pre-admissions Testing Dept. at [phone] if you have any questions about these instructions.  Surgery Visitation Policy:  Patients having surgery or a procedure may have two visitors.  Children under the age of 2 must have an adult with them who is not the patient.  Merchandiser, Retail to address health-related social needs:  [email or web address]

## 2024-05-07 ENCOUNTER — Ambulatory Visit

## 2024-05-07 ENCOUNTER — Encounter: Admission: RE | Disposition: A | Payer: Self-pay | Source: Home / Self Care | Attending: Pulmonary Disease

## 2024-05-07 ENCOUNTER — Ambulatory Visit: Admitting: Urgent Care

## 2024-05-07 ENCOUNTER — Other Ambulatory Visit: Payer: Self-pay

## 2024-05-07 ENCOUNTER — Encounter: Payer: Self-pay | Admitting: Pulmonary Disease

## 2024-05-07 ENCOUNTER — Ambulatory Visit
Admission: RE | Admit: 2024-05-07 | Discharge: 2024-05-07 | Disposition: A | Discharge status: Home / Self Care | Attending: Pulmonary Disease | Admitting: Pulmonary Disease

## 2024-05-07 DIAGNOSIS — G894 Chronic pain syndrome: Secondary | ICD-10-CM | POA: Diagnosis not present

## 2024-05-07 DIAGNOSIS — Z853 Personal history of malignant neoplasm of breast: Secondary | ICD-10-CM | POA: Insufficient documentation

## 2024-05-07 DIAGNOSIS — F32A Depression, unspecified: Secondary | ICD-10-CM | POA: Insufficient documentation

## 2024-05-07 DIAGNOSIS — F17290 Nicotine dependence, other tobacco product, uncomplicated: Secondary | ICD-10-CM | POA: Insufficient documentation

## 2024-05-07 DIAGNOSIS — N809 Endometriosis, unspecified: Secondary | ICD-10-CM | POA: Diagnosis not present

## 2024-05-07 DIAGNOSIS — R590 Localized enlarged lymph nodes: Secondary | ICD-10-CM | POA: Diagnosis not present

## 2024-05-07 DIAGNOSIS — T17890A Other foreign object in other parts of respiratory tract causing asphyxiation, initial encounter: Secondary | ICD-10-CM | POA: Insufficient documentation

## 2024-05-07 DIAGNOSIS — I700 Atherosclerosis of aorta: Secondary | ICD-10-CM | POA: Diagnosis not present

## 2024-05-07 DIAGNOSIS — J984 Other disorders of lung: Secondary | ICD-10-CM | POA: Diagnosis present

## 2024-05-07 DIAGNOSIS — Z9981 Dependence on supplemental oxygen: Secondary | ICD-10-CM | POA: Diagnosis not present

## 2024-05-07 DIAGNOSIS — X58XXXA Exposure to other specified factors, initial encounter: Secondary | ICD-10-CM | POA: Insufficient documentation

## 2024-05-07 DIAGNOSIS — I10 Essential (primary) hypertension: Secondary | ICD-10-CM | POA: Diagnosis not present

## 2024-05-07 DIAGNOSIS — F419 Anxiety disorder, unspecified: Secondary | ICD-10-CM | POA: Insufficient documentation

## 2024-05-07 DIAGNOSIS — J439 Emphysema, unspecified: Secondary | ICD-10-CM | POA: Diagnosis not present

## 2024-05-07 HISTORY — PX: VIDEO BRONCHOSCOPY WITH ENDOBRONCHIAL ULTRASOUND: SHX6177

## 2024-05-07 HISTORY — PX: VIDEO BRONCHOSCOPY WITH ENDOBRONCHIAL NAVIGATION: SHX6175

## 2024-05-07 SURGERY — VIDEO BRONCHOSCOPY WITH ENDOBRONCHIAL NAVIGATION
Anesthesia: General

## 2024-05-07 MED ORDER — DEXAMETHASONE SOD PHOSPHATE PF 10 MG/ML IJ SOLN
INTRAMUSCULAR | Status: DC | PRN
Start: 2024-05-07 — End: 2024-05-07
  Administered 2024-05-07: 10 mg via INTRAVENOUS

## 2024-05-07 MED ORDER — PHENYLEPHRINE 80 MCG/ML (10ML) SYRINGE FOR IV PUSH (FOR BLOOD PRESSURE SUPPORT)
PREFILLED_SYRINGE | INTRAVENOUS | Status: DC | PRN
  Administered 2024-05-07: 120 ug via INTRAVENOUS
  Administered 2024-05-07 (×2): 80 ug via INTRAVENOUS

## 2024-05-07 MED ORDER — FENTANYL CITRATE (PF) 100 MCG/2ML IJ SOLN
INTRAMUSCULAR | Status: DC | PRN
  Administered 2024-05-07: 50 ug via INTRAVENOUS
  Administered 2024-05-07: 25 ug via INTRAVENOUS

## 2024-05-07 MED ORDER — FENTANYL CITRATE (PF) 100 MCG/2ML IJ SOLN: 25.0000 ug | INTRAMUSCULAR | Status: DC | PRN

## 2024-05-07 MED ORDER — EPHEDRINE 5 MG/ML INJ
INTRAVENOUS | Status: AC
  Filled 2024-05-07: qty 5

## 2024-05-07 MED ORDER — SUGAMMADEX SODIUM 200 MG/2ML IV SOLN
INTRAVENOUS | Status: DC | PRN
Start: 2024-05-07 — End: 2024-05-07
  Administered 2024-05-07: 98.8 mg via INTRAVENOUS

## 2024-05-07 MED ORDER — EPHEDRINE SULFATE-NACL 50-0.9 MG/10ML-% IV SOSY
PREFILLED_SYRINGE | INTRAVENOUS | Status: DC | PRN
Start: 2024-05-07 — End: 2024-05-07
  Administered 2024-05-07: 5 mg via INTRAVENOUS
  Administered 2024-05-07: 7.5 mg via INTRAVENOUS

## 2024-05-07 MED ORDER — PHENYLEPHRINE 80 MCG/ML (10ML) SYRINGE FOR IV PUSH (FOR BLOOD PRESSURE SUPPORT)
PREFILLED_SYRINGE | INTRAVENOUS | Status: AC
  Filled 2024-05-07: qty 10

## 2024-05-07 MED ORDER — OXYCODONE HCL 5 MG/5ML PO SOLN: 5.0000 mg | Freq: Once | ORAL | Status: DC | PRN

## 2024-05-07 MED ORDER — CHLORHEXIDINE GLUCONATE 0.12 % MT SOLN
15.0000 mL | Freq: Once | OROMUCOSAL | Status: AC
  Administered 2024-05-07: 15 mL via OROMUCOSAL

## 2024-05-07 MED ORDER — ONDANSETRON HCL 4 MG/2ML IJ SOLN
INTRAMUSCULAR | Status: AC
  Filled 2024-05-07: qty 2

## 2024-05-07 MED ORDER — ROCURONIUM BROMIDE 100 MG/10ML IV SOLN
INTRAVENOUS | Status: DC | PRN
  Administered 2024-05-07: 40 mg via INTRAVENOUS

## 2024-05-07 MED ORDER — FENTANYL CITRATE (PF) 100 MCG/2ML IJ SOLN
INTRAMUSCULAR | Status: AC
  Filled 2024-05-07: qty 2

## 2024-05-07 MED ORDER — OXYCODONE HCL 5 MG PO TABS: 5.0000 mg | ORAL_TABLET | Freq: Once | ORAL | Status: DC | PRN

## 2024-05-07 MED ORDER — MIDAZOLAM HCL 2 MG/2ML IJ SOLN
INTRAMUSCULAR | Status: AC
  Filled 2024-05-07: qty 2

## 2024-05-07 MED ORDER — PROPOFOL 10 MG/ML IV BOLUS
INTRAVENOUS | Status: DC | PRN
  Administered 2024-05-07: 100 mg via INTRAVENOUS

## 2024-05-07 MED ORDER — MIDAZOLAM HCL (PF) 2 MG/2ML IJ SOLN
INTRAMUSCULAR | Status: DC | PRN
  Administered 2024-05-07: 2 mg via INTRAVENOUS

## 2024-05-07 MED ORDER — LACTATED RINGERS IV SOLN: INTRAVENOUS | Status: DC

## 2024-05-07 MED ORDER — ROCURONIUM BROMIDE 10 MG/ML (PF) SYRINGE
PREFILLED_SYRINGE | INTRAVENOUS | Status: AC
  Filled 2024-05-07: qty 10

## 2024-05-07 MED ORDER — ONDANSETRON HCL 4 MG/2ML IJ SOLN
INTRAMUSCULAR | Status: DC | PRN
  Administered 2024-05-07: 4 mg via INTRAVENOUS

## 2024-05-07 MED ORDER — CHLORHEXIDINE GLUCONATE 0.12 % MT SOLN
OROMUCOSAL | Status: AC
  Filled 2024-05-07: qty 15

## 2024-05-07 MED ORDER — ORAL CARE MOUTH RINSE: 15.0000 mL | Freq: Once | OROMUCOSAL | Status: AC

## 2024-05-07 MED ORDER — PROPOFOL 10 MG/ML IV BOLUS
INTRAVENOUS | Status: AC
  Filled 2024-05-07: qty 20

## 2024-05-07 NOTE — Procedures (Signed)
 ROBOTIC NAVIGATIONAL BRONCHOSCOPY PROCEDURE NOTE 31627  FIBEROPTIC BRONCHOSCOPY WITH BRONCHOALVEOLAR LAVAGE PROCEDURE NOTE 31624  THERAPEUTIC ASPIRATION OF TRACHEOBRONCHIAL TREE 31645  ENDOBRONCHIAL ULTRASOUND X >/ 1 LYMPH NODE PROCEDURE NOTE 31652 31653  TRANSBRONCHIAL SURGICAL LUNG BIOPSY X 1 LOBE 31628  RADIAL ENDOBRONCHIAL [id number]  TRANSBRONCHIAL CYTOPATHOLOGY BRUSHINGS [id number]    Flexible bronchoscopy was performed  by : Parris MD  assistance by : 1)Repiratory therapist  and 2)cytotech staff and 3) Anesthesia team and 4) Flouroscopy team and 5) IT supporting staff for robotic platform   Indication for the procedure was :  Pre-procedural H&P. The following assessment was performed on the day of the procedure prior to initiating sedation History:  Chest pain n Dyspnea y Hemoptysis n Cough y Fever n Other pertinent items n  Examination Vital signs -reviewed as per nursing documentation today Cardiac    Murmurs: n  Rubs : n  Gallop: n Lungs Wheezing: n Rales : n Rhonchi :y  Other pertinent findings: SOB/hypoxemia due to chronic lung disease   Pre-procedural assessment for Procedural Sedation included: Depth of sedation: As per anesthesia team  ASA Classification:  2 Mallampati airway assessment: 3    Medication list reviewed: y  The patient's interval history was taken and revealed: no new complaints The pre- procedure physical examination revealed: No new findings Refer to prior clinic note for details.  Informed Consent: Informed consent was obtained from:  patient after explanation of procedure and risks, benefits, as well as alternative procedures available.  Explanation of level of sedation and possible transfusion was also provided.    Procedural Preparation: Time out was performed and patient was identified by name and birthdate and procedure to be performed and side for sampling, if any, was specified. Pt was intubated by  anesthesia.  The patient was appropriately draped.   Fiberoptic bronchoscopy with airway inspection, therapeutic aspiration of tracheobronchial tree and BAL Procedure findings:  Bronchoscope was inserted via ETT  without difficulty.  Posterior oropharynx, epiglottis, arytenoids, false cords and vocal cords were not visualized as these were bypassed by endotracheal tube. The distal trachea was normal in circumference and appearance without mucosal, cartilaginous or branching abnormalities.  The main carina was mildly splayed . All right and left lobar airways were NOT visualized to the Subsegmental level due to obstructed airways from phlegm and mucus  plugging. Mucus plugging with partial to complete airway obstruction was noted bilaterally and treated with therapeutic aspiration prior to beginning of robotic bronchoscopy to allow adequate visualization and accurate lung biopsy and improve patients respiratory status.   The mucosa was : friable at target segment of LEFT upper lobe   Airways were notable for:        exophytic lesions :n       extrinsic compression in the following distributions: n.       Friable mucosa: y       Teacher, music /pigmentation: n     Media Information  Heavy phlegm throughout both lungs bilaterally     Media Information  Anthrocotic pigmentation throughout bilateral lungs     Media Information  LESION OF LEFT UPPER LOBE BIOPSIED VIA SURGICAL FORCEPS     Media Information  LARGE CAVITY OF LEFT UPPER LOBE WITH ANTHRACOSIS ON VISCERAL PLEURA   Media Information        Media Information   Post procedure Diagnosis:   Mucus plugging of tracheobronchial tree BILATERALLY     Navigational Bronchoscopy Procedure Findings:   Ion robotic platform utilized for this procedure.  Post appropriate planning and registration peripheral navigation was used to visualize target lesion.   Radial endobronchial US  technology utilized during this  procedure for confirmation of lesion.     LEFT UPPER LOBE CONCENTRIC SIGNAL CONFIRMATION POST REMOVAL OF VISUAL PROBE    Target 1 LEFT UPPER LOBE - Transbronchial cytobrushing x 1  ,     Transbronchial surgical pathology x 3,    BAL was performed at this location and sent for processing with cytology and microbiology  Due to insufficient tissue via FNA a surgical lung biopsy via surgical forceps was required for adequate pathology tissue evaluation  Post procedure diagnosis:  cavitary lesion of LEFT UPPER LOBE      Endobronchial ultrasound assisted hilar and mediastinal lymph node biopsies procedure findings: The fiberoptic bronchoscope was removed and the EBUS scope was introduced. Examination began to evaluate for pathologically enlarged lymph nodes starting on the RIGHT side progressing to the LEFT side.  All lymph node biopsies performed with 21G needle. Lymph node biopsies were sent in cytolite for all stations.  STATION 4R- NOT BIOPSIED STATION 10R - NOT BIOPSIED STATION 7 - 1.4 CM BIOPSIED 4 TIMES STATION 4L - NOT BIOPSIED STATION 10L - NOT BIOPSIED  Post procedure diagnosis:  MEDIASTINAL LYMPHADENOPATHY     Immediate sampling complications included:NONE immediate  Epinephrine  ZERO ml was used topically  The bronchoscopy was terminated due to completion of the planned procedure and the bronchoscope was removed.   Total dosage of Lidocaine  was ZERO mg  Estimated Blood loss: EXPECTED < 5cc.  Complications included:  NONE   Preliminary CXR findings :  IN PROCESS  Disposition: HOME WITH FAMILY   Follow up with Dr. Taye Cato in 5 days for result discussion.     Halina Picking MD  [facility] Division of Pulmonary & Critical Care Medicine

## 2024-05-07 NOTE — Anesthesia Preprocedure Evaluation (Signed)
 Anesthesia Evaluation  Patient identified by MRN, date of birth, ID band Patient awake    Reviewed: Allergy & Precautions, NPO status , Patient's Chart, lab work & pertinent test results  History of Anesthesia Complications Negative for: history of anesthetic complications  Airway Mallampati: III  TM Distance: >3 FB Neck ROM: full    Dental  (+) Chipped   Pulmonary COPD,  COPD inhaler and oxygen  dependent, Current Smoker and Patient abstained from smoking.   Pulmonary exam normal        Cardiovascular hypertension, On Medications negative cardio ROS Normal cardiovascular exam  Echo reviewed    Neuro/Psych  PSYCHIATRIC DISORDERS Anxiety Depression     Neuromuscular disease    GI/Hepatic negative GI ROS, Neg liver ROS,,,  Endo/Other  negative endocrine ROS    Renal/GU Renal disease     Musculoskeletal   Abdominal   Peds  Hematology negative hematology ROS (+)   Anesthesia Other Findings Past Medical History: No date: Abnormal CT scan, chest 06/28/2022: Acute hypoxic respiratory failure (HCC) 06/28/2022: AKI (acute kidney injury) No date: Anxiety 06/28/2022: Aspiration pneumonia (HCC) No date: Asthma No date: Breast cancer (HCC)     Comment:  Breast CA- Right  06/18/2011: Breast cancer (HCC)     Comment:  right breast cancer 04/18/2016: Carcinoma of overlapping sites of right breast in female,  estrogen receptor positive (HCC) 12/24/2022: Chronic pain syndrome 05/01/2023: Chronic respiratory failure with hypoxia (HCC) 04/25/2023: Closed left hip fracture (HCC) 12/24/2022: Compression fracture of L1 lumbar vertebra (HCC) No date: COPD (chronic obstructive pulmonary disease) (HCC) No date: Depression No date: Endometriosis     Comment:  tx with vaginal hysterectomy No date: Fatty liver 12/24/2022: Foraminal stenosis of lumbar region 1998: Hepatitis C     Comment:  [facility] trial treatment in-patient study 2005.  HCV 740,               05/20/11. No date: History of Helicobacter pylori infection No date: History of substance abuse (HCC)     Comment:  Heroine, cocaine, marijuana. States all of them. Heavy               use 1995 to 2000. Occasional use before and after. None               since 2006.  No date: Hot flashes related to aromatase inhibitor therapy No date: Hypertension 10/28/2021: Liver cirrhosis (HCC) No date: Lung mass 07/03/2022: Necrotizing pneumonia (HCC) No date: Peripheral neuropathy     Comment:  in hands and feet 05/02/2023: PUD (peptic ulcer disease) 11/16/2013: Spinal stenosis, lumbar region with neurogenic  claudication No date: Ulceration of intestine  Past Surgical History: 07/09/2011: BREAST LUMPECTOMY; Right 03/20/2022: COLONOSCOPY WITH PROPOFOL ; N/A     Comment:  Procedure: COLONOSCOPY WITH PROPOFOL ;  Surgeon: Unk Corinn Skiff, MD;  Location: [facility] ENDOSCOPY;  Service:               Gastroenterology;  Laterality: N/A; 10/29/2021: ESOPHAGOGASTRODUODENOSCOPY (EGD) WITH PROPOFOL ; N/A     Comment:  Procedure: ESOPHAGOGASTRODUODENOSCOPY (EGD) WITH               PROPOFOL ;  Surgeon: Unk Corinn Skiff, MD;  Location:               [facility] ENDOSCOPY;  Service: Gastroenterology;  Laterality:               N/A; 02/14/2022: ESOPHAGOGASTRODUODENOSCOPY (EGD) WITH  PROPOFOL ; N/A     Comment:  Procedure: ESOPHAGOGASTRODUODENOSCOPY (EGD) WITH               PROPOFOL ;  Surgeon: Unk Corinn Skiff, MD;  Location:               [facility] ENDOSCOPY;  Service: Gastroenterology;  Laterality:               N/A;  DRIVER 5 - 10 MINUTES AWAY No date: History of kyphoplasty 04/26/2023: INTRAMEDULLARY (IM) NAIL INTERTROCHANTERIC; Left     Comment:  Procedure: INTRAMEDULLARY (IM) NAIL INTERTROCHANTERIC;                Surgeon: Cleotilde Barrio, MD;  Location: [facility] ORS;                Service: Orthopedics;  Laterality: Left; 07/08/1994: TUBAL LIGATION No date: Upper right leg  benign tumor removed 07/09/1999: VAGINAL HYSTERECTOMY     Reproductive/Obstetrics negative OB ROS                              Anesthesia Physical Anesthesia Plan  ASA: 3  Anesthesia Plan: General ETT   Post-op Pain Management: Minimal or no pain anticipated   Induction: Intravenous  PONV Risk Score and Plan: 3 and Ondansetron , Dexamethasone  and Treatment may vary due to age or medical condition  Airway Management Planned: Oral ETT  Additional Equipment:   Intra-op Plan:   Post-operative Plan: Extubation in OR  Informed Consent: I have reviewed the patients History and Physical, chart, labs and discussed the procedure including the risks, benefits and alternatives for the proposed anesthesia with the patient or authorized representative who has indicated his/her understanding and acceptance.     Dental Advisory Given  Plan Discussed with: Anesthesiologist, CRNA and Surgeon  Anesthesia Plan Comments: (Patient consented for risks of anesthesia including but not limited to:  - adverse reactions to medications - damage to eyes, teeth, lips or other oral mucosa - nerve damage due to positioning  - sore throat or hoarseness - Damage to heart, brain, nerves, lungs, other parts of body or loss of life  Patient voiced understanding and assent.)         Anesthesia Quick Evaluation

## 2024-05-07 NOTE — Anesthesia Procedure Notes (Signed)
 Procedure Name: Intubation Date/Time: 05/07/2024 12:28 PM  Performed by: Elly Pfeiffer, CRNAPre-anesthesia Checklist: Patient identified, Emergency Drugs available, Suction available and Patient being monitored Patient Re-evaluated:Patient Re-evaluated prior to induction Oxygen  Delivery Method: Circle system utilized Preoxygenation: Pre-oxygenation with 100% oxygen  Induction Type: IV induction Ventilation: Mask ventilation without difficulty Laryngoscope Size: McGrath and 4 Grade View: Grade I Tube type: Oral Tube size: 8.5 mm Number of attempts: 1 Airway Equipment and Method: Stylet and Oral airway Placement Confirmation: ETT inserted through vocal cords under direct vision, positive ETCO2 and breath sounds checked- equal and bilateral Secured at: 20 cm Tube secured with: Tape Dental Injury: Teeth and Oropharynx as per pre-operative assessment  Comments: Cords clear; no trauma. CA

## 2024-05-07 NOTE — Transfer of Care (Signed)
 Immediate Anesthesia Transfer of Care Note  Patient: Regina Bernard  Procedure(s) Performed: VIDEO BRONCHOSCOPY WITH ENDOBRONCHIAL NAVIGATION BRONCHOSCOPY, WITH EBUS  Patient Location: PACU  Anesthesia Type:General  Level of Consciousness: drowsy  Airway & Oxygen  Therapy: Patient Spontanous Breathing and Patient connected to nasal cannula oxygen   Post-op Assessment: Report given to RN and Post -op Vital signs reviewed and stable  Post vital signs: Reviewed and stable  Last Vitals:  Vitals Value Taken Time  BP 143/81 05/07/24 13:40  Temp 36.2 C 05/07/24 13:40  Pulse 85 05/07/24 13:45  Resp 21 05/07/24 13:45  SpO2 99 % 05/07/24 13:45  Vitals shown include unfiled device data.  Last Pain:  Vitals:   05/07/24 1340  TempSrc:   PainSc: 0-No pain         Complications: No notable events documented.

## 2024-05-07 NOTE — H&P (Signed)
 PULMONOLOGY         Date: 05/07/2024,   MRN# [id number] Regina Bernard 10-Jul-1955    CHIEF COMPLAINT:   Cavitary left lung lesion    HISTORY OF PRESENT ILLNESS   This is a 68 year old female with a history of AKI, asthma, breast cancer history on the right side, chronic pain syndrome COPD endometriosis, recent hospitalization for pneumonia with repeat CT chest showing expanding left lung cavitary process concerning for possible carcinoma.  Patient is here today for bronchoscopic airway examination, therapeutic aspiration of noted mucous plugging, bronchoalveolar lavage, navigational bronchoscopy with lung biopsies of the left cavitary lesion, endobronchial ultrasound evaluation of lymph node stations.  Will be utilizing robotic navigational Ion platform and radial EBUS for lesion confirmation as well as 3D GE live fluoroscopy for 2 and lesion confirmation prior to biopsy.  We discussed procedure in detail with no new changes and patient is agreeable to proceed. Reviewed risks/complications and benefits with patient, risks include infection, pneumothorax/pneumomediastinum which may require chest tube placement as well as overnight/prolonged hospitalization and possible mechanical ventilation. Other risks include bleeding and very rarely death.  Patient understands risks and wishes to proceed.  Additional questions were answered, and patient is aware that post procedure patient will be going home with family and may experience cough with possible clots on expectoration as well as phlegm which may last few days as well as hoarseness of voice post intubation and mechanical ventilation.    PAST MEDICAL HISTORY   Past Medical History:  Diagnosis Date   Abnormal CT scan, chest    Acute hypoxic respiratory failure (HCC) 06/28/2022   AKI (acute kidney injury) 06/28/2022   Anxiety    Aspiration pneumonia (HCC) 06/28/2022   Asthma    Breast cancer (HCC)    Breast CA- Right    Breast  cancer (HCC) 06/18/2011   right breast cancer   Carcinoma of overlapping sites of right breast in female, estrogen receptor positive (HCC) 04/18/2016   Chronic pain syndrome 12/24/2022   Chronic respiratory failure with hypoxia (HCC) 05/01/2023   Closed left hip fracture (HCC) 04/25/2023   Compression fracture of L1 lumbar vertebra (HCC) 12/24/2022   COPD (chronic obstructive pulmonary disease) (HCC)    Depression    Endometriosis    tx with vaginal hysterectomy   Fatty liver    Foraminal stenosis of lumbar region 12/24/2022   Hepatitis C 1998   [facility] trial treatment in-patient study 2005. HCV 740, 05/20/11.   History of Helicobacter pylori infection    History of substance abuse (HCC)    Heroine, cocaine, marijuana. States all of them. Heavy use 1995 to 2000. Occasional use before and after. None since 2006.    Hot flashes related to aromatase inhibitor therapy    Hypertension    Liver cirrhosis (HCC) 10/28/2021   Lung mass    Necrotizing pneumonia (HCC) 07/03/2022   Peripheral neuropathy    in hands and feet   PUD (peptic ulcer disease) 05/02/2023   Spinal stenosis, lumbar region with neurogenic claudication 11/16/2013   Ulceration of intestine      SURGICAL HISTORY   Past Surgical History:  Procedure Laterality Date   BREAST LUMPECTOMY Right 07/09/2011   COLONOSCOPY WITH PROPOFOL  N/A 03/20/2022   Procedure: COLONOSCOPY WITH PROPOFOL ;  Surgeon: Unk Corinn Skiff, MD;  Location: [facility];  Service: Gastroenterology;  Laterality: N/A;   ESOPHAGOGASTRODUODENOSCOPY (EGD) WITH PROPOFOL  N/A 10/29/2021   Procedure: ESOPHAGOGASTRODUODENOSCOPY (EGD) WITH PROPOFOL ;  Surgeon: Unk Corinn Skiff,  MD;  Location: [facility];  Service: Gastroenterology;  Laterality: N/A;   ESOPHAGOGASTRODUODENOSCOPY (EGD) WITH PROPOFOL  N/A 02/14/2022   Procedure: ESOPHAGOGASTRODUODENOSCOPY (EGD) WITH PROPOFOL ;  Surgeon: Unk Corinn Skiff, MD;  Location: [facility];  Service:  Gastroenterology;  Laterality: N/A;  DRIVER 5 - 10 MINUTES AWAY   History of kyphoplasty     INTRAMEDULLARY (IM) NAIL INTERTROCHANTERIC Left 04/26/2023   Procedure: INTRAMEDULLARY (IM) NAIL INTERTROCHANTERIC;  Surgeon: Cleotilde Barrio, MD;  Location: [facility];  Service: Orthopedics;  Laterality: Left;   TUBAL LIGATION  07/08/1994   Upper right leg benign tumor removed     VAGINAL HYSTERECTOMY  07/09/1999     FAMILY HISTORY   Family History  Problem Relation Age of Onset   Diabetes Maternal Grandmother    Lung cancer Father    Lung cancer Mother    Hypertension Mother    Heart disease Mother    Skin cancer Mother    Breast cancer Neg Hx      SOCIAL HISTORY   Social History   Tobacco Use   Smoking status: Every Day    Types: E-cigarettes   Smokeless tobacco: Never   Tobacco comments:    last cigeratte use 6 days ago (11/30/15)  Vaping Use   Vaping status: Every Day   Substances: Nicotine   Substance Use Topics   Alcohol use: Yes    Comment: occ   Drug use: No     MEDICATIONS      Current Medication:  Current Facility-Administered Medications:    lactated ringers  infusion, , Intravenous, Continuous, Mazzoni, Andrea, MD, Last Rate: 10 mL/hr at 05/07/24 1120, New Bag at 05/07/24 1120    ALLERGIES   Penicillins     REVIEW OF SYSTEMS    Review of Systems:  Gen:  Denies  fever, sweats, chills weigh loss  HEENT: Denies blurred vision, double vision, ear pain, eye pain, hearing loss, nose bleeds, sore throat Cardiac:  No dizziness, chest pain or heaviness, chest tightness,edema Resp:   reports dyspnea chronically  Gi: Denies swallowing difficulty, stomach pain, nausea or vomiting, diarrhea, constipation, bowel incontinence Gu:  Denies bladder incontinence, burning urine Ext:   Denies Joint pain, stiffness or swelling Skin: Denies  skin rash, easy bruising or bleeding or hives Endoc:  Denies polyuria, polydipsia , polyphagia or weight change Psych:    Denies depression, insomnia or hallucinations   Other:  All other systems negative   VS: BP 123/68 (BP Location: Right Arm)   Temp (!) 97 F (36.1 C) (Temporal)   Resp 16   SpO2 100%      PHYSICAL EXAM    GENERAL:NAD, no fevers, chills, no weakness no fatigue HEAD: Normocephalic, atraumatic.  EYES: Pupils equal, round, reactive to light. Extraocular muscles intact. No scleral icterus.  MOUTH: Moist mucosal membrane. Dentition intact. No abscess noted.  EAR, NOSE, THROAT: Clear without exudates. No external lesions.  NECK: Supple. No thyromegaly. No nodules. No JVD.  PULMONARY: decreased breath sounds with mild rhonchi worse at bases bilaterally.  CARDIOVASCULAR: S1 and S2. Regular rate and rhythm. No murmurs, rubs, or gallops. No edema. Pedal pulses 2+ bilaterally.  GASTROINTESTINAL: Soft, nontender, nondistended. No masses. Positive bowel sounds. No hepatosplenomegaly.  MUSCULOSKELETAL: No swelling, clubbing, or edema. Range of motion full in all extremities.  NEUROLOGIC: Cranial nerves II through XII are intact. No gross focal neurological deficits. Sensation intact. Reflexes intact.  SKIN: No ulceration, lesions, rashes, or cyanosis. Skin warm and dry. Turgor intact.  PSYCHIATRIC: Mood, affect within normal  limits. The patient is awake, alert and oriented x 3. Insight, judgment intact.       IMAGING   Study Result  Narrative & Impression  CLINICAL DATA:  Hypoxia   EXAM: CT CHEST WITHOUT CONTRAST   TECHNIQUE: Multidetector CT imaging of the chest was performed following the standard protocol without IV contrast.   RADIATION DOSE REDUCTION: This exam was performed according to the departmental dose-optimization program which includes automated exposure control, adjustment of the mA and/or kV according to patient size and/or use of iterative reconstruction technique.   COMPARISON:  04/25/2023, chest x-ray from 2 days previous   FINDINGS: Cardiovascular: Somewhat  limited due to lack of IV contrast. Atherosclerotic calcifications of the thoracic aorta are noted. No aneurysmal dilatation is seen. Coronary calcifications are noted.   Mediastinum/Nodes: Thoracic inlet is within normal limits. No hilar or mediastinal adenopathy is noted. The esophagus as visualized is within normal limits.   Lungs/Pleura: The right lung demonstrates apical pleural and parenchymal scarring. Severe emphysematous changes are identified. Chronic appearing fibrotic changes are noted in the right lung base, increased when compared with the prior exam. No focal confluent infiltrate is seen. Persistent scarring is noted in the left upper lobe with retraction and cavitation. The cavitary area has enlarged in size somewhat when compared with the prior exam although no findings to suggest superinfection are identified. Diffuse emphysematous changes are noted.   Upper Abdomen: Nodularity of the liver is noted consistent with underlying cirrhosis. No other focal upper abdominal abnormality is noted.   Musculoskeletal: Degenerative changes of the thoracic spine are seen. Scoliosis is noted. Changes of prior vertebral augmentation are seen.   IMPRESSION: Increase in fibrotic changes in the right lung base which corresponds to that seen on recent plain film. No definitive acute infiltrate is seen.   Chronic scarring in the left lung superiorly. The cavitary area has increased in size although no findings to suggest superinfection are noted.   Pulmonary emphysema is present. Pulmonary emphysema is an independent risk factor for lung cancer. Recommend patient be considered for lung cancer screening. [place]  [employer]. Screening for Lung Cancer: [place]  Licensed [employer]. JAMA. 2021;325(10):[phone].   Aortic Atherosclerosis (ICD10-I70.0) and Emphysema (ICD10-J43.9).     Electronically Signed   By: Oneil Devonshire M.D.   On:  04/15/2024 19:57    ASSESSMENT/PLAN     Left lower lobe enlarging cavitary lung lesion with mucus plugging and hilar adenopathy  Patient is here today for bronchoscopic airway examination, therapeutic aspiration of noted mucous plugging, bronchoalveolar lavage, navigational bronchoscopy with lung biopsies of the left cavitary lesion, endobronchial ultrasound evaluation of lymph node stations.  Will be utilizing robotic navigational Ion platform and radial EBUS for lesion confirmation as well as 3D GE live fluoroscopy for 2 and lesion confirmation prior to biopsy.  We discussed procedure in detail with no new changes and patient is agreeable to proceed. Reviewed risks/complications and benefits with patient, risks include infection, pneumothorax/pneumomediastinum which may require chest tube placement as well as overnight/prolonged hospitalization and possible mechanical ventilation. Other risks include bleeding and very rarely death.  Patient understands risks and wishes to proceed.  Additional questions were answered, and patient is aware that post procedure patient will be going home with family and may experience cough with possible clots on expectoration as well as phlegm which may last few days as well as hoarseness of voice post intubation and mechanical ventilation.        Thank  you for allowing me to participate in the care of this patient.   Patient/Family are satisfied with care plan and all questions have been answered.    Provider disclosure: Patient with at least one acute or chronic illness or injury that poses a threat to life or bodily function and is being managed actively during this encounter.  All of the below services have been performed independently by signing provider:  review of prior documentation from internal and or external health records.  Review of previous and current lab results.  Interview and comprehensive assessment during patient visit today. Review of  current and previous chest radiographs/CT scans. Discussion of management and test interpretation with health care team and patient/family.   This document was prepared using Dragon voice recognition software and may include unintentional dictation errors.     Vick Filter, M.D.  Division of Pulmonary & Critical Care Medicine

## 2024-05-07 NOTE — Anesthesia Postprocedure Evaluation (Signed)
 Anesthesia Post Note  Patient: Regina Bernard  Procedure(s) Performed: VIDEO BRONCHOSCOPY WITH ENDOBRONCHIAL NAVIGATION BRONCHOSCOPY, WITH EBUS  Patient location during evaluation: PACU Anesthesia Type: General Level of consciousness: awake and alert Pain management: pain level controlled Vital Signs Assessment: post-procedure vital signs reviewed and stable Respiratory status: spontaneous breathing, nonlabored ventilation, respiratory function stable and patient connected to nasal cannula oxygen  Cardiovascular status: blood pressure returned to baseline and stable Postop Assessment: no apparent nausea or vomiting Anesthetic complications: no   No notable events documented.   Last Vitals:  Vitals:   05/07/24 1415 05/07/24 1441  BP: 121/68 (!) 120/55  Pulse: 78 84  Resp: 16 17  Temp: (!) 36.1 C (!) 36.3 C  SpO2: 98% 96%    Last Pain:  Vitals:   05/07/24 1441  TempSrc:   PainSc: 0-No pain                 [name]

## 2024-05-08 ENCOUNTER — Encounter: Payer: Self-pay | Admitting: Pulmonary Disease

## 2024-05-10 ENCOUNTER — Encounter: Payer: Self-pay | Admitting: Pulmonary Disease

## 2024-05-10 LAB — CULTURE, BAL-QUANTITATIVE W GRAM STAIN: Culture: >=100000 — AB

## 2024-05-11 LAB — SURGICAL PATHOLOGY

## 2024-05-11 LAB — CYTOLOGY - NON PAP

## 2024-05-13 LAB — ASPERGILLUS ANTIGEN, BAL/SERUM: Aspergillus Ag, BAL/Serum: 0.97 {index} — ABNORMAL HIGH (ref 0.00–0.49)

## 2024-05-28 LAB — CULTURE, FUNGUS WITHOUT SMEAR

## 2024-06-24 LAB — ACID FAST ID BY PCR AND SUSCEPTIBILITIES
M Tuberculosis Complex: NEGATIVE
M avium complex: POSITIVE — AB

## 2024-06-24 LAB — MAC SUSCEPTIBILITY BROTH
Amikacin: 8
Ciprofloxacin: >8
Clarithromycin: 2
Doxycycline: >8
Minocycline: >8
Moxifloxacin: 4
Rifabutin: 0.5
Rifampin: 4
Streptomycin: 32

## 2024-06-24 LAB — ACID FAST SMEAR (AFB, MYCOBACTERIA): Acid Fast Smear: NEGATIVE

## 2024-06-24 LAB — ACID FAST CULTURE WITH REFLEXED SENSITIVITIES (MYCOBACTERIA): Acid Fast Culture: POSITIVE — AB
